# Patient Record
Sex: Male | Born: 1950 | Race: White | Hispanic: No | Marital: Married | Smoking: Never smoker
Health system: Southern US, Community
[De-identification: ages and names within clinical notes are randomized; demographics above are authoritative.]

## PROBLEM LIST (undated history)

## (undated) DIAGNOSIS — K4090 Unilateral inguinal hernia, without obstruction or gangrene, not specified as recurrent: Secondary | ICD-10-CM

## (undated) DIAGNOSIS — E785 Hyperlipidemia, unspecified: Secondary | ICD-10-CM

## (undated) DIAGNOSIS — G4730 Sleep apnea, unspecified: Secondary | ICD-10-CM

## (undated) DIAGNOSIS — I10 Essential (primary) hypertension: Secondary | ICD-10-CM

## (undated) DIAGNOSIS — M1990 Unspecified osteoarthritis, unspecified site: Secondary | ICD-10-CM

## (undated) DIAGNOSIS — Z87442 Personal history of urinary calculi: Secondary | ICD-10-CM

## (undated) HISTORY — PX: TONSILLECTOMY: SUR1361

## (undated) HISTORY — PX: KNEE ARTHROSCOPY: SUR90

## (undated) HISTORY — PX: SHOULDER ARTHROSCOPY: SHX128

## (undated) HISTORY — PX: COLONOSCOPY: SHX174

---

## 1998-02-27 ENCOUNTER — Ambulatory Visit (HOSPITAL_BASED_OUTPATIENT_CLINIC_OR_DEPARTMENT_OTHER): Admission: RE | Admit: 1998-02-27 | Discharge: 1998-02-27 | Payer: Self-pay | Admitting: Orthopedic Surgery

## 2000-01-30 ENCOUNTER — Encounter: Payer: Self-pay | Admitting: Urology

## 2000-01-30 ENCOUNTER — Encounter: Admission: RE | Admit: 2000-01-30 | Discharge: 2000-01-30 | Payer: Self-pay | Admitting: Urology

## 2000-02-26 ENCOUNTER — Encounter: Admission: RE | Admit: 2000-02-26 | Discharge: 2000-02-26 | Payer: Self-pay | Admitting: Urology

## 2000-02-26 ENCOUNTER — Encounter: Payer: Self-pay | Admitting: Urology

## 2000-02-28 ENCOUNTER — Ambulatory Visit (HOSPITAL_COMMUNITY): Admission: RE | Admit: 2000-02-28 | Discharge: 2000-02-28 | Payer: Self-pay | Admitting: Urology

## 2000-09-29 ENCOUNTER — Ambulatory Visit (HOSPITAL_BASED_OUTPATIENT_CLINIC_OR_DEPARTMENT_OTHER): Admission: RE | Admit: 2000-09-29 | Discharge: 2000-09-29 | Payer: Self-pay | Admitting: Orthopedic Surgery

## 2001-01-20 ENCOUNTER — Ambulatory Visit (HOSPITAL_BASED_OUTPATIENT_CLINIC_OR_DEPARTMENT_OTHER): Admission: RE | Admit: 2001-01-20 | Discharge: 2001-01-20 | Payer: Self-pay | Admitting: *Deleted

## 2001-05-05 ENCOUNTER — Encounter: Admission: RE | Admit: 2001-05-05 | Discharge: 2001-05-05 | Payer: Self-pay | Admitting: Family Medicine

## 2001-07-29 ENCOUNTER — Encounter: Payer: Self-pay | Admitting: Urology

## 2001-07-29 ENCOUNTER — Encounter: Admission: RE | Admit: 2001-07-29 | Discharge: 2001-07-29 | Payer: Self-pay | Admitting: Urology

## 2001-08-31 ENCOUNTER — Encounter: Payer: Self-pay | Admitting: Urology

## 2001-08-31 ENCOUNTER — Encounter: Admission: RE | Admit: 2001-08-31 | Discharge: 2001-08-31 | Payer: Self-pay | Admitting: Urology

## 2001-11-09 ENCOUNTER — Encounter: Admission: RE | Admit: 2001-11-09 | Discharge: 2001-11-09 | Payer: Self-pay | Admitting: Family Medicine

## 2003-10-04 ENCOUNTER — Encounter: Admission: RE | Admit: 2003-10-04 | Discharge: 2003-10-04 | Payer: Self-pay | Admitting: Sports Medicine

## 2003-11-23 ENCOUNTER — Encounter: Admission: RE | Admit: 2003-11-23 | Discharge: 2003-11-23 | Payer: Self-pay | Admitting: Family Medicine

## 2004-06-20 ENCOUNTER — Ambulatory Visit (HOSPITAL_COMMUNITY): Admission: RE | Admit: 2004-06-20 | Discharge: 2004-06-20 | Payer: Self-pay | Admitting: Family Medicine

## 2005-03-12 ENCOUNTER — Ambulatory Visit (HOSPITAL_COMMUNITY): Admission: RE | Admit: 2005-03-12 | Discharge: 2005-03-12 | Payer: Self-pay | Admitting: Gastroenterology

## 2006-11-28 ENCOUNTER — Encounter: Payer: Self-pay | Admitting: Family Medicine

## 2006-11-28 LAB — CONVERTED CEMR LAB
ALT: 21 units/L (ref 0–53)
AST: 23 units/L (ref 0–37)
Albumin: 4.4 g/dL (ref 3.5–5.2)
Alkaline Phosphatase: 73 units/L (ref 39–117)
BUN: 19 mg/dL (ref 6–23)
CO2: 27 meq/L (ref 19–32)
Calcium: 9.3 mg/dL (ref 8.4–10.5)
Chloride: 102 meq/L (ref 96–112)
Cholesterol: 146 mg/dL (ref 0–200)
Creatinine, Ser: 0.9 mg/dL (ref 0.40–1.50)
Glucose, Bld: 91 mg/dL (ref 70–99)
HDL: 47 mg/dL (ref >39)
LDL Cholesterol: 86 mg/dL (ref 0–99)
PSA: 0.44 ng/mL (ref 0.10–4.00)
Potassium: 4.1 meq/L (ref 3.5–5.3)
Sodium: 139 meq/L (ref 135–145)
TSH: 1.282 microintl units/mL (ref 0.350–5.50)
Total Bilirubin: 0.8 mg/dL (ref 0.3–1.2)
Total CHOL/HDL Ratio: 3.1
Total Protein: 7.3 g/dL (ref 6.0–8.3)
Triglycerides: 65 mg/dL (ref <150)
VLDL: 13 mg/dL (ref 0–40)

## 2007-04-06 ENCOUNTER — Encounter: Payer: Self-pay | Admitting: Family Medicine

## 2007-04-06 LAB — CONVERTED CEMR LAB
AST: 26 units/L (ref 0–37)
Albumin: 4.1 g/dL (ref 3.5–5.2)
Alkaline Phosphatase: 73 units/L (ref 39–117)
BUN: 14 mg/dL (ref 6–23)
Calcium: 9.3 mg/dL (ref 8.4–10.5)
Creatinine, Ser: 0.96 mg/dL (ref 0.40–1.50)
Glucose, Bld: 105 mg/dL — ABNORMAL HIGH (ref 70–99)
HDL: 49 mg/dL (ref >39)
LDL Cholesterol: 79 mg/dL (ref 0–99)
Potassium: 4.4 meq/L (ref 3.5–5.3)
Total CHOL/HDL Ratio: 3.1
Triglycerides: 109 mg/dL (ref <150)

## 2007-10-01 ENCOUNTER — Encounter: Payer: Self-pay | Admitting: Family Medicine

## 2007-10-01 LAB — CONVERTED CEMR LAB
ALT: 16 units/L (ref 0–53)
AST: 21 units/L (ref 0–37)
Alkaline Phosphatase: 70 units/L (ref 39–117)
Basophils Absolute: 0.1 10*3/uL (ref 0.0–0.1)
Basophils Relative: 1 % (ref 0–1)
Cholesterol: 137 mg/dL (ref 0–200)
Creatinine, Ser: 0.9 mg/dL (ref 0.40–1.50)
Eosinophils Relative: 2 % (ref 0–5)
HCT: 47.4 % (ref 39.0–52.0)
Hemoglobin: 15.3 g/dL (ref 13.0–17.0)
LDL Cholesterol: 76 mg/dL (ref 0–99)
MCHC: 32.3 g/dL (ref 30.0–36.0)
Monocytes Absolute: 0.8 10*3/uL (ref 0.1–1.0)
PSA: 0.52 ng/mL (ref 0.10–4.00)
Platelets: 223 10*3/uL (ref 150–400)
RDW: 13.2 % (ref 11.5–15.5)
Sodium: 143 meq/L (ref 135–145)
TSH: 1.351 microintl units/mL (ref 0.350–5.50)
Total Bilirubin: 0.8 mg/dL (ref 0.3–1.2)
Total CHOL/HDL Ratio: 3
VLDL: 16 mg/dL (ref 0–40)

## 2007-11-08 ENCOUNTER — Ambulatory Visit (HOSPITAL_BASED_OUTPATIENT_CLINIC_OR_DEPARTMENT_OTHER): Admission: RE | Admit: 2007-11-08 | Discharge: 2007-11-08 | Payer: Self-pay | Admitting: Internal Medicine

## 2007-11-15 ENCOUNTER — Ambulatory Visit: Payer: Self-pay | Admitting: Internal Medicine

## 2007-11-20 ENCOUNTER — Encounter: Payer: Self-pay | Admitting: Family Medicine

## 2007-11-20 LAB — CONVERTED CEMR LAB
Albumin: 4.5 g/dL (ref 3.5–5.2)
Alkaline Phosphatase: 79 units/L (ref 39–117)
Total Protein: 7.4 g/dL (ref 6.0–8.3)

## 2007-12-23 ENCOUNTER — Ambulatory Visit (HOSPITAL_BASED_OUTPATIENT_CLINIC_OR_DEPARTMENT_OTHER): Admission: RE | Admit: 2007-12-23 | Discharge: 2007-12-23 | Payer: Self-pay | Admitting: Internal Medicine

## 2007-12-26 ENCOUNTER — Ambulatory Visit: Payer: Self-pay | Admitting: Internal Medicine

## 2007-12-28 ENCOUNTER — Encounter: Payer: Self-pay | Admitting: Family Medicine

## 2009-02-23 ENCOUNTER — Ambulatory Visit (HOSPITAL_BASED_OUTPATIENT_CLINIC_OR_DEPARTMENT_OTHER): Admission: RE | Admit: 2009-02-23 | Discharge: 2009-02-23 | Payer: Self-pay | Admitting: Urology

## 2009-02-27 ENCOUNTER — Encounter: Payer: Self-pay | Admitting: Family Medicine

## 2009-02-27 LAB — CONVERTED CEMR LAB
Chloride: 106 meq/L (ref 96–112)
Creatinine, Ser: 0.94 mg/dL (ref 0.40–1.50)
GFR calc non Af Amer: >60 mL/min (ref >60)
Sodium: 140 meq/L (ref 135–145)

## 2010-06-26 ENCOUNTER — Ambulatory Visit: Payer: Self-pay | Admitting: Sports Medicine

## 2010-06-26 ENCOUNTER — Ambulatory Visit: Payer: Self-pay | Admitting: Family Medicine

## 2010-06-26 DIAGNOSIS — R5383 Other fatigue: Secondary | ICD-10-CM

## 2010-06-26 DIAGNOSIS — M5137 Other intervertebral disc degeneration, lumbosacral region: Secondary | ICD-10-CM

## 2010-06-26 DIAGNOSIS — R5381 Other malaise: Secondary | ICD-10-CM

## 2010-06-26 DIAGNOSIS — M79609 Pain in unspecified limb: Secondary | ICD-10-CM

## 2010-07-03 LAB — CONVERTED CEMR LAB
CO2: 26 meq/L (ref 19–32)
Calcium: 9.4 mg/dL (ref 8.4–10.5)
Sodium: 142 meq/L (ref 135–145)
Total CK: 162 units/L (ref 7–232)

## 2010-08-07 ENCOUNTER — Ambulatory Visit: Payer: Self-pay | Admitting: Sports Medicine

## 2010-08-07 DIAGNOSIS — Q667 Congenital pes cavus, unspecified foot: Secondary | ICD-10-CM | POA: Insufficient documentation

## 2010-12-18 NOTE — Assessment & Plan Note (Signed)
Summary: 9:30 APPT,B LEG WEAKNESS PER [facility]   Vital Signs:  Patient profile:   60 year old male Height:      70 inches Weight:      200 pounds BMI:     28.80 BP sitting:   146 / 84  CC:  Leg Weakness.Marland Kitchen  History of Present Illness: 60 yo M:  1. Leg Weakness: generalized, vague, bilateral upper leg weakness, worsening over past several months. + PMHx of lumbar DDD/pain. Reports doing abdominal crunches about 6-8 months ago, then had radicular pain down right leg - now resolved. Now feels bilateral leg weakness that is worse when he stands up from a seated position. No knee, low back, or hip pain now. States that he is on Zocor 40 mg by mouth daily x 15 years. Wonders if it is the etiology.  Current Medications (verified): 1)  Epipen 2-Pak 0.3 Mg/0.81ml (1:1000)  Devi (Epinephrine Hcl (Anaphylaxis)) .... Inject As Soon As Possible After Sting 2)  Simvastatin 40 Mg Tabs (Simvastatin) .... Take One Tablet At Bedtime 3)  Aspirin 81 Mg  Tbec (Aspirin) .... One By Mouth Every Day  Allergies (verified): No Known Drug Allergies  Review of Systems GU:  Denies incontinence. MS:  Complains of loss of strength, low back pain, and muscle weakness; denies joint pain, joint redness, and joint swelling. Neuro:  Denies falling down, numbness, and tingling.  Physical Exam  General:  Well-developed, well-nourished, in no acute distress; alert, appropriate and cooperative throughout examination. Vitals reviewed. Msk:  Back: No obvious abnormalities or asymmetry. No point tenderness to lumbar spine, paraspinal mm, hips, piriformis. Normal lumbar and hip ROM. Neg SLR. Neg FABER. WEAK hip flexion and abduction left >> right. Decreased patellar tendon reflex on left. Knees: No effusion. No point tenderness. Normal ROM. Neg McMurrays. Normal endpoints.  Neurologic:  Sensation intact to light touch and gait normal.     Impression & Recommendations:  Problem # 1:  DEGENERATIVE DISC DISEASE, LUMBAR  SPINE (ICD-722.52) Assessment New Will continue on easy LB stretches and exercises  if no response could consider gabapentin  Problem # 2:  WEAKNESS (ICD-780.79) Assessment: New Especially hip flexion and abduction - starting to interfere with lifestyle. Likely 2/2 to DDD flare with radiculopathy after crunches 6-8 months ago which led the patient to rest from core exercises. Patient weaker on the side of previous radicular pain. Will Rx PT for weakness. No RED FLAGs today indicating need for further imaging. Will check electrolytes and CK (unlikely that Zocor contributing to issue). Follow up in 6-8 weeks for re-eval.  Problem # 3:  LEG PAIN, BILATERAL (ICD-729.5)  Orders: Basic Met-[facility] ([address]) CK (Creatine Kinase)-[facility] [phone])  This does seem referred from back area but is much more significant left RT may be f compensation  Complete Medication List: 1)  Epipen 2-pak 0.3 Mg/0.75ml (1:1000) Devi (Epinephrine hcl (anaphylaxis)) .... Inject as soon as possible after sting 2)  Simvastatin 40 Mg Tabs (Simvastatin) .... Take one tablet at bedtime 3)  Aspirin 81 Mg Tbec (Aspirin) .... One by mouth every day Prescriptions: SIMVASTATIN 40 MG TABS (SIMVASTATIN) Take one tablet at bedtime  #90 x 3   Entered by:   Helane Rima DO   Authorized by:   Enid Baas MD   Signed by:   Helane Rima DO on 06/26/2010   Method used:   Historical   RxID:   [id number]

## 2010-12-18 NOTE — Assessment & Plan Note (Signed)
Summary: F/U,MC   Vital Signs:  Patient profile:   60 year old male Pulse rate:   71 / minute BP sitting:   150 / 70  (left arm)  Vitals Entered By: Lillia Pauls CMA (August 07, 2010 8:41 AM) CC: f/u L knee weakness   CC:  f/u L knee weakness.  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  note he had RT arch pain p lot of step up exercises  Allergies: No Known Drug Allergies  Physical Exam  General:  Well-developed,well-nourished,in no acute distress; alert,appropriate and cooperative throughout examination Msk:  Cavus foot wi lots of callus bilat  neg SLR and Low back exam  left hip abduction, flexion and rotation strength is now 90 to 100% of RT side  this is big improvement   Impression & Recommendations:  Problem # 1:  DEGENERATIVE DISC DISEASE, LUMBAR SPINE (ICD-722.52) keep up back stretches and particularly 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  Problem # 2:  WEAKNESS (ICD-780.79) much improved  would keep up exercises at least 3x week should see steady drop in sxs next 2 mos if not we will reck  Problem # 3:  LEG PAIN, BILATERAL (ICD-729.5)  this is less and really has resolved on RT  Orders: Sports Insoles (L3510)  Problem # 4:  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 Inject as soon as possible after sting 2)  Simvastatin 40 Mg Tabs (Simvastatin) .... Take one tablet at  bedtime 3)  Aspirin 81 Mg Tbec (Aspirin) .... One by mouth every day  Patient Instructions: 1)  Try to keep up some hip and step exercises at least 3 x per week 2)  add some knee to chest and knee to opposite shoulder stretches - try 3 and hold for 15 to 30 secs 3)  this is triggered by low back degenerative change but you don't have any severe compression as strength is now back to normal 4)  avoid knee bend greater than 90 deg while working as this irritates cartilage 5)  recheck with me as needed but expect change over the next 2 mosnths

## 2010-12-18 NOTE — Letter (Signed)
Summary: Generic Letter  [facility]  [address]   [place], [place] [address]   Phone: [phone]  Fax: [phone]    08/07/2010 Johnella Moloney, MD Fax [phone]  Dear Duane Greene:  I recently saw your patient:  Duane Greene [address] [place], [place]  [address]  I have seen [name] a number of times over past 20 years for musculoskeletal injuries.  He came to me with some back and left leg pain.  Attached are his office visits.         Sincerely,  Vincent Gros MD

## 2011-04-02 NOTE — Procedures (Signed)
NAME:  Duane Greene, Duane Greene                ACCOUNT NO.:  [id number]   MEDICAL RECORD NO.:  [id number]          PATIENT TYPE:  OUT   LOCATION:  SLEEP CENTER                 FACILITY:  [facility]   PHYSICIAN:  Clinton D. Maple Hudson, MD, FCCP, FACPDATE OF BIRTH:  March 06[birth year] 1950   DATE OF STUDY:  12/23/2007                            NOCTURNAL POLYSOMNOGRAM   REFERRING PHYSICIAN:  Candyce Churn, M.D.   INDICATION FOR STUDY:  Hypersomnia with sleep apnea.   Epworth sleepiness score 13/24.  BMI 30.6.  Weight 207 pounds.  Height  69 inches.  Neck 15.5 inches.   HOME MEDICATIONS:  Charted and reviewed.   The baseline diagnostic NPSG was done November 08, 2007, recording an  AHI of 23.6 per hour.  CPAP titration is requested.   SLEEP ARCHITECTURE:  Total sleep time 312 minutes with sleep efficiency  77%.  Stage 1 was 10.6%, stage 2 was 70.2%, stage 3 absent, REM 19.2% of  total sleep time.  Sleep latency 7 minutes, REM latency 47.5 minutes.  Awake after sleep onset 77 minutes.  Arousal index 9.2.  No bedtime  medication was taken.   RESPIRATORY DATA:  CPAP titration protocol.  CPAP was titrated to 7 CWP,  AHI 0 per hour.  He chose a small Respironics ComfortGel nasal mask with  heated humidifier.   OXYGEN DATA:  Snoring was prevented by CPAP and oxygen saturation held  at a mean of 94.9% on room air.   CARDIAC DATA:  Sinus rhythm with occasional PVC.   MOVEMENT/PARASOMNIA:  No significant movement disturbance.  Bathroom x1.   IMPRESSION/RECOMMENDATION:  1. Successful CPAP titration to 7 CWP, AHI 0 per hour.  He chose a      small Respironics ComfortGel nasal mask with heated humidifier.  2. Baseline diagnostic nocturnal polysomnogram on November 08, 2007      recorded an AHI of 23.6 per hour.     Clinton D. Maple Hudson, MD, [facility], FACP  Diplomate, Biomedical engineer of Sleep Medicine  Electronically Signed    CDY/MEDQ  D:  12/26/2007 17:32:09  T:  12/28/2007 11:01:29  Job:  [id number]

## 2011-04-02 NOTE — Procedures (Signed)
NAME:  Duane Greene, Duane Greene                ACCOUNT NO.:  [id number]   MEDICAL RECORD NO.:  [id number]          PATIENT TYPE:  OUT   LOCATION:  SLEEP CENTER                 FACILITY:  [facility]   PHYSICIAN:  Clinton D. Maple Hudson, MD, FCCP, FACPDATE OF BIRTH:  [birth year] 1950   DATE OF STUDY:  11/08/2007                            NOCTURNAL POLYSOMNOGRAM   REFERRING PHYSICIAN:  Candyce Churn, M.D.   INDICATION FOR STUDY:  Hypersomnia with sleep apnea.  BMI 30.6, weight  207 pounds, height 69 inches.   EPWORTH SLEEPINESS SCORE:  10/24   MEDICATIONS:  Home medication charted and reviewed.   SLEEP ARCHITECTURE:  Total sleep time 325 minutes with sleep efficiency  74%.  Stage I was 12%, stage II 78%, and stage III absent, REM 8% of  total sleep time.  Sleep latency 9 minutes, REM latency 105 minutes.  Awake after sleep onset 104 minutes.  Arousal index 9.6.  No bedtime  medication was reported.   RESPIRATORY DATA:  Diagnostic NPSG protocol.  Apnea/hypopnea index (AHI)  23.6 per hour, RDI 25.1 per hour.  There were a total of 128 obstructive  events including one obstructive apnea and 127 hypopneas.  Most events  were recorded while sleeping on sides.   OXYGEN DATA:  Loud snoring with oxygen desaturation to a nadir of 73%.  Mean oxygen saturation through the study was 92.8% on room air.  Oxygen  saturation was less than 88% for a total of 36 minutes.   CARDIAC DATA:  Normal sinus rhythm with occasional dropped beat.   MOVEMENT-PARASOMNIA:  Minimal periodic limb movement with a total of 8  events, index 1.5 arousals per hour.  One bathroom trip.   IMPRESSIONS-RECOMMENDATIONS:  1. Moderate obstructive sleep apnea/hypopnea syndrome, apnea/hypopnea      index 23.6 per hour (normal range 0-5 per hour).  Events were      mostly recorded while sleeping on sides.  Loud snoring with oxygen      desaturation to a nadir of 73%.  2. Minimal periodic limb movement with arousal, 1.5 per hour.  3. Consider  return for CPAP titration or evaluate for alternative      therapies for sleep apnea as appropriate.      Clinton D. Maple Hudson, MD, [facility], FACP  Diplomate, Biomedical engineer of Sleep Medicine  Electronically Signed     CDY/MEDQ  D:  11/15/2007 11:16:02  T:  11/15/2007 22:43:41  Job:  [id number]

## 2011-04-02 NOTE — Op Note (Signed)
NAME:  Duane Greene, Duane Greene                ACCOUNT NO.:  [id number]   MEDICAL RECORD NO.:  [id number]          PATIENT TYPE:  AMB   LOCATION:  [facility]                         FACILITY:  [facility]   PHYSICIAN:  Sigmund I. Patsi Sears, M.D.DATE OF BIRTH:  [birth year]   DATE OF PROCEDURE:  DATE OF DISCHARGE:                               OPERATIVE REPORT   PREOPERATIVE DIAGNOSIS:  Bladder stone.   POSTOPERATIVE DIAGNOSES:  1. Urethral stricture.  2. Bladder stone.   PROCEDURE PERFORMED:  1. Cystourethroscopy.  2. Urethral dilatation.  3. Cystolitholapaxy using holmium laser.   SURGEON:  Sigmund I. Patsi Sears, M.D.   RESIDENTWynonia Lawman   ANESTHESIA:  General.   DRAINS:  None.   COMPLICATIONS:  None.   INDICATIONS FOR PROCEDURE:  The patient , a 60 year old gentleman with a  history of nephrolithiasis, was recently evaluated with a CT stone study  for abdominal pain and was found to have a 12 x 8 mm bladder stone.  Definite treatment options were discussed.  The patient today was  scheduled for cystolitholapaxy.  The risks and benefits of procedure  were explained.  Informed consent obtained.   DESCRIPTION OF PROCEDURE IN DETAIL:  The patient was brought to the  operating room, placed in the supine position.  Administered general  anesthesia by the anesthesia team.  Proper time out performed __________  patient, procedure, and the site.  The patient was given appropriate  preoperative antibiotics.  The patient was subsequently placed in the  dorsal lithotomy position.  Pressure points were well padded.  The  patient was prepped and draped in the usual sterile manner.  Using the  21-French ACMI cystoscopic sheath and a 12-degree lens, we then  performed cystourethroscopy.  The patient's anterior urethra was normal  up until the junction of bulbar urethra with the membranous urethra.  Just distal to the membranous urethra, there was a slight narrowing seen  in the urethra.  We were  able to negotiate through this narrowing with  the help of cystoscopic sheath.  We then entered the prostatic urethra  which revealed moderate bilobar prostatic enlargement.  Upon entering  the bladder, we saw the stone at the base of the bladder.  It was  mulberry in appearance.  The left ureteric orifice was identified and in  its normal anatomic position.  In the place of the right ureteric  orifice, there was edematous heaped-up  mucosa.  At that point, we gave  the patient indigo carmine.  We also introduced 500 micron laser fiber  and performed laser lithotripsy of the stone.  The stone was broken down  to tiny particles.  These were evacuated using cystoscopic sheath and  Toomey syringe.  We reexamined the bladder with the help of 30-degree  lens.  We saw blue urine seen  effluxing the left and the right ureteric orifice.  We then made the  patient's bladder to empty.  There were no more stone particles.  We  removed the cystoscope.  This marked the end of the procedure.  The  patient was subsequently extubated and transferred in  stable condition  to the recovery room.     ______________________________  Dr. Georgeanna Lea      Sigmund I. Patsi Sears, M.D.  Electronically Signed    JJ/MEDQ  D:  02/23/2009  T:  02/23/2009  Job:  [id number]

## 2011-04-05 NOTE — Op Note (Signed)
[place]  Patient:    Duane Greene, Duane Greene                       MRN: [id number] Proc. Date: 01/20/01 Adm. 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  PROCEDURE:  Left shoulder EUA followed by manipulation and cortisone injection.  SURGEON:  Elana Alm. Thurston Hole, M.D.  ANESTHESIA:  General.  OPERATIVE TIME:  10 minutes.  COMPLICATIONS:  None.  INDICATIONS FOR PROCEDURE:  Mr. Duane Greene is a 60 year old gentleman who is three and a half months status post left shoulder arthroscopy with decompression, who has developed significant adhesive capsulitis despite aggressive physical therapy. Because of this, he is now to undergo examination under anesthesia, as well as manipulation and injection.  DESCRIPTION OF PROCEDURE:  Mr. Duane Greene was brought to the operating room on January 20, 2001 after a block had been placed in the holding room. After being placed under general anesthesia his shoulder was examined. He had forward flexion of 170, abduction of 110, internal rotation of 50, external rotation of 50. A gentle manipulation was carried out breaking up adhesions, especially in external rotation and abduction, such that he regained full abduction to 175, full external rotation to 90, internal rotation to 80 and forward flexion to 180. His shoulder remained stable ligamentous exam. After this was done intraoperative fluoroscopy confirmed that there was no bony injury and his subacromial space appeared satisfactory. Also, noting the [facility] joint area, this appeared to have had a satisfactory distal clavicle excision, which had performed at the time of his surgery three months ago. At this point, then, through using an  alcohol prep the subacromial space was injected with 1 cc of Depo-Medrol and 3 cc of Marcaine. Then the intra-articular portion of the joint was also injected with 1 cc of Depo-Medrol and 3 cc of Marcaine. After this was done, the patient was wakened and taken to the recovery room in stable condition.  FOLLOWUP CARE:  Mr. Duane Greene will be followed as an outpatient on Vicodin for pain. He will start immediate physical therapy tomorrow, intensive therapy. See him back in my office in a week for repeat evaluation. DD:  01/20/01 TD:  01/20/01 Job: [id number]

## 2011-04-05 NOTE — H&P (Signed)
[place]  Patient:    Duane Greene, Duane Greene                       MRN: [id number] Adm. Date:  [id number] Attending:  Twana First                         History and Physical  PREOPERATIVE DIAGNOSIS:  Left shoulder partial rotator cuff tear with tendonitis and impingement.  Left shoulder AC joint arthropathy.  POSTOPERATIVE DIAGNOSIS:  Left shoulder rotator cuff tendonitis and impingment.  Left shoulder partial labrum tear.  Left shoulder AC joint arthropathy.  PROCEDURE:  Left shoulder EUA followed by arthroscopic partial labrum tear debridement.  Left shoulder subacromial decompression.  Left shoulder distal clavicle excision.  SURGEON:  Elana Alm. Thurston Hole, M.D.  ASSISTANT:  Kirstin Adelberger, P.A.  ANESTHESIA:  General anesthesia.  OPERATIVE TIME:  45 minutes.  COMPLICATIONS:  None.  INDICATIONS:  Duane Greene is a 60 year old gentleman who has had significant left shoulder pain for the past six months increasing in nature with signs and symptoms consistent with rotator cuff tendonitis and impingement who has failed conservative care and is now to undergo arthroscopy.  DESCRIPTION OF PROCEDURE:  Duane Greene was brought to the operating room on September 29, 2000, and placed on the operating table in the supine position. After an adequate level of general anesthesia was obtained, his left shoulder was examined under anesthesia.  He had full range of motion.  His shoulder was stable ligamentous examination.  Subacromial space was sterilely injected with 0.25% Marcaine with epinephrine.  He was then placed in a beachchair position and his shoulder and arm was prepped using sterile Betadine and draped using sterile technique.  Originally through a posterior arthroscopic portal, the arthroscope with the pump attached was placed and through an anterior portal an arthroscopic probe was placed.  On initial inspection the articular cartilage and  the glenohumeral joint showed mild grade I and II changes. Anterior labrum superiorly in the midportion showed significant degenerative type tearing of which 30 to 40% was resected back to a stable rim.  The biceps tendon anchor was found to be intact, biceps tendon intact, inferior labrum, and anterior and inferior glenohumeral ligaments were intact.  Posterior labrum showed moderate fraying which was debrided and otherwise it was intact. The rotator cuff was thoroughly inspected, but there was no evidence of a tear. Inferior capsular recess was free of pathology.  Subacromial space was entered and a lateral arthroscopic portal was made.  Moderately thickened bursitis was resected.  After this was done, the rotator cuff was found to be frayed, but no evidence of a tear.  Subacromial decompression was carried out removing 6 to 8 mm of the undersurface of the anterior, anterolateral, and anterior medial acromion and the CA ligament release carried out.  AC joint was exposed.  Significant spurring and degenerative changes noted in the distal 5 mm of the distal clavicle was resected with a 6 mm bur under arthroscopic control and the inferior spur was totally removed.  After this was done, the shoulder could be brought through a full range of motion with no impingement on the rotator cuff.  At this point, it was felt that all pathology had been satisfactorily addressed.  The instruments were removed. Portals were closed with 3-0 nylon suture and injected with 0.25% Marcaine with epinephrine.  Sterile dressings and a sling applied and the  patient awakened and taken to the recovery room in stable condition.  FOLLOW-UP:  Duane Greene will be followed as an outpatient on Vicodin and Naprosyn.  Begin early physical therapy.  See him back in the office in a week for sutures out and follow-up. DD:  09/29/00 TD:  09/29/00 Job: [id number]

## 2011-04-22 ENCOUNTER — Other Ambulatory Visit: Payer: Self-pay | Admitting: Family Medicine

## 2011-04-22 DIAGNOSIS — Z91030 Bee allergy status: Secondary | ICD-10-CM

## 2011-04-22 MED ORDER — EPINEPHRINE 0.3 MG/0.3ML IJ DEVI: 0.3000 mg | Freq: Once | INTRAMUSCULAR | Status: DC

## 2012-05-07 ENCOUNTER — Other Ambulatory Visit: Payer: Self-pay | Admitting: Family Medicine

## 2012-05-07 DIAGNOSIS — Z91030 Bee allergy status: Secondary | ICD-10-CM

## 2012-05-07 MED ORDER — EPINEPHRINE 0.3 MG/0.3ML IJ DEVI: 0.3000 mg | Freq: Once | INTRAMUSCULAR | Status: DC

## 2012-08-21 ENCOUNTER — Ambulatory Visit (INDEPENDENT_AMBULATORY_CARE_PROVIDER_SITE_OTHER): Payer: 59 | Admitting: Family Medicine

## 2012-08-21 VITALS — BP 152/78 | HR 72

## 2012-08-21 DIAGNOSIS — H8110 Benign paroxysmal vertigo, unspecified ear: Secondary | ICD-10-CM | POA: Insufficient documentation

## 2012-08-21 MED ORDER — MECLIZINE HCL 25 MG PO CHEW: 25.0000 mg | CHEWABLE_TABLET | Freq: Four times a day (QID) | ORAL | Status: DC | PRN

## 2012-08-21 MED ORDER — MECLIZINE HCL 25 MG PO TABS: 25.0000 mg | ORAL_TABLET | Freq: Three times a day (TID) | ORAL | Status: DC | PRN

## 2012-08-21 NOTE — Progress Notes (Signed)
  Subjective:    Patient ID: Duane Greene, male    DOB: 1951-09-30, 61 y.o.   MRN: [id number]  HPI 15 minutes of dizziness yesterday AM without nausea. Fine the rest of the day. This AM recurred with diaphoresis worse with head movements. No nausea or vomiting. EMS came and did an EKG that they said was normal. After sitting for 15 min has been able to get up and ambulate. Now feels tired and sleepy.   Review of Systems  Respiratory: Negative for cough, chest tightness and shortness of breath.   Cardiovascular: Negative for chest pain and leg swelling.  Gastrointestinal: Negative for nausea.       Objective:   Physical Exam  Cardiovascular: Normal rate and regular rhythm.   No murmur heard. Pulmonary/Chest: Effort normal and breath sounds normal. No respiratory distress.  Neurological: He is alert. No cranial nerve deficit. Coordination normal.       Rotary nystagmus when he lay back with the head turned to the left but not to the right. No improvement after the first Epley manuever  Psychiatric: He has a normal mood and affect. His behavior is normal.          Assessment & Plan:

## 2012-08-21 NOTE — Patient Instructions (Addendum)
Avoid any activity requiring good balance for a few days. Take the Meclizine for a couple days to minimize the symptoms. Do the exercises if the vertigo persists. Return if you develop headache or weakness or other new symptoms.    Benign Positional Vertigo Vertigo means you feel like you or your surroundings are moving when they are not. Benign positional vertigo is the most common form of vertigo. Benign means that the cause of your condition is not serious. Benign positional vertigo is more common in older adults. CAUSES  Benign positional vertigo is the result of an upset in the labyrinth system. This is an area in the middle ear that helps control your balance. This may be caused by a viral infection, head injury, or repetitive motion. However, often no specific cause is found. SYMPTOMS  Symptoms of benign positional vertigo occur when you move your head or eyes in different directions. Some of the symptoms may include:  Loss of balance and falls.  Vomiting.  Blurred vision.  Dizziness.  Nausea.  Involuntary eye movements (nystagmus). DIAGNOSIS  Benign positional vertigo is usually diagnosed by physical exam. If the specific cause of your benign positional vertigo is unknown, your caregiver may perform imaging tests, such as magnetic resonance imaging (MRI) or computed tomography (CT). TREATMENT  Your caregiver may recommend movements or procedures to correct the benign positional vertigo. Medicines such as meclizine, benzodiazepines, and medicines for nausea may be used to treat your symptoms. In rare cases, if your symptoms are caused by certain conditions that affect the inner ear, you may need surgery. HOME CARE INSTRUCTIONS   Follow your caregiver's instructions.  Move slowly. Do not make sudden body or head movements.  Avoid driving.  Avoid operating heavy machinery.  Avoid performing any tasks that would be dangerous to you or others during a vertigo episode.  Drink  enough fluids to keep your urine clear or pale yellow. SEEK IMMEDIATE MEDICAL CARE IF:   You develop problems with walking, weakness, numbness, or using your arms, hands, or legs.  You have difficulty speaking.  You develop severe headaches.  Your nausea or vomiting continues or gets worse.  You develop visual changes.  Your family or friends notice any behavioral changes.  Your condition gets worse.  You have a fever.  You develop a stiff neck or sensitivity to light. MAKE SURE YOU:   Understand these instructions.  Will watch your condition.  Will get help right away if you are not doing well or get worse. Document Released: 08/12/2006 Document Revised: 01/27/2012 Document Reviewed: 07/25/2011 [facility] Patient Information 2013 [place], [place].

## 2012-08-21 NOTE — Assessment & Plan Note (Signed)
Not improved after the Epley maneuver x 1 so it was repeated.

## 2015-11-28 MED FILL — TERBINAFINE HCL 250 MG TAB: 250 | 84 days supply | Qty: 12 | Fill #1

## 2015-11-29 MED FILL — SIMVASTATIN 80 MG TABLET: 80 | 90 days supply | Qty: 90 | Fill #1

## 2015-12-07 DIAGNOSIS — L0390 Cellulitis, unspecified: Secondary | ICD-10-CM | POA: Diagnosis not present

## 2015-12-07 MED FILL — rifAMPin 300 MG CAPS: 300 | 14 days supply | Qty: 28 | Fill #0

## 2015-12-07 MED FILL — SULFAMETHOXAZOLE/TMP DS TAB: 800-160 | 14 days supply | Qty: 28 | Fill #0

## 2016-02-15 MED FILL — TERBINAFINE HCL 250 MG TAB: 250 | 84 days supply | Qty: 12 | Fill #0

## 2016-03-12 DIAGNOSIS — H52203 Unspecified astigmatism, bilateral: Secondary | ICD-10-CM | POA: Diagnosis not present

## 2016-03-12 DIAGNOSIS — H4720 Unspecified optic atrophy: Secondary | ICD-10-CM | POA: Diagnosis not present

## 2016-03-12 DIAGNOSIS — H2513 Age-related nuclear cataract, bilateral: Secondary | ICD-10-CM | POA: Diagnosis not present

## 2016-03-26 DIAGNOSIS — L409 Psoriasis, unspecified: Secondary | ICD-10-CM | POA: Diagnosis not present

## 2016-03-26 DIAGNOSIS — I10 Essential (primary) hypertension: Secondary | ICD-10-CM | POA: Diagnosis not present

## 2016-03-26 DIAGNOSIS — N200 Calculus of kidney: Secondary | ICD-10-CM | POA: Diagnosis not present

## 2016-03-26 DIAGNOSIS — M545 Low back pain: Secondary | ICD-10-CM | POA: Diagnosis not present

## 2016-03-26 DIAGNOSIS — E559 Vitamin D deficiency, unspecified: Secondary | ICD-10-CM | POA: Diagnosis not present

## 2016-03-26 DIAGNOSIS — M179 Osteoarthritis of knee, unspecified: Secondary | ICD-10-CM | POA: Diagnosis not present

## 2016-03-26 DIAGNOSIS — Z13220 Encounter for screening for lipoid disorders: Secondary | ICD-10-CM | POA: Diagnosis not present

## 2016-03-26 DIAGNOSIS — Z23 Encounter for immunization: Secondary | ICD-10-CM | POA: Diagnosis not present

## 2016-03-26 DIAGNOSIS — E782 Mixed hyperlipidemia: Secondary | ICD-10-CM | POA: Diagnosis not present

## 2016-03-26 DIAGNOSIS — Z6829 Body mass index (BMI) 29.0-29.9, adult: Secondary | ICD-10-CM | POA: Diagnosis not present

## 2016-03-26 DIAGNOSIS — Z125 Encounter for screening for malignant neoplasm of prostate: Secondary | ICD-10-CM | POA: Diagnosis not present

## 2016-03-26 DIAGNOSIS — Z0001 Encounter for general adult medical examination with abnormal findings: Secondary | ICD-10-CM | POA: Diagnosis not present

## 2016-03-26 DIAGNOSIS — G4733 Obstructive sleep apnea (adult) (pediatric): Secondary | ICD-10-CM | POA: Diagnosis not present

## 2016-03-26 DIAGNOSIS — Z79899 Other long term (current) drug therapy: Secondary | ICD-10-CM | POA: Diagnosis not present

## 2016-03-26 DIAGNOSIS — G4701 Insomnia due to medical condition: Secondary | ICD-10-CM | POA: Diagnosis not present

## 2016-05-27 MED FILL — TERBINAFINE HCL 250 MG TAB: 250 | 84 days supply | Qty: 12 | Fill #1

## 2016-08-07 MED FILL — TERBINAFINE HCL 250 MG TAB: 250 | 84 days supply | Qty: 12 | Fill #0

## 2016-08-07 MED FILL — SIMVASTATIN 80 MG TABLET: 80 | 90 days supply | Qty: 90 | Fill #0

## 2016-10-29 ENCOUNTER — Encounter: Payer: Self-pay | Admitting: Sports Medicine

## 2016-10-29 ENCOUNTER — Ambulatory Visit (INDEPENDENT_AMBULATORY_CARE_PROVIDER_SITE_OTHER): Payer: PPO | Admitting: Sports Medicine

## 2016-10-29 DIAGNOSIS — R531 Weakness: Secondary | ICD-10-CM | POA: Diagnosis not present

## 2016-10-29 NOTE — Assessment & Plan Note (Signed)
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 OA also possibly contributing, especially given crepitus on exam, however does not explain absent reflex.  - Referred to physical therapy - F/u in 6 weeks to evaluate progress

## 2016-10-29 NOTE — Progress Notes (Signed)
   Subjective:    Patient ID: Duane Greene, male    DOB: 1951/05/12, 65 y.o.   MRN: [id number]  HPI  Patient presents with L knee weakness.   Was initially seen for L knee pain and weakness six years ago. Was given strengthening exercises to do at that time, with subsequent improvement. Returns today because weakness has progressively worsened in past 6 months. Says he has no strength in his L knee when trying to stand up after kneeling down on the ground. Also when bending his knee to put pants on, when getting into a car, and when walking after sitting for a while. Only minimal pain intermittently. Denies numbness, tingling. Denies locking, catching, or giving out. No issues on the R.    Review of Systems Endorses weakness. Denies numbness, tingling.     Objective:   Physical Exam  Constitutional: He is oriented to person, place, and time. He appears well-developed and well-nourished. No distress.  HENT:  Head: Normocephalic and atraumatic.  Pulmonary/Chest: Effort normal. No respiratory distress.  Musculoskeletal:  Lower extremities: No obvious malformations or asymmetry Bony crepitus felt under L patella; no tenderness to palpation Decreased L knee flexion compared to RLE 4/5 quad strength on L; 5/5 on R L - 0 patellar tendon reflex; R - 3+ patellar tendon reflex Trace Achilles reflex bilaterally   Neurological: He is alert and oriented to person, place, and time.  Psychiatric: He has a normal mood and affect. His behavior is normal.      Assessment & Plan:  L knee weakness Most likely caused by sciatica (L4) 2/2 known degenerative disc disease given absent patellar tendon reflex and quad weakness. OA also possibly contributing, especially given crepitus on exam, however does not explain absent reflex.  - Referred to physical therapy - F/u in 6 weeks to evaluate progress   Tarri AbernethyAbigail J Micajah Dennin, MD, MPH PGY-2 [facility] Pager [phone]  Patient seen and  evaluated with the resident. I agree with the above plan of care. Patient's left leg weakness appears to be originating from his lumbar spine. He has very little in the way of knee pain and I do not think that his knee osteoarthritis is responsible for his current symptoms. His physical exam shows definite weakness with resisted hip flexion and an absent patellar reflex on the left. This is a chronic condition. I would like to start with physical therapy to see if we can regain any strengthen this leg. Follow-up with me in 6-8 weeks for reevaluation. If he continues to have weakness despite physical therapy, we may need to get imaging of his lumbar spine. Patient is encouraged to call me with questions or concerns prior to his follow-up visit.

## 2016-11-13 MED FILL — TERBINAFINE HCL 250 MG TAB: 250 | 84 days supply | Qty: 12 | Fill #1

## 2016-11-14 DIAGNOSIS — M25562 Pain in left knee: Secondary | ICD-10-CM | POA: Diagnosis not present

## 2016-11-20 DIAGNOSIS — M5416 Radiculopathy, lumbar region: Secondary | ICD-10-CM | POA: Diagnosis not present

## 2016-11-20 DIAGNOSIS — M25562 Pain in left knee: Secondary | ICD-10-CM | POA: Diagnosis not present

## 2016-11-20 DIAGNOSIS — M545 Low back pain: Secondary | ICD-10-CM | POA: Diagnosis not present

## 2016-11-20 DIAGNOSIS — R531 Weakness: Secondary | ICD-10-CM | POA: Diagnosis not present

## 2016-11-25 DIAGNOSIS — M5416 Radiculopathy, lumbar region: Secondary | ICD-10-CM | POA: Diagnosis not present

## 2016-11-25 DIAGNOSIS — M25562 Pain in left knee: Secondary | ICD-10-CM | POA: Diagnosis not present

## 2016-11-25 DIAGNOSIS — M545 Low back pain: Secondary | ICD-10-CM | POA: Diagnosis not present

## 2016-11-25 DIAGNOSIS — R531 Weakness: Secondary | ICD-10-CM | POA: Diagnosis not present

## 2016-11-29 DIAGNOSIS — M545 Low back pain: Secondary | ICD-10-CM | POA: Diagnosis not present

## 2016-11-29 DIAGNOSIS — M25562 Pain in left knee: Secondary | ICD-10-CM | POA: Diagnosis not present

## 2016-11-29 DIAGNOSIS — R531 Weakness: Secondary | ICD-10-CM | POA: Diagnosis not present

## 2016-11-29 DIAGNOSIS — M5416 Radiculopathy, lumbar region: Secondary | ICD-10-CM | POA: Diagnosis not present

## 2016-12-02 DIAGNOSIS — M5416 Radiculopathy, lumbar region: Secondary | ICD-10-CM | POA: Diagnosis not present

## 2016-12-02 DIAGNOSIS — M25562 Pain in left knee: Secondary | ICD-10-CM | POA: Diagnosis not present

## 2016-12-02 DIAGNOSIS — M545 Low back pain: Secondary | ICD-10-CM | POA: Diagnosis not present

## 2016-12-02 DIAGNOSIS — R531 Weakness: Secondary | ICD-10-CM | POA: Diagnosis not present

## 2016-12-05 DIAGNOSIS — M25562 Pain in left knee: Secondary | ICD-10-CM | POA: Diagnosis not present

## 2016-12-05 DIAGNOSIS — M5416 Radiculopathy, lumbar region: Secondary | ICD-10-CM | POA: Diagnosis not present

## 2016-12-05 DIAGNOSIS — R531 Weakness: Secondary | ICD-10-CM | POA: Diagnosis not present

## 2016-12-05 DIAGNOSIS — M545 Low back pain: Secondary | ICD-10-CM | POA: Diagnosis not present

## 2016-12-09 DIAGNOSIS — M25562 Pain in left knee: Secondary | ICD-10-CM | POA: Diagnosis not present

## 2016-12-09 DIAGNOSIS — M545 Low back pain: Secondary | ICD-10-CM | POA: Diagnosis not present

## 2016-12-09 DIAGNOSIS — R531 Weakness: Secondary | ICD-10-CM | POA: Diagnosis not present

## 2016-12-09 DIAGNOSIS — M5416 Radiculopathy, lumbar region: Secondary | ICD-10-CM | POA: Diagnosis not present

## 2016-12-31 ENCOUNTER — Ambulatory Visit: Payer: PPO | Admitting: Sports Medicine

## 2016-12-31 DIAGNOSIS — M25562 Pain in left knee: Secondary | ICD-10-CM | POA: Diagnosis not present

## 2016-12-31 DIAGNOSIS — M5416 Radiculopathy, lumbar region: Secondary | ICD-10-CM | POA: Diagnosis not present

## 2016-12-31 DIAGNOSIS — R531 Weakness: Secondary | ICD-10-CM | POA: Diagnosis not present

## 2016-12-31 DIAGNOSIS — M545 Low back pain: Secondary | ICD-10-CM | POA: Diagnosis not present

## 2017-01-07 ENCOUNTER — Ambulatory Visit (INDEPENDENT_AMBULATORY_CARE_PROVIDER_SITE_OTHER): Payer: PPO | Admitting: Sports Medicine

## 2017-01-07 ENCOUNTER — Encounter: Payer: Self-pay | Admitting: Sports Medicine

## 2017-01-07 VITALS — BP 133/72 | HR 72 | Ht 70.0 in | Wt 200.0 lb

## 2017-01-07 DIAGNOSIS — R531 Weakness: Secondary | ICD-10-CM

## 2017-01-07 NOTE — Progress Notes (Signed)
   Subjective:    Patient ID: Duane Greene, male    DOB: 1951/06/20, 66 y.o.   MRN: [id number]  HPI [name] is a 66 yo M w/ PMH of DDD in for F/U on L. Leg weakness. Seen 1/18 and diagnosed w/ likely sciatica and recommended PT. After 6 wks of PT reports leg strength has gone from 1/10 to 6/10. He especially notes an improvement in getting up from a kneeling position. He was discharged from PT last week but continues to do exercises. Denies pain in knee or back today. Denies loss of sensation or tingling in leg.   Review of Systems ROS- as per HPI    Objective:   Physical Exam  General: well-appearing M in NAD, A&Ox3  MSK: - full/non-painful ROM to hip flexion/extension, knee flexion/extension - 4/5 strength to hip flexion on L. - 5/5 strength to hip extension, leg flexion/extension, plantarflexion/dorsiflexion  - 2+ patellar reflex R., absent on L. - nl anterior drawer/posterior drawer/McMurray, no laxity w/ varus/valgus stress of L. 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 Stressed the importance of continuing exercise routine to maintain strength. This is a chronic problem for this patient. As long as he continues to improve his strength and I thank we can avoid aggressive treatment. Follow-up as needed.

## 2017-01-13 MED FILL — SIMVASTATIN 80 MG TABLET: 80 | 90 days supply | Qty: 90 | Fill #1

## 2017-02-07 MED FILL — TERBINAFINE HCL 250 MG TAB: 250 | 84 days supply | Qty: 12 | Fill #0

## 2017-03-11 DIAGNOSIS — H2513 Age-related nuclear cataract, bilateral: Secondary | ICD-10-CM | POA: Diagnosis not present

## 2017-03-11 DIAGNOSIS — H5340 Unspecified visual field defects: Secondary | ICD-10-CM | POA: Diagnosis not present

## 2017-03-11 DIAGNOSIS — H5203 Hypermetropia, bilateral: Secondary | ICD-10-CM | POA: Diagnosis not present

## 2017-03-11 DIAGNOSIS — H4720 Unspecified optic atrophy: Secondary | ICD-10-CM | POA: Diagnosis not present

## 2017-03-25 ENCOUNTER — Other Ambulatory Visit: Payer: Self-pay | Admitting: Ophthalmology

## 2017-03-25 DIAGNOSIS — H4720 Unspecified optic atrophy: Secondary | ICD-10-CM

## 2017-04-28 ENCOUNTER — Other Ambulatory Visit: Payer: PPO

## 2017-04-30 MED FILL — TERBINAFINE HCL 250 MG TAB: 250 | 84 days supply | Qty: 12 | Fill #1

## 2017-05-05 ENCOUNTER — Ambulatory Visit
Admission: RE | Admit: 2017-05-05 | Discharge: 2017-05-05 | Disposition: A | Discharge status: Home / Self Care | Payer: PPO | Source: Ambulatory Visit | Attending: Ophthalmology | Admitting: Ophthalmology

## 2017-05-05 DIAGNOSIS — I10 Essential (primary) hypertension: Secondary | ICD-10-CM | POA: Diagnosis not present

## 2017-05-05 DIAGNOSIS — E782 Mixed hyperlipidemia: Secondary | ICD-10-CM | POA: Diagnosis not present

## 2017-05-05 DIAGNOSIS — H5340 Unspecified visual field defects: Secondary | ICD-10-CM | POA: Diagnosis not present

## 2017-05-05 DIAGNOSIS — H4720 Unspecified optic atrophy: Secondary | ICD-10-CM

## 2017-05-05 DIAGNOSIS — L409 Psoriasis, unspecified: Secondary | ICD-10-CM | POA: Diagnosis not present

## 2017-05-05 DIAGNOSIS — G4733 Obstructive sleep apnea (adult) (pediatric): Secondary | ICD-10-CM | POA: Diagnosis not present

## 2017-05-05 DIAGNOSIS — Z125 Encounter for screening for malignant neoplasm of prostate: Secondary | ICD-10-CM | POA: Diagnosis not present

## 2017-05-05 DIAGNOSIS — E559 Vitamin D deficiency, unspecified: Secondary | ICD-10-CM | POA: Diagnosis not present

## 2017-05-05 DIAGNOSIS — Z6829 Body mass index (BMI) 29.0-29.9, adult: Secondary | ICD-10-CM | POA: Diagnosis not present

## 2017-05-05 DIAGNOSIS — Z79899 Other long term (current) drug therapy: Secondary | ICD-10-CM | POA: Diagnosis not present

## 2017-05-05 DIAGNOSIS — Z0001 Encounter for general adult medical examination with abnormal findings: Secondary | ICD-10-CM | POA: Diagnosis not present

## 2017-05-05 DIAGNOSIS — E669 Obesity, unspecified: Secondary | ICD-10-CM | POA: Diagnosis not present

## 2017-05-05 MED ORDER — GADOBENATE DIMEGLUMINE 529 MG/ML IV SOLN
10.0000 mL | Freq: Once | INTRAVENOUS | Status: AC | PRN
  Administered 2017-05-05: 10 mL via INTRAVENOUS

## 2017-07-07 MED FILL — SIMVASTATIN 80 MG TABLET: 80 | 90 days supply | Qty: 90 | Fill #2

## 2017-07-15 MED FILL — TERBINAFINE HCL 250 MG TAB: 250 | 84 days supply | Qty: 12 | Fill #0

## 2017-10-23 MED FILL — TERBINAFINE HCL 250 MG TABS: 250 | 84 days supply | Qty: 12 | Fill #1

## 2017-11-27 DIAGNOSIS — G4733 Obstructive sleep apnea (adult) (pediatric): Secondary | ICD-10-CM | POA: Diagnosis not present

## 2018-01-06 MED FILL — TERBINAFINE HCL 250 MG TABS: 250 | 84 days supply | Qty: 12 | Fill #2

## 2018-01-06 MED FILL — SIMVASTATIN 80 MG TABLET: 80 | 90 days supply | Qty: 90 | Fill #0

## 2018-04-06 MED FILL — TERBINAFINE HCL 250 MG TABS: 250 | 84 days supply | Qty: 12 | Fill #3

## 2018-04-07 DIAGNOSIS — H52203 Unspecified astigmatism, bilateral: Secondary | ICD-10-CM | POA: Diagnosis not present

## 2018-04-07 DIAGNOSIS — H5340 Unspecified visual field defects: Secondary | ICD-10-CM | POA: Diagnosis not present

## 2018-04-07 DIAGNOSIS — H2513 Age-related nuclear cataract, bilateral: Secondary | ICD-10-CM | POA: Diagnosis not present

## 2018-08-01 DIAGNOSIS — M25561 Pain in right knee: Secondary | ICD-10-CM | POA: Diagnosis not present

## 2018-08-11 DIAGNOSIS — Z23 Encounter for immunization: Secondary | ICD-10-CM | POA: Diagnosis not present

## 2018-08-11 DIAGNOSIS — Z79899 Other long term (current) drug therapy: Secondary | ICD-10-CM | POA: Diagnosis not present

## 2018-08-11 DIAGNOSIS — Z125 Encounter for screening for malignant neoplasm of prostate: Secondary | ICD-10-CM | POA: Diagnosis not present

## 2018-08-11 DIAGNOSIS — E669 Obesity, unspecified: Secondary | ICD-10-CM | POA: Diagnosis not present

## 2018-08-11 DIAGNOSIS — I10 Essential (primary) hypertension: Secondary | ICD-10-CM | POA: Diagnosis not present

## 2018-08-11 DIAGNOSIS — G4733 Obstructive sleep apnea (adult) (pediatric): Secondary | ICD-10-CM | POA: Diagnosis not present

## 2018-08-11 DIAGNOSIS — E559 Vitamin D deficiency, unspecified: Secondary | ICD-10-CM | POA: Diagnosis not present

## 2018-08-11 DIAGNOSIS — L409 Psoriasis, unspecified: Secondary | ICD-10-CM | POA: Diagnosis not present

## 2018-08-11 DIAGNOSIS — E782 Mixed hyperlipidemia: Secondary | ICD-10-CM | POA: Diagnosis not present

## 2018-08-11 DIAGNOSIS — Z0000 Encounter for general adult medical examination without abnormal findings: Secondary | ICD-10-CM | POA: Diagnosis not present

## 2018-08-11 DIAGNOSIS — Z1389 Encounter for screening for other disorder: Secondary | ICD-10-CM | POA: Diagnosis not present

## 2018-08-11 MED FILL — SIMVASTATIN 80 MG TABLET: 80 | 90 days supply | Qty: 90 | Fill #1

## 2018-08-12 MED FILL — TERBINAFINE HCL 250 MG TAB: 250 | 84 days supply | Qty: 12 | Fill #0

## 2018-09-03 DIAGNOSIS — M25562 Pain in left knee: Secondary | ICD-10-CM | POA: Diagnosis not present

## 2018-09-14 DIAGNOSIS — M25562 Pain in left knee: Secondary | ICD-10-CM | POA: Diagnosis not present

## 2018-09-22 DIAGNOSIS — M25562 Pain in left knee: Secondary | ICD-10-CM | POA: Diagnosis not present

## 2018-09-29 DIAGNOSIS — M25562 Pain in left knee: Secondary | ICD-10-CM | POA: Diagnosis not present

## 2018-10-20 DIAGNOSIS — M25562 Pain in left knee: Secondary | ICD-10-CM | POA: Diagnosis not present

## 2018-11-05 MED FILL — TERBINAFINE HCL 250 MG TAB: 250 | 84 days supply | Qty: 12 | Fill #1

## 2018-11-16 MED FILL — SIMVASTATIN 80 MG TABLET: 80 | 90 days supply | Qty: 90 | Fill #2

## 2019-01-20 MED FILL — TERBINAFINE HCL 250 MG TAB: 250 | 84 days supply | Qty: 12 | Fill #0

## 2019-03-30 DIAGNOSIS — R1031 Right lower quadrant pain: Secondary | ICD-10-CM | POA: Diagnosis not present

## 2019-04-14 DIAGNOSIS — H4720 Unspecified optic atrophy: Secondary | ICD-10-CM | POA: Diagnosis not present

## 2019-04-14 DIAGNOSIS — H5340 Unspecified visual field defects: Secondary | ICD-10-CM | POA: Diagnosis not present

## 2019-04-14 DIAGNOSIS — H2513 Age-related nuclear cataract, bilateral: Secondary | ICD-10-CM | POA: Diagnosis not present

## 2019-04-14 DIAGNOSIS — H52203 Unspecified astigmatism, bilateral: Secondary | ICD-10-CM | POA: Diagnosis not present

## 2019-04-14 MED FILL — TERBINAFINE HCL 250 MG TAB: 250 | 84 days supply | Qty: 12 | Fill #1

## 2019-05-13 DIAGNOSIS — K4090 Unilateral inguinal hernia, without obstruction or gangrene, not specified as recurrent: Secondary | ICD-10-CM | POA: Diagnosis not present

## 2019-05-28 ENCOUNTER — Ambulatory Visit: Payer: Self-pay | Admitting: Surgery

## 2019-05-28 DIAGNOSIS — K4090 Unilateral inguinal hernia, without obstruction or gangrene, not specified as recurrent: Secondary | ICD-10-CM | POA: Diagnosis not present

## 2019-05-28 NOTE — H&P (Signed)
History of Present Illness Duane Greene. Greer Wainright MD; 05/28/2019 2:22 PM) The patient is a 68 year old male who presents with an inguinal hernia. Referred by Dr. Inda Merlin for evaluation of right inguinal hernia  This is a 68 year old male in good health who presents with a three-month history of some discomfort in his right groin with any activity. Over the last 2 weeks the patient has developed a palpable bulge in this area. There remains reducible. He denies any changes in his appetite or bowel movements. He has had no imaging of this area. He has had no previous surgery in his abdomen. He was evaluated at the [facility] walk-in clinic and was noted to have a right inguinal hernia. He is now referred to [place] to discuss surgical management.   Past Surgical History Mammie Lorenzo, LPN; 7/82/9562 13:08 AM) Knee Surgery Left. Oral Surgery Shoulder Surgery Right.  Diagnostic Studies History Mammie Lorenzo, LPN; 6/57/8469 62:95 AM) Colonoscopy 1-5 years ago  Allergies Mammie Lorenzo, LPN; 2/84/1324 40:10 AM) No Known Drug Allergies [05/28/2019]:  Medication History Mammie Lorenzo, LPN; 2/72/5366 44:03 AM) Simvastatin (40MG  Tablet, Oral) Active. Multivitamin (Oral) Active. Aspirin (81MG  Tablet, Oral) Active. Lamisil (1% Cream, External) Active. Medications Reconciled  Social History Mammie Lorenzo, LPN; 4/74/2595 63:87 AM) Caffeine use Carbonated beverages, Coffee. No alcohol use No drug use Tobacco use Never smoker.  Family History Mammie Lorenzo, LPN; 5/64/3329 51:88 AM) Alcohol Abuse Father. Cerebrovascular Accident Father. Heart Disease Father.  Other Problems Mammie Lorenzo, LPN; 03/03/6062 01:60 AM) Kidney Stone Sleep Apnea     Review of Systems Claiborne Billings Dockery LPN; 11/26/3233 57:32 AM) General Not Present- Appetite Loss, Chills, Fatigue, Fever, Night Sweats, Weight Gain and Weight Loss. Skin Not Present- Change in Wart/Mole, Dryness, Hives, Jaundice, New  Lesions, Non-Healing Wounds, Rash and Ulcer. HEENT Present- Wears glasses/contact lenses. Not Present- Earache, Hearing Loss, Hoarseness, Nose Bleed, Oral Ulcers, Ringing in the Ears, Seasonal Allergies, Sinus Pain, Sore Throat, Visual Disturbances and Yellow Eyes. Respiratory Not Present- Bloody sputum, Chronic Cough, Difficulty Breathing, Snoring and Wheezing. Breast Not Present- Breast Mass, Breast Pain, Nipple Discharge and Skin Changes. Cardiovascular Not Present- Chest Pain, Difficulty Breathing Lying Down, Leg Cramps, Palpitations, Rapid Heart Rate, Shortness of Breath and Swelling of Extremities. Gastrointestinal Not Present- Abdominal Pain, Bloating, Bloody Stool, Change in Bowel Habits, Chronic diarrhea, Constipation, Difficulty Swallowing, Excessive gas, Gets full quickly at meals, Hemorrhoids, Indigestion, Nausea, Rectal Pain and Vomiting. Male Genitourinary Not Present- Blood in Urine, Change in Urinary Stream, Frequency, Impotence, Nocturia, Painful Urination, Urgency and Urine Leakage. Musculoskeletal Not Present- Back Pain, Joint Pain, Joint Stiffness, Muscle Pain, Muscle Weakness and Swelling of Extremities. Neurological Not Present- Decreased Memory, Fainting, Headaches, Numbness, Seizures, Tingling, Tremor, Trouble walking and Weakness. Psychiatric Not Present- Anxiety, Bipolar, Change in Sleep Pattern, Depression, Fearful and Frequent crying. Endocrine Not Present- Cold Intolerance, Excessive Hunger, Hair Changes, Heat Intolerance, Hot flashes and New Diabetes. Hematology Not Present- Blood Thinners, Easy Bruising, Excessive bleeding, Gland problems, HIV and Persistent Infections.  Vitals Claiborne Billings Dockery LPN; 12/20/5425 06:23 AM) 05/28/2019 10:13 AM Height: 70in Weight Measurement Declined Pulse: 68 (Regular)  BP: 136/74 (Sitting, Left Arm, Standard)        Physical Exam [name] K. Jaquasia Doscher MD; 05/28/2019 2:23 PM)  The physical exam findings are as follows: Note:WDWN  in NAD Eyes: Pupils equal, round; sclera anicteric HENT: Oral mucosa moist; good dentition Neck: No masses palpated, no thyromegaly Lungs: CTA bilaterally; normal respiratory effort CV: Regular rate and rhythm; no murmurs; extremities well-perfused with no  edema Abd: +bowel sounds, soft, non-tender, no palpable organomegaly; no palpable hernias GU: Bilateral descended testes, no testicular masses, visible reducible right inguinal hernia, no sign of left inguinal hernia Skin: Warm, dry; no sign of jaundice Psychiatric - alert and oriented x 4; calm mood and affect    Assessment & Plan Molli Hazard(Padraig Nhan K. Katja Blue MD; 05/28/2019 10:40 AM)  INGUINAL HERNIA OF RIGHT SIDE WITHOUT OBSTRUCTION OR GANGRENE (K40.90)  Current Plans Schedule for Surgery - Right inguinal hernia repair with mesh. The surgical procedure has been discussed with the patient. Potential risks, benefits, alternative treatments, and expected outcomes have been explained. All of the patient's questions at this time have been answered. The likelihood of reaching the patient's treatment goal is good. The patient understand the proposed surgical procedure and wishes to proceed.  Wilmon ArmsMatthew K. Corliss Skainssuei, MD, [facility]  General/ Trauma Surgery Beeper [phone]  05/28/2019 2:23 PM

## 2019-06-17 NOTE — Progress Notes (Signed)
Pt called the office inquiring about PAT appt. I briefly reviewed his medical hx and current meds in Epic and with him and according to that information I informed him that the only thing he should have to do before surgery is has his COVID swab---no other labs or an EKG should be needed. I also informed him that someone from our office will call him around 06/23/19 to complete his phone PAT.

## 2019-06-22 ENCOUNTER — Encounter (HOSPITAL_BASED_OUTPATIENT_CLINIC_OR_DEPARTMENT_OTHER): Payer: Self-pay | Admitting: *Deleted

## 2019-06-22 ENCOUNTER — Other Ambulatory Visit: Payer: Self-pay

## 2019-06-26 ENCOUNTER — Other Ambulatory Visit (HOSPITAL_COMMUNITY)
Admission: RE | Admit: 2019-06-26 | Discharge: 2019-06-26 | Disposition: A | Discharge status: Home / Self Care | Payer: PPO | Source: Ambulatory Visit | Attending: Surgery | Admitting: Surgery

## 2019-06-26 DIAGNOSIS — K4090 Unilateral inguinal hernia, without obstruction or gangrene, not specified as recurrent: Secondary | ICD-10-CM | POA: Diagnosis not present

## 2019-06-26 DIAGNOSIS — Z20828 Contact with and (suspected) exposure to other viral communicable diseases: Secondary | ICD-10-CM | POA: Diagnosis not present

## 2019-06-26 DIAGNOSIS — Z01812 Encounter for preprocedural laboratory examination: Secondary | ICD-10-CM | POA: Insufficient documentation

## 2019-06-27 LAB — SARS CORONAVIRUS 2 (TAT 6-24 HRS): SARS Coronavirus 2: NEGATIVE

## 2019-06-30 ENCOUNTER — Ambulatory Visit (HOSPITAL_BASED_OUTPATIENT_CLINIC_OR_DEPARTMENT_OTHER): Payer: PPO | Admitting: Certified Registered"

## 2019-06-30 ENCOUNTER — Ambulatory Visit (HOSPITAL_BASED_OUTPATIENT_CLINIC_OR_DEPARTMENT_OTHER)
Admission: RE | Admit: 2019-06-30 | Discharge: 2019-06-30 | Disposition: A | Discharge status: Home / Self Care | Payer: PPO | Attending: Surgery | Admitting: Surgery

## 2019-06-30 ENCOUNTER — Other Ambulatory Visit: Payer: Self-pay

## 2019-06-30 ENCOUNTER — Encounter (HOSPITAL_BASED_OUTPATIENT_CLINIC_OR_DEPARTMENT_OTHER): Payer: Self-pay | Admitting: *Deleted

## 2019-06-30 ENCOUNTER — Encounter (HOSPITAL_BASED_OUTPATIENT_CLINIC_OR_DEPARTMENT_OTHER)
Admission: RE | Disposition: A | Discharge status: Home / Self Care | Payer: Self-pay | Source: Home / Self Care | Attending: Surgery

## 2019-06-30 DIAGNOSIS — K4090 Unilateral inguinal hernia, without obstruction or gangrene, not specified as recurrent: Secondary | ICD-10-CM | POA: Insufficient documentation

## 2019-06-30 DIAGNOSIS — E785 Hyperlipidemia, unspecified: Secondary | ICD-10-CM | POA: Insufficient documentation

## 2019-06-30 DIAGNOSIS — Z7982 Long term (current) use of aspirin: Secondary | ICD-10-CM | POA: Diagnosis not present

## 2019-06-30 DIAGNOSIS — G4730 Sleep apnea, unspecified: Secondary | ICD-10-CM | POA: Diagnosis not present

## 2019-06-30 DIAGNOSIS — G8918 Other acute postprocedural pain: Secondary | ICD-10-CM | POA: Diagnosis not present

## 2019-06-30 HISTORY — PX: INGUINAL HERNIA REPAIR: SHX194

## 2019-06-30 HISTORY — DX: Sleep apnea, unspecified: G47.30

## 2019-06-30 HISTORY — DX: Unilateral inguinal hernia, without obstruction or gangrene, not specified as recurrent: K40.90

## 2019-06-30 HISTORY — DX: Hyperlipidemia, unspecified: E78.5

## 2019-06-30 SURGERY — REPAIR, HERNIA, INGUINAL, ADULT
Anesthesia: General | Site: Groin | Laterality: Right

## 2019-06-30 MED ORDER — GABAPENTIN 300 MG PO CAPS
ORAL_CAPSULE | ORAL | Status: AC
  Filled 2019-06-30: qty 1

## 2019-06-30 MED ORDER — MIDAZOLAM HCL 2 MG/2ML IJ SOLN
INTRAMUSCULAR | Status: AC
  Filled 2019-06-30: qty 2

## 2019-06-30 MED ORDER — FENTANYL CITRATE (PF) 100 MCG/2ML IJ SOLN
25.0000 ug | INTRAMUSCULAR | Status: DC | PRN
  Administered 2019-06-30: 25 ug via INTRAVENOUS

## 2019-06-30 MED ORDER — ACETAMINOPHEN 500 MG PO TABS
1000.0000 mg | ORAL_TABLET | ORAL | Status: AC
  Administered 2019-06-30: 1000 mg via ORAL

## 2019-06-30 MED ORDER — PROPOFOL 10 MG/ML IV BOLUS
INTRAVENOUS | Status: DC | PRN
  Administered 2019-06-30: 150 mg via INTRAVENOUS

## 2019-06-30 MED ORDER — FENTANYL CITRATE (PF) 100 MCG/2ML IJ SOLN
INTRAMUSCULAR | Status: DC | PRN
  Administered 2019-06-30 (×2): 25 ug via INTRAVENOUS

## 2019-06-30 MED ORDER — LACTATED RINGERS IV SOLN
INTRAVENOUS | Status: DC
  Administered 2019-06-30 (×2): via INTRAVENOUS

## 2019-06-30 MED ORDER — FENTANYL CITRATE (PF) 100 MCG/2ML IJ SOLN
50.0000 ug | INTRAMUSCULAR | Status: DC | PRN
  Administered 2019-06-30: 50 ug via INTRAVENOUS

## 2019-06-30 MED ORDER — BUPIVACAINE-EPINEPHRINE 0.25% -1:200000 IJ SOLN
INTRAMUSCULAR | Status: DC | PRN
  Administered 2019-06-30: 10 mL

## 2019-06-30 MED ORDER — FENTANYL CITRATE (PF) 100 MCG/2ML IJ SOLN
INTRAMUSCULAR | Status: AC
  Filled 2019-06-30: qty 2

## 2019-06-30 MED ORDER — ROPIVACAINE HCL 5 MG/ML IJ SOLN
INTRAMUSCULAR | Status: DC | PRN
  Administered 2019-06-30: 30 mL via PERINEURAL

## 2019-06-30 MED ORDER — DEXAMETHASONE SODIUM PHOSPHATE 10 MG/ML IJ SOLN
INTRAMUSCULAR | Status: AC
  Filled 2019-06-30: qty 1

## 2019-06-30 MED ORDER — SCOPOLAMINE 1 MG/3DAYS TD PT72: 1.0000 | MEDICATED_PATCH | Freq: Once | TRANSDERMAL | Status: DC

## 2019-06-30 MED ORDER — GABAPENTIN 300 MG PO CAPS
300.0000 mg | ORAL_CAPSULE | ORAL | Status: AC
  Administered 2019-06-30: 300 mg via ORAL

## 2019-06-30 MED ORDER — CHLORHEXIDINE GLUCONATE CLOTH 2 % EX PADS: 6.0000 | MEDICATED_PAD | Freq: Once | CUTANEOUS | Status: DC

## 2019-06-30 MED ORDER — CEFAZOLIN SODIUM-DEXTROSE 2-4 GM/100ML-% IV SOLN
INTRAVENOUS | Status: AC
  Filled 2019-06-30: qty 100

## 2019-06-30 MED ORDER — DEXAMETHASONE SODIUM PHOSPHATE 4 MG/ML IJ SOLN
INTRAMUSCULAR | Status: DC | PRN
  Administered 2019-06-30: 10 mg via INTRAVENOUS

## 2019-06-30 MED ORDER — ONDANSETRON HCL 4 MG/2ML IJ SOLN
INTRAMUSCULAR | Status: DC | PRN
  Administered 2019-06-30: 4 mg via INTRAVENOUS

## 2019-06-30 MED ORDER — PROPOFOL 10 MG/ML IV BOLUS
INTRAVENOUS | Status: AC
  Filled 2019-06-30: qty 20

## 2019-06-30 MED ORDER — EPHEDRINE SULFATE 50 MG/ML IJ SOLN
INTRAMUSCULAR | Status: DC | PRN
  Administered 2019-06-30 (×2): 5 mg via INTRAVENOUS

## 2019-06-30 MED ORDER — ACETAMINOPHEN 500 MG PO TABS
ORAL_TABLET | ORAL | Status: AC
  Filled 2019-06-30: qty 2

## 2019-06-30 MED ORDER — OXYCODONE HCL 5 MG PO TABS: 5.0000 mg | ORAL_TABLET | Freq: Four times a day (QID) | ORAL | 0 refills | Status: DC | PRN

## 2019-06-30 MED ORDER — ONDANSETRON HCL 4 MG/2ML IJ SOLN: 4.0000 mg | Freq: Once | INTRAMUSCULAR | Status: DC | PRN

## 2019-06-30 MED ORDER — MIDAZOLAM HCL 2 MG/2ML IJ SOLN
1.0000 mg | INTRAMUSCULAR | Status: DC | PRN
  Administered 2019-06-30: 1 mg via INTRAVENOUS

## 2019-06-30 MED ORDER — OXYCODONE HCL 5 MG PO TABS: 5.0000 mg | ORAL_TABLET | Freq: Once | ORAL | Status: DC | PRN

## 2019-06-30 MED ORDER — OXYCODONE HCL 5 MG/5ML PO SOLN: 5.0000 mg | Freq: Once | ORAL | Status: DC | PRN

## 2019-06-30 MED ORDER — ONDANSETRON HCL 4 MG/2ML IJ SOLN
INTRAMUSCULAR | Status: AC
  Filled 2019-06-30: qty 8

## 2019-06-30 MED ORDER — CEFAZOLIN SODIUM-DEXTROSE 2-4 GM/100ML-% IV SOLN
2.0000 g | INTRAVENOUS | Status: AC
  Administered 2019-06-30: 2 g via INTRAVENOUS

## 2019-06-30 MED FILL — oxyCODONE HCL 5 MG TABS: 5 | 4 days supply | Qty: 15 | Fill #0

## 2019-06-30 SURGICAL SUPPLY — 54 items
APL PRP STRL LF DISP 70% ISPRP (MISCELLANEOUS) ×1
APL SKNCLS STERI-STRIP NONHPOA (GAUZE/BANDAGES/DRESSINGS) ×1
BENZOIN TINCTURE PRP APPL 2/3 (GAUZE/BANDAGES/DRESSINGS) ×3 IMPLANT
BLADE CLIPPER SURG (BLADE) ×2 IMPLANT
BLADE HEX COATED 2.75 (ELECTRODE) ×3 IMPLANT
BLADE SURG 15 STRL LF DISP TIS (BLADE) ×2 IMPLANT
BLADE SURG 15 STRL SS (BLADE) ×3
CANISTER SUCT 1200ML W/VALVE (MISCELLANEOUS) IMPLANT
CHLORAPREP W/TINT 26 (MISCELLANEOUS) ×3 IMPLANT
CLOSURE WOUND 1/2 X4 (GAUZE/BANDAGES/DRESSINGS) ×1
COVER BACK TABLE REUSABLE LG (DRAPES) ×3 IMPLANT
COVER MAYO STAND REUSABLE (DRAPES) ×3 IMPLANT
COVER WAND RF STERILE (DRAPES) IMPLANT
DECANTER SPIKE VIAL GLASS SM (MISCELLANEOUS) ×1 IMPLANT
DRAIN PENROSE 1/2X12 LTX STRL (WOUND CARE) ×3 IMPLANT
DRAPE LAPAROTOMY TRNSV 102X78 (DRAPES) ×3 IMPLANT
DRAPE UTILITY XL STRL (DRAPES) ×3 IMPLANT
DRSG TEGADERM 4X4.75 (GAUZE/BANDAGES/DRESSINGS) ×3 IMPLANT
ELECT REM PT RETURN 9FT ADLT (ELECTROSURGICAL) ×3
ELECTRODE REM PT RTRN 9FT ADLT (ELECTROSURGICAL) ×1 IMPLANT
GAUZE SPONGE 4X4 12PLY STRL LF (GAUZE/BANDAGES/DRESSINGS) ×3 IMPLANT
GLOVE BIO SURGEON STRL SZ7 (GLOVE) ×3 IMPLANT
GLOVE BIOGEL PI IND STRL 7.5 (GLOVE) ×1 IMPLANT
GLOVE BIOGEL PI INDICATOR 7.5 (GLOVE) ×2
GOWN STRL REUS W/ TWL LRG LVL3 (GOWN DISPOSABLE) ×2 IMPLANT
GOWN STRL REUS W/TWL LRG LVL3 (GOWN DISPOSABLE) ×6
MESH PARIETEX PROGRIP RIGHT (Mesh General) ×2 IMPLANT
NDL HYPO 25X1 1.5 SAFETY (NEEDLE) ×1 IMPLANT
NEEDLE HYPO 25X1 1.5 SAFETY (NEEDLE) ×3 IMPLANT
NS IRRIG 1000ML POUR BTL (IV SOLUTION) ×2 IMPLANT
PACK BASIN DAY SURGERY FS (CUSTOM PROCEDURE TRAY) ×3 IMPLANT
PENCIL BUTTON HOLSTER BLD 10FT (ELECTRODE) ×3 IMPLANT
SLEEVE SCD COMPRESS KNEE MED (MISCELLANEOUS) ×3 IMPLANT
SPONGE INTESTINAL PEANUT (DISPOSABLE) ×3 IMPLANT
SPONGE LAP 18X18 RF (DISPOSABLE) IMPLANT
STRIP CLOSURE SKIN 1/2X4 (GAUZE/BANDAGES/DRESSINGS) ×2 IMPLANT
SUT MON AB 4-0 PC3 18 (SUTURE) ×3 IMPLANT
SUT PDS AB 0 CT 36 (SUTURE) IMPLANT
SUT SILK 2 0 SH (SUTURE) IMPLANT
SUT SILK 3 0 SH 30 (SUTURE) IMPLANT
SUT SILK 3 0 TIES 17X18 (SUTURE)
SUT SILK 3-0 18XBRD TIE BLK (SUTURE) IMPLANT
SUT VIC AB 0 CT1 27 (SUTURE) ×6
SUT VIC AB 0 CT1 27XBRD ANBCTR (SUTURE) ×1 IMPLANT
SUT VIC AB 0 SH 27 (SUTURE) IMPLANT
SUT VIC AB 2-0 SH 27 (SUTURE) ×3
SUT VIC AB 2-0 SH 27XBRD (SUTURE) ×1 IMPLANT
SUT VIC AB 3-0 SH 27 (SUTURE) ×3
SUT VIC AB 3-0 SH 27X BRD (SUTURE) ×1 IMPLANT
SYR CONTROL 10ML LL (SYRINGE) ×3 IMPLANT
TOWEL GREEN STERILE FF (TOWEL DISPOSABLE) ×3 IMPLANT
TUBE CONNECTING 20'X1/4 (TUBING)
TUBE CONNECTING 20X1/4 (TUBING) IMPLANT
YANKAUER SUCT BULB TIP NO VENT (SUCTIONS) IMPLANT

## 2019-06-30 NOTE — H&P (Signed)
History of Present Illness  The patient is a 68 year old male who presents with an inguinal hernia. Referred by Dr. Inda Merlin for evaluation of right inguinal hernia  This is a 68 year old male in good health who presents with a three-month history of some discomfort in his right groin with any activity. Over the last 2 weeks the patient has developed a palpable bulge in this area. There remains reducible. He denies any changes in his appetite or bowel movements. He has had no imaging of this area. He has had no previous surgery in his abdomen. He was evaluated at the [facility] walk-in clinic and was noted to have a right inguinal hernia. He is now referred to [place] to discuss surgical management.   Past Surgical History  Knee Surgery Left. Oral Surgery Shoulder Surgery Right.  Diagnostic Studies History Colonoscopy 1-5 years ago  Allergies Mammie Lorenzo, LPN;  No Known Drug Allergies   Medication History  Simvastatin (40MG  Tablet, Oral) Active. Multivitamin (Oral) Active. Aspirin (81MG  Tablet, Oral) Active. Lamisil (1% Cream, External) Active. Medications Reconciled  Social History  Caffeine use Carbonated beverages, Coffee. No alcohol use No drug use Tobacco use Never smoker.  Family History  Alcohol Abuse Father. Cerebrovascular Accident Father. Heart Disease Father.  Other Problems  Kidney Stone Sleep Apnea     Review of Systems  General Not Present- Appetite Loss, Chills, Fatigue, Fever, Night Sweats, Weight Gain and Weight Loss. Skin Not Present- Change in Wart/Mole, Dryness, Hives, Jaundice, New Lesions, Non-Healing Wounds, Rash and Ulcer. HEENT Present- Wears glasses/contact lenses. Not Present- Earache, Hearing Loss, Hoarseness, Nose Bleed, Oral Ulcers, Ringing in the Ears, Seasonal Allergies, Sinus Pain, Sore Throat, Visual Disturbances and Yellow Eyes. Respiratory Not Present- Bloody sputum, Chronic Cough, Difficulty Breathing,  Snoring and Wheezing. Breast Not Present- Breast Mass, Breast Pain, Nipple Discharge and Skin Changes. Cardiovascular Not Present- Chest Pain, Difficulty Breathing Lying Down, Leg Cramps, Palpitations, Rapid Heart Rate, Shortness of Breath and Swelling of Extremities. Gastrointestinal Not Present- Abdominal Pain, Bloating, Bloody Stool, Change in Bowel Habits, Chronic diarrhea, Constipation, Difficulty Swallowing, Excessive gas, Gets full quickly at meals, Hemorrhoids, Indigestion, Nausea, Rectal Pain and Vomiting. Male Genitourinary Not Present- Blood in Urine, Change in Urinary Stream, Frequency, Impotence, Nocturia, Painful Urination, Urgency and Urine Leakage. Musculoskeletal Not Present- Back Pain, Joint Pain, Joint Stiffness, Muscle Pain, Muscle Weakness and Swelling of Extremities. Neurological Not Present- Decreased Memory, Fainting, Headaches, Numbness, Seizures, Tingling, Tremor, Trouble walking and Weakness. Psychiatric Not Present- Anxiety, Bipolar, Change in Sleep Pattern, Depression, Fearful and Frequent crying. Endocrine Not Present- Cold Intolerance, Excessive Hunger, Hair Changes, Heat Intolerance, Hot flashes and New Diabetes. Hematology Not Present- Blood Thinners, Easy Bruising, Excessive bleeding, Gland problems, HIV and Persistent Infections.  Vitals  Height: 70in Weight Measurement Declined Pulse: 68 (Regular)  BP: 136/74 (Sitting, Left Arm, Standard)        Physical Exam   The physical exam findings are as follows: Note:WDWN in NAD Eyes: Pupils equal, round; sclera anicteric HENT: Oral mucosa moist; good dentition Neck: No masses palpated, no thyromegaly Lungs: CTA bilaterally; normal respiratory effort CV: Regular rate and rhythm; no murmurs; extremities well-perfused with no edema Abd: +bowel sounds, soft, non-tender, no palpable organomegaly; no palpable hernias GU: Bilateral descended testes, no testicular masses, visible reducible right  inguinal hernia, no sign of left inguinal hernia Skin: Warm, dry; no sign of jaundice Psychiatric - alert and oriented x 4; calm mood and affect    Assessment & Plan   INGUINAL HERNIA  OF RIGHT SIDE WITHOUT OBSTRUCTION OR GANGRENE (K40.90)  Current Plans Schedule for Surgery - Right inguinal hernia repair with mesh. The surgical procedure has been discussed with the patient. Potential risks, benefits, alternative treatments, and expected outcomes have been explained. All of the patient's questions at this time have been answered. The likelihood of reaching the patient's treatment goal is good. The patient understand the proposed surgical procedure and wishes to proceed.   Duane ArmsMatthew K. Corliss Skainssuei, MD, [facility] [place]  General/ Trauma Surgery Beeper [phone]  06/30/2019 1:40 PM

## 2019-06-30 NOTE — Anesthesia Postprocedure Evaluation (Signed)
Anesthesia Post Note  Patient: Duane Greene  Procedure(s) Performed: RIGHT INGUINAL HERNIA REPAIR WITH MESH (Right Groin)     Patient location during evaluation: PACU Anesthesia Type: General Level of consciousness: awake and alert Pain management: pain level controlled Vital Signs Assessment: post-procedure vital signs reviewed and stable Respiratory status: spontaneous breathing, nonlabored ventilation and respiratory function stable Cardiovascular status: blood pressure returned to baseline and stable Postop Assessment: no apparent nausea or vomiting Anesthetic complications: no    Last Vitals:  Vitals:   06/30/19 1545 06/30/19 1615  BP: 128/74 132/69  Pulse: 63 61  Resp: (!) 21 18  Temp:  36.7 C  SpO2: 99% 100%    Last Pain:  Vitals:   06/30/19 1615  TempSrc:   PainSc: 3                  Lidia Collum

## 2019-06-30 NOTE — Anesthesia Procedure Notes (Signed)
Procedure Name: LMA Insertion Date/Time: 06/30/2019 2:17 PM Performed by: Lavonia Dana, CRNA Pre-anesthesia Checklist: Patient identified, Emergency Drugs available, Suction available and Patient being monitored Patient Re-evaluated:Patient Re-evaluated prior to induction Oxygen Delivery Method: Circle system utilized Preoxygenation: Pre-oxygenation with 100% oxygen Induction Type: IV induction Ventilation: Mask ventilation without difficulty LMA: LMA inserted LMA Size: 4.0 Number of attempts: 1 Airway Equipment and Method: Bite block Placement Confirmation: positive ETCO2 Tube secured with: Tape Dental Injury: Teeth and Oropharynx as per pre-operative assessment

## 2019-06-30 NOTE — Anesthesia Preprocedure Evaluation (Addendum)
Anesthesia Evaluation  Patient identified by MRN, date of birth, ID band Patient awake    Reviewed: Allergy & Precautions, NPO status , Patient's Chart, lab work & pertinent test results  History of Anesthesia Complications Negative for: history of anesthetic complications  Airway Mallampati: II  TM Distance: >3 FB Neck ROM: Full    Dental  (+) Teeth Intact   Pulmonary sleep apnea and Continuous Positive Airway Pressure Ventilation ,    Pulmonary exam normal        Cardiovascular negative cardio ROS Normal cardiovascular exam     Neuro/Psych negative neurological ROS  negative psych ROS   GI/Hepatic negative GI ROS, Neg liver ROS,   Endo/Other  negative endocrine ROS  Renal/GU negative Renal ROS  negative genitourinary   Musculoskeletal negative musculoskeletal ROS (+)   Abdominal   Peds  Hematology negative hematology ROS (+)   Anesthesia Other Findings HLD  Reproductive/Obstetrics                           Anesthesia Physical Anesthesia Plan  ASA: II  Anesthesia Plan: General   Post-op Pain Management: GA combined w/ Regional for post-op pain   Induction: Intravenous  PONV Risk Score and Plan: 2 and Dexamethasone, Ondansetron, Midazolam and Treatment may vary due to age or medical condition  Airway Management Planned: LMA  Additional Equipment: None  Intra-op Plan:   Post-operative Plan: Extubation in OR  Informed Consent: I have reviewed the patients History and Physical, chart, labs and discussed the procedure including the risks, benefits and alternatives for the proposed anesthesia with the patient or authorized representative who has indicated his/her understanding and acceptance.     Dental advisory given  Plan Discussed with:   Anesthesia Plan Comments:        Anesthesia Quick Evaluation

## 2019-06-30 NOTE — Progress Notes (Signed)
Assisted Dr. Witman with right, ultrasound guided, transabdominal plane block. Side rails up, monitors on throughout procedure. See vital signs in flow sheet. Tolerated Procedure well. 

## 2019-06-30 NOTE — Anesthesia Procedure Notes (Signed)
Anesthesia Regional Block: TAP block   Pre-Anesthetic Checklist: ,, timeout performed, Correct Patient, Correct Site, Correct Laterality, Correct Procedure, Correct Position, site marked, Risks and benefits discussed,  Surgical consent,  Pre-op evaluation,  At surgeon's request and post-op pain management  Laterality: Right  Prep: chloraprep       Needles:  Injection technique: Single-shot  Needle Type: Echogenic Stimulator Needle     Needle Length: 10cm  Needle Gauge: 21     Additional Needles:   Procedures:,,,, ultrasound used (permanent image in chart),,,,  Narrative:  Start time: 06/30/2019 1:55 PM End time: 06/30/2019 1:59 PM Injection made incrementally with aspirations every 5 mL.  Performed by: Personally  Anesthesiologist: Lidia Collum, MD  Additional Notes: Monitors applied. Injection made in 5cc increments. No resistance to injection. Good needle visualization. Patient tolerated procedure well.

## 2019-06-30 NOTE — Op Note (Signed)
Right inguinal hernia repair with mesh  Indications: This is a 68 year old male in good health who presents with a three-month history of some discomfort in his right groin with any activity. Over the last 2 weeks the patient has developed a palpable bulge in this area. There remains reducible. He denies any changes in his appetite or bowel movements. He has had no imaging of this area. He has had no previous surgery in his abdomen. He was evaluated at the [facility] walk-in clinic and was noted to have a right inguinal hernia.   Pre-operative Diagnosis: right reducible inguinal hernia Post-operative Diagnosis: same  Surgeon: Wynona LunaMatthew K Sirr Kabel   Assistants: Hedda SladePuja Gosai, PA-C  Anesthesia: General LMA anesthesia and TAP block  ASA Class: 2  Procedure Details  The patient was seen again in the Holding Room. The risks, benefits, complications, treatment options, and expected outcomes were discussed with the patient. The possibilities of reaction to medication, pulmonary aspiration, perforation of viscus, bleeding, recurrent infection, the need for additional procedures, and development of a complication requiring transfusion or further operation were discussed with the patient and/or family. The likelihood of success in repairing the hernia and returning the patient to their previous functional status is good.  There was concurrence with the proposed plan, and informed consent was obtained. The site of surgery was properly noted/marked. The patient was taken to the Operating Room, identified as Duane Greene, and the procedure verified as right inguinal hernia repair. A Time Out was held and the above information confirmed.  The patient was placed in the supine position and underwent induction of anesthesia. The lower abdomen and groin was prepped with Chloraprep and draped in the standard fashion, and 0.25% Marcaine with epinephrine was used to anesthetize the skin over the mid-portion of the inguinal  canal. An oblique incision was made. Dissection was carried down through the subcutaneous tissue with cautery to the external oblique fascia.  We opened the external oblique fascia along the direction of its fibers to the external ring.  The spermatic cord was circumferentially dissected bluntly and retracted with a Penrose drain.  The ilioinguinal nerve was identified and preserved.  The floor of the inguinal canal was inspected and was very lax, but no obvious direct defect.  We skeletonized the spermatic cord and reduced a moderate-sized indirect hernia sac.  I imbricated the floor of the canal with 0 Vicryl.  The internal ring was tightened with 0 Vicryl.  We used a right Progrip mesh which was inserted and deployed across the floor of the inguinal canal. The mesh was tucked underneath the external oblique fascia laterally.  The flap of the mesh was closed around the spermatic cord to recreate the internal inguinal ring. The mesh was secured to the pubic tubercle with 0 Vicryl.  An additional stay suture of 0 Vicryl was used to attach the mesh to the shelving edge inferiorly. The external oblique fascia was reapproximated with 2-0 Vicryl.  3-0 Vicryl was used to close the subcutaneous tissues and 4-0 Monocryl was used to close the skin in subcuticular fashion.  Benzoin and steri-strips were used to seal the incision.  A clean dressing was applied.  The patient was then extubated and brought to the recovery room in stable condition.  All sponge, instrument, and needle counts were correct prior to closure and at the conclusion of the case.   Estimated Blood Loss: Minimal  Complications: None; patient tolerated the procedure well.         Disposition: PACU - hemodynamically stable.         Condition: stable  Imogene Burn. Georgette Dover, MD, [facility]  General/ Trauma Surgery Beeper [phone]  06/30/2019 3:11 PM

## 2019-06-30 NOTE — Transfer of Care (Signed)
Immediate Anesthesia Transfer of Care Note  Patient: Duane Greene  Procedure(s) Performed: RIGHT INGUINAL HERNIA REPAIR WITH MESH (Right Groin)  Patient Location: PACU  Anesthesia Type:GA combined with regional for post-op pain  Level of Consciousness: awake, alert , oriented and drowsy  Airway & Oxygen Therapy: Patient Spontanous Breathing and Patient connected to face mask oxygen  Post-op Assessment: Report given to RN and Post -op Vital signs reviewed and stable  Post vital signs: Reviewed and stable  Last Vitals:  Vitals Value Taken Time  BP    Temp    Pulse 72 06/30/19 1515  Resp 7 06/30/19 1515  SpO2 97 % 06/30/19 1515  Vitals shown include unvalidated device data.  Last Pain:  Vitals:   06/30/19 1325  TempSrc: Oral  PainSc: 0-No pain         Complications: No apparent anesthesia complications

## 2019-06-30 NOTE — Discharge Instructions (Signed)
**You had 1000 mg of Tylenol at 1:45 pm  [facility] _______[facility]  UMBILICAL OR INGUINAL HERNIA REPAIR: POST OP INSTRUCTIONS  Always review your discharge instruction sheet given to you by the facility where your surgery was performed. IF YOU HAVE DISABILITY OR FAMILY LEAVE FORMS, YOU MUST BRING THEM TO THE OFFICE FOR PROCESSING.   DO NOT GIVE THEM TO YOUR DOCTOR.  1. A  prescription for pain medication may be given to you upon discharge.  Take your pain medication as prescribed, if needed.  If narcotic pain medicine is not needed, then you may take acetaminophen (Tylenol) or ibuprofen (Advil) as needed. 2. Take your usually prescribed medications unless otherwise directed. If you need a refill on your pain medication, please contact your pharmacy.  They will contact our office to request authorization. Prescriptions will not be filled after 5 pm or on week-ends. 3. You should follow a light diet the first 24 hours after arrival home, such as soup and crackers, etc.  Be sure to include lots of fluids daily.  Resume your normal diet the day after surgery. 4.Most patients will experience some swelling and bruising around the umbilicus or in the groin and scrotum.  Ice packs and reclining will help.  Swelling and bruising can take several days to resolve.  6. It is common to experience some constipation if taking pain medication after surgery.  Increasing fluid intake and taking a stool softener (such as Colace) will usually help or prevent this problem from occurring.  A mild laxative (Milk of Magnesia or Miralax) should be taken according to package directions if there are no bowel movements after 48 hours. 7. Unless discharge instructions indicate otherwise, you may remove your bandages 24-48 hours after surgery, and you may shower at that time.  You may have steri-strips (small skin tapes) in place directly over the incision.  These strips should be left on the skin for 7-10 days.  If  your surgeon used skin glue on the incision, you may shower in 24 hours.  The glue will flake off over the next 2-3 weeks.  Any sutures or staples will be removed at the office during your follow-up visit. 8. ACTIVITIES:  You may resume regular (light) daily activities beginning the next day--such as daily self-care, walking, climbing stairs--gradually increasing activities as tolerated.  You may have sexual intercourse when it is comfortable.  Refrain from any heavy lifting or straining until approved by your doctor.  a.You may drive when you are no longer taking prescription pain medication, you can comfortably wear a seatbelt, and you can safely maneuver your car and apply brakes. b.RETURN TO WORK:   _____________________________________________  9.You should see your doctor in the office for a follow-up appointment approximately 2-3 weeks after your surgery.  Make sure that you call for this appointment within a day or two after you arrive home to insure a convenient appointment time. 10.OTHER INSTRUCTIONS: _________________________    _____________________________________  WHEN TO CALL YOUR DOCTOR: 1. Fever over 101.0 2. Inability to urinate 3. Nausea and/or vomiting 4. Extreme swelling or bruising 5. Continued bleeding from incision. 6. Increased pain, redness, or drainage from the incision  The clinic staff is available to answer your questions during regular business hours.  Please dont hesitate to call and ask to speak to one of the nurses for clinical concerns.  If you have a medical emergency, go to the nearest emergency room or call 911.  A Psychologist, sport and exercise from [facility]  Surgery is always on call at the hospital   [address], [address], [place], [place]  [id number] ?  [address] [address], [place], [place]   [id number] [phone] ? [phone] ? FAX [phone] Web site: [email or web address]    Post Anesthesia Home Care Instructions  Activity: Get plenty of  rest for the remainder of the day. A responsible individual must stay with you for 24 hours following the procedure.  For the next 24 hours, DO NOT: -Drive a car -Advertising copywriterperate machinery -Drink alcoholic beverages -Take any medication unless instructed by your physician -Make any legal decisions or sign important papers.  Meals: Start with liquid foods such as gelatin or soup. Progress to regular foods as tolerated. Avoid greasy, spicy, heavy foods. If nausea and/or vomiting occur, drink only clear liquids until the nausea and/or vomiting subsides. Call your physician if vomiting continues.  Special Instructions/Symptoms: Your throat may feel dry or sore from the anesthesia or the breathing tube placed in your throat during surgery. If this causes discomfort, gargle with warm salt water. The discomfort should disappear within 24 hours.  If you had a scopolamine patch placed behind your ear for the management of post- operative nausea and/or vomiting:  1. The medication in the patch is effective for 72 hours, after which it should be removed.  Wrap patch in a tissue and discard in the trash. Wash hands thoroughly with soap and water. 2. You may remove the patch earlier than 72 hours if you experience unpleasant side effects which may include dry mouth, dizziness or visual disturbances. 3. Avoid touching the patch. Wash your hands with soap and water after contact with the patch.

## 2019-07-01 NOTE — Addendum Note (Signed)
Addendum  created 07/01/19 0732 by Tamanika Heiney, Ernesta Amble, CRNA   Charge Capture section accepted

## 2019-07-02 ENCOUNTER — Encounter (HOSPITAL_BASED_OUTPATIENT_CLINIC_OR_DEPARTMENT_OTHER): Payer: Self-pay | Admitting: Surgery

## 2019-07-28 MED FILL — TERBINAFINE HCL 250 MG TAB: 250 | 84 days supply | Qty: 12 | Fill #2

## 2019-08-17 MED FILL — SIMVASTATIN 80 MG TABLET: 80 | 90 days supply | Qty: 90 | Fill #0

## 2019-08-26 DIAGNOSIS — I10 Essential (primary) hypertension: Secondary | ICD-10-CM | POA: Diagnosis not present

## 2019-08-26 DIAGNOSIS — E782 Mixed hyperlipidemia: Secondary | ICD-10-CM | POA: Diagnosis not present

## 2019-08-26 DIAGNOSIS — Z23 Encounter for immunization: Secondary | ICD-10-CM | POA: Diagnosis not present

## 2019-08-26 DIAGNOSIS — Z1389 Encounter for screening for other disorder: Secondary | ICD-10-CM | POA: Diagnosis not present

## 2019-08-26 DIAGNOSIS — E559 Vitamin D deficiency, unspecified: Secondary | ICD-10-CM | POA: Diagnosis not present

## 2019-08-26 DIAGNOSIS — E669 Obesity, unspecified: Secondary | ICD-10-CM | POA: Diagnosis not present

## 2019-08-26 DIAGNOSIS — G4733 Obstructive sleep apnea (adult) (pediatric): Secondary | ICD-10-CM | POA: Diagnosis not present

## 2019-08-26 DIAGNOSIS — L409 Psoriasis, unspecified: Secondary | ICD-10-CM | POA: Diagnosis not present

## 2019-08-26 DIAGNOSIS — Z125 Encounter for screening for malignant neoplasm of prostate: Secondary | ICD-10-CM | POA: Diagnosis not present

## 2019-08-26 DIAGNOSIS — Z79899 Other long term (current) drug therapy: Secondary | ICD-10-CM | POA: Diagnosis not present

## 2019-08-26 DIAGNOSIS — K4090 Unilateral inguinal hernia, without obstruction or gangrene, not specified as recurrent: Secondary | ICD-10-CM | POA: Diagnosis not present

## 2019-08-26 DIAGNOSIS — Z0001 Encounter for general adult medical examination with abnormal findings: Secondary | ICD-10-CM | POA: Diagnosis not present

## 2019-09-21 DIAGNOSIS — R3121 Asymptomatic microscopic hematuria: Secondary | ICD-10-CM | POA: Diagnosis not present

## 2019-10-01 DIAGNOSIS — R3121 Asymptomatic microscopic hematuria: Secondary | ICD-10-CM | POA: Diagnosis not present

## 2019-10-01 DIAGNOSIS — N132 Hydronephrosis with renal and ureteral calculous obstruction: Secondary | ICD-10-CM | POA: Diagnosis not present

## 2019-10-05 MED FILL — TERBINAFINE HCL 250 MG TAB: 250 | 84 days supply | Qty: 12 | Fill #0

## 2019-10-11 ENCOUNTER — Other Ambulatory Visit: Payer: Self-pay | Admitting: Urology

## 2019-10-11 DIAGNOSIS — R3121 Asymptomatic microscopic hematuria: Secondary | ICD-10-CM | POA: Diagnosis not present

## 2019-10-11 DIAGNOSIS — N201 Calculus of ureter: Secondary | ICD-10-CM | POA: Diagnosis not present

## 2019-10-13 NOTE — H&P (Signed)
Office Visit Report     10/11/2019   --------------------------------------------------------------------------------   Duane Greene  MRN: [id number]  DOB: Apr 24, 1951, 68 year old Male  SSN: [id number]   PRIMARY CARE:  R Marcellus Scott, MD  REFERRING:  R Marcellus Scott, MD  PROVIDER:  Louis Meckel, M.D.  LOCATION:  [facility] [phone]     --------------------------------------------------------------------------------   CC: Microscopic hematuria evaluation  HPI: Duane Greene is a 68 year-old male patient who was referred by Dr. Ria Bush, MD who is here for further evaluation of microscopic hematuria.  The patient was last seen 2 weeks prior.   The patient had cystoscopy at their last visit. The cystoscopic evaluation revealed NED.   He has had a CT scan within the last 12 months.   The patient denies any progression of his voiding symptoms. The patient complains of recent onset back pain.   The patient does not have a history of recurrent UTIs. They have a history of kidney stones. He has not been exposed to occupational hazards that may increase their risks for developing cancer. The patient has no family history of GU malignancy.   The patient has not seen noted blood in his urine since the last visit.   Patient with the ports 1 small episode of left-sided lateral abdominal pain. The significant lower urinary tract symptoms. No hematuria or dysuria. No fevers or chills.   The patient's CT scan demonstrated a 7.5 mm left proximal/mid ureteral stone. There are no additional stones or other findings to explain is hematuria.     ALLERGIES: No Allergies    MEDICATIONS: Simvastatin 40 mg tablet  Adult Low Dose Aspirin Ec 81 mg tablet, delayed release  Aspirin 81 MG TABS Oral  Diphenhydramine Hcl 25 mg capsule  Multiple Vitamins tablet Oral  Terbinafine Hcl 250 mg tablet     GU PSH: Cysto Bladder Stone <2.5cm - 2010 Cystoscopy -  09/21/2019 Cystoscopy And Treatment - 2010 Locm 300-399Mg /Ml Iodine,1Ml - 10/01/2019       PSH Notes: Cystoscopy With Fragmentation Of Bladder Calculus, Knee Arthroscopy, Shoulder Surgery, Cystoscopy With Manipulation Of Ureteral Calculus   NON-GU PSH: Hernia Repair, 2020 Shoulder Surgery (Unspecified), Bilateral, Bone spur     GU PMH: Microscopic hematuria - 09/21/2019 Renal calculus, Nephrolithiasis - 2014 Bladder Stone, Bladder calculus - 2014 History of urolithiasis, Nephrolithiasis - 2014 Other microscopic hematuria, Microscopic hematuria - 2014    NON-GU PMH: Encounter for general adult medical examination without abnormal findings, Encounter for preventive health examination - 2014 Personal history of other diseases of the nervous system and sense organs, History of sleep apnea - 2014 Personal history of other endocrine, nutritional and metabolic disease, History of hypercholesterolemia - 2014 Hypercholesterolemia Sleep Apnea    FAMILY HISTORY: 1 Daughter - Daughter Diabetes - Brother Family Health Status Number - Runs In Family Father Deceased At Age86 ___ - Runs In Family Heart Disease - Father, Brother Mother Deceased At Age 53 from diabetic complicati - Runs In Family    Notes: Mother and father deceased   SOCIAL HISTORY: Marital Status: Married Preferred Language: English; Ethnicity: Not Hispanic Or Latino; Race: White Current Smoking Status: Patient has never smoked.   Tobacco Use Assessment Completed: Used Tobacco in last 30 days? Has never drank.  Drinks 2 caffeinated drinks per day.     Notes: Never a smoker, Marital History - Currently Married, Tobacco Use, Alcohol Use, Occupation:, Caffeine Use   REVIEW OF SYSTEMS:  GU Review Male:   Patient denies frequent urination, hard to postpone urination, burning/ pain with urination, get up at night to urinate, leakage of urine, stream starts and stops, trouble starting your stream, have to strain to urinate ,  erection problems, and penile pain.  Gastrointestinal (Upper):   Patient denies nausea, vomiting, and indigestion/ heartburn.  Gastrointestinal (Lower):   Patient denies diarrhea and constipation.  Constitutional:   Patient denies fever, night sweats, weight loss, and fatigue.  Skin:   Patient denies skin rash/ lesion and itching.  Eyes:   Patient denies blurred vision and double vision.  Ears/ Nose/ Throat:   Patient denies sore throat and sinus problems.  Hematologic/Lymphatic:   Patient denies swollen glands and easy bruising.  Cardiovascular:   Patient denies leg swelling and chest pains.  Respiratory:   Patient denies cough and shortness of breath.  Endocrine:   Patient denies excessive thirst.  Musculoskeletal:   Patient denies back pain and joint pain.  Neurological:   Patient denies headaches and dizziness.  Psychologic:   Patient denies depression and anxiety.   VITAL SIGNS:      10/11/2019 12:16 PM  Weight 205 lb / 92.99 kg  Height 70 in / 177.8 cm  BP 169/82 mmHg  Heart Rate 80 /min  Temperature 98.4 F / 36.8 C  BMI 29.4 kg/m   GU PHYSICAL EXAMINATION:      Notes: Mild left lateral/anterior axillary line tenderness to palpation  No CVA tenderness on the left   MULTI-SYSTEM PHYSICAL EXAMINATION:    Constitutional: Well-nourished. No physical deformities. Normally developed. Good grooming.  Neck: Neck symmetrical, not swollen. Normal tracheal position.  Respiratory: Normal breath sounds. No labored breathing, no use of accessory muscles.   Cardiovascular: Regular rate and rhythm. No murmur, no gallop. Normal temperature, normal extremity pulses, no swelling, no varicosities.   Lymphatic: No enlargement of neck, axillae, groin.  Skin: No paleness, no jaundice, no cyanosis. No lesion, no ulcer, no rash.  Neurologic / Psychiatric: Oriented to time, oriented to place, oriented to person. No depression, no anxiety, no agitation.  Gastrointestinal: No mass, no tenderness, no  rigidity, non obese abdomen.  Eyes: Normal conjunctivae. Normal eyelids.  Ears, Nose, Mouth, and Throat: Left ear no scars, no lesions, no masses. Right ear no scars, no lesions, no masses. Nose no scars, no lesions, no masses. Normal hearing. Normal lips.  Musculoskeletal: Normal gait and station of head and neck.     PAST DATA REVIEWED:  Source Of History:  Patient  Records Review:   Previous Doctor Records, Previous Patient Records, POC Tool  X-Ray Review: C.T. Abdomen/Pelvis: Reviewed Films. Discussed With Patient.     PROCEDURES:         KUB - [id number]  A single view of the abdomen is obtained. Renal shadows are easily visualized bilaterally. There are no stones appreciated within the expected location in either renal pelvis. The patient's known stone is easily visualized in the left proximal/mid ureter. There are no additional calcifications along the expected location of either ureter bilaterally.  Gas pattern is grossly normal. No significant bony abnormalities.      Impression: The patient's stone has not moved from his initial CT scan.  Patient confirmed No Neulasta OnPro Device.            Urinalysis w/Scope Dipstick Dipstick Cont'd Micro  Color: Yellow Bilirubin: Neg mg/dL WBC/hpf: 0 - 5/hpf  Appearance: Clear Ketones: Neg mg/dL RBC/hpf: 10 - 41/YSA  Specific Gravity: 1.020  Blood: 1+ ery/uL Bacteria: Rare (0-9/hpf)  pH: 6.5 Protein: Neg mg/dL Cystals: NS (Not Seen)  Glucose: Neg mg/dL Urobilinogen: 0.2 mg/dL Casts: NS (Not Seen)    Nitrites: Neg Trichomonas: Not Present    Leukocyte Esterase: Trace leu/uL Mucous: Not Present      Epithelial Cells: 0 - 5/hpf      Yeast: NS (Not Seen)      Sperm: Not Present    ASSESSMENT:      ICD-10 Details  1 GU:   Microscopic hematuria - R31.21    PLAN:           Orders X-Rays: KUB          Schedule         Document Letter(s):  Created for Patient: Clinical Summary         Notes:   The patient has a stone that  measures 7.5 x 5 mm and is in the left proximal ureter. It is easily visualized with plain x-ray. Has Hounsfield units of between 6 and 800 and the skin this stone distance is approximately 13 cm. We discussed treatment options including medical expulsion therapy, ureteroscopy, and shock wave lithotripsy. We discussed management options including medical expulsion therapy, shockwave lithotripsy, and ureteroscopy. Ultimately, the patient has opted for shock wave lithotripsy. I discussed with the patient the procedure in detail as well as the risk and benefits. The patient is aware that she may need additional procedures. She also is aware of the risks of hematoma and pain. We will try to get this patient's scheduled as soon as possible.   cc: Johnella MoloneyNeville Gates, MD        Next Appointment:      Next Appointment: 10/18/2019 10:00 AM    Appointment Type: Surgery     Location: [facility] [phone]    Provider: Heloise PurpuraLester Shamieka Gullo, M.D.    Reason for Visit: OP WL LT ESWL      * Signed by Berniece SalinesBenjamin Herrick, M.D. on 10/11/19 at 4:49 PM (EST)*

## 2019-10-18 ENCOUNTER — Encounter (HOSPITAL_COMMUNITY)
Admission: RE | Disposition: A | Discharge status: Home / Self Care | Payer: Self-pay | Source: Other Acute Inpatient Hospital | Attending: Urology

## 2019-10-18 ENCOUNTER — Ambulatory Visit (HOSPITAL_COMMUNITY)
Admission: RE | Admit: 2019-10-18 | Discharge: 2019-10-18 | Disposition: A | Discharge status: Home / Self Care | Payer: PPO | Source: Other Acute Inpatient Hospital | Attending: Urology | Admitting: Urology

## 2019-10-18 ENCOUNTER — Ambulatory Visit (HOSPITAL_COMMUNITY): Payer: PPO

## 2019-10-18 ENCOUNTER — Encounter (HOSPITAL_COMMUNITY): Payer: Self-pay | Admitting: General Practice

## 2019-10-18 DIAGNOSIS — Z8249 Family history of ischemic heart disease and other diseases of the circulatory system: Secondary | ICD-10-CM | POA: Insufficient documentation

## 2019-10-18 DIAGNOSIS — E7800 Pure hypercholesterolemia, unspecified: Secondary | ICD-10-CM | POA: Insufficient documentation

## 2019-10-18 DIAGNOSIS — Z7982 Long term (current) use of aspirin: Secondary | ICD-10-CM | POA: Insufficient documentation

## 2019-10-18 DIAGNOSIS — G4730 Sleep apnea, unspecified: Secondary | ICD-10-CM | POA: Insufficient documentation

## 2019-10-18 DIAGNOSIS — N201 Calculus of ureter: Secondary | ICD-10-CM | POA: Insufficient documentation

## 2019-10-18 DIAGNOSIS — Z79899 Other long term (current) drug therapy: Secondary | ICD-10-CM | POA: Insufficient documentation

## 2019-10-18 DIAGNOSIS — R3129 Other microscopic hematuria: Secondary | ICD-10-CM | POA: Diagnosis not present

## 2019-10-18 DIAGNOSIS — N200 Calculus of kidney: Secondary | ICD-10-CM | POA: Diagnosis not present

## 2019-10-18 DIAGNOSIS — Z87442 Personal history of urinary calculi: Secondary | ICD-10-CM | POA: Diagnosis not present

## 2019-10-18 HISTORY — PX: EXTRACORPOREAL SHOCK WAVE LITHOTRIPSY: SHX1557

## 2019-10-18 SURGERY — LITHOTRIPSY, ESWL
Anesthesia: LOCAL | Laterality: Left

## 2019-10-18 MED ORDER — DIPHENHYDRAMINE HCL 25 MG PO CAPS
25.0000 mg | ORAL_CAPSULE | ORAL | Status: AC
  Administered 2019-10-18: 09:00:00 25 mg via ORAL
  Filled 2019-10-18: qty 1

## 2019-10-18 MED ORDER — SODIUM CHLORIDE 0.9 % IV SOLN
INTRAVENOUS | Status: DC
  Administered 2019-10-18: 09:00:00 via INTRAVENOUS

## 2019-10-18 MED ORDER — TRAMADOL HCL 50 MG PO TABS: 50.0000 mg | ORAL_TABLET | Freq: Four times a day (QID) | ORAL | 0 refills | Status: DC | PRN

## 2019-10-18 MED ORDER — ONDANSETRON HCL 4 MG PO TABS: 4.0000 mg | ORAL_TABLET | Freq: Three times a day (TID) | ORAL | 0 refills | Status: DC | PRN

## 2019-10-18 MED ORDER — LEVOFLOXACIN 500 MG PO TABS
500.0000 mg | ORAL_TABLET | ORAL | Status: AC
  Administered 2019-10-18: 500 mg via ORAL
  Filled 2019-10-18: qty 1

## 2019-10-18 MED ORDER — DIAZEPAM 5 MG PO TABS
10.0000 mg | ORAL_TABLET | ORAL | Status: AC
  Administered 2019-10-18: 09:00:00 10 mg via ORAL
  Filled 2019-10-18: qty 2

## 2019-10-18 MED ORDER — TAMSULOSIN HCL 0.4 MG PO CAPS: 0.4000 mg | ORAL_CAPSULE | Freq: Every day | ORAL | 0 refills | Status: DC

## 2019-10-18 MED FILL — ONDANSETRON HCL 4 MG TABLET: 4 | 3 days supply | Qty: 8 | Fill #0

## 2019-10-18 MED FILL — TAMSULOSIN HCL 0.4 MG CAP: 0.4 | 14 days supply | Qty: 14 | Fill #0

## 2019-10-18 MED FILL — traMADol HCL 50 MG TABS: 50 | 3 days supply | Qty: 20 | Fill #0

## 2019-10-18 NOTE — Interval H&P Note (Signed)
History and Physical Interval Note:  10/18/2019 8:46 AM  Duane Greene  has presented today for surgery, with the diagnosis of LEFT MID URETERAL STONE.  The various methods of treatment have been discussed with the patient and family. After consideration of risks, benefits and other options for treatment, the patient has consented to  Procedure(s): EXTRACORPOREAL SHOCK WAVE LITHOTRIPSY (ESWL) (Left) as a surgical intervention.  The patient's history has been reviewed, patient examined, no change in status, stable for surgery.  I have reviewed the patient's chart and labs.  Questions were answered to the patient's satisfaction.     [employer]

## 2019-10-18 NOTE — Discharge Instructions (Signed)
1. You should strain your urine and collect all fragments and bring them to your follow up appointment.  °2. You should take your pain medication as needed.  Please call if your pain is severe to the point that it is not controlled with your pain medication. °3. You should call if you develop fever > 101 or persistent nausea or vomiting. °4. Your doctor may prescribe tamsulosin to take to help facilitate stone passage. °

## 2019-10-18 NOTE — Interval H&P Note (Signed)
History and Physical Interval Note:  10/18/2019 9:37 AM  Duane Greene  has presented today for surgery, with the diagnosis of LEFT MID URETERAL STONE.  The various methods of treatment have been discussed with the patient and family. After consideration of risks, benefits and other options for treatment, the patient has consented to  Procedure(s): EXTRACORPOREAL SHOCK WAVE LITHOTRIPSY (ESWL) (Left) as a surgical intervention.  The patient's history has been reviewed, patient examined, no change in status, stable for surgery.  I have reviewed the patient's chart and labs.  Questions were answered to the patient's satisfaction.     [employer]

## 2019-10-18 NOTE — Op Note (Signed)
See [facility] operative note scanned into chart. Also because of the size, density, location and other factors that cannot be anticipated I feel this will likely be a staged procedure. This fact supersedes any indication in the scanned [facility] operative note to the contrary.  

## 2019-10-19 ENCOUNTER — Encounter (HOSPITAL_COMMUNITY): Payer: Self-pay | Admitting: Urology

## 2019-10-22 DIAGNOSIS — G4733 Obstructive sleep apnea (adult) (pediatric): Secondary | ICD-10-CM | POA: Diagnosis not present

## 2019-11-02 DIAGNOSIS — R3121 Asymptomatic microscopic hematuria: Secondary | ICD-10-CM | POA: Diagnosis not present

## 2019-11-02 DIAGNOSIS — N200 Calculus of kidney: Secondary | ICD-10-CM | POA: Diagnosis not present

## 2019-12-24 DIAGNOSIS — R21 Rash and other nonspecific skin eruption: Secondary | ICD-10-CM | POA: Diagnosis not present

## 2019-12-24 MED FILL — SIMVASTATIN 80 MG TABLET: 80 | 90 days supply | Qty: 90 | Fill #1

## 2019-12-24 MED FILL — TRIAMCINOLONE 0.1% CREAM: 0.1 | 10 days supply | Qty: 30 | Fill #0

## 2020-01-03 MED FILL — TERBINAFINE HCL 250 MG TAB: 250 | 84 days supply | Qty: 12 | Fill #1

## 2020-02-04 DIAGNOSIS — G4733 Obstructive sleep apnea (adult) (pediatric): Secondary | ICD-10-CM | POA: Diagnosis not present

## 2020-02-29 DIAGNOSIS — R1011 Right upper quadrant pain: Secondary | ICD-10-CM | POA: Diagnosis not present

## 2020-02-29 MED FILL — oxyCODONE HCL 5 MG TABS: 5 | 3 days supply | Qty: 10 | Fill #0

## 2020-03-02 DIAGNOSIS — G4733 Obstructive sleep apnea (adult) (pediatric): Secondary | ICD-10-CM | POA: Diagnosis not present

## 2020-03-17 DIAGNOSIS — G4733 Obstructive sleep apnea (adult) (pediatric): Secondary | ICD-10-CM | POA: Diagnosis not present

## 2020-03-21 MED FILL — TERBINAFINE HCL 250 MG TAB: 250 | 84 days supply | Qty: 12 | Fill #2

## 2020-04-21 ENCOUNTER — Other Ambulatory Visit: Payer: Self-pay

## 2020-04-21 ENCOUNTER — Emergency Department (HOSPITAL_COMMUNITY): Payer: PPO

## 2020-04-21 ENCOUNTER — Encounter (HOSPITAL_COMMUNITY): Payer: Self-pay | Admitting: Emergency Medicine

## 2020-04-21 ENCOUNTER — Emergency Department (HOSPITAL_COMMUNITY)
Admission: EM | Admit: 2020-04-21 | Discharge: 2020-04-21 | Disposition: A | Discharge status: Home / Self Care | Payer: PPO | Attending: Emergency Medicine | Admitting: Emergency Medicine

## 2020-04-21 DIAGNOSIS — R1013 Epigastric pain: Secondary | ICD-10-CM

## 2020-04-21 DIAGNOSIS — Z79899 Other long term (current) drug therapy: Secondary | ICD-10-CM | POA: Insufficient documentation

## 2020-04-21 DIAGNOSIS — K8080 Other cholelithiasis without obstruction: Secondary | ICD-10-CM | POA: Diagnosis not present

## 2020-04-21 DIAGNOSIS — K8020 Calculus of gallbladder without cholecystitis without obstruction: Secondary | ICD-10-CM | POA: Diagnosis not present

## 2020-04-21 LAB — COMPREHENSIVE METABOLIC PANEL
ALT: 21 U/L (ref 0–44)
AST: 25 U/L (ref 15–41)
Albumin: 4.2 g/dL (ref 3.5–5.0)
Alkaline Phosphatase: 81 U/L (ref 38–126)
Anion gap: 12 (ref 5–15)
BUN: 14 mg/dL (ref 8–23)
CO2: 23 mmol/L (ref 22–32)
Calcium: 9.4 mg/dL (ref 8.9–10.3)
Chloride: 106 mmol/L (ref 98–111)
Creatinine, Ser: 0.89 mg/dL (ref 0.61–1.24)
GFR calc Af Amer: >60 mL/min (ref >60)
GFR calc non Af Amer: >60 mL/min (ref >60)
Glucose, Bld: 117 mg/dL — ABNORMAL HIGH (ref 70–99)
Potassium: 3.7 mmol/L (ref 3.5–5.1)
Sodium: 141 mmol/L (ref 135–145)
Total Bilirubin: 0.7 mg/dL (ref 0.3–1.2)
Total Protein: 7.3 g/dL (ref 6.5–8.1)

## 2020-04-21 LAB — CBC
HCT: 47.7 % (ref 39.0–52.0)
Hemoglobin: 16.2 g/dL (ref 13.0–17.0)
MCH: 32.4 pg (ref 26.0–34.0)
MCHC: 34 g/dL (ref 30.0–36.0)
MCV: 95.4 fL (ref 80.0–100.0)
Platelets: 214 10*3/uL (ref 150–400)
RBC: 5 MIL/uL (ref 4.22–5.81)
RDW: 12.6 % (ref 11.5–15.5)
WBC: 6.4 10*3/uL (ref 4.0–10.5)
nRBC: 0 % (ref 0.0–0.2)

## 2020-04-21 LAB — URINALYSIS, ROUTINE W REFLEX MICROSCOPIC
Bilirubin Urine: NEGATIVE
Glucose, UA: NEGATIVE mg/dL
Hgb urine dipstick: NEGATIVE
Ketones, ur: NEGATIVE mg/dL
Leukocytes,Ua: NEGATIVE
Nitrite: NEGATIVE
Protein, ur: NEGATIVE mg/dL
Specific Gravity, Urine: 1.019 (ref 1.005–1.030)
pH: 5 (ref 5.0–8.0)

## 2020-04-21 LAB — LIPASE, BLOOD: Lipase: 34 U/L (ref 11–51)

## 2020-04-21 MED ORDER — ONDANSETRON HCL 4 MG PO TABS
4.0000 mg | ORAL_TABLET | Freq: Three times a day (TID) | ORAL | 0 refills | Status: DC | PRN
Start: 2020-04-21 — End: 2020-06-05

## 2020-04-21 MED ORDER — SODIUM CHLORIDE 0.9% FLUSH: 3.0000 mL | Freq: Once | INTRAVENOUS | Status: DC

## 2020-04-21 NOTE — ED Notes (Signed)
Pt ambulatory to and from restroom with steady gait 

## 2020-04-21 NOTE — ED Triage Notes (Signed)
Patient reports mid/upper abdominal pain radiating to mid back onset 12 midnight with persistent belching , no emesis or diarrhea , denies fever or chills .

## 2020-04-21 NOTE — ED Provider Notes (Signed)
[facility] EMERGENCY DEPARTMENT Provider Note   CSN: [id number] Arrival date & time: 04/21/20  3235     History Chief Complaint  Patient presents with  . Abdominal Pain    Duane Greene is a 69 y.o. male.  The history is provided by the patient and medical records. No language interpreter was used.  Abdominal Pain Pain location:  Epigastric and RUQ Pain quality: aching and pressure   Pain radiates to:  Does not radiate Pain severity:  Severe Onset quality:  Gradual Duration:  6 hours Timing:  Rare Progression:  Resolved Chronicity:  Recurrent Context: eating   Context: not previous surgeries, not recent illness and not suspicious food intake   Relieved by:  Nothing Worsened by:  Nothing Ineffective treatments:  None tried Associated symptoms: anorexia and nausea (resolved)   Associated symptoms: no chest pain, no chills, no constipation, no cough, no diarrhea, no dysuria, no fatigue, no fever, no flatus, no shortness of breath and no vomiting   Risk factors: has not had multiple surgeries        Past Medical History:  Diagnosis Date  . Hyperlipidemia   . Right inguinal hernia   . Sleep apnea    uses CPAP nightly    Patient Active Problem List   Diagnosis Date Noted  . L knee weakness 10/29/2016  . Benign positional vertigo 08/21/2012  . 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 TALIPES CAVUS 08/07/2010  . 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 LEG PAIN, BILATERAL 06/26/2010    Past Surgical History:  Procedure Laterality Date  . COLONOSCOPY    . EXTRACORPOREAL SHOCK WAVE LITHOTRIPSY Left 10/18/2019   Procedure: EXTRACORPOREAL SHOCK WAVE LITHOTRIPSY (ESWL);  Surgeon: Heloise Purpura, MD;  Location: [facility];  Service: Urology;  Laterality: Left;  . INGUINAL HERNIA REPAIR Right 06/30/2019   Procedure: RIGHT INGUINAL HERNIA REPAIR WITH MESH;  Surgeon: Manus Rudd, MD;  Location: [facility];  Service: General;   Laterality: Right;  . KNEE ARTHROSCOPY Left   . SHOULDER ARTHROSCOPY Bilateral        No family history on file.  Social History   Tobacco Use  . Smoking status: Never Smoker  . Smokeless tobacco: Never Used  Substance Use Topics  . Alcohol use: Never  . Drug use: Never    Home Medications Prior to Admission medications   Medication Sig Start Date End Date Taking? Authorizing Provider  ondansetron (ZOFRAN) 4 MG tablet Take 1 tablet (4 mg total) by mouth every 8 (eight) hours as needed for nausea or vomiting. 10/18/19   Heloise Purpura, MD  simvastatin (ZOCOR) 40 MG tablet Take 40 mg by mouth daily.    [provider]  tamsulosin (FLOMAX) 0.4 MG CAPS capsule Take 1 capsule (0.4 mg total) by mouth at bedtime. 10/18/19   Heloise Purpura, MD  terbinafine (LAMISIL) 250 MG tablet once a week.  08/07/16   [provider]  traMADol (ULTRAM) 50 MG tablet Take 1-2 tablets (50-100 mg total) by mouth every 6 (six) hours as needed (pain). 10/18/19   Heloise Purpura, MD    Allergies    Patient has no known allergies.  Review of Systems   Review of Systems  Constitutional: Negative for chills, diaphoresis, fatigue and fever.  HENT: Negative for congestion.   Eyes: Negative for visual disturbance.  Respiratory: Negative for cough, chest tightness, shortness of breath, wheezing and stridor.   Cardiovascular: Negative for chest pain, palpitations and leg swelling.  Gastrointestinal: Positive for abdominal pain, anorexia and nausea (resolved). Negative for abdominal distention, constipation, diarrhea, flatus and vomiting.  Genitourinary: Negative for dysuria, flank pain and frequency.  Musculoskeletal: Negative for back pain, neck pain and neck stiffness.  Skin: Negative for rash and wound.  Neurological: Negative for light-headedness and headaches.  Psychiatric/Behavioral: Negative for agitation and confusion.  All other systems reviewed and are negative.   Physical  Exam Updated Vital Signs BP (!) 150/96 (BP Location: Left Arm)   Pulse 63   Temp (!) 97.5 F (36.4 C) (Oral)   Resp 16   Ht 5\' 10"  (1.778 m)   Wt 102 kg   SpO2 100%   BMI 32.27 kg/m   Physical Exam Vitals and nursing note reviewed.  Constitutional:      General: He is not in acute distress.    Appearance: He is well-developed. He is not ill-appearing, toxic-appearing or diaphoretic.  HENT:     Head: Normocephalic and atraumatic.  Eyes:     Extraocular Movements: Extraocular movements intact.     Conjunctiva/sclera: Conjunctivae normal.  Cardiovascular:     Rate and Rhythm: Normal rate and regular rhythm.     Heart sounds: Normal heart sounds. No murmur.  Pulmonary:     Effort: Pulmonary effort is normal. No respiratory distress.     Breath sounds: Normal breath sounds. No wheezing, rhonchi or rales.  Chest:     Chest wall: No tenderness.  Abdominal:     General: Bowel sounds are normal.     Palpations: Abdomen is soft.     Tenderness: There is no abdominal tenderness. There is no right CVA tenderness, left CVA tenderness, guarding or rebound.  Musculoskeletal:     Cervical back: Neck supple.  Skin:    General: Skin is warm and dry.  Neurological:     General: No focal deficit present.     Mental Status: He is alert.  Psychiatric:        Mood and Affect: Mood normal.     ED Results / Procedures / Treatments   Labs (all labs ordered are listed, but only abnormal results are displayed) Labs Reviewed  COMPREHENSIVE METABOLIC PANEL - Abnormal; Notable for the following components:      Result Value   Glucose, Bld 117 (*)    All other components within normal limits  LIPASE, BLOOD  CBC  URINALYSIS, ROUTINE W REFLEX MICROSCOPIC    EKG None  Radiology Abdomen Limited RUQ  Result Date: 04/21/2020 CLINICAL DATA:  Epigastric abdominal pain since midnight EXAM: ULTRASOUND ABDOMEN LIMITED RIGHT UPPER QUADRANT COMPARISON:  CT of the abdomen and pelvis from  10/01/2019 FINDINGS: Gallbladder: 1.2 cm gallbladder calculus in the neck of the gallbladder. Mild gallbladder wall thickening at 4 mm without pericholecystic fluid or reported tenderness over the gallbladder. Common bile duct: Diameter: 2.7 mm Liver: LEFT lobe of the liver with limited visualization due to overlying bowel gas. No focal lesion on limited assessment with mildly increased echogenicity in hepatic parenchyma. Portal vein is patent on color Doppler imaging with normal direction of blood flow towards the liver. Other: None. IMPRESSION: Mild gallbladder wall thickening with moderately large gallstone in the neck of the gallbladder but without reported tenderness over the gallbladder. Findings are equivocal. If there is concern for cholecystitis HIDA scan may be helpful for further evaluation. Electronically Signed   By: 10/03/2019 M.D.   On: 04/21/2020 10:05    Procedures Procedures (including critical care time)  Medications Ordered  in ED Medications  sodium chloride flush (NS) 0.9 % injection 3 mL (has no administration in time range)    ED Course  I have reviewed the triage vital signs and the nursing notes.  Pertinent labs & imaging results that were available during my care of the patient were reviewed by me and considered in my medical decision making (see chart for details).    MDM Rules/Calculators/A&P                      Duane Greene is a 69 y.o. male with a past medical history significant for hyperlipidemia, prior kidney stones status post lithotripsy, and known gallstones who presents with epigastric and right upper quadrant abdominal discomfort overnight.  Patient reports that he had a hamburger feel good loss last night before going to bed went to bed about midnight.  He reports around 3:30 AM, he started having pain in the epigastrium and right upper quadrant.  He describes the pain as severe and not respond to his home oxycodone which he is taking the past.  He  says that around 6 months ago he had a similar discomfort and had a work-up at his PCP including lab work that was reassuring.  He was given some pain medicine and never returned.  He says that the last time he had eaten greasy food before that episode.  He has never had his gallbladder assessed officially but he does say that he was told he had gallstones when he was worked up for kidney stones in the past.  He reports no significant nausea and vomiting does report increased belching overnight.  He denies any recent constipation, diarrhea, or urinary symptoms.  No recent fevers, chills, chest pain, shortness of breath, or cough.  No recent Covid symptoms otherwise.  He reports the pain is an aching pain that would wax and wane and is now resolved.  On exam, patient did not have any tenderness on exam and had normal bowel sounds.  No CVA tenderness or back tenderness.  No chest tenderness.  No murmur.  Lungs clear.  Good pulses in lower extremities.  Normal sensation and strength in extremities.  Patient otherwise well-appearing and resting.  Had a shared decision made conversation with patient.  He had some blood work in triage that was reassuring in regards to urine, CBC, CMP, and lipase.  Given the location of discomfort and his known gallstones with this pain, I am somewhat concerned either symptomatic cholelithiasis or acute cholecystitis that is not giving lab abnormalities.  We also discussed the possibility of an aortic dissection as pain can be in the front and then go to the back however he is having ongoing chest symptoms, has good pulses in lower extremities, and does not have high blood pressure.  Patient agrees with foregoing any CTA at this time and instead doing the ultrasound.  As he is having no symptoms, we will hold on medications.  Anticipate reassessment after ultrasound.  10:12 AM Ultrasound does show a large gallstone in the neck of the gallbladder with some gallbladder wall  thickening.  There was no tenderness so they feel the findings are equivocal for cholecystitis.  If there was concern, they recommended HIDA scan may be more helpful.  At minimum I suspect he is having symptomatic cholelithiasis versus early cholecystitis.  Will call general surgery for consultation of further management.  General surgery assessed the ultrasound and they feel he is very appropriate for outpatient follow-up  as he is currently in no distress and had reassuring blood work.  Patient agrees with plan of care and he will be given contact information for general surgery who is going to help schedule an appointment.  He understands return precautions for any new or worsened symptoms.  He had no questions or concerns and was discharged in good condition.   Final Clinical Impression(s) / ED Diagnoses Final diagnoses:  Epigastric abdominal pain  Symptomatic cholelithiasis  Calculus of gallbladder without cholecystitis without obstruction    Rx / DC Orders ED Discharge Orders         Ordered    ondansetron (ZOFRAN) 4 MG tablet  Every 8 hours PRN     04/21/20 1128          Clinical Impression: 1. Symptomatic cholelithiasis   2. Epigastric abdominal pain   3. Calculus of gallbladder without cholecystitis without obstruction     Disposition: Discharge  Condition: Good  I have discussed the results, Dx and Tx plan with the pt(& family if present). He/she/they expressed understanding and agree(s) with the plan. Discharge instructions discussed at great length. Strict return precautions discussed and pt &/or family have verbalized understanding of the instructions. No further questions at time of discharge.    Discharge Medication List as of 04/21/2020 11:29 AM    START taking these medications   Details  !! ondansetron (ZOFRAN) 4 MG tablet Take 1 tablet (4 mg total) by mouth every 8 (eight) hours as needed for nausea or vomiting., Starting Fri 04/21/2020, Print     !! -  Potential duplicate medications found. Please discuss with provider.      Follow Up: Berna Bue, MD [address] [place] [address] [phone]  Go on 05/03/2020 Your appointment is 6/16 at 9:30am to discuss gallbladder surgery. Please arrive 15 minutes early to check in.     Pamella Samons, Canary Brim, MD 04/21/20 [id number]

## 2020-04-21 NOTE — Discharge Instructions (Addendum)
Gallbladder Eating Plan If you have a gallbladder condition, you may have trouble digesting fats. Eating a low-fat diet can help reduce your symptoms, and may be helpful before and after having surgery to remove your gallbladder (cholecystectomy). Your health care provider may recommend that you work with a diet and nutrition specialist (dietitian) to help you reduce the amount of fat in your diet. What are tips for following this plan? General guidelines Limit your fat intake to less than 30% of your total daily calories. If you eat around 1,800 calories each day, this is less than 60 grams (g) of fat per day. Fat is an important part of a healthy diet. Eating a low-fat diet can make it hard to maintain a healthy body weight. Ask your dietitian how much fat, calories, and other nutrients you need each day. Eat small, frequent meals throughout the day instead of three large meals. Drink at least 8-10 cups of fluid a day. Drink enough fluid to keep your urine clear or pale yellow. Limit alcohol intake to no more than 1 drink a day for nonpregnant women and 2 drinks a day for men. One drink equals 12 oz of beer, 5 oz of wine, or 1 oz of hard liquor. Reading food labels Check Nutrition Facts on food labels for the amount of fat per serving. Choose foods with less than 3 grams of fat per serving. Shopping Choose nonfat and low-fat healthy foods. Look for the words nonfat, low fat, or fat free. Avoid buying processed or prepackaged foods. Cooking Cook using low-fat methods, such as baking, broiling, grilling, or boiling. Cook with small amounts of healthy fats, such as olive oil, grapeseed oil, canola oil, or sunflower oil. What foods are recommended?  All fresh, frozen, or canned fruits and vegetables. Whole grains. Low-fat or non-fat (skim) milk and yogurt. Lean meat, skinless poultry, fish, eggs, and beans. Low-fat protein supplement powders or drinks. Spices and herbs. What foods are  not recommended? High-fat foods. These include baked goods, fast food, fatty cuts of meat, ice cream, french toast, sweet rolls, pizza, cheese bread, foods covered with butter, creamy sauces, or cheese. Fried foods. These include french fries, tempura, battered fish, breaded chicken, fried breads, and sweets. Foods with strong odors. Foods that cause bloating and gas. Summary A low-fat diet can be helpful if you have a gallbladder condition, or before and after gallbladder surgery. Limit your fat intake to less than 30% of your total daily calories. This is about 60 g of fat if you eat 1,800 calories each day. Eat small, frequent meals throughout the day instead of three large meals. This information is not intended to replace advice given to you by your health care provider. Make sure you discuss any questions you have with your health care provider. Document Revised: 02/25/2019 Document Reviewed: 12/12/2016 Elsevier Patient Education  2020 [employer].   Your work-up today was suggestive of gallstones causing your abdominal discomfort.  Your laboratory testing was very reassuring and the ultrasound not show convincing evidence of acute cholecystitis today.  We spoke with general surgery who wants to follow-up with you in clinic and discuss further management.  If any symptoms develop or change or worsen, please return to the nearest emergency department immediately.  Please rest and stay hydrated.

## 2020-04-21 NOTE — ED Notes (Signed)
Patient verbalizes understanding of discharge instructions. Opportunity for questioning and answers were provided. Pt discharged from ED. 

## 2020-04-26 DIAGNOSIS — H2513 Age-related nuclear cataract, bilateral: Secondary | ICD-10-CM | POA: Diagnosis not present

## 2020-04-26 DIAGNOSIS — H5340 Unspecified visual field defects: Secondary | ICD-10-CM | POA: Diagnosis not present

## 2020-04-26 DIAGNOSIS — H5203 Hypermetropia, bilateral: Secondary | ICD-10-CM | POA: Diagnosis not present

## 2020-04-26 DIAGNOSIS — H4720 Unspecified optic atrophy: Secondary | ICD-10-CM | POA: Diagnosis not present

## 2020-05-05 ENCOUNTER — Ambulatory Visit: Payer: Self-pay | Admitting: Surgery

## 2020-05-05 DIAGNOSIS — K8010 Calculus of gallbladder with chronic cholecystitis without obstruction: Secondary | ICD-10-CM | POA: Diagnosis not present

## 2020-05-06 ENCOUNTER — Ambulatory Visit: Payer: Self-pay | Admitting: Surgery

## 2020-05-06 NOTE — H&P (Signed)
History of Present Illness Duane Greene. Duane Whiters MD; 05/06/2020 1:00 PM) The patient is a 69 year old male who presents for evaluation of gall stones. PCP - Duane Greene  This is a 69 year old male s/p RIH repair with mesh 8/20 who presents after recent ED visit. The patient had documented gallstones seen on a CT scan for kidney stones, but previously had no symptoms. He presented to the ED last week with acute onset of epigastric/ RUQ pain radiating into his back. He did have some nausea and bloating. He underwent work-up in the ED that showed normal LFT's and a 1.2 cm gallstone in the neck of the gallbladder. He has been asymptomatic since that time. He presents now for discussion of cholecystectomy.  CLINICAL DATA: Epigastric abdominal pain since midnight  EXAM: ULTRASOUND ABDOMEN LIMITED RIGHT UPPER QUADRANT  COMPARISON: CT of the abdomen and pelvis from 10/01/2019  FINDINGS: Gallbladder:  1.2 cm gallbladder calculus in the neck of the gallbladder. Mild gallbladder wall thickening at 4 mm without pericholecystic fluid or reported tenderness over the gallbladder.  Common bile duct:  Diameter: 2.7 mm  Liver:  LEFT lobe of the liver with limited visualization due to overlying bowel gas. No focal lesion on limited assessment with mildly increased echogenicity in hepatic parenchyma. Portal vein is patent on color Doppler imaging with normal direction of blood flow towards the liver.  Other: None.  IMPRESSION: Mild gallbladder wall thickening with moderately large gallstone in the neck of the gallbladder but without reported tenderness over the gallbladder. Findings are equivocal. If there is concern for cholecystitis HIDA scan may be helpful for further evaluation.   Electronically Signed By: Zetta Bills M.D. On: 04/21/2020 10:05    Problem List/Past Medical Duane Key K. Kylle Lall, MD; 05/06/2020 1:00 PM) INGUINAL HERNIA OF RIGHT SIDE WITHOUT OBSTRUCTION OR GANGRENE  (K40.90) CHRONIC CHOLECYSTITIS WITH CALCULUS (K80.10)  Past Surgical History (Duane Railsback K. Neithan Day, MD; 05/06/2020 1:00 PM) Knee Surgery Left. Oral Surgery Shoulder Surgery Right.  Diagnostic Studies History Duane Greene. Duane Rhyne, MD; 05/06/2020 1:00 PM) Colonoscopy 1-5 years ago  Allergies Duane Greene, RMA; 05/05/2020 11:26 AM) No Known Drug Allergies [05/28/2019]: Allergies Reconciled  Medication History Duane Greene. [name], MD; 05/06/2020 1:00 PM) Ondansetron HCl (4MG  Tablet, Oral) Active. Simvastatin (80MG  Tablet, Oral) Active. Triamcinolone Acetonide (0.1% Cream, External) Active. Tamsulosin HCl (0.4MG  Capsule, Oral) Active. traMADol HCl (50MG  Tablet, Oral) Active. Terbinafine HCl (250MG  Tablet, Oral) Active. Simvastatin (40MG  Tablet, Oral) Active. Multivitamin (Oral) Active. Aspirin (81MG  Tablet, Oral) Active. Lamisil (1% Cream, External) Active.  Social History Duane Greene. Duane Hepp, MD; 05/06/2020 1:00 PM) Caffeine use Carbonated beverages, Coffee. No alcohol use No drug use Tobacco use Never smoker.  Family History Duane Greene. Duane Talerico, MD; 05/06/2020 1:00 PM) Alcohol Abuse Father. Cerebrovascular Accident Father. Heart Disease Father.  Other Problems Duane Greene. [name], MD; 05/06/2020 1:00 PM) Kidney Duane Greene    Vitals Duane Greene RMA; 05/05/2020 11:30 AM) 05/05/2020 11:29 AM Weight: 205.38 lb Height: 70in Body Surface Area: 2.11 m Body Mass Index: 29.47 kg/m  Temp.: 97.75F  Pulse: 74 (Regular)  BP: 122/80(Sitting, Left Arm, Standard)        Physical Exam Duane Key K. Duane Fryberger MD; 05/06/2020 1:00 PM)  The physical exam findings are as follows: Note:Constitutional: WDWN in NAD, conversant, no obvious deformities; resting comfortably Eyes: Pupils equal, round; sclera anicteric; moist conjunctiva; no lid lag HENT: Oral mucosa moist; good dentition Neck: No masses palpated, trachea midline; no thyromegaly Lungs: CTA  bilaterally; normal respiratory effort CV: Regular rate  and rhythm; no murmurs; extremities well-perfused with no edema Abd: +bowel sounds, soft, non-tender, no palpable organomegaly; no palpable hernias Musc: Normal gait; no apparent clubbing or cyanosis in extremities Lymphatic: No palpable cervical or axillary lymphadenopathy Skin: Warm, dry; no sign of jaundice Psychiatric - alert and oriented x 4; calm mood and affect    Assessment & Plan Duane Hazard K. Rogelio Winbush MD; 05/05/2020 11:57 AM)  CHRONIC CHOLECYSTITIS WITH CALCULUS (K80.10)  Current Plans Schedule for Surgery - Laparoscopic cholecystectomy with intraoperative cholangiogram. The surgical procedure has been discussed with the patient. Potential risks, benefits, alternative treatments, and expected outcomes have been explained. All of the patient's questions at this time have been answered. The likelihood of reaching the patient's treatment goal is good. The patient understand the proposed surgical procedure and wishes to proceed.  Duane Arms. Corliss Skains, MD, [facility] Surgery  General/ Trauma Surgery   05/06/2020 1:01 PM

## 2020-06-08 NOTE — Progress Notes (Signed)
[facility] - [place], [place] - [address] [facility]. [address] [place] [address] Phone: [phone] Fax: [phone]      Your procedure is scheduled on Tuesday, July 27th.  Report to [facility] Main Entrance "A" at 11:30 A.M., and check in at the Admitting office.  Call this number if you have problems the morning of surgery:  [phone]  Call [phone] if you have any questions prior to your surgery date Monday-Friday 8am-4pm    Remember:  Do not eat after midnight the night before your surgery  You may drink clear liquids until 10:30 AM the morning of your surgery.   Clear liquids allowed are: Water, Non-Citrus Juices (without pulp), Carbonated Beverages, Clear Tea, Black Coffee Only, and Gatorade    Take these medicines the morning of surgery with A SIP OF WATER   NONE  Follow your surgeon's instructions on when to stop Aspirin.  If no instructions were given by your surgeon then you will need to call the office to get those instructions.    As of today, STOP taking Aleve, Naproxen, Ibuprofen, Motrin, Advil, Goody's, BC's, all herbal medications, fish oil, and all vitamins.                      Do not wear jewelry            Do not wear lotions, powders, colognes, or deodorant.            Men may shave face and neck.            Do not bring valuables to the hospital.            [facility] is not responsible for any belongings or valuables.  Do NOT Smoke (Tobacco/Vaping) or drink Alcohol 24 hours prior to your procedure If you use a CPAP at night, you may bring all equipment for your overnight stay.   Contacts, glasses, dentures or bridgework may not be worn into surgery.      For patients admitted to the hospital, discharge time will be determined by your treatment team.   Patients discharged the day of surgery will not be allowed to drive home, and someone needs to stay with them for 24 hours.    Special instructions:    [place]- Preparing For Surgery  Before surgery, you can play an important role. Because skin is not sterile, your skin needs to be as free of germs as possible. You can reduce the number of germs on your skin by washing with CHG (chlorahexidine gluconate) Soap before surgery.  CHG is an antiseptic cleaner which kills germs and bonds with the skin to continue killing germs even after washing.    Oral Hygiene is also important to reduce your risk of infection.  Remember - BRUSH YOUR TEETH THE MORNING OF SURGERY WITH YOUR REGULAR TOOTHPASTE  Please do not use if you have an allergy to CHG or antibacterial soaps. If your skin becomes reddened/irritated stop using the CHG.  Do not shave (including legs and underarms) for at least 48 hours prior to first CHG shower. It is OK to shave your face.  Please follow these instructions carefully.   1. Shower the NIGHT BEFORE SURGERY and the MORNING OF SURGERY with CHG Soap.   2. If you chose to wash your hair, wash your hair first as usual with your normal shampoo.  3. After you shampoo, rinse your hair and body thoroughly  to remove the shampoo.  4. Use CHG as you would any other liquid soap. You can apply CHG directly to the skin and wash gently with a scrungie or a clean washcloth.   5. Apply the CHG Soap to your body ONLY FROM THE NECK DOWN.  Do not use on open wounds or open sores. Avoid contact with your eyes, ears, mouth and genitals (private parts). Wash Face and genitals (private parts)  with your normal soap.   6. Wash thoroughly, paying special attention to the area where your surgery will be performed.  7. Thoroughly rinse your body with warm water from the neck down.  8. DO NOT shower/wash with your normal soap after using and rinsing off the CHG Soap.  9. Pat yourself dry with a CLEAN TOWEL.  10. Wear CLEAN PAJAMAS to bed the night before surgery  11. Place CLEAN SHEETS on your bed the night of your first shower and DO NOT SLEEP  WITH PETS.   Day of Surgery: Wear Clean/Comfortable clothing the morning of surgery Do not apply any deodorants/lotions.   Remember to brush your teeth WITH YOUR REGULAR TOOTHPASTE.   Please read over the following fact sheets that you were given.

## 2020-06-09 ENCOUNTER — Other Ambulatory Visit (HOSPITAL_COMMUNITY)
Admission: RE | Admit: 2020-06-09 | Discharge: 2020-06-09 | Disposition: A | Discharge status: Home / Self Care | Payer: PPO | Source: Ambulatory Visit | Attending: Surgery | Admitting: Surgery

## 2020-06-09 ENCOUNTER — Encounter (HOSPITAL_COMMUNITY): Payer: Self-pay

## 2020-06-09 ENCOUNTER — Encounter (HOSPITAL_COMMUNITY)
Admission: RE | Admit: 2020-06-09 | Discharge: 2020-06-09 | Disposition: A | Discharge status: Home / Self Care | Payer: PPO | Source: Ambulatory Visit | Attending: Surgery | Admitting: Surgery

## 2020-06-09 ENCOUNTER — Other Ambulatory Visit: Payer: Self-pay

## 2020-06-09 DIAGNOSIS — Z20822 Contact with and (suspected) exposure to covid-19: Secondary | ICD-10-CM | POA: Diagnosis not present

## 2020-06-09 DIAGNOSIS — Z01812 Encounter for preprocedural laboratory examination: Secondary | ICD-10-CM | POA: Diagnosis not present

## 2020-06-09 HISTORY — DX: Personal history of urinary calculi: Z87.442

## 2020-06-09 LAB — CBC
HCT: 47.1 % (ref 39.0–52.0)
Hemoglobin: 15.8 g/dL (ref 13.0–17.0)
MCH: 32.2 pg (ref 26.0–34.0)
MCHC: 33.5 g/dL (ref 30.0–36.0)
MCV: 95.9 fL (ref 80.0–100.0)
Platelets: 200 10*3/uL (ref 150–400)
RBC: 4.91 MIL/uL (ref 4.22–5.81)
RDW: 12.7 % (ref 11.5–15.5)
WBC: 5.6 10*3/uL (ref 4.0–10.5)
nRBC: 0 % (ref 0.0–0.2)

## 2020-06-09 LAB — SARS CORONAVIRUS 2 (TAT 6-24 HRS): SARS Coronavirus 2: NEGATIVE

## 2020-06-09 NOTE — Progress Notes (Signed)
PCP - Dr. Marden Noble Cardiologist - denies  PPM/ICD - denies  Chest x-ray - N/A EKG - 04/24/2020 Stress Test - denies ECHO - denies Cardiac Cath - denies   Sleep Study - Yes, per patient, wears CPAP every night.  DM: denies  Blood Thinner Instructions: N/A Aspirin Instructions: N/A  ERAS Protcol - Yes PRE-SURGERY Ensure or G2- Ensure given  COVID TEST- 06/09/2020, results pending  Anesthesia review: No  Patient denies shortness of breath, fever, cough and chest pain at PAT appointment  All instructions explained to the patient, with a verbal understanding of the material. Patient agrees to go over the instructions while at home for a better understanding. Patient also instructed to self quarantine after being tested for COVID-19. The opportunity to ask questions was provided.

## 2020-06-12 MED FILL — SIMVASTATIN 80 MG TABLET: 80 | 90 days supply | Qty: 90 | Fill #2

## 2020-06-12 MED FILL — TERBINAFINE HCL 250 MG TAB: 250 | 84 days supply | Qty: 12 | Fill #3

## 2020-06-13 ENCOUNTER — Encounter (HOSPITAL_COMMUNITY)
Admission: RE | Disposition: A | Discharge status: Home / Self Care | Payer: Self-pay | Source: Ambulatory Visit | Attending: Surgery

## 2020-06-13 ENCOUNTER — Other Ambulatory Visit: Payer: Self-pay

## 2020-06-13 ENCOUNTER — Ambulatory Visit (HOSPITAL_COMMUNITY)
Admission: RE | Admit: 2020-06-13 | Discharge: 2020-06-13 | Disposition: A | Discharge status: Home / Self Care | Payer: PPO | Source: Ambulatory Visit | Attending: Surgery | Admitting: Surgery

## 2020-06-13 ENCOUNTER — Ambulatory Visit (HOSPITAL_COMMUNITY): Payer: PPO | Admitting: Anesthesiology

## 2020-06-13 ENCOUNTER — Encounter (HOSPITAL_COMMUNITY): Payer: Self-pay | Admitting: Surgery

## 2020-06-13 ENCOUNTER — Ambulatory Visit (HOSPITAL_COMMUNITY): Payer: PPO

## 2020-06-13 DIAGNOSIS — K8010 Calculus of gallbladder with chronic cholecystitis without obstruction: Secondary | ICD-10-CM | POA: Insufficient documentation

## 2020-06-13 DIAGNOSIS — K8063 Calculus of gallbladder and bile duct with acute cholecystitis with obstruction: Secondary | ICD-10-CM

## 2020-06-13 DIAGNOSIS — E785 Hyperlipidemia, unspecified: Secondary | ICD-10-CM | POA: Diagnosis not present

## 2020-06-13 DIAGNOSIS — K915 Postcholecystectomy syndrome: Secondary | ICD-10-CM | POA: Diagnosis not present

## 2020-06-13 DIAGNOSIS — G4730 Sleep apnea, unspecified: Secondary | ICD-10-CM | POA: Insufficient documentation

## 2020-06-13 DIAGNOSIS — Z9989 Dependence on other enabling machines and devices: Secondary | ICD-10-CM | POA: Diagnosis not present

## 2020-06-13 DIAGNOSIS — K8018 Calculus of gallbladder with other cholecystitis without obstruction: Secondary | ICD-10-CM | POA: Diagnosis not present

## 2020-06-13 HISTORY — PX: INTRAOPERATIVE CHOLANGIOGRAM: SHX5230

## 2020-06-13 HISTORY — PX: CHOLECYSTECTOMY: SHX55

## 2020-06-13 SURGERY — LAPAROSCOPIC CHOLECYSTECTOMY WITH INTRAOPERATIVE CHOLANGIOGRAM
Anesthesia: General | Site: Abdomen

## 2020-06-13 MED ORDER — KETOROLAC TROMETHAMINE 30 MG/ML IJ SOLN: 30.0000 mg | Freq: Once | INTRAMUSCULAR | Status: AC | PRN

## 2020-06-13 MED ORDER — SODIUM CHLORIDE 0.9 % IR SOLN
Status: DC | PRN
  Administered 2020-06-13: 1000 mL

## 2020-06-13 MED ORDER — ACETAMINOPHEN 500 MG PO TABS: 1000.0000 mg | ORAL_TABLET | ORAL | Status: AC

## 2020-06-13 MED ORDER — LACTATED RINGERS IV SOLN: INTRAVENOUS | Status: DC

## 2020-06-13 MED ORDER — DEXAMETHASONE SODIUM PHOSPHATE 10 MG/ML IJ SOLN
INTRAMUSCULAR | Status: DC | PRN
  Administered 2020-06-13: 10 mg via INTRAVENOUS

## 2020-06-13 MED ORDER — GABAPENTIN 300 MG PO CAPS
ORAL_CAPSULE | ORAL | Status: AC
  Administered 2020-06-13: 300 mg via ORAL
  Filled 2020-06-13: qty 1

## 2020-06-13 MED ORDER — CHLORHEXIDINE GLUCONATE 0.12 % MT SOLN
OROMUCOSAL | Status: AC
  Administered 2020-06-13: 15 mL via OROMUCOSAL
  Filled 2020-06-13: qty 15

## 2020-06-13 MED ORDER — ACETAMINOPHEN 500 MG PO TABS
ORAL_TABLET | ORAL | Status: AC
  Administered 2020-06-13: 1000 mg via ORAL
  Filled 2020-06-13: qty 2

## 2020-06-13 MED ORDER — MIDAZOLAM HCL 2 MG/2ML IJ SOLN
INTRAMUSCULAR | Status: AC
  Filled 2020-06-13: qty 2

## 2020-06-13 MED ORDER — HEMOSTATIC AGENTS (NO CHARGE) OPTIME
TOPICAL | Status: DC | PRN
  Administered 2020-06-13: 1 via TOPICAL

## 2020-06-13 MED ORDER — FENTANYL CITRATE (PF) 250 MCG/5ML IJ SOLN
INTRAMUSCULAR | Status: AC
  Filled 2020-06-13: qty 5

## 2020-06-13 MED ORDER — CHLORHEXIDINE GLUCONATE CLOTH 2 % EX PADS: 6.0000 | MEDICATED_PAD | Freq: Once | CUTANEOUS | Status: DC

## 2020-06-13 MED ORDER — FENTANYL CITRATE (PF) 100 MCG/2ML IJ SOLN
INTRAMUSCULAR | Status: DC | PRN
  Administered 2020-06-13 (×4): 50 ug via INTRAVENOUS

## 2020-06-13 MED ORDER — BUPIVACAINE HCL (PF) 0.25 % IJ SOLN
INTRAMUSCULAR | Status: DC | PRN
  Administered 2020-06-13: 10 mL

## 2020-06-13 MED ORDER — EPHEDRINE SULFATE 50 MG/ML IJ SOLN
INTRAMUSCULAR | Status: DC | PRN
Start: 2020-06-13 — End: 2020-06-13
  Administered 2020-06-13: 10 mg via INTRAVENOUS

## 2020-06-13 MED ORDER — HYDROCODONE-ACETAMINOPHEN 5-325 MG PO TABS
1.0000 | ORAL_TABLET | Freq: Four times a day (QID) | ORAL | 0 refills | Status: DC | PRN
Start: 2020-06-13 — End: 2020-08-29

## 2020-06-13 MED ORDER — OXYCODONE HCL 5 MG PO TABS
ORAL_TABLET | ORAL | Status: AC
  Filled 2020-06-13: qty 1

## 2020-06-13 MED ORDER — GABAPENTIN 300 MG PO CAPS: 300.0000 mg | ORAL_CAPSULE | ORAL | Status: AC

## 2020-06-13 MED ORDER — KETOROLAC TROMETHAMINE 30 MG/ML IJ SOLN
INTRAMUSCULAR | Status: DC | PRN
  Administered 2020-06-13: 30 mg via INTRAVENOUS

## 2020-06-13 MED ORDER — LACTATED RINGERS IV SOLN: INTRAVENOUS | Status: DC | PRN

## 2020-06-13 MED ORDER — BUPIVACAINE HCL (PF) 0.25 % IJ SOLN
INTRAMUSCULAR | Status: AC
  Filled 2020-06-13: qty 30

## 2020-06-13 MED ORDER — LIDOCAINE 2% (20 MG/ML) 5 ML SYRINGE
INTRAMUSCULAR | Status: DC | PRN
  Administered 2020-06-13: 100 mg via INTRAVENOUS

## 2020-06-13 MED ORDER — SODIUM CHLORIDE 0.9 % IV SOLN
INTRAVENOUS | Status: DC | PRN
  Administered 2020-06-13: 10 mL

## 2020-06-13 MED ORDER — PROPOFOL 10 MG/ML IV BOLUS
INTRAVENOUS | Status: AC
  Filled 2020-06-13: qty 20

## 2020-06-13 MED ORDER — MIDAZOLAM HCL 5 MG/5ML IJ SOLN
INTRAMUSCULAR | Status: DC | PRN
  Administered 2020-06-13: 1 mg via INTRAVENOUS

## 2020-06-13 MED ORDER — ORAL CARE MOUTH RINSE: 15.0000 mL | Freq: Once | OROMUCOSAL | Status: AC

## 2020-06-13 MED ORDER — CEFAZOLIN SODIUM-DEXTROSE 2-4 GM/100ML-% IV SOLN
INTRAVENOUS | Status: AC
  Filled 2020-06-13: qty 100

## 2020-06-13 MED ORDER — FENTANYL CITRATE (PF) 100 MCG/2ML IJ SOLN: 25.0000 ug | INTRAMUSCULAR | Status: DC | PRN

## 2020-06-13 MED ORDER — CHLORHEXIDINE GLUCONATE 0.12 % MT SOLN: 15.0000 mL | Freq: Once | OROMUCOSAL | Status: AC

## 2020-06-13 MED ORDER — 0.9 % SODIUM CHLORIDE (POUR BTL) OPTIME
TOPICAL | Status: DC | PRN
  Administered 2020-06-13: 1000 mL

## 2020-06-13 MED ORDER — OXYCODONE HCL 5 MG PO TABS
5.0000 mg | ORAL_TABLET | Freq: Once | ORAL | Status: AC | PRN
  Administered 2020-06-13: 5 mg via ORAL

## 2020-06-13 MED ORDER — PROPOFOL 10 MG/ML IV BOLUS
INTRAVENOUS | Status: DC | PRN
  Administered 2020-06-13: 50 mg via INTRAVENOUS
  Administered 2020-06-13: 150 mg via INTRAVENOUS

## 2020-06-13 MED ORDER — SUGAMMADEX SODIUM 200 MG/2ML IV SOLN
INTRAVENOUS | Status: DC | PRN
Start: 2020-06-13 — End: 2020-06-13
  Administered 2020-06-13: 187 mg via INTRAVENOUS

## 2020-06-13 MED ORDER — PHENYLEPHRINE HCL (PRESSORS) 10 MG/ML IV SOLN
INTRAVENOUS | Status: DC | PRN
Start: 2020-06-13 — End: 2020-06-13
  Administered 2020-06-13: 80 ug via INTRAVENOUS

## 2020-06-13 MED ORDER — ROCURONIUM BROMIDE 10 MG/ML (PF) SYRINGE
PREFILLED_SYRINGE | INTRAVENOUS | Status: DC | PRN
  Administered 2020-06-13: 60 mg via INTRAVENOUS

## 2020-06-13 MED ORDER — OXYCODONE HCL 5 MG/5ML PO SOLN: 5.0000 mg | Freq: Once | ORAL | Status: AC | PRN

## 2020-06-13 MED ORDER — CEFAZOLIN SODIUM-DEXTROSE 2-4 GM/100ML-% IV SOLN
2.0000 g | INTRAVENOUS | Status: AC
  Administered 2020-06-13: 2 g via INTRAVENOUS

## 2020-06-13 MED FILL — HYDROCODON-APAP 5-325: 5-325 | 5 days supply | Qty: 20 | Fill #0

## 2020-06-13 SURGICAL SUPPLY — 51 items
APL PRP STRL LF DISP 70% ISPRP (MISCELLANEOUS) ×1
APL SKNCLS STERI-STRIP NONHPOA (GAUZE/BANDAGES/DRESSINGS) ×1
APPLIER CLIP ROT 10 11.4 M/L (STAPLE) ×2
APR CLP MED LRG 11.4X10 (STAPLE) ×1
BAG SPEC RTRVL 10 TROC 200 (ENDOMECHANICALS)
BAG SPEC RTRVL LRG 6X4 10 (ENDOMECHANICALS) ×1
BENZOIN TINCTURE PRP APPL 2/3 (GAUZE/BANDAGES/DRESSINGS) ×2 IMPLANT
BLADE CLIPPER SURG (BLADE) ×1 IMPLANT
CANISTER SUCT 3000ML PPV (MISCELLANEOUS) ×2 IMPLANT
CHLORAPREP W/TINT 26 (MISCELLANEOUS) ×2 IMPLANT
CLIP APPLIE ROT 10 11.4 M/L (STAPLE) ×1 IMPLANT
COVER MAYO STAND STRL (DRAPES) ×2 IMPLANT
COVER SURGICAL LIGHT HANDLE (MISCELLANEOUS) ×2 IMPLANT
COVER WAND RF STERILE (DRAPES) ×2 IMPLANT
DRAPE C-ARM 42X120 X-RAY (DRAPES) ×2 IMPLANT
DRSG TEGADERM 2-3/8X2-3/4 SM (GAUZE/BANDAGES/DRESSINGS) ×6 IMPLANT
DRSG TEGADERM 4X4.75 (GAUZE/BANDAGES/DRESSINGS) ×2 IMPLANT
ELECT REM PT RETURN 9FT ADLT (ELECTROSURGICAL) ×2
ELECTRODE REM PT RTRN 9FT ADLT (ELECTROSURGICAL) ×1 IMPLANT
GAUZE SPONGE 2X2 8PLY STRL LF (GAUZE/BANDAGES/DRESSINGS) ×1 IMPLANT
GLOVE BIO SURGEON STRL SZ7 (GLOVE) ×2 IMPLANT
GLOVE BIOGEL PI IND STRL 7.5 (GLOVE) ×1 IMPLANT
GLOVE BIOGEL PI INDICATOR 7.5 (GLOVE) ×1
GOWN STRL REUS W/ TWL LRG LVL3 (GOWN DISPOSABLE) ×3 IMPLANT
GOWN STRL REUS W/TWL LRG LVL3 (GOWN DISPOSABLE) ×6
KIT BASIN OR (CUSTOM PROCEDURE TRAY) ×2 IMPLANT
KIT TURNOVER KIT B (KITS) ×2 IMPLANT
L-HOOK LAP DISP 36CM (ELECTROSURGICAL) ×2
LHOOK LAP DISP 36CM (ELECTROSURGICAL) IMPLANT
NS IRRIG 1000ML POUR BTL (IV SOLUTION) ×2 IMPLANT
PAD ARMBOARD 7.5X6 YLW CONV (MISCELLANEOUS) ×2 IMPLANT
PENCIL BUTTON HOLSTER BLD 10FT (ELECTRODE) ×1 IMPLANT
POUCH RETRIEVAL ECOSAC 10 (ENDOMECHANICALS) IMPLANT
POUCH RETRIEVAL ECOSAC 10MM (ENDOMECHANICALS)
POUCH SPECIMEN RETRIEVAL 10MM (ENDOMECHANICALS) ×1 IMPLANT
SCISSORS LAP 5X35 DISP (ENDOMECHANICALS) ×2 IMPLANT
SET CHOLANGIOGRAPH 5 50 .035 (SET/KITS/TRAYS/PACK) ×2 IMPLANT
SET IRRIG TUBING LAPAROSCOPIC (IRRIGATION / IRRIGATOR) ×2 IMPLANT
SET TUBE SMOKE EVAC HIGH FLOW (TUBING) ×2 IMPLANT
SLEEVE ENDOPATH XCEL 5M (ENDOMECHANICALS) ×2 IMPLANT
SPECIMEN JAR SMALL (MISCELLANEOUS) ×2 IMPLANT
SPONGE GAUZE 2X2 STER 10/PKG (GAUZE/BANDAGES/DRESSINGS) ×1
STRIP CLOSURE SKIN 1/2X4 (GAUZE/BANDAGES/DRESSINGS) ×2 IMPLANT
SUT MNCRL AB 4-0 PS2 18 (SUTURE) ×2 IMPLANT
TOWEL GREEN STERILE (TOWEL DISPOSABLE) ×2 IMPLANT
TOWEL GREEN STERILE FF (TOWEL DISPOSABLE) ×2 IMPLANT
TRAY LAPAROSCOPIC MC (CUSTOM PROCEDURE TRAY) ×2 IMPLANT
TROCAR XCEL BLUNT TIP 100MML (ENDOMECHANICALS) ×2 IMPLANT
TROCAR XCEL NON-BLD 11X100MML (ENDOMECHANICALS) ×2 IMPLANT
TROCAR XCEL NON-BLD 5MMX100MML (ENDOMECHANICALS) ×2 IMPLANT
WATER STERILE IRR 1000ML POUR (IV SOLUTION) ×2 IMPLANT

## 2020-06-13 NOTE — Transfer of Care (Signed)
Immediate Anesthesia Transfer of Care Note  Patient: Duane Greene  Procedure(s) Performed: LAPAROSCOPIC CHOLECYSTECTOMY (N/A Abdomen) INTRAOPERATIVE CHOLANGIOGRAM (N/A Abdomen)  Patient Location: PACU  Anesthesia Type:General  Level of Consciousness: sedated  Airway & Oxygen Therapy: Patient Spontanous Breathing and Patient connected to nasal cannula oxygen  Post-op Assessment: Report given to RN, Post -op Vital signs reviewed and stable and Patient moving all extremities X 4  Post vital signs: Reviewed and stable  Last Vitals:  Vitals Value Taken Time  BP 157/78 06/13/20 0909  Temp    Pulse 58 06/13/20 0909  Resp 19 06/13/20 0909  SpO2 96 % 06/13/20 0909  Vitals shown include unvalidated device data.  Last Pain:  Vitals:   06/13/20 0628  TempSrc: Oral  PainSc:          Complications: No complications documented.

## 2020-06-13 NOTE — Anesthesia Procedure Notes (Signed)
Procedure Name: Intubation Date/Time: 06/13/2020 7:35 AM Performed by: Neldon Newport, CRNA Pre-anesthesia Checklist: Timeout performed, Patient being monitored, Suction available, Emergency Drugs available and Patient identified Patient Re-evaluated:Patient Re-evaluated prior to induction Oxygen Delivery Method: Circle system utilized Preoxygenation: Pre-oxygenation with 100% oxygen Induction Type: IV induction Ventilation: Mask ventilation without difficulty Laryngoscope Size: Mac, 4, Glidescope and 3 Grade View: Grade III Tube type: Oral Tube size: 7.5 mm Number of attempts: 1 Placement Confirmation: ETT inserted through vocal cords under direct vision,  positive ETCO2 and breath sounds checked- equal and bilateral Secured at: 24 cm Tube secured with: Tape Dental Injury: Teeth and Oropharynx as per pre-operative assessment

## 2020-06-13 NOTE — Anesthesia Postprocedure Evaluation (Signed)
Anesthesia Post Note  Patient: Duane Greene  Procedure(s) Performed: LAPAROSCOPIC CHOLECYSTECTOMY (N/A Abdomen) INTRAOPERATIVE CHOLANGIOGRAM (N/A Abdomen)     Patient location during evaluation: PACU Anesthesia Type: General Level of consciousness: awake and alert Pain management: pain level controlled Vital Signs Assessment: post-procedure vital signs reviewed and stable Respiratory status: spontaneous breathing, nonlabored ventilation, respiratory function stable and patient connected to nasal cannula oxygen Cardiovascular status: blood pressure returned to baseline and stable Postop Assessment: no apparent nausea or vomiting Anesthetic complications: no   No complications documented.  Last Vitals:  Vitals:   06/13/20 0924 06/13/20 0939  BP: (!) 151/66 (!) 132/72  Pulse: 57 58  Resp: 16 16  Temp:    SpO2: 97% 96%    Last Pain:  Vitals:   06/13/20 0939  TempSrc:   PainSc: 0-No pain                 Vonzell Lindblad S

## 2020-06-13 NOTE — Op Note (Signed)
Laparoscopic Cholecystectomy with IOC Procedure Note  Indications: This is a 69 year old male s/p RIH repair with mesh 8/20 who presents after recent ED visit. The patient had documented gallstones seen on a CT scan for kidney stones, but previously had no symptoms. He presented to the ED last week with acute onset of epigastric/ RUQ pain radiating into his back. He did have some nausea and bloating. He underwent work-up in the ED that showed normal LFT's and a 1.2 cm gallstone in the neck of the gallbladder. He has been asymptomatic since that time. He presents now for cholecystectomy.  Pre-operative Diagnosis: Calculus of gallbladder with other cholecystitis, without mention of obstruction  Post-operative Diagnosis: Same  Surgeon: Wynona Luna   Assistants: Patrici Ranks, RNFA  Anesthesia: General endotracheal anesthesia  ASA Class: 2  Procedure Details  The patient was seen again in the Holding Room. The risks, benefits, complications, treatment options, and expected outcomes were discussed with the patient. The possibilities of reaction to medication, pulmonary aspiration, perforation of viscus, bleeding, recurrent infection, finding a normal gallbladder, the need for additional procedures, failure to diagnose a condition, the possible need to convert to an open procedure, and creating a complication requiring transfusion or operation were discussed with the patient. The likelihood of improving the patient's symptoms with return to their baseline status is good.  The patient and/or family concurred with the proposed plan, giving informed consent. The site of surgery properly noted. The patient was taken to Operating Room, identified as Duane Greene and the procedure verified as Laparoscopic Cholecystectomy with Intraoperative Cholangiogram. A Time Out was held and the above information confirmed.  Prior to the induction of general anesthesia, antibiotic prophylaxis was  administered. General endotracheal anesthesia was then administered and tolerated well. After the induction, the abdomen was prepped with Chloraprep and draped in the sterile fashion. The patient was positioned in the supine position.  Local anesthetic agent was injected into the skin above the umbilicus and an incision made. We dissected down to the abdominal fascia with blunt dissection.  The fascia was incised vertically and we entered the peritoneal cavity bluntly.  A pursestring suture of 0-Vicryl was placed around the fascial opening.  The Hasson cannula was inserted and secured with the stay suture.  Pneumoperitoneum was then created with CO2 and tolerated well without any adverse changes in the patient's vital signs. An 11-mm port was placed in the subxiphoid position.  Two 5-mm ports were placed in the right upper quadrant. All skin incisions were infiltrated with a local anesthetic agent before making the incision and placing the trocars.   We positioned the patient in reverse Trendelenburg, tilted slightly to the patient's left.  The gallbladder was identified, the fundus grasped and retracted cephalad. There are significant omental adhesions to the gallbladder.  Adhesions were lysed bluntly and with the electrocautery where indicated, taking care not to injure any adjacent organs or viscus. The infundibulum was grasped and retracted laterally, exposing the peritoneum overlying the triangle of Calot. This was then divided and exposed in a blunt fashion. A critical view of the cystic duct and cystic artery was obtained.  The cystic duct was clearly identified and bluntly dissected circumferentially. The cystic duct was ligated with a clip distally.   An incision was made in the cystic duct and the [facility] cholangiogram catheter introduced. The catheter was secured using a clip. A cholangiogram was then obtained which showed good visualization of the distal and proximal biliary tree with  no sign of filling  defects or obstruction.  Contrast flowed easily into the duodenum. The catheter was then removed.   The cystic duct was then ligated with clips and divided. The cystic artery was identified, dissected free, ligated with clips and divided as well.   The gallbladder was dissected from the liver bed in retrograde fashion with the electrocautery. The gallbladder was removed and placed in an Endocatch sac. The liver bed was irrigated and inspected. Hemostasis was achieved with the electrocautery and SNOW. Copious irrigation was utilized and was repeatedly aspirated until clear.  The gallbladder and Endocatch sac were then removed through the umbilical port site.  The pursestring suture was used to close the umbilical fascia.    We again inspected the right upper quadrant for hemostasis.  Pneumoperitoneum was released as we removed the trocars.  4-0 Monocryl was used to close the skin.   Benzoin, steri-strips, and clean dressings were applied. The patient was then extubated and brought to the recovery room in stable condition. Instrument, sponge, and needle counts were correct at closure and at the conclusion of the case.   Findings: Cholecystitis with Cholelithiasis  Estimated Blood Loss: less than 50 mL         Drains: none         Specimens: Gallbladder           Complications: None; patient tolerated the procedure well.         Disposition: PACU - hemodynamically stable.         Condition: stable  Duane Greene. Corliss Skains, MD, [facility]  General/ Trauma Surgery   06/13/2020 8:50 AM

## 2020-06-13 NOTE — Discharge Instructions (Signed)
[facility] ______[facility] °LAPAROSCOPIC SURGERY: POST OP INSTRUCTIONS °Always review your discharge instruction sheet given to you by the facility where your surgery was performed. °IF YOU HAVE DISABILITY OR FAMILY LEAVE FORMS, YOU MUST BRING THEM TO THE OFFICE FOR PROCESSING.   °DO NOT GIVE THEM TO YOUR DOCTOR. ° °1. A prescription for pain medication may be given to you upon discharge.  Take your pain medication as prescribed, if needed.  If narcotic pain medicine is not needed, then you may take acetaminophen (Tylenol) or ibuprofen (Advil) as needed. °2. Take your usually prescribed medications unless otherwise directed. °3. If you need a refill on your pain medication, please contact your pharmacy.  They will contact our office to request authorization. Prescriptions will not be filled after 5pm or on week-ends. °4. You should follow a light diet the first few days after arrival home, such as soup and crackers, etc.  Be sure to include lots of fluids daily. °5. Most patients will experience some swelling and bruising in the area of the incisions.  Ice packs will help.  Swelling and bruising can take several days to resolve.  °6. It is common to experience some constipation if taking pain medication after surgery.  Increasing fluid intake and taking a stool softener (such as Colace) will usually help or prevent this problem from occurring.  A mild laxative (Milk of Magnesia or Miralax) should be taken according to package instructions if there are no bowel movements after 48 hours. °7. Unless discharge instructions indicate otherwise, you may remove your bandages 24-48 hours after surgery, and you may shower at that time.  You may have steri-strips (small skin tapes) in place directly over the incision.  These strips should be left on the skin for 7-10 days.  If your surgeon used skin glue on the incision, you may shower in 24 hours.  The glue will flake off over the next 2-3 weeks.  Any sutures or  staples will be removed at the office during your follow-up visit. °8. ACTIVITIES:  You may resume regular (light) daily activities beginning the next day--such as daily self-care, walking, climbing stairs--gradually increasing activities as tolerated.  You may have sexual intercourse when it is comfortable.  Refrain from any heavy lifting or straining until approved by your doctor. °a. You may drive when you are no longer taking prescription pain medication, you can comfortably wear a seatbelt, and you can safely maneuver your car and apply brakes. °b. RETURN TO WORK:  __________________________________________________________ °9. You should see your doctor in the office for a follow-up appointment approximately 2-3 weeks after your surgery.  Make sure that you call for this appointment within a day or two after you arrive home to insure a convenient appointment time. °10. OTHER INSTRUCTIONS: __________________________________________________________________________________________________________________________ __________________________________________________________________________________________________________________________ °WHEN TO CALL YOUR DOCTOR: °1. Fever over 101.0 °2. Inability to urinate °3. Continued bleeding from incision. °4. Increased pain, redness, or drainage from the incision. °5. Increasing abdominal pain ° °The clinic staff is available to answer your questions during regular business hours.  Please don’t hesitate to call and ask to speak to one of the nurses for clinical concerns.  If you have a medical emergency, go to the nearest emergency room or call 911.  A surgeon from [facility] is always on call at the hospital. °[address], [address], [place], [place]  [address] ? [address] [address], [place],    [address] °[phone] ? [phone] ? FAX [phone] °Web site:   [email or web address] °

## 2020-06-13 NOTE — Anesthesia Preprocedure Evaluation (Signed)
Anesthesia Evaluation  Patient identified by MRN, date of birth, ID band Patient awake    Reviewed: Allergy & Precautions, NPO status , Patient's Chart, lab work & pertinent test results  Airway Mallampati: II  TM Distance: <3 FB Neck ROM: Full    Dental no notable dental hx.    Pulmonary sleep apnea and Continuous Positive Airway Pressure Ventilation ,    Pulmonary exam normal breath sounds clear to auscultation       Cardiovascular negative cardio ROS Normal cardiovascular exam Rhythm:Regular Rate:Normal     Neuro/Psych negative neurological ROS  negative psych ROS   GI/Hepatic negative GI ROS, Neg liver ROS,   Endo/Other  negative endocrine ROS  Renal/GU negative Renal ROS  negative genitourinary   Musculoskeletal negative musculoskeletal ROS (+)   Abdominal   Peds negative pediatric ROS (+)  Hematology negative hematology ROS (+)   Anesthesia Other Findings   Reproductive/Obstetrics negative OB ROS                             Anesthesia Physical Anesthesia Plan  ASA: II  Anesthesia Plan: General   Post-op Pain Management:    Induction: Intravenous  PONV Risk Score and Plan: 2 and Ondansetron, Dexamethasone and Treatment may vary due to age or medical condition  Airway Management Planned: Oral ETT  Additional Equipment:   Intra-op Plan:   Post-operative Plan: Extubation in OR  Informed Consent: I have reviewed the patients History and Physical, chart, labs and discussed the procedure including the risks, benefits and alternatives for the proposed anesthesia with the patient or authorized representative who has indicated his/her understanding and acceptance.     Dental advisory given  Plan Discussed with: CRNA and Surgeon  Anesthesia Plan Comments:         Anesthesia Quick Evaluation  

## 2020-06-13 NOTE — H&P (Signed)
History of Present Illness The patient is a 69 year old male who presents for evaluation of gall stones. PCP - Kevan Ny  This is a 69 year old male s/p RIH repair with mesh 8/20 who presents after recent ED visit. The patient had documented gallstones seen on a CT scan for kidney stones, but previously had no symptoms. He presented to the ED last week with acute onset of epigastric/ RUQ pain radiating into his back. He did have some nausea and bloating. He underwent work-up in the ED that showed normal LFT's and a 1.2 cm gallstone in the neck of the gallbladder. He has been asymptomatic since that time. He presents now for discussion of cholecystectomy.  CLINICAL DATA: Epigastric abdominal pain since midnight  EXAM: ULTRASOUND ABDOMEN LIMITED RIGHT UPPER QUADRANT  COMPARISON: CT of the abdomen and pelvis from 10/01/2019  FINDINGS: Gallbladder:  1.2 cm gallbladder calculus in the neck of the gallbladder. Mild gallbladder wall thickening at 4 mm without pericholecystic fluid or reported tenderness over the gallbladder.  Common bile duct:  Diameter: 2.7 mm  Liver:  LEFT lobe of the liver with limited visualization due to overlying bowel gas. No focal lesion on limited assessment with mildly increased echogenicity in hepatic parenchyma. Portal vein is patent on color Doppler imaging with normal direction of blood flow towards the liver.  Other: None.  IMPRESSION: Mild gallbladder wall thickening with moderately large gallstone in the neck of the gallbladder but without reported tenderness over the gallbladder. Findings are equivocal. If there is concern for cholecystitis HIDA scan may be helpful for further evaluation.   Electronically Signed By: Donzetta Kohut M.D. 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 Oral Surgery Shoulder Surgery Right.  Diagnostic Studies History Colonoscopy 1-5 years ago  Allergies  No Known Drug Allergies Allergies Reconciled  Medication History Ondansetron HCl (4MG  Tablet, Oral) Active. Simvastatin (80MG  Tablet, Oral) Active. Triamcinolone Acetonide (0.1% Cream, External) Active. Tamsulosin HCl (0.4MG  Capsule, Oral) Active. traMADol HCl (50MG  Tablet, Oral) Active. Terbinafine HCl (250MG  Tablet, Oral) Active. Simvastatin (40MG  Tablet, Oral) Active. Multivitamin (Oral) Active. Aspirin (81MG  Tablet, Oral) Active. Lamisil (1% Cream, External) Active.  Social History  Caffeine use Carbonated beverages, Coffee. No alcohol use No drug use Tobacco use Never smoker.  Family History  Alcohol Abuse Father. Cerebrovascular Accident Father. Heart Disease Father.  Other Problems Kidney Stone Sleep Apnea    Vitals  Weight: 205.38 lb Height: 70in Body Surface Area: 2.11 m Body Mass Index: 29.47 kg/m  Temp.: 97.23F  Pulse: 74 (Regular)  BP: 122/80(Sitting, Left Arm, Standard)        Physical Exam   The physical exam findings are as follows: Note:Constitutional: WDWN in NAD, conversant, no obvious deformities; resting comfortably Eyes: Pupils equal, round; sclera anicteric; moist conjunctiva; no lid lag HENT: Oral mucosa moist; good dentition Neck: No masses palpated, trachea midline; no thyromegaly Lungs: CTA bilaterally; normal respiratory effort CV: Regular rate and rhythm; no murmurs; extremities well-perfused with no edema Abd: +bowel sounds, soft, non-tender, no palpable organomegaly; no palpable hernias Musc: Normal gait; no apparent clubbing or cyanosis in extremities Lymphatic: No palpable cervical or axillary lymphadenopathy Skin: Warm, dry; no sign of jaundice Psychiatric - alert and oriented x 4; calm mood and affect    Assessment & Plan    CHRONIC CHOLECYSTITIS WITH CALCULUS (K80.10)  Current Plans Schedule for  Surgery - Laparoscopic cholecystectomy with intraoperative cholangiogram. The surgical procedure has been discussed with the patient. Potential risks, benefits, alternative treatments, and expected outcomes have been explained. All of the patient's questions at this time have been answered. The likelihood of reaching the patient's treatment goal is good. The patient understand the proposed surgical procedure and wishes to proceed.  Wilmon Arms. Corliss Skains, MD, [facility]  General/ Trauma Surgery   06/13/2020 7:18 AM

## 2020-06-14 ENCOUNTER — Encounter (HOSPITAL_COMMUNITY): Payer: Self-pay | Admitting: Surgery

## 2020-06-14 LAB — SURGICAL PATHOLOGY

## 2020-06-15 DIAGNOSIS — M5416 Radiculopathy, lumbar region: Secondary | ICD-10-CM | POA: Diagnosis not present

## 2020-06-15 DIAGNOSIS — M6281 Muscle weakness (generalized): Secondary | ICD-10-CM | POA: Diagnosis not present

## 2020-06-20 ENCOUNTER — Other Ambulatory Visit: Payer: Self-pay | Admitting: Internal Medicine

## 2020-06-20 DIAGNOSIS — M5416 Radiculopathy, lumbar region: Secondary | ICD-10-CM

## 2020-06-30 MED FILL — ALPRAZolam 0.25 MG TABS: 0.25 | 1 days supply | Qty: 2 | Fill #0

## 2020-07-15 ENCOUNTER — Other Ambulatory Visit: Payer: Self-pay

## 2020-07-15 ENCOUNTER — Ambulatory Visit
Admission: RE | Admit: 2020-07-15 | Discharge: 2020-07-15 | Disposition: A | Discharge status: Home / Self Care | Payer: PPO | Source: Ambulatory Visit | Attending: Internal Medicine | Admitting: Internal Medicine

## 2020-07-15 DIAGNOSIS — M5416 Radiculopathy, lumbar region: Secondary | ICD-10-CM

## 2020-07-15 DIAGNOSIS — M48061 Spinal stenosis, lumbar region without neurogenic claudication: Secondary | ICD-10-CM | POA: Diagnosis not present

## 2020-08-08 DIAGNOSIS — M48061 Spinal stenosis, lumbar region without neurogenic claudication: Secondary | ICD-10-CM | POA: Diagnosis not present

## 2020-08-09 ENCOUNTER — Other Ambulatory Visit: Payer: Self-pay | Admitting: Neurological Surgery

## 2020-08-23 NOTE — Progress Notes (Signed)
[facility] - [place], [place] - [address] [facility]. [address] [place] [address] Phone: [phone] Fax: [phone]      Your procedure is scheduled on 08/28/20.  Report to [facility] Main Entrance "A" at 10:15 A.M., and check in at the Admitting office.  Call this number if you have problems the morning of surgery:  [phone]  Call [phone] if you have any questions prior to your surgery date Monday-Friday 8am-4pm    Remember:  Do not eat or drink after midnight the night before your surgery     Take these medicines the morning of surgery with A SIP OF WATER:  NONE  As of today, STOP taking any Aspirin (unless otherwise instructed by your surgeon) Aleve, Naproxen, Ibuprofen, Motrin, Advil, Goody's, BC's, all herbal medications, fish oil, and all vitamins.                      Do not wear jewelry            Do not wear lotions, powders, colognes, or deodorant.            Do not shave 48 hours prior to surgery.  Men may shave face and neck.            Do not bring valuables to the hospital.            [facility] is not responsible for any belongings or valuables.  Do NOT Smoke (Tobacco/Vaping) or drink Alcohol 24 hours prior to your procedure If you use a CPAP at night, you may bring all equipment for your overnight stay.   Contacts, glasses, dentures or bridgework may not be worn into surgery.      For patients admitted to the hospital, discharge time will be determined by your treatment team.   Patients discharged the day of surgery will not be allowed to drive home, and someone needs to stay with them for 24 hours.    Special instructions:   [facility]- Preparing For Surgery  Before surgery, you can play an important role. Because skin is not sterile, your skin needs to be as free of germs as possible. You can reduce the number of germs on your skin by washing with CHG (chlorahexidine gluconate) Soap before surgery.  CHG  is an antiseptic cleaner which kills germs and bonds with the skin to continue killing germs even after washing.    Oral Hygiene is also important to reduce your risk of infection.  Remember - BRUSH YOUR TEETH THE MORNING OF SURGERY WITH YOUR REGULAR TOOTHPASTE  Please do not use if you have an allergy to CHG or antibacterial soaps. If your skin becomes reddened/irritated stop using the CHG.  Do not shave (including legs and underarms) for at least 48 hours prior to first CHG shower. It is OK to shave your face.  Please follow these instructions carefully.   1. Shower the NIGHT BEFORE SURGERY and the MORNING OF SURGERY with CHG Soap.   2. If you chose to wash your hair, wash your hair first as usual with your normal shampoo.  3. After you shampoo, rinse your hair and body thoroughly to remove the shampoo.  4. Use CHG as you would any other liquid soap. You can apply CHG directly to the skin and wash gently with a scrungie or a clean washcloth.   5. Apply the CHG Soap to your body ONLY FROM THE NECK DOWN.  Do  not use on open wounds or open sores. Avoid contact with your eyes, ears, mouth and genitals (private parts). Wash Face and genitals (private parts)  with your normal soap.   6. Wash thoroughly, paying special attention to the area where your surgery will be performed.  7. Thoroughly rinse your body with warm water from the neck down.  8. DO NOT shower/wash with your normal soap after using and rinsing off the CHG Soap.  9. Pat yourself dry with a CLEAN TOWEL.  10. Wear CLEAN PAJAMAS to bed the night before surgery  11. Place CLEAN SHEETS on your bed the night of your first shower and DO NOT SLEEP WITH PETS.   Day of Surgery: Wear Clean/Comfortable clothing the morning of surgery Do not apply any deodorants/lotions.   Remember to brush your teeth WITH YOUR REGULAR TOOTHPASTE.   Please read over the following fact sheets that you were given.

## 2020-08-24 ENCOUNTER — Other Ambulatory Visit: Payer: Self-pay

## 2020-08-24 ENCOUNTER — Encounter (HOSPITAL_COMMUNITY)
Admission: RE | Admit: 2020-08-24 | Discharge: 2020-08-24 | Disposition: A | Discharge status: Home / Self Care | Payer: PPO | Source: Ambulatory Visit | Attending: Neurological Surgery | Admitting: Neurological Surgery

## 2020-08-24 ENCOUNTER — Encounter (HOSPITAL_COMMUNITY): Payer: Self-pay

## 2020-08-24 ENCOUNTER — Ambulatory Visit (HOSPITAL_COMMUNITY)
Admission: RE | Admit: 2020-08-24 | Discharge: 2020-08-24 | Disposition: A | Discharge status: Home / Self Care | Payer: PPO | Source: Ambulatory Visit | Attending: Neurological Surgery | Admitting: Neurological Surgery

## 2020-08-24 ENCOUNTER — Other Ambulatory Visit (HOSPITAL_COMMUNITY)
Admission: RE | Admit: 2020-08-24 | Discharge: 2020-08-24 | Disposition: A | Discharge status: Home / Self Care | Payer: PPO | Source: Ambulatory Visit | Attending: Neurological Surgery | Admitting: Neurological Surgery

## 2020-08-24 DIAGNOSIS — M48061 Spinal stenosis, lumbar region without neurogenic claudication: Secondary | ICD-10-CM

## 2020-08-24 DIAGNOSIS — Z01818 Encounter for other preprocedural examination: Secondary | ICD-10-CM | POA: Insufficient documentation

## 2020-08-24 DIAGNOSIS — Z20822 Contact with and (suspected) exposure to covid-19: Secondary | ICD-10-CM | POA: Diagnosis not present

## 2020-08-24 LAB — CBC WITH DIFFERENTIAL/PLATELET
Abs Immature Granulocytes: 0.01 10*3/uL (ref 0.00–0.07)
Basophils Absolute: 0.1 10*3/uL (ref 0.0–0.1)
Basophils Relative: 1 %
Eosinophils Absolute: 0.2 10*3/uL (ref 0.0–0.5)
Eosinophils Relative: 3 %
HCT: 46 % (ref 39.0–52.0)
Hemoglobin: 15.5 g/dL (ref 13.0–17.0)
Immature Granulocytes: 0 %
Lymphocytes Relative: 35 %
Lymphs Abs: 2.1 10*3/uL (ref 0.7–4.0)
MCH: 32.2 pg (ref 26.0–34.0)
MCHC: 33.7 g/dL (ref 30.0–36.0)
MCV: 95.6 fL (ref 80.0–100.0)
Monocytes Absolute: 0.8 10*3/uL (ref 0.1–1.0)
Monocytes Relative: 13 %
Neutro Abs: 2.9 10*3/uL (ref 1.7–7.7)
Neutrophils Relative %: 48 %
Platelets: 199 10*3/uL (ref 150–400)
RBC: 4.81 MIL/uL (ref 4.22–5.81)
RDW: 12.9 % (ref 11.5–15.5)
WBC: 6.1 10*3/uL (ref 4.0–10.5)
nRBC: 0 % (ref 0.0–0.2)

## 2020-08-24 LAB — SURGICAL PCR SCREEN
MRSA, PCR: NEGATIVE
Staphylococcus aureus: NEGATIVE

## 2020-08-24 LAB — PROTIME-INR
INR: 1.1 (ref 0.8–1.2)
Prothrombin Time: 14 seconds (ref 11.4–15.2)

## 2020-08-24 LAB — BASIC METABOLIC PANEL
Anion gap: 8 (ref 5–15)
BUN: 18 mg/dL (ref 8–23)
CO2: 25 mmol/L (ref 22–32)
Calcium: 9 mg/dL (ref 8.9–10.3)
Chloride: 106 mmol/L (ref 98–111)
Creatinine, Ser: 0.83 mg/dL (ref 0.61–1.24)
GFR calc non Af Amer: >60 mL/min (ref >60)
Glucose, Bld: 96 mg/dL (ref 70–99)
Potassium: 3.9 mmol/L (ref 3.5–5.1)
Sodium: 139 mmol/L (ref 135–145)

## 2020-08-24 LAB — SARS CORONAVIRUS 2 (TAT 6-24 HRS): SARS Coronavirus 2: NEGATIVE

## 2020-08-24 MED ORDER — CHLORHEXIDINE GLUCONATE CLOTH 2 % EX PADS: 6.0000 | MEDICATED_PAD | Freq: Once | CUTANEOUS | Status: DC

## 2020-08-24 NOTE — Progress Notes (Signed)
PCP - Marden Noble, MD Cardiologist - pt denies  Chest x-ray - 08/24/20 EKG - 08/24/20 Stress Test - pt denies ECHO - pt denies Cardiac Cath - pt denies  Sleep Study - yes CPAP - CPAP - does not know settings on CPAP    Blood Thinner Instructions: n/a Aspirin Instructions: Follow your surgeon's instructions on when to stop Aspirin.  If no instructions were given by your surgeon then you will need to call the office to get those instructions.    Per pt last dose ASA 08/21/20  ERAS Protcol - n/a  COVID TEST- 08/24/20  Coronavirus Screening  Have you experienced the following symptoms:  Cough yes/no: No Fever (>100.48F)  yes/no: No Runny nose yes/no: No Sore throat yes/no: No Difficulty breathing/shortness of breath  yes/no: No  Have you or a family member traveled in the last 14 days and where? yes/no: No   If the patient indicates "YES" to the above questions, their PAT will be rescheduled to limit the exposure to others and, the surgeon will be notified. THE PATIENT WILL NEED TO BE ASYMPTOMATIC FOR 14 DAYS.   If the patient is not experiencing any of these symptoms, the PAT nurse will instruct them to NOT bring anyone with them to their appointment since they may have these symptoms or traveled as well.   Please remind your patients and families that hospital visitation restrictions are in effect and the importance of the restrictions.     Anesthesia review: n/a  Patient denies shortness of breath, fever, cough and chest pain at PAT appointment   All instructions explained to the patient, with a verbal understanding of the material. Patient agrees to go over the instructions while at home for a better understanding. Patient also instructed to self quarantine after being tested for COVID-19. The opportunity to ask questions was provided.

## 2020-08-28 ENCOUNTER — Inpatient Hospital Stay (HOSPITAL_COMMUNITY)
Admission: RE | Admit: 2020-08-28 | Discharge: 2020-08-29 | DRG: 460 | Disposition: A | Discharge status: Home / Self Care | Payer: PPO | Source: Ambulatory Visit | Attending: Neurological Surgery | Admitting: Neurological Surgery

## 2020-08-28 ENCOUNTER — Encounter (HOSPITAL_COMMUNITY): Payer: Self-pay | Admitting: Neurological Surgery

## 2020-08-28 ENCOUNTER — Inpatient Hospital Stay (HOSPITAL_COMMUNITY): Payer: PPO | Admitting: Anesthesiology

## 2020-08-28 ENCOUNTER — Other Ambulatory Visit: Payer: Self-pay

## 2020-08-28 ENCOUNTER — Encounter (HOSPITAL_COMMUNITY)
Admission: RE | Disposition: A | Discharge status: Home / Self Care | Payer: Self-pay | Source: Ambulatory Visit | Attending: Neurological Surgery

## 2020-08-28 ENCOUNTER — Inpatient Hospital Stay (HOSPITAL_COMMUNITY): Payer: PPO

## 2020-08-28 ENCOUNTER — Inpatient Hospital Stay (HOSPITAL_COMMUNITY): Payer: PPO | Admitting: Vascular Surgery

## 2020-08-28 DIAGNOSIS — R531 Weakness: Secondary | ICD-10-CM | POA: Diagnosis not present

## 2020-08-28 DIAGNOSIS — Z419 Encounter for procedure for purposes other than remedying health state, unspecified: Secondary | ICD-10-CM

## 2020-08-28 DIAGNOSIS — Z87442 Personal history of urinary calculi: Secondary | ICD-10-CM

## 2020-08-28 DIAGNOSIS — Z7982 Long term (current) use of aspirin: Secondary | ICD-10-CM

## 2020-08-28 DIAGNOSIS — E785 Hyperlipidemia, unspecified: Secondary | ICD-10-CM | POA: Diagnosis present

## 2020-08-28 DIAGNOSIS — Z9049 Acquired absence of other specified parts of digestive tract: Secondary | ICD-10-CM

## 2020-08-28 DIAGNOSIS — G4730 Sleep apnea, unspecified: Secondary | ICD-10-CM | POA: Diagnosis present

## 2020-08-28 DIAGNOSIS — M48061 Spinal stenosis, lumbar region without neurogenic claudication: Secondary | ICD-10-CM | POA: Diagnosis not present

## 2020-08-28 DIAGNOSIS — M47816 Spondylosis without myelopathy or radiculopathy, lumbar region: Secondary | ICD-10-CM | POA: Diagnosis not present

## 2020-08-28 DIAGNOSIS — Z9889 Other specified postprocedural states: Secondary | ICD-10-CM

## 2020-08-28 DIAGNOSIS — Z79899 Other long term (current) drug therapy: Secondary | ICD-10-CM | POA: Diagnosis not present

## 2020-08-28 DIAGNOSIS — M48062 Spinal stenosis, lumbar region with neurogenic claudication: Principal | ICD-10-CM | POA: Diagnosis present

## 2020-08-28 DIAGNOSIS — M532X6 Spinal instabilities, lumbar region: Secondary | ICD-10-CM | POA: Diagnosis not present

## 2020-08-28 DIAGNOSIS — I700 Atherosclerosis of aorta: Secondary | ICD-10-CM | POA: Diagnosis not present

## 2020-08-28 DIAGNOSIS — Z981 Arthrodesis status: Secondary | ICD-10-CM | POA: Diagnosis not present

## 2020-08-28 HISTORY — PX: LUMBAR LAMINECTOMY/DECOMPRESSION MICRODISCECTOMY: SHX5026

## 2020-08-28 LAB — ABO/RH: ABO/RH(D): O POS

## 2020-08-28 LAB — TYPE AND SCREEN
ABO/RH(D): O POS
Antibody Screen: NEGATIVE

## 2020-08-28 SURGERY — LUMBAR LAMINECTOMY/DECOMPRESSION MICRODISCECTOMY 4 LEVEL
Anesthesia: General | Site: Spine Lumbar

## 2020-08-28 MED ORDER — METHOCARBAMOL 500 MG PO TABS
500.0000 mg | ORAL_TABLET | Freq: Four times a day (QID) | ORAL | Status: DC | PRN
  Administered 2020-08-28: 500 mg via ORAL
  Filled 2020-08-28: qty 1

## 2020-08-28 MED ORDER — ONDANSETRON HCL 4 MG/2ML IJ SOLN: 4.0000 mg | Freq: Four times a day (QID) | INTRAMUSCULAR | Status: DC | PRN

## 2020-08-28 MED ORDER — MENTHOL 3 MG MT LOZG: 1.0000 | LOZENGE | OROMUCOSAL | Status: DC | PRN

## 2020-08-28 MED ORDER — ONDANSETRON HCL 4 MG PO TABS: 4.0000 mg | ORAL_TABLET | Freq: Four times a day (QID) | ORAL | Status: DC | PRN

## 2020-08-28 MED ORDER — MORPHINE SULFATE (PF) 2 MG/ML IV SOLN: 2.0000 mg | INTRAVENOUS | Status: DC | PRN

## 2020-08-28 MED ORDER — ACETAMINOPHEN 325 MG PO TABS
650.0000 mg | ORAL_TABLET | ORAL | Status: DC | PRN
  Administered 2020-08-29: 650 mg via ORAL
  Filled 2020-08-28: qty 2

## 2020-08-28 MED ORDER — PHENYLEPHRINE HCL-NACL 10-0.9 MG/250ML-% IV SOLN
INTRAVENOUS | Status: DC | PRN
  Administered 2020-08-28: 50 ug/min via INTRAVENOUS

## 2020-08-28 MED ORDER — SUGAMMADEX SODIUM 200 MG/2ML IV SOLN
INTRAVENOUS | Status: DC | PRN
  Administered 2020-08-28: 200 mg via INTRAVENOUS

## 2020-08-28 MED ORDER — SODIUM CHLORIDE 0.9 % IV SOLN: 250.0000 mL | INTRAVENOUS | Status: DC

## 2020-08-28 MED ORDER — ORAL CARE MOUTH RINSE: 15.0000 mL | Freq: Once | OROMUCOSAL | Status: AC

## 2020-08-28 MED ORDER — PHENOL 1.4 % MT LIQD: 1.0000 | OROMUCOSAL | Status: DC | PRN

## 2020-08-28 MED ORDER — LIDOCAINE 2% (20 MG/ML) 5 ML SYRINGE
INTRAMUSCULAR | Status: DC | PRN
  Administered 2020-08-28: 80 mg via INTRAVENOUS

## 2020-08-28 MED ORDER — SODIUM CHLORIDE 0.9% FLUSH: 3.0000 mL | Freq: Two times a day (BID) | INTRAVENOUS | Status: DC

## 2020-08-28 MED ORDER — ACETAMINOPHEN 500 MG PO TABS
1000.0000 mg | ORAL_TABLET | Freq: Once | ORAL | Status: AC
  Administered 2020-08-28: 1000 mg via ORAL
  Filled 2020-08-28: qty 2

## 2020-08-28 MED ORDER — ROCURONIUM BROMIDE 10 MG/ML (PF) SYRINGE
PREFILLED_SYRINGE | INTRAVENOUS | Status: DC | PRN
  Administered 2020-08-28: 40 mg via INTRAVENOUS
  Administered 2020-08-28: 60 mg via INTRAVENOUS

## 2020-08-28 MED ORDER — CHLORHEXIDINE GLUCONATE 0.12 % MT SOLN
15.0000 mL | Freq: Once | OROMUCOSAL | Status: AC
  Administered 2020-08-28: 15 mL via OROMUCOSAL
  Filled 2020-08-28: qty 15

## 2020-08-28 MED ORDER — HEMOSTATIC AGENTS (NO CHARGE) OPTIME
TOPICAL | Status: DC | PRN
  Administered 2020-08-28 (×2): 1 via TOPICAL

## 2020-08-28 MED ORDER — FENTANYL CITRATE (PF) 250 MCG/5ML IJ SOLN
INTRAMUSCULAR | Status: DC | PRN
Start: 2020-08-28 — End: 2020-08-28
  Administered 2020-08-28: 50 ug via INTRAVENOUS
  Administered 2020-08-28: 100 ug via INTRAVENOUS
  Administered 2020-08-28 (×5): 50 ug via INTRAVENOUS

## 2020-08-28 MED ORDER — ROCURONIUM BROMIDE 10 MG/ML (PF) SYRINGE
PREFILLED_SYRINGE | INTRAVENOUS | Status: AC
  Filled 2020-08-28: qty 10

## 2020-08-28 MED ORDER — ONDANSETRON HCL 4 MG/2ML IJ SOLN
INTRAMUSCULAR | Status: DC | PRN
  Administered 2020-08-28: 4 mg via INTRAVENOUS

## 2020-08-28 MED ORDER — PROPOFOL 10 MG/ML IV BOLUS
INTRAVENOUS | Status: AC
  Filled 2020-08-28: qty 20

## 2020-08-28 MED ORDER — FENTANYL CITRATE (PF) 100 MCG/2ML IJ SOLN: 25.0000 ug | INTRAMUSCULAR | Status: DC | PRN

## 2020-08-28 MED ORDER — METHOCARBAMOL 1000 MG/10ML IJ SOLN
500.0000 mg | Freq: Four times a day (QID) | INTRAVENOUS | Status: DC | PRN
  Filled 2020-08-28: qty 5

## 2020-08-28 MED ORDER — PROPOFOL 10 MG/ML IV BOLUS
INTRAVENOUS | Status: DC | PRN
  Administered 2020-08-28: 170 mg via INTRAVENOUS

## 2020-08-28 MED ORDER — THROMBIN 5000 UNITS EX SOLR
OROMUCOSAL | Status: DC | PRN
  Administered 2020-08-28: 5 mL via TOPICAL

## 2020-08-28 MED ORDER — OXYCODONE HCL 5 MG PO TABS
10.0000 mg | ORAL_TABLET | ORAL | Status: DC | PRN
  Administered 2020-08-28 – 2020-08-29 (×4): 10 mg via ORAL
  Filled 2020-08-28 (×4): qty 2

## 2020-08-28 MED ORDER — SENNA 8.6 MG PO TABS
1.0000 | ORAL_TABLET | Freq: Two times a day (BID) | ORAL | Status: DC
  Administered 2020-08-28 – 2020-08-29 (×2): 8.6 mg via ORAL
  Filled 2020-08-28 (×2): qty 1

## 2020-08-28 MED ORDER — POTASSIUM CHLORIDE IN NACL 20-0.9 MEQ/L-% IV SOLN: INTRAVENOUS | Status: DC

## 2020-08-28 MED ORDER — THROMBIN 20000 UNITS EX SOLR
CUTANEOUS | Status: AC
  Filled 2020-08-28: qty 20000

## 2020-08-28 MED ORDER — FENTANYL CITRATE (PF) 250 MCG/5ML IJ SOLN
INTRAMUSCULAR | Status: AC
  Filled 2020-08-28: qty 5

## 2020-08-28 MED ORDER — LACTATED RINGERS IV SOLN: INTRAVENOUS | Status: DC

## 2020-08-28 MED ORDER — PROMETHAZINE HCL 25 MG/ML IJ SOLN: 6.2500 mg | INTRAMUSCULAR | Status: DC | PRN

## 2020-08-28 MED ORDER — ACETAMINOPHEN 650 MG RE SUPP: 650.0000 mg | RECTAL | Status: DC | PRN

## 2020-08-28 MED ORDER — DEXAMETHASONE SODIUM PHOSPHATE 10 MG/ML IJ SOLN
10.0000 mg | Freq: Once | INTRAMUSCULAR | Status: AC
  Administered 2020-08-28: 10 mg via INTRAVENOUS
  Filled 2020-08-28: qty 1

## 2020-08-28 MED ORDER — ADULT MULTIVITAMIN W/MINERALS CH
1.0000 | ORAL_TABLET | Freq: Every day | ORAL | Status: DC
  Administered 2020-08-28: 1 via ORAL
  Filled 2020-08-28: qty 1

## 2020-08-28 MED ORDER — LIDOCAINE 2% (20 MG/ML) 5 ML SYRINGE
INTRAMUSCULAR | Status: AC
  Filled 2020-08-28: qty 5

## 2020-08-28 MED ORDER — CEFAZOLIN SODIUM-DEXTROSE 2-4 GM/100ML-% IV SOLN
2.0000 g | INTRAVENOUS | Status: AC
  Administered 2020-08-28: 2 g via INTRAVENOUS
  Filled 2020-08-28: qty 100

## 2020-08-28 MED ORDER — SCOPOLAMINE 1 MG/3DAYS TD PT72
1.0000 | MEDICATED_PATCH | TRANSDERMAL | Status: DC
  Administered 2020-08-28: 1.5 mg via TRANSDERMAL
  Filled 2020-08-28: qty 1

## 2020-08-28 MED ORDER — ONDANSETRON HCL 4 MG/2ML IJ SOLN
INTRAMUSCULAR | Status: AC
  Filled 2020-08-28: qty 2

## 2020-08-28 MED ORDER — CELECOXIB 200 MG PO CAPS
200.0000 mg | ORAL_CAPSULE | Freq: Two times a day (BID) | ORAL | Status: DC
  Administered 2020-08-28 – 2020-08-29 (×2): 200 mg via ORAL
  Filled 2020-08-28 (×2): qty 1

## 2020-08-28 MED ORDER — ASPIRIN EC 81 MG PO TBEC
81.0000 mg | DELAYED_RELEASE_TABLET | Freq: Every day | ORAL | Status: DC
  Administered 2020-08-28: 81 mg via ORAL
  Filled 2020-08-28: qty 1

## 2020-08-28 MED ORDER — BUPIVACAINE HCL (PF) 0.25 % IJ SOLN
INTRAMUSCULAR | Status: AC
  Filled 2020-08-28: qty 30

## 2020-08-28 MED ORDER — 0.9 % SODIUM CHLORIDE (POUR BTL) OPTIME
TOPICAL | Status: DC | PRN
  Administered 2020-08-28: 1000 mL

## 2020-08-28 MED ORDER — CEFAZOLIN SODIUM-DEXTROSE 2-4 GM/100ML-% IV SOLN
2.0000 g | Freq: Three times a day (TID) | INTRAVENOUS | Status: AC
  Administered 2020-08-28 – 2020-08-29 (×2): 2 g via INTRAVENOUS
  Filled 2020-08-28 (×2): qty 100

## 2020-08-28 MED ORDER — THROMBIN 20000 UNITS EX SOLR
CUTANEOUS | Status: DC | PRN
  Administered 2020-08-28: 20000 [IU] via TOPICAL

## 2020-08-28 MED ORDER — BUPIVACAINE HCL (PF) 0.25 % IJ SOLN
INTRAMUSCULAR | Status: DC | PRN
  Administered 2020-08-28: 10 mL

## 2020-08-28 MED ORDER — THROMBIN 5000 UNITS EX SOLR
CUTANEOUS | Status: AC
  Filled 2020-08-28: qty 5000

## 2020-08-28 MED ORDER — CELECOXIB 200 MG PO CAPS
200.0000 mg | ORAL_CAPSULE | Freq: Once | ORAL | Status: AC
  Administered 2020-08-28: 200 mg via ORAL
  Filled 2020-08-28: qty 1

## 2020-08-28 MED ORDER — EPHEDRINE SULFATE-NACL 50-0.9 MG/10ML-% IV SOSY
PREFILLED_SYRINGE | INTRAVENOUS | Status: DC | PRN
  Administered 2020-08-28 (×2): 10 mg via INTRAVENOUS

## 2020-08-28 MED ORDER — SODIUM CHLORIDE 0.9% FLUSH: 3.0000 mL | INTRAVENOUS | Status: DC | PRN

## 2020-08-28 SURGICAL SUPPLY — 59 items
ADH SKN CLS APL DERMABOND .7 (GAUZE/BANDAGES/DRESSINGS) ×1
APL SKNCLS STERI-STRIP NONHPOA (GAUZE/BANDAGES/DRESSINGS) ×1
BAG DECANTER FOR FLEXI CONT (MISCELLANEOUS) ×3 IMPLANT
BAND INSRT 18 STRL LF DISP RB (MISCELLANEOUS) ×2
BAND RUBBER #18 3X1/16 STRL (MISCELLANEOUS) ×6 IMPLANT
BENZOIN TINCTURE PRP APPL 2/3 (GAUZE/BANDAGES/DRESSINGS) ×3 IMPLANT
BUR CARBIDE MATCH 3.0 (BURR) ×3 IMPLANT
CANISTER SUCT 3000ML PPV (MISCELLANEOUS) ×3 IMPLANT
CLOSURE WOUND 1/2 X4 (GAUZE/BANDAGES/DRESSINGS) ×2
COVER WAND RF STERILE (DRAPES) ×1 IMPLANT
DERMABOND ADVANCED (GAUZE/BANDAGES/DRESSINGS) ×2
DERMABOND ADVANCED .7 DNX12 (GAUZE/BANDAGES/DRESSINGS) IMPLANT
DIFFUSER DRILL AIR PNEUMATIC (MISCELLANEOUS) ×3 IMPLANT
DRAPE LAPAROTOMY 100X72X124 (DRAPES) ×3 IMPLANT
DRAPE MICROSCOPE LEICA (MISCELLANEOUS) ×3 IMPLANT
DRAPE SURG 17X23 STRL (DRAPES) ×3 IMPLANT
DRSG OPSITE POSTOP 4X8 (GAUZE/BANDAGES/DRESSINGS) ×2 IMPLANT
DURAPREP 26ML APPLICATOR (WOUND CARE) ×3 IMPLANT
ELECT REM PT RETURN 9FT ADLT (ELECTROSURGICAL) ×3
ELECTRODE REM PT RTRN 9FT ADLT (ELECTROSURGICAL) ×1 IMPLANT
EVACUATOR 1/8 PVC DRAIN (DRAIN) ×2 IMPLANT
GAUZE 4X4 16PLY RFD (DISPOSABLE) IMPLANT
GLOVE BIO SURGEON STRL SZ 6.5 (GLOVE) ×4 IMPLANT
GLOVE BIO SURGEON STRL SZ7 (GLOVE) ×2 IMPLANT
GLOVE BIO SURGEON STRL SZ7.5 (GLOVE) ×2 IMPLANT
GLOVE BIO SURGEON STRL SZ8 (GLOVE) ×3 IMPLANT
GLOVE BIO SURGEONS STRL SZ 6.5 (GLOVE) ×4
GLOVE BIOGEL PI IND STRL 6.5 (GLOVE) IMPLANT
GLOVE BIOGEL PI IND STRL 7.0 (GLOVE) IMPLANT
GLOVE BIOGEL PI IND STRL 7.5 (GLOVE) IMPLANT
GLOVE BIOGEL PI INDICATOR 6.5 (GLOVE) ×4
GLOVE BIOGEL PI INDICATOR 7.0 (GLOVE) ×2
GLOVE BIOGEL PI INDICATOR 7.5 (GLOVE) ×2
GLOVE SURG SS PI 6.0 STRL IVOR (GLOVE) ×2 IMPLANT
GLOVE SURG SS PI 7.0 STRL IVOR (GLOVE) ×2 IMPLANT
GOWN STRL REUS W/ TWL LRG LVL3 (GOWN DISPOSABLE) IMPLANT
GOWN STRL REUS W/ TWL XL LVL3 (GOWN DISPOSABLE) ×1 IMPLANT
GOWN STRL REUS W/TWL 2XL LVL3 (GOWN DISPOSABLE) IMPLANT
GOWN STRL REUS W/TWL LRG LVL3 (GOWN DISPOSABLE) ×9
GOWN STRL REUS W/TWL XL LVL3 (GOWN DISPOSABLE) ×6
HEMOSTAT POWDER KIT SURGIFOAM (HEMOSTASIS) ×2 IMPLANT
KIT BASIN OR (CUSTOM PROCEDURE TRAY) ×3 IMPLANT
KIT TURNOVER KIT B (KITS) ×3 IMPLANT
NDL HYPO 25X1 1.5 SAFETY (NEEDLE) ×1 IMPLANT
NDL SPNL 20GX3.5 QUINCKE YW (NEEDLE) IMPLANT
NEEDLE HYPO 25X1 1.5 SAFETY (NEEDLE) ×3 IMPLANT
NEEDLE SPNL 20GX3.5 QUINCKE YW (NEEDLE) IMPLANT
NS IRRIG 1000ML POUR BTL (IV SOLUTION) ×3 IMPLANT
PACK LAMINECTOMY NEURO (CUSTOM PROCEDURE TRAY) ×3 IMPLANT
PAD ARMBOARD 7.5X6 YLW CONV (MISCELLANEOUS) ×13 IMPLANT
SPONGE SURGIFOAM ABS GEL SZ50 (HEMOSTASIS) ×4 IMPLANT
STRIP CLOSURE SKIN 1/2X4 (GAUZE/BANDAGES/DRESSINGS) ×3 IMPLANT
SUT VIC AB 0 CT1 18XCR BRD8 (SUTURE) ×1 IMPLANT
SUT VIC AB 0 CT1 8-18 (SUTURE) ×6
SUT VIC AB 2-0 CP2 18 (SUTURE) ×5 IMPLANT
SUT VIC AB 3-0 SH 8-18 (SUTURE) ×7 IMPLANT
TOWEL GREEN STERILE (TOWEL DISPOSABLE) ×3 IMPLANT
TOWEL GREEN STERILE FF (TOWEL DISPOSABLE) ×3 IMPLANT
WATER STERILE IRR 1000ML POUR (IV SOLUTION) ×3 IMPLANT

## 2020-08-28 NOTE — Anesthesia Preprocedure Evaluation (Addendum)
Anesthesia Evaluation  Patient identified by MRN, date of birth, ID band Patient awake    Reviewed: Allergy & Precautions, NPO status , Patient's Chart, lab work & pertinent test results  History of Anesthesia Complications Negative for: history of anesthetic complications  Airway Mallampati: II  TM Distance: >3 FB Neck ROM: Full    Dental no notable dental hx. (+) Dental Advisory Given   Pulmonary sleep apnea and Continuous Positive Airway Pressure Ventilation ,    Pulmonary exam normal        Cardiovascular negative cardio ROS Normal cardiovascular exam     Neuro/Psych negative neurological ROS  negative psych ROS   GI/Hepatic negative GI ROS, Neg liver ROS,   Endo/Other  negative endocrine ROS  Renal/GU negative Renal ROS  negative genitourinary   Musculoskeletal negative musculoskeletal ROS (+)   Abdominal (+)  Abdomen: soft. Bowel sounds: normal.  Peds negative pediatric ROS (+)  Hematology negative hematology ROS (+)   Anesthesia Other Findings   Reproductive/Obstetrics negative OB ROS                           Anesthesia Physical  Anesthesia Plan  ASA: II  Anesthesia Plan: General   Post-op Pain Management:    Induction: Intravenous  PONV Risk Score and Plan: 3 and Ondansetron, Dexamethasone, Treatment may vary due to age or medical condition and Scopolamine patch - Pre-op  Airway Management Planned: Oral ETT  Additional Equipment:   Intra-op Plan:   Post-operative Plan: Extubation in OR  Informed Consent: I have reviewed the patients History and Physical, chart, labs and discussed the procedure including the risks, benefits and alternatives for the proposed anesthesia with the patient or authorized representative who has indicated his/her understanding and acceptance.     Dental advisory given  Plan Discussed with: Anesthesiologist and CRNA  Anesthesia Plan  Comments:        Anesthesia Quick Evaluation

## 2020-08-28 NOTE — Op Note (Signed)
08/28/2020  3:39 PM  PATIENT:  Duane Greene  69 y.o. male  PRE-OPERATIVE DIAGNOSIS: Lumbar spinal stenosis L1-2, L2-3, L3-4, L4-5, L5-S1, with neurogenic claudication and leg weakness  POST-OPERATIVE DIAGNOSIS:  Same, with suspected instability L4-5  PROCEDURE:  1.  Intertransverse arthrodesis L4-5 utilizing locally harvested morselized autologous bone graft, 2.  Decompressive lumbar laminectomy, medial facetectomy foraminotomy L1-2, L2-3, L3-4, L4-5, L5-S1  SURGEON:  Marikay Alar, MD  ASSISTANTS: Verlin Dike, FNP  ANESTHESIA:   General  EBL: 300 ml  Total I/O In: 1000 [I.V.:1000] Out: 300 [Blood:300]  BLOOD ADMINISTERED: none  DRAINS: Dual medium Hemovac  SPECIMEN:  none  INDICATION FOR PROCEDURE: This patient presented with back and hip pain with neurogenic claudication and the feeling of weakness in his legs with ambulation. Imaging showed severe spinal stenosis L1-2 L2 3 L3-4 L4-5 L5-S1 with minimal anterior listhesis L4-5. The patient tried conservative measures without relief. Pain was debilitating. Recommended decompressive laminectomy L1-S1. Patient understood the risks, benefits, and alternatives and potential outcomes and wished to proceed.  PROCEDURE DETAILS: The patient was taken to the operating room and after induction of adequate generalized endotracheal anesthesia, the patient was rolled into the prone position on the Wilson frame and all pressure points were padded. The lumbar region was cleaned and then prepped with DuraPrep and draped in the usual sterile fashion. 5 cc of local anesthesia was injected and then a dorsal midline incision was made and carried down to the lumbo sacral fascia. The fascia was opened and the paraspinous musculature was taken down in a subperiosteal fashion to expose L1-S1 bilaterally. Intraoperative x-ray confirmed my level, and then I used a combination of the high-speed drill and the Kerrison punches to perform a hemilaminectomy,  medial facetectomy, and foraminotomy at L5-S1 bilaterally.  I removed the spinous process of L1, L2, L3 and L4 and perform complete laminectomies, medial facetectomies and foraminotomies at L1-2, L2-3, L3-4, and L4-5.  The underlying yellow ligament was opened and removed in a piecemeal fashion to expose the underlying dura and exiting nerve root. I undercut the lateral recess and dissected down until I was medial to and distal to the pedicle at each level. The nerve root was well decompressed at each level.  I then palpated with a coronary dilator along the nerve root and into the foramen to assure adequate decompression. I felt no more compression of the nerve root at any level.  The radiologist did mention some anterior listhesis at L4-5 and he had significant facet arthropathy and we were concerned with a full laminectomy that he would develop iatrogenic instability.  We decided to expose the transverse processes of L4 and L5 bilaterally.  We decorticated these with the high-speed drill and then placed locally harvested morselized autologous bone graft bilaterally to perform intertransverse arthrodesis at L4-5.  We did not feel the remainder of the levels needed to be fused in this way.  I irrigated with saline solution containing bacitracin. Achieved hemostasis with bipolar cautery, lined the dura with Gelfoam, placed 2 medium Hemovac drains through separate stab incisions, and then closed the fascia with 0 Vicryl. I closed the subcutaneous tissues with 2-0 Vicryl and the subcuticular tissues with 3-0 Vicryl. The skin was then closed with benzoin and Steri-Strips. The drapes were removed, a sterile dressing was applied.  My nurse practitioner was involved in the exposure, safe retraction of the neural elements, decompression of the neural elements, the fusion, and the closure. the patient was awakened from general anesthesia  and transferred to the recovery room in stable condition. At the end of the procedure  all sponge, needle and instrument counts were correct.    PLAN OF CARE: Admit for overnight observation  PATIENT DISPOSITION:  PACU - hemodynamically stable.   Delay start of Pharmacological VTE agent (>24hrs) due to surgical blood loss or risk of bleeding:  yes

## 2020-08-28 NOTE — Anesthesia Procedure Notes (Signed)
Procedure Name: Intubation Date/Time: 08/28/2020 12:30 PM Performed by: Rosiland Oz, CRNA Pre-anesthesia Checklist: Patient identified, Emergency Drugs available, Suction available, Patient being monitored and Timeout performed Patient Re-evaluated:Patient Re-evaluated prior to induction Oxygen Delivery Method: Circle system utilized Preoxygenation: Pre-oxygenation with 100% oxygen Induction Type: IV induction Ventilation: Mask ventilation without difficulty Laryngoscope Size: Miller and 3 Grade View: Grade II Tube type: Oral Tube size: 7.5 mm Number of attempts: 1 Airway Equipment and Method: Stylet Placement Confirmation: ETT inserted through vocal cords under direct vision,  positive ETCO2 and breath sounds checked- equal and bilateral Secured at: 22 cm Tube secured with: Tape Dental Injury: Teeth and Oropharynx as per pre-operative assessment

## 2020-08-28 NOTE — Anesthesia Postprocedure Evaluation (Signed)
Anesthesia Post Note  Patient: Duane Greene  Procedure(s) Performed: Laminectomy - Lumbar One-Two, Lumbar Two-Three, Lumbar Three-Four, Lumbar Four-Five, Lumbar Five-Sacral One (N/A Spine Lumbar)     Patient location during evaluation: PACU Anesthesia Type: General Level of consciousness: sedated Pain management: pain level controlled Vital Signs Assessment: post-procedure vital signs reviewed and stable Respiratory status: spontaneous breathing and respiratory function stable Cardiovascular status: stable Postop Assessment: no apparent nausea or vomiting Anesthetic complications: no   No complications documented.  Last Vitals:  Vitals:   08/28/20 1555 08/28/20 1610  BP: 126/71 127/67  Pulse: 80 81  Resp: 19 20  Temp:    SpO2: 92% 93%    Last Pain:  Vitals:   08/28/20 1610  TempSrc:   PainSc: 0-No pain                 Alby Schwabe DANIEL

## 2020-08-28 NOTE — Transfer of Care (Signed)
Immediate Anesthesia Transfer of Care Note  Patient: Duane Greene  Procedure(s) Performed: Laminectomy - Lumbar One-Two, Lumbar Two-Three, Lumbar Three-Four, Lumbar Four-Five, Lumbar Five-Sacral One (N/A Spine Lumbar)  Patient Location: PACU  Anesthesia Type:General  Level of Consciousness: drowsy and patient cooperative  Airway & Oxygen Therapy: Patient Spontanous Breathing  Post-op Assessment: Report given to RN and Post -op Vital signs reviewed and stable  Post vital signs: Reviewed and stable  Last Vitals:  Vitals Value Taken Time  BP 141/71 08/28/20 1539  Temp    Pulse 91 08/28/20 1540  Resp 12 08/28/20 1540  SpO2 95 % 08/28/20 1540  Vitals shown include unvalidated device data.  Last Pain:  Vitals:   08/28/20 1052  TempSrc:   PainSc: 2       Patients Stated Pain Goal: 3 (08/28/20 1052)  Complications: No complications documented.

## 2020-08-28 NOTE — H&P (Signed)
Subjective: Patient is a 69 y.o. male admitted for leg weakness. Onset of symptoms was several months ago, gradually worsening since that time.  The pain is rated moderate, and is located at the across the lower back and radiates to hips. The pain is described as aching and occurs intermittently. The symptoms have been progressive. Symptoms are exacerbated by exercise. MRI or CT showed severe stenosis   Past Medical History:  Diagnosis Date  . History of kidney stones   . Hyperlipidemia   . Right inguinal hernia   . Sleep apnea    uses CPAP nightly    Past Surgical History:  Procedure Laterality Date  . CHOLECYSTECTOMY N/A 06/13/2020   Procedure: LAPAROSCOPIC CHOLECYSTECTOMY;  Surgeon: Manus Rudd, MD;  Location: [facility] OR;  Service: General;  Laterality: N/A;  . COLONOSCOPY    . EXTRACORPOREAL SHOCK WAVE LITHOTRIPSY Left 10/18/2019   Procedure: EXTRACORPOREAL SHOCK WAVE LITHOTRIPSY (ESWL);  Surgeon: Heloise Purpura, MD;  Location: [facility] ORS;  Service: Urology;  Laterality: Left;  . INGUINAL HERNIA REPAIR Right 06/30/2019   Procedure: RIGHT INGUINAL HERNIA REPAIR WITH MESH;  Surgeon: Manus Rudd, MD;  Location: [facility];  Service: General;  Laterality: Right;  . INTRAOPERATIVE CHOLANGIOGRAM N/A 06/13/2020   Procedure: INTRAOPERATIVE CHOLANGIOGRAM;  Surgeon: Manus Rudd, MD;  Location: [facility] OR;  Service: General;  Laterality: N/A;  . KNEE ARTHROSCOPY Left   . SHOULDER ARTHROSCOPY Bilateral   . TONSILLECTOMY      Prior to Admission medications   Medication Sig Start Date End Date Taking? Authorizing Provider  aspirin 81 MG EC tablet Take 81 mg by mouth at bedtime.    Yes [provider]  Multiple Vitamins-Minerals (MULTIVITAMIN WITH MINERALS) tablet Take 1 tablet by mouth at bedtime.   Yes [provider]  simvastatin (ZOCOR) 80 MG tablet Take 40 mg by mouth at bedtime.    Yes [provider]  terbinafine (LAMISIL) 250 MG tablet Take 250 mg by  mouth every Sunday.  08/07/16  Yes [provider]  HYDROcodone-acetaminophen (NORCO/VICODIN) 5-325 MG tablet Take 1 tablet by mouth every 6 (six) hours as needed for moderate pain. Patient not taking: Reported on 08/15/2020 06/13/20   Manus Rudd, MD   No Known Allergies  Social History   Tobacco Use  . Smoking status: Never Smoker  . Smokeless tobacco: Never Used  Substance Use Topics  . Alcohol use: Never    History reviewed. No pertinent family history.   Review of Systems  Positive ROS: neg  All other systems have been reviewed and were otherwise negative with the exception of those mentioned in the HPI and as above.  Objective: Vital signs in last 24 hours: Temp:  [97.7 F (36.5 C)] 97.7 F (36.5 C) (10/11 1038) Pulse Rate:  [69] 69 (10/11 1038) Resp:  [18] 18 (10/11 1038) BP: (144)/(69) 144/69 (10/11 1038) SpO2:  [99 %] 99 % (10/11 1038) Weight:  [95.8 kg] 95.8 kg (10/11 1038)  General Appearance: Alert, cooperative, no distress, appears stated age Head: Normocephalic, without obvious abnormality, atraumatic Eyes: PERRL, conjunctiva/corneas clear, EOM's intact    Neck: Supple, symmetrical, trachea midline Back: Symmetric, no curvature, ROM normal, no CVA tenderness Lungs:  respirations unlabored Heart: Regular rate and rhythm Abdomen: Soft, non-tender Extremities: Extremities normal, atraumatic, no cyanosis or edema Pulses: 2+ and symmetric all extremities Skin: Skin color, texture, turgor normal, no rashes or lesions  NEUROLOGIC:   Mental status: Alert and oriented x4,  no aphasia, good attention span, fund  of knowledge, and memory Motor Exam - grossly normal Sensory Exam - grossly normal Reflexes: trace Coordination - grossly normal Gait - grossly normal Balance - grossly normal Cranial Nerves: I: smell Not tested  II: visual acuity  OS: nl    OD: nl  II: visual fields Full to confrontation  II: pupils Equal, round, reactive to light   III,VII: ptosis None  III,IV,VI: extraocular muscles  Full ROM  V: mastication Normal  V: facial light touch sensation  Normal  V,VII: corneal reflex  Present  VII: facial muscle function - upper  Normal  VII: facial muscle function - lower Normal  VIII: hearing Not tested  IX: soft palate elevation  Normal  IX,X: gag reflex Present  XI: trapezius strength  5/5  XI: sternocleidomastoid strength 5/5  XI: neck flexion strength  5/5  XII: tongue strength  Normal    Data Review Lab Results  Component Value Date   WBC 6.1 08/24/2020   HGB 15.5 08/24/2020   HCT 46.0 08/24/2020   MCV 95.6 08/24/2020   PLT 199 08/24/2020   Lab Results  Component Value Date   NA 139 08/24/2020   K 3.9 08/24/2020   CL 106 08/24/2020   CO2 25 08/24/2020   BUN 18 08/24/2020   CREATININE 0.83 08/24/2020   GLUCOSE 96 08/24/2020   Lab Results  Component Value Date   INR 1.1 08/24/2020    Assessment/Plan:  Estimated body mass index is 30.29 kg/m as calculated from the following:   Height as of this encounter: 5\' 10"  (1.778 m).   Weight as of this encounter: 95.8 kg. Patient admitted for [facility] for stenosis L1-S1. Patient has failed a reasonable attempt at conservative therapy.  I explained the condition and procedure to the patient and answered any questions.  Patient wishes to proceed with procedure as planned. Understands risks/ benefits and typical outcomes of procedure.   [facility] 08/28/2020 11:50 AM

## 2020-08-29 ENCOUNTER — Other Ambulatory Visit (HOSPITAL_COMMUNITY): Payer: Self-pay | Admitting: Neurological Surgery

## 2020-08-29 ENCOUNTER — Encounter (HOSPITAL_COMMUNITY): Payer: Self-pay | Admitting: Neurological Surgery

## 2020-08-29 DIAGNOSIS — E785 Hyperlipidemia, unspecified: Secondary | ICD-10-CM | POA: Diagnosis present

## 2020-08-29 DIAGNOSIS — Z7982 Long term (current) use of aspirin: Secondary | ICD-10-CM | POA: Diagnosis not present

## 2020-08-29 DIAGNOSIS — M48062 Spinal stenosis, lumbar region with neurogenic claudication: Secondary | ICD-10-CM | POA: Diagnosis present

## 2020-08-29 DIAGNOSIS — Z79899 Other long term (current) drug therapy: Secondary | ICD-10-CM | POA: Diagnosis not present

## 2020-08-29 DIAGNOSIS — Z87442 Personal history of urinary calculi: Secondary | ICD-10-CM | POA: Diagnosis not present

## 2020-08-29 DIAGNOSIS — G4730 Sleep apnea, unspecified: Secondary | ICD-10-CM | POA: Diagnosis present

## 2020-08-29 DIAGNOSIS — Z9049 Acquired absence of other specified parts of digestive tract: Secondary | ICD-10-CM | POA: Diagnosis not present

## 2020-08-29 MED ORDER — CELECOXIB 200 MG PO CAPS
200.0000 mg | ORAL_CAPSULE | Freq: Two times a day (BID) | ORAL | 0 refills | Status: DC
Start: 2020-08-29 — End: 2020-08-29

## 2020-08-29 MED ORDER — METHOCARBAMOL 500 MG PO TABS: 500.0000 mg | ORAL_TABLET | Freq: Four times a day (QID) | ORAL | 1 refills | Status: DC | PRN

## 2020-08-29 MED ORDER — OXYCODONE HCL 5 MG PO TABS
5.0000 mg | ORAL_TABLET | ORAL | 0 refills | Status: DC | PRN
Start: 2020-08-29 — End: 2020-08-29

## 2020-08-29 MED FILL — METHOCARBAMOL 500 MG TABS: 500 | 7 days supply | Qty: 30 | Fill #0

## 2020-08-29 MED FILL — oxyCODONE HCL 5 MG TABS: 5 | 5 days supply | Qty: 30 | Fill #0

## 2020-08-29 MED FILL — CELECOXIB 200 MG CAP: 200 | 4 days supply | Qty: 8 | Fill #0

## 2020-08-29 NOTE — Evaluation (Signed)
Occupational Therapy Evaluation and Discharge Patient Details Name: Duane Greene MRN: [id number] DOB: November 26, 1950 Today's Date: 08/29/2020    History of Present Illness Pt is a 69 y/o male now s/p lumbar laminectomy L1-S1 and non-instrumented fusion L4-5. PMHx includes R inguinal hernia, sleep apnea, previous cholecystectomy.    Clinical Impression   This 69 y/o male presents with the above. PTA pt performing functional mobility, ADL and iADL independently. Pt currently completing functional mobility without AD, UB and LB ADL tasks at supervision level. Educated pt re: back precautions, safety and compensatory techniques for completing ADL and mobility tasks while maintaining precautions with pt verbalizing and demonstrating good understanding throughout. Pt reports plans to return home with spouse assist for ADL/iADL PRN after discharge. All questions answered with no further acute OT needs identified. Pt anticipating d/c home today. Acute OT to sign off at this time, thank you for this referral.     Follow Up Recommendations  No OT follow up;Supervision - Intermittent    Equipment Recommendations  None recommended by OT           Precautions / Restrictions Precautions Precautions: Fall;Back Precaution Booklet Issued: Yes (comment) Precaution Comments: PT providing handout, reviewed precautions during session Required Braces or Orthoses:  ("no brace needed") Restrictions Weight Bearing Restrictions: No      Mobility Bed Mobility               General bed mobility comments: OOB upon arrival, verbally reviewed log roll technique   Transfers Overall transfer level: Needs assistance Equipment used: None Transfers: Sit to/from Stand Sit to Stand: Supervision         General transfer comment: for balance and safety    Balance Overall balance assessment: Mild deficits observed, not formally tested                                         ADL  either performed or assessed with clinical judgement   ADL Overall ADL's : Needs assistance/impaired Eating/Feeding: Modified independent;Sitting   Grooming: Supervision/safety;Standing   Upper Body Bathing: Supervision/ safety;Sitting;Standing   Lower Body Bathing: Supervison/ safety;Sit to/from stand   Upper Body Dressing : Modified independent;Sitting;Standing Upper Body Dressing Details (indicate cue type and reason): donning overhead shirt  Lower Body Dressing: Supervision/safety;Sit to/from stand Lower Body Dressing Details (indicate cue type and reason): pt utilizing figure 4 to don shorts Toilet Transfer: Supervision/safety;Ambulation Toilet Transfer Details (indicate cue type and reason): simulated via transfer to recliner        Tub/Shower Transfer Details (indicate cue type and reason): verbally reviewed safe transfer techniques. educated in benefits of using built-in seat for added safety and ease of reaching LEs during bathing task Functional mobility during ADLs: Supervision/safety       Vision         Perception     Praxis      Pertinent Vitals/Pain Pain Assessment: Faces Faces Pain Scale: Hurts little more Pain Location: generalized discomfort at incision site  Pain Descriptors / Indicators: Operative site guarding;Sore Pain Intervention(s): Limited activity within patient's tolerance;Monitored during session;Repositioned     Hand Dominance     Extremity/Trunk Assessment Upper Extremity Assessment Upper Extremity Assessment: Overall WFL for tasks assessed   Lower Extremity Assessment Lower Extremity Assessment: Defer to PT evaluation   Cervical / Trunk Assessment Cervical / Trunk Assessment: Other exceptions Cervical / Trunk Exceptions: s/p spinal  sx   Communication Communication Communication: No difficulties   Cognition Arousal/Alertness: Awake/alert Behavior During Therapy: WFL for tasks assessed/performed Overall Cognitive Status: Within  Functional Limits for tasks assessed                                     General Comments  incisional bandage clean and intact     Exercises     Shoulder Instructions      Home Living Family/patient expects to be discharged to:: Private residence Living Arrangements: Spouse/significant other Available Help at Discharge: Family;Available 24 hours/day Type of Home: House Home Access: Stairs to enter [employer] of Steps: 2 Entrance Stairs-Rails: None Home Layout: One level     Bathroom Shower/Tub: Producer, television/film/video:  (comfort height)     Home Equipment: Shower seat - built in          Prior Functioning/Environment Level of Independence: Independent                 OT Problem List: Decreased strength;Decreased range of motion;Impaired balance (sitting and/or standing);Decreased knowledge of precautions;Pain;Decreased knowledge of use of DME or AE      OT Treatment/Interventions:      OT Goals(Current goals can be found in the care plan section) Acute Rehab OT Goals Patient Stated Goal: "home" OT Goal Formulation: With patient Time For Goal Achievement: 09/12/20 Potential to Achieve Goals: Good  OT Frequency:     Barriers to D/C:            Co-evaluation              AM-PAC OT "6 Clicks" Daily Activity     Outcome Measure Help from another person eating meals?: None Help from another person taking care of personal grooming?: A Little Help from another person toileting, which includes using toliet, bedpan, or urinal?: A Little Help from another person bathing (including washing, rinsing, drying)?: A Little Help from another person to put on and taking off regular upper body clothing?: A Little Help from another person to put on and taking off regular lower body clothing?: A Little 6 Click Score: 19   End of Session Nurse Communication: Mobility status  Activity Tolerance: Patient tolerated treatment  well Patient left: in chair;with call bell/phone within reach;Other (comment) (hand off to PT)  OT Visit Diagnosis: Other abnormalities of gait and mobility (R26.89);Pain Pain - part of body:  (back)                Time: 5465-6812 OT Time Calculation (min): 17 min Charges:  OT General Charges $OT Visit: 1 Visit OT Evaluation $OT Eval Low Complexity: 1 Low  Marcy Siren, OT Acute Rehabilitation Services Pager [phone] Office [phone]   Orlando Penner 08/29/2020, 9:58 AM

## 2020-08-29 NOTE — Evaluation (Signed)
Physical Therapy Evaluation & Discharge Patient Details Name: Duane Greene MRN: [id number] DOB: [birth year] Today's Date: 08/29/2020   History of Present Illness  Pt is a 69 y/o male now s/p lumbar laminectomy L1-S1 and non-instrumented fusion L4-5. PMHx includes R inguinal hernia, sleep apnea, previous cholecystectomy.   Clinical Impression  Patient seen for gait, mobility and education on progression of activity as well as mobility with back precautions.  He was able to perform transfers, ambulation, stair negotiation, and bed mobility with precautions.  Feel he will be able to follow while at home and verbalized understanding of ambulation for exercise.  Feel after healing from surgery may benefit from outpatient PT for further LE strength/flexibilit, core strength and gait.  PT to sign off.     Follow Up Recommendations No PT follow up    Equipment Recommendations  None recommended by PT    Recommendations for Other Services       Precautions / Restrictions Precautions Precautions: Fall;Back Precaution Booklet Issued: Yes (comment) Precaution Comments: PT providing handout, reviewed precautions during session Required Braces or Orthoses:  ("no brace needed") Restrictions Weight Bearing Restrictions: No      Mobility  Bed Mobility Overal bed mobility: Modified Independent             General bed mobility comments: reviewed technique for precautions and pt performed without assist  Transfers Overall transfer level: Modified independent Equipment used: None Transfers: Sit to/from Stand Sit to Stand: Modified independent (Device/Increase time)         General transfer comment: for balance and safety  Ambulation/Gait Ambulation/Gait assistance: Min guard;Supervision Gait Distance (Feet): 220 Feet Assistive device: None Gait Pattern/deviations: Step-to pattern;Step-through pattern;Decreased stride length;Trunk flexed;Decreased weight shift to left     General  Gait Details: reports favoring L due to pain after distant ambulation, minguard at times and pt reaching for wall rail due to fatigue  Stairs Stairs: Yes Stairs assistance: Min guard Stair Management: One rail Right;Step to pattern;Forwards Number of Stairs: 3 General stair comments: assist for balance and controlled descent  Wheelchair Mobility    Modified Rankin (Stroke Patients Only)       Balance Overall balance assessment: Needs assistance   Sitting balance-Leahy Scale: Good     Standing balance support: No upper extremity supported Standing balance-Leahy Scale: Good Standing balance comment: able to walk without UE support, but needing wall rail at times                             Pertinent Vitals/Pain Pain Assessment: Faces Faces Pain Scale: Hurts little more Pain Location: generalized discomfort at incision site  Pain Descriptors / Indicators: Operative site guarding;Sore Pain Intervention(s): Monitored during session    Home Living Family/patient expects to be discharged to:: Private residence Living Arrangements: Spouse/significant other Available Help at Discharge: Family;Available 24 hours/day Type of Home: House Home Access: Stairs to enter Entrance Stairs-Rails: None Entrance Stairs-Number of Steps: 2 Home Layout: One level Home Equipment: Shower seat - built in      Prior Function Level of Independence: Independent               Hand Dominance        Extremity/Trunk Assessment   Upper Extremity Assessment Upper Extremity Assessment: Overall WFL for tasks assessed    Lower Extremity Assessment Lower Extremity Assessment: Generalized weakness    Cervical / Trunk Assessment Cervical / Trunk Assessment: Other exceptions Cervical / Trunk  Exceptions: s/p spinal sx  Communication   Communication: No difficulties  Cognition Arousal/Alertness: Awake/alert Behavior During Therapy: WFL for tasks assessed/performed Overall  Cognitive Status: Within Functional Limits for tasks assessed                                        General Comments General comments (skin integrity, edema, etc.): Educated on precautions with handout, discussed progression of activities and scooting back in recliner as well as working on bed mobility    Exercises     Assessment/Plan    PT Assessment Patent does not need any further PT services  PT Problem List         PT Treatment Interventions      PT Goals (Current goals can be found in the Care Plan section)  Acute Rehab PT Goals Patient Stated Goal: "home" PT Goal Formulation: All assessment and education complete, DC therapy    Frequency     Barriers to discharge        Co-evaluation               AM-PAC PT "6 Clicks" Mobility  Outcome Measure Help needed turning from your back to your side while in a flat bed without using bedrails?: A Little Help needed moving from lying on your back to sitting on the side of a flat bed without using bedrails?: None Help needed moving to and from a bed to a chair (including a wheelchair)?: None Help needed standing up from a chair using your arms (e.g., wheelchair or bedside chair)?: None Help needed to walk in hospital room?: None Help needed climbing 3-5 steps with a railing? : A Little 6 Click Score: 22    End of Session Equipment Utilized During Treatment: Back brace Activity Tolerance: Patient tolerated treatment well Patient left: in chair;with call bell/phone within reach Nurse Communication: Mobility status PT Visit Diagnosis: Other abnormalities of gait and mobility (R26.89)    Time: 2725-3664 PT Time Calculation (min) (ACUTE ONLY): 32 min   Charges:   PT Evaluation $PT Eval Low Complexity: 1 Low PT Treatments $Self Care/Home Management: 8-22        Sheran Lawless, PT Acute Rehabilitation Services [phone] Office:[phone] 08/29/2020   Duane Greene 08/29/2020,  11:51 AM

## 2020-08-29 NOTE — Progress Notes (Signed)
Patient alert and oriented, mae's well, voiding adequate amount of urine, swallowing without difficulty, no c/o pain at time of discharge. Patient discharged home with family. Script and discharged instructions given to patient. RN educated patient and spouse on how to empty the Hemovac drain and document. Patient to go to MD office tomorrow for drainage removal. Patient and family stated understanding of instructions given. Patient has an appointment with Dr. Yetta Barre

## 2020-08-29 NOTE — Discharge Summary (Signed)
Physician Discharge Summary  Patient ID: Duane Greene MRN: [id number] DOB/AGE: [birth year] 69 y.o.  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 The patient tolerated the procedure well and was taken to the recovery room and then to the floor in stable condition. The hospital course was routine. There were no complications. The wound remained clean dry and intact. Pt had appropriate back soreness. No complaints of leg pain or new N/T/W. The patient remained afebrile with stable vital signs, and tolerated a regular diet. The patient continued to increase activities, and pain was well controlled with oral pain medications.   Consults: None  Significant Diagnostic Studies:  Results for orders placed or performed during the hospital encounter of 08/28/20  Type and screen [facility]  Result Value Ref Range   ABO/RH(D) O POS    Antibody Screen NEG    Sample Expiration      08/31/2020,2359 Performed at [facility], [address], [place], [place] [address]   ABO/Rh  Result Value Ref Range   ABO/RH(D)      O POS Performed at [facility], [address] [place], [place] [address]     Chest 2 View  Result Date: 08/24/2020 CLINICAL DATA:  Preop evaluation for upcoming lumbar surgery EXAM: CHEST - 2 VIEW COMPARISON:  02/21/2007 FINDINGS: Cardiac shadow is within normal limits. Lungs are well aerated bilaterally. No focal infiltrate or effusion is seen. Degenerative changes of the thoracic spine are noted. IMPRESSION: No active cardiopulmonary disease. Electronically Signed   By: Alcide Clever M.D.   On: 08/24/2020 22:57   DG Lumbar Spine 1  View  Result Date: 08/28/2020 CLINICAL DATA:  Localization film EXAM: LUMBAR SPINE - 1 VIEW COMPARISON:  MR 07/15/2020 FINDINGS: Pointed surgical implement projects over the posterior elements of S1. Additional surgical implements project over the posterior elements posterior to the L3-4 and L4-5 levels. Radiodense sponge marker noted in the posterior soft tissues as well. Multilevel degenerative changes in the spine, similar to comparison. Atherosclerotic calcification of the aorta. Soft tissues are unremarkable. IMPRESSION: 1. Localization implement projects over the posterior elements of S1. 2. Additional surgical implements project over the posterior elements posterior to the L3-4 and L4-5 levels. 3. Sponge marker in the posterior soft tissues. Electronically Signed   By: Kreg Shropshire M.D.   On: 08/28/2020 19:32    Antibiotics:  Anti-infectives (From admission, onward)   Start     Dose/Rate Route Frequency Ordered Stop   08/28/20 2030  ceFAZolin (ANCEF) IVPB 2g/100 mL premix        2 g 200 mL/hr over 30 Minutes Intravenous Every 8 hours 08/28/20 1823 08/29/20 0404   08/28/20 1045  ceFAZolin (ANCEF) IVPB 2g/100 mL premix        2 g 200 mL/hr over 30 Minutes Intravenous On call to O.R. 08/28/20 1030 08/28/20 1235      Discharge Exam: Blood pressure (!) 129/55, pulse 64, temperature 98.6 F (37 C), temperature source Oral, resp. rate 17, height 5\' 10"  (1.778 m), weight 95.8 kg, SpO2 100 %. Neurologic: Grossly normal drsssing dry  Discharge Medications:   Allergies as of 08/29/2020   No Known Allergies     Medication List  STOP taking these medications   HYDROcodone-acetaminophen 5-325 MG tablet Commonly known as: NORCO/VICODIN     TAKE these medications   aspirin 81 MG EC tablet Take 81 mg by mouth at bedtime.   celecoxib 200 MG capsule Commonly known as: CELEBREX Take 1 capsule (200 mg total) by mouth every 12 (twelve) hours.   methocarbamol 500 MG tablet Commonly  known as: ROBAXIN Take 1 tablet (500 mg total) by mouth every 6 (six) hours as needed for muscle spasms.   multivitamin with minerals tablet Take 1 tablet by mouth at bedtime.   oxyCODONE 5 MG immediate release tablet Commonly known as: Oxy IR/ROXICODONE Take 1 tablet (5 mg total) by mouth every 4 (four) hours as needed for severe pain ((score 7 to 10)).   simvastatin 80 MG tablet Commonly known as: ZOCOR Take 40 mg by mouth at bedtime.   terbinafine 250 MG tablet Commonly known as: LAMISIL Take 250 mg by mouth every Sunday.       Disposition: home   Final Dx: DLL L1-S1 fusion L4-5  Discharge Instructions     Remove dressing in 72 hours   Complete by: As directed    Call MD for:  persistant nausea and vomiting   Complete by: As directed    Call MD for:  redness, tenderness, or signs of infection (pain, swelling, redness, odor or green/yellow discharge around incision site)   Complete by: As directed    Call MD for:  severe uncontrolled pain   Complete by: As directed    Call MD for:  temperature >100.4   Complete by: As directed    Diet - low sodium heart healthy   Complete by: As directed    Increase activity slowly   Complete by: As directed          Signed: Tia Alert 08/29/2020, 8:32 AM

## 2020-09-12 ENCOUNTER — Other Ambulatory Visit (HOSPITAL_COMMUNITY): Payer: Self-pay | Admitting: Internal Medicine

## 2020-09-12 MED FILL — TERBINAFINE HCL 250 MG TAB: 250 | 84 days supply | Qty: 12 | Fill #0

## 2020-09-18 DIAGNOSIS — E559 Vitamin D deficiency, unspecified: Secondary | ICD-10-CM | POA: Diagnosis not present

## 2020-09-18 DIAGNOSIS — M5416 Radiculopathy, lumbar region: Secondary | ICD-10-CM | POA: Diagnosis not present

## 2020-09-18 DIAGNOSIS — E782 Mixed hyperlipidemia: Secondary | ICD-10-CM | POA: Diagnosis not present

## 2020-09-18 DIAGNOSIS — Z23 Encounter for immunization: Secondary | ICD-10-CM | POA: Diagnosis not present

## 2020-09-18 DIAGNOSIS — Z1211 Encounter for screening for malignant neoplasm of colon: Secondary | ICD-10-CM | POA: Diagnosis not present

## 2020-09-18 DIAGNOSIS — E669 Obesity, unspecified: Secondary | ICD-10-CM | POA: Diagnosis not present

## 2020-09-18 DIAGNOSIS — Z125 Encounter for screening for malignant neoplasm of prostate: Secondary | ICD-10-CM | POA: Diagnosis not present

## 2020-09-18 DIAGNOSIS — Z79899 Other long term (current) drug therapy: Secondary | ICD-10-CM | POA: Diagnosis not present

## 2020-09-18 DIAGNOSIS — I10 Essential (primary) hypertension: Secondary | ICD-10-CM | POA: Diagnosis not present

## 2020-09-18 DIAGNOSIS — G4733 Obstructive sleep apnea (adult) (pediatric): Secondary | ICD-10-CM | POA: Diagnosis not present

## 2020-09-18 DIAGNOSIS — Z0001 Encounter for general adult medical examination with abnormal findings: Secondary | ICD-10-CM | POA: Diagnosis not present

## 2020-10-24 DIAGNOSIS — M48061 Spinal stenosis, lumbar region without neurogenic claudication: Secondary | ICD-10-CM | POA: Diagnosis not present

## 2020-10-26 ENCOUNTER — Other Ambulatory Visit (HOSPITAL_COMMUNITY): Payer: Self-pay | Admitting: Internal Medicine

## 2020-10-26 MED FILL — SIMVASTATIN 80 MG TABLET: 80 | 90 days supply | Qty: 90 | Fill #0

## 2020-12-06 ENCOUNTER — Other Ambulatory Visit (HOSPITAL_COMMUNITY): Payer: Self-pay | Admitting: Internal Medicine

## 2020-12-07 MED FILL — TERBINAFINE HCL 250 MG TAB: 250 | 84 days supply | Qty: 12 | Fill #0

## 2020-12-17 DIAGNOSIS — G4733 Obstructive sleep apnea (adult) (pediatric): Secondary | ICD-10-CM | POA: Diagnosis not present

## 2021-01-15 DIAGNOSIS — G4733 Obstructive sleep apnea (adult) (pediatric): Secondary | ICD-10-CM | POA: Diagnosis not present

## 2021-02-14 DIAGNOSIS — G4733 Obstructive sleep apnea (adult) (pediatric): Secondary | ICD-10-CM | POA: Diagnosis not present

## 2021-03-01 ENCOUNTER — Other Ambulatory Visit (HOSPITAL_COMMUNITY): Payer: Self-pay

## 2021-03-02 ENCOUNTER — Other Ambulatory Visit (HOSPITAL_COMMUNITY): Payer: Self-pay

## 2021-03-02 MED FILL — Simvastatin Tab 80 MG: ORAL | 90 days supply | Qty: 90 | Fill #0 | Status: AC

## 2021-03-09 ENCOUNTER — Other Ambulatory Visit (HOSPITAL_COMMUNITY): Payer: Self-pay

## 2021-03-09 MED ORDER — TERBINAFINE HCL 250 MG PO TABS
250.0000 mg | ORAL_TABLET | ORAL | 2 refills | Status: DC
  Filled 2021-03-09 – 2021-05-28 (×2): qty 12, 84d supply, fill #0
  Filled 2021-08-15: qty 12, 84d supply, fill #1

## 2021-03-12 ENCOUNTER — Other Ambulatory Visit (HOSPITAL_COMMUNITY): Payer: Self-pay

## 2021-03-17 DIAGNOSIS — G4733 Obstructive sleep apnea (adult) (pediatric): Secondary | ICD-10-CM | POA: Diagnosis not present

## 2021-04-16 DIAGNOSIS — G4733 Obstructive sleep apnea (adult) (pediatric): Secondary | ICD-10-CM | POA: Diagnosis not present

## 2021-04-30 ENCOUNTER — Other Ambulatory Visit (HOSPITAL_BASED_OUTPATIENT_CLINIC_OR_DEPARTMENT_OTHER): Payer: Self-pay

## 2021-05-08 DIAGNOSIS — H5340 Unspecified visual field defects: Secondary | ICD-10-CM | POA: Diagnosis not present

## 2021-05-08 DIAGNOSIS — H524 Presbyopia: Secondary | ICD-10-CM | POA: Diagnosis not present

## 2021-05-08 DIAGNOSIS — H2513 Age-related nuclear cataract, bilateral: Secondary | ICD-10-CM | POA: Diagnosis not present

## 2021-05-08 DIAGNOSIS — H4720 Unspecified optic atrophy: Secondary | ICD-10-CM | POA: Diagnosis not present

## 2021-05-17 DIAGNOSIS — G4733 Obstructive sleep apnea (adult) (pediatric): Secondary | ICD-10-CM | POA: Diagnosis not present

## 2021-05-28 ENCOUNTER — Other Ambulatory Visit (HOSPITAL_BASED_OUTPATIENT_CLINIC_OR_DEPARTMENT_OTHER): Payer: Self-pay

## 2021-07-17 DIAGNOSIS — G4733 Obstructive sleep apnea (adult) (pediatric): Secondary | ICD-10-CM | POA: Diagnosis not present

## 2021-07-24 IMAGING — MR MR LUMBAR SPINE W/O CM
4 of 5 series · 24 of 48 positions shown · non-contrast
Comparison: None.

CLINICAL DATA: Bilateral leg weakness.  Lumbar radiculopathy.

EXAM:
MRI LUMBAR SPINE WITHOUT CONTRAST
TECHNIQUE: Multiplanar, multisequence MR imaging of the lumbar spine was
performed. No intravenous contrast was administered.

[Series 3: T2 post-contrast · sagittal · 4.0mm · 0.53mm/px · 6 of 18 slices shown]
[im 1/18]
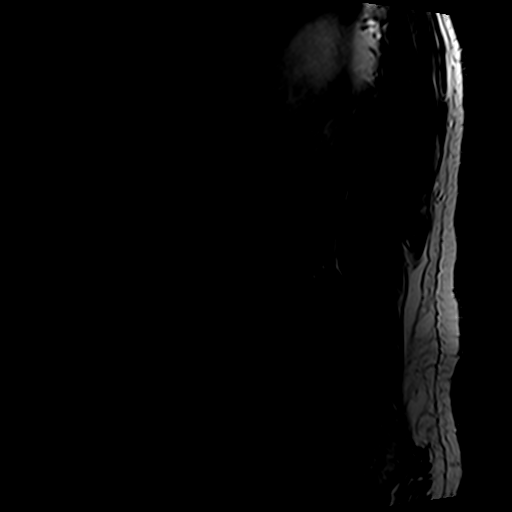
[im 4/18]
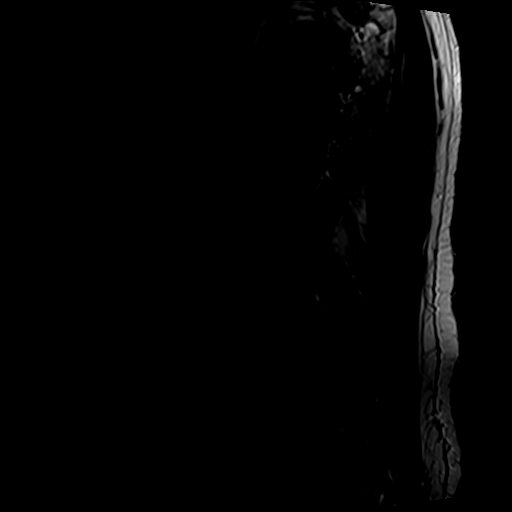
[im 7/18]
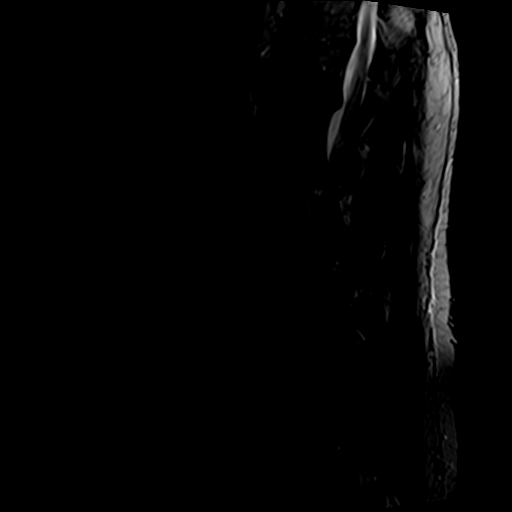
[im 11/18]
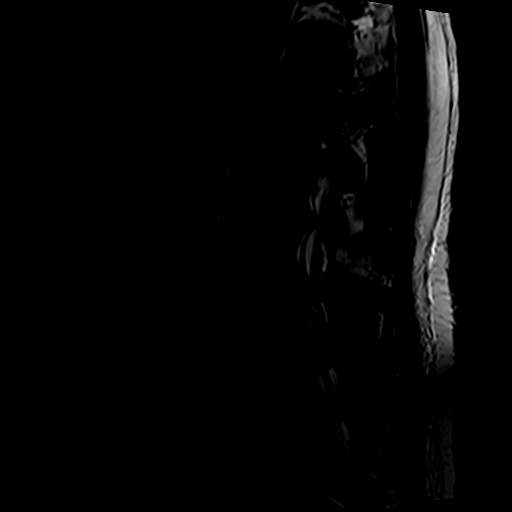
[im 14/18]
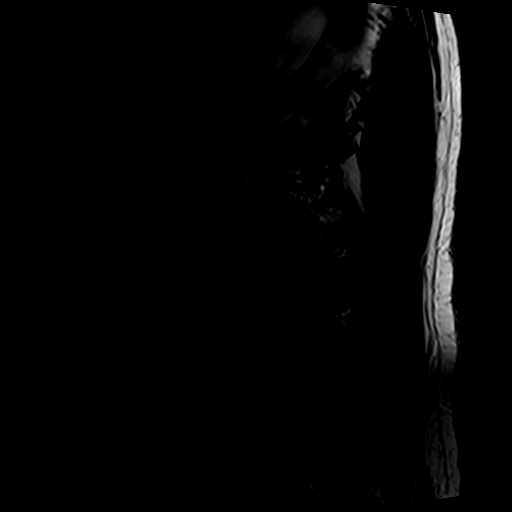
[im 18/18]
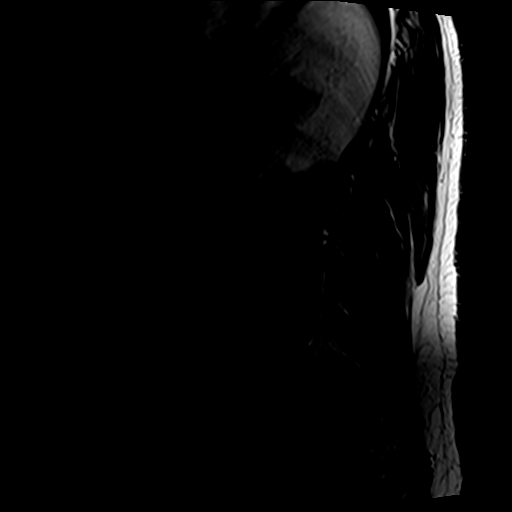

[Series 5: T1 · sagittal · 4.0mm · 0.53mm/px · 7 of 18 slices shown (1 of 2)]
[im 1/18]
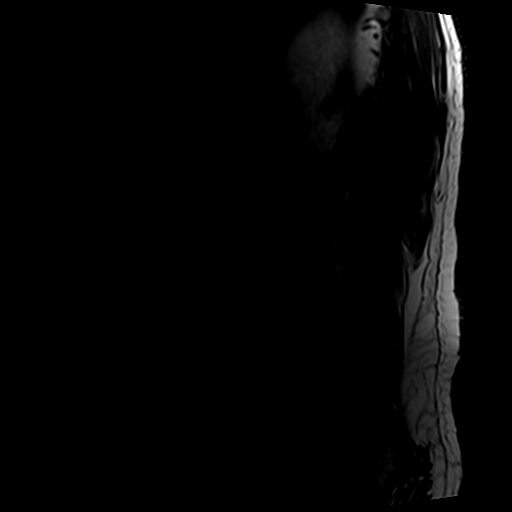
[im 3/18]
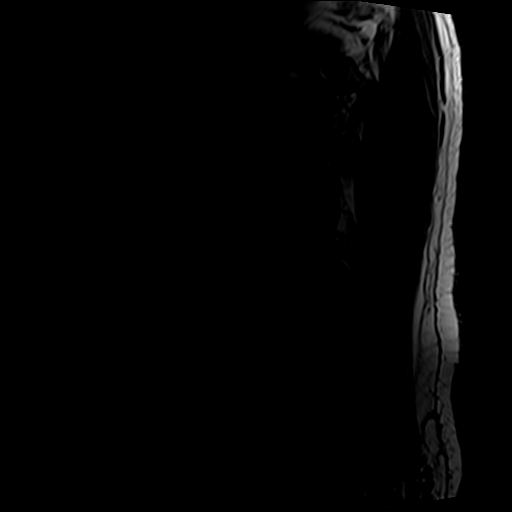
[im 6/18]
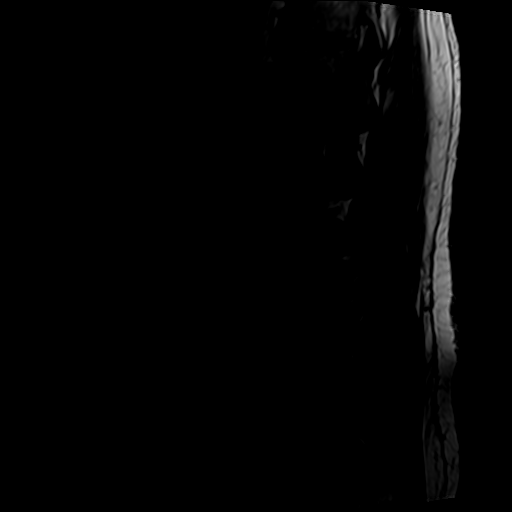
[im 9/18]
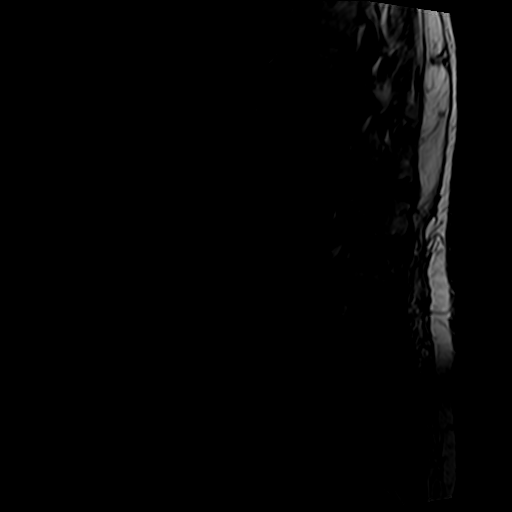
[im 12/18]
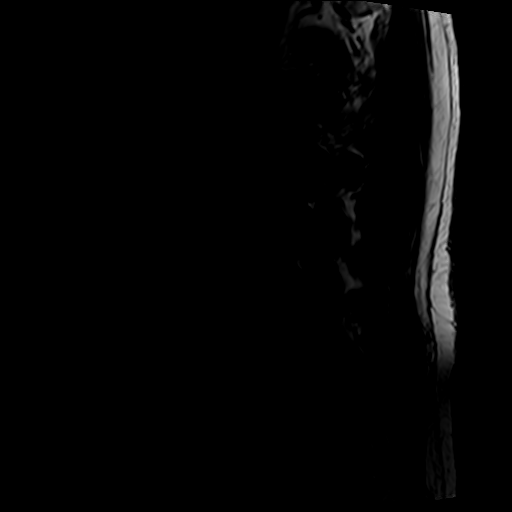
[im 15/18]
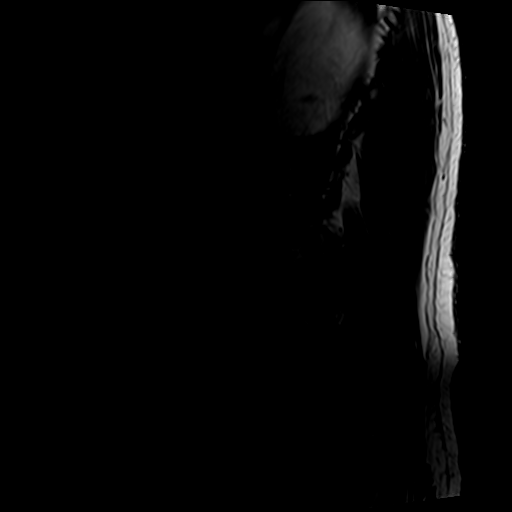
[im 18/18]
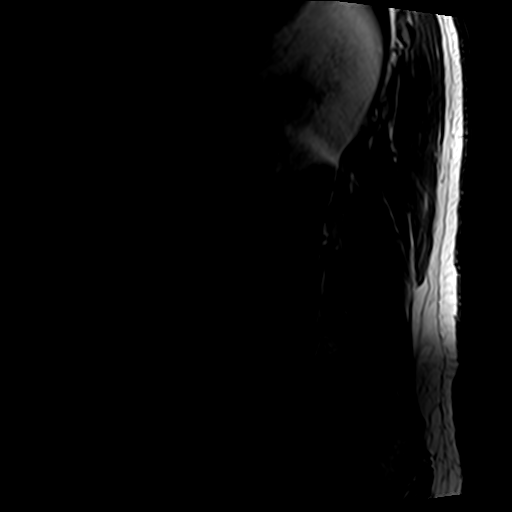

[Series 6: T2 · axial · 4.0mm · 0.70mm/px · z∈[-40,+174]mm · 8 of 39 slices shown]
[im 1/39]
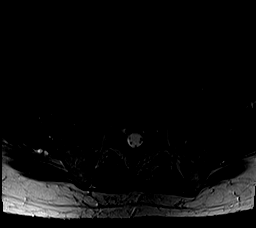
[im 6/39]
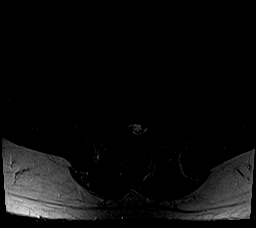
[im 12/39]
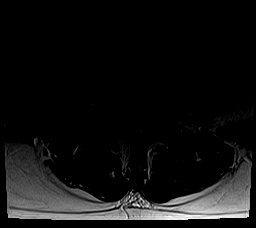
[im 18/39]
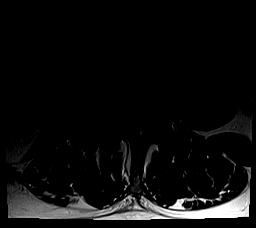
[im 21/39]
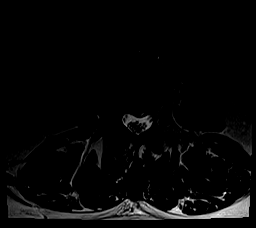
[im 27/39]
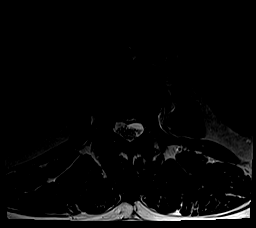
[im 33/39]
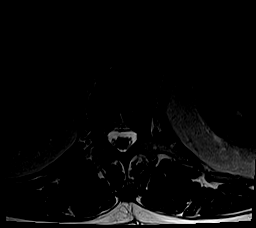
[im 39/39]
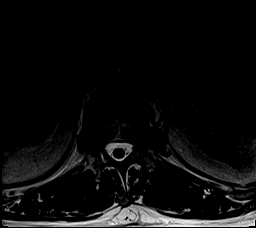

[Series 7: T1 · axial · 4.0mm · 0.35mm/px · z∈[-15,+143]mm · 3 of 39 slices shown (2 of 2)]
[im 6/39]
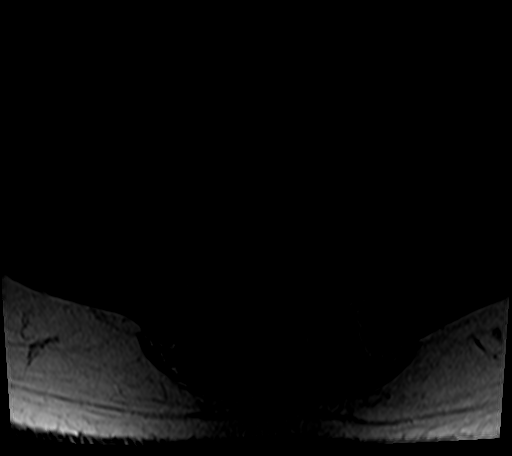
[im 21/39]
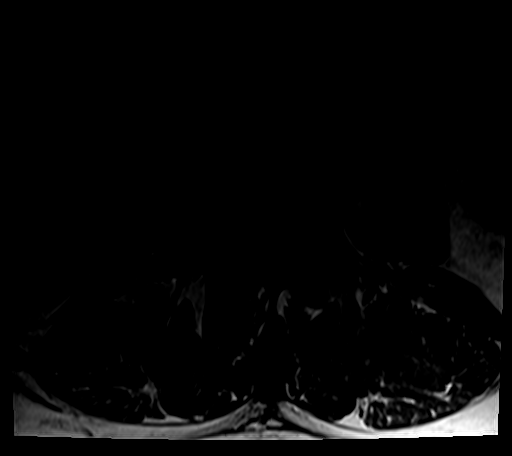
[im 33/39]
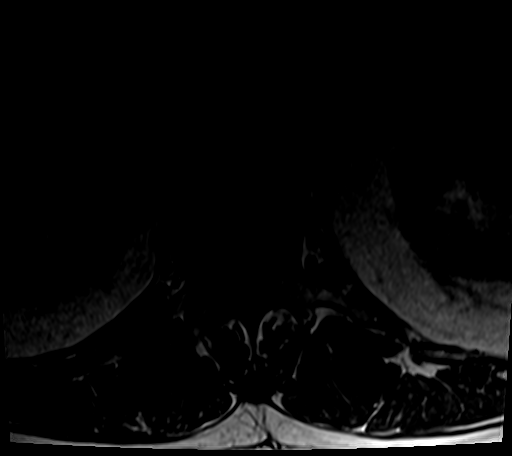

[24 of 48 positions shown; findings below may reference images not displayed]

FINDINGS: Segmentation:

Alignment: Mild retrolisthesis L1-2, L2-3. Mild anterolisthesis L4-5
and mild retrolisthesis L5-S1. Mild scoliosis.

Vertebrae:  Normal bone marrow.  Negative for fracture or mass.

Conus medullaris and cauda equina: Conus extends to the L2 level.
Conus and cauda equina appear normal.

Paraspinal and other soft tissues: Negative for paraspinous mass or
adenopathy. No fluid collection identified

Disc levels:

T12-L1: Diffuse bulging of the disc with broad-based central disc
protrusion. Small left foraminal disc protrusion with left foraminal
stenosis. Mild narrowing of the spinal canal.

L1-2: Disc degeneration with diffuse endplate spurring. Bilateral
facet and ligamentum flavum hypertrophy. Moderate spinal stenosis
and moderate subarticular and foraminal stenosis bilaterally.

L2-3: Disc degeneration with left paracentral disc protrusion and
diffuse disc bulging. Bilateral facet hypertrophy. Moderate spinal
stenosis. Moderate subarticular stenosis bilaterally left greater
than right.

L3-4: Diffuse disc bulging with endplate spurring. Severe facet and
ligamentum flavum hypertrophy. Moderate to severe spinal stenosis.
Small extraforaminal disc protrusion on the right. Severe
subarticular and foraminal stenosis on the right. Moderate
subarticular and foraminal stenosis on the left.

L4-5: Central disc protrusion. Diffuse disc bulging with endplate
spurring and moderate facet hypertrophy. Severe spinal stenosis.
Severe subarticular and foraminal stenosis bilaterally

L5-S1: Disc degeneration with diffuse endplate spurring. Left
foraminal disc protrusion. Severe subarticular and foraminal
stenosis bilaterally, left greater than right. Mild spinal stenosis.
IMPRESSION: Severe degenerative change throughout the lumbar spine as above.
Multilevel spinal and foraminal stenosis

Moderate spinal stenosis L1-2, L2-3, L3-4.

Severe spinal stenosis L4-5 with severe subarticular and foraminal
stenosis bilaterally. Severe spinal stenosis L5-S1 with severe
subarticular and foraminal stenosis bilaterally.

## 2021-08-15 ENCOUNTER — Other Ambulatory Visit (HOSPITAL_BASED_OUTPATIENT_CLINIC_OR_DEPARTMENT_OTHER): Payer: Self-pay

## 2021-08-17 DIAGNOSIS — G4733 Obstructive sleep apnea (adult) (pediatric): Secondary | ICD-10-CM | POA: Diagnosis not present

## 2021-09-06 DIAGNOSIS — M25511 Pain in right shoulder: Secondary | ICD-10-CM | POA: Diagnosis not present

## 2021-09-20 ENCOUNTER — Other Ambulatory Visit (HOSPITAL_BASED_OUTPATIENT_CLINIC_OR_DEPARTMENT_OTHER): Payer: Self-pay

## 2021-09-20 DIAGNOSIS — Z1389 Encounter for screening for other disorder: Secondary | ICD-10-CM | POA: Diagnosis not present

## 2021-09-20 DIAGNOSIS — M5416 Radiculopathy, lumbar region: Secondary | ICD-10-CM | POA: Diagnosis not present

## 2021-09-20 DIAGNOSIS — G4733 Obstructive sleep apnea (adult) (pediatric): Secondary | ICD-10-CM | POA: Diagnosis not present

## 2021-09-20 DIAGNOSIS — E669 Obesity, unspecified: Secondary | ICD-10-CM | POA: Diagnosis not present

## 2021-09-20 DIAGNOSIS — Z7189 Other specified counseling: Secondary | ICD-10-CM | POA: Diagnosis not present

## 2021-09-20 DIAGNOSIS — Z23 Encounter for immunization: Secondary | ICD-10-CM | POA: Diagnosis not present

## 2021-09-20 DIAGNOSIS — Z1211 Encounter for screening for malignant neoplasm of colon: Secondary | ICD-10-CM | POA: Diagnosis not present

## 2021-09-20 DIAGNOSIS — I10 Essential (primary) hypertension: Secondary | ICD-10-CM | POA: Diagnosis not present

## 2021-09-20 DIAGNOSIS — Z79899 Other long term (current) drug therapy: Secondary | ICD-10-CM | POA: Diagnosis not present

## 2021-09-20 DIAGNOSIS — Z0000 Encounter for general adult medical examination without abnormal findings: Secondary | ICD-10-CM | POA: Diagnosis not present

## 2021-09-20 DIAGNOSIS — E559 Vitamin D deficiency, unspecified: Secondary | ICD-10-CM | POA: Diagnosis not present

## 2021-09-20 DIAGNOSIS — Z0001 Encounter for general adult medical examination with abnormal findings: Secondary | ICD-10-CM | POA: Diagnosis not present

## 2021-09-20 DIAGNOSIS — Z125 Encounter for screening for malignant neoplasm of prostate: Secondary | ICD-10-CM | POA: Diagnosis not present

## 2021-09-20 DIAGNOSIS — E782 Mixed hyperlipidemia: Secondary | ICD-10-CM | POA: Diagnosis not present

## 2021-09-20 DIAGNOSIS — H4720 Unspecified optic atrophy: Secondary | ICD-10-CM | POA: Diagnosis not present

## 2021-09-20 MED ORDER — LOSARTAN POTASSIUM-HCTZ 50-12.5 MG PO TABS
ORAL_TABLET | ORAL | 3 refills | Status: DC
  Filled 2021-09-20: qty 90, 90d supply, fill #0
  Filled 2021-12-26: qty 90, 90d supply, fill #1
  Filled 2022-04-08: qty 90, 90d supply, fill #2
  Filled 2022-07-08: qty 90, 90d supply, fill #3

## 2021-09-28 ENCOUNTER — Other Ambulatory Visit (HOSPITAL_BASED_OUTPATIENT_CLINIC_OR_DEPARTMENT_OTHER): Payer: Self-pay

## 2021-09-28 ENCOUNTER — Other Ambulatory Visit: Payer: Self-pay

## 2021-09-28 ENCOUNTER — Ambulatory Visit: Payer: PPO | Attending: Internal Medicine

## 2021-09-28 DIAGNOSIS — Z23 Encounter for immunization: Secondary | ICD-10-CM

## 2021-09-28 MED ORDER — PFIZER COVID-19 VAC BIVALENT 30 MCG/0.3ML IM SUSP
INTRAMUSCULAR | 0 refills | Status: DC
Start: 2021-09-28 — End: 2023-02-03
  Filled 2021-09-28: qty 0.3, 1d supply, fill #0

## 2021-09-28 MED ORDER — TETANUS-DIPHTH-ACELL PERTUSSIS 5-2.5-18.5 LF-MCG/0.5 IM SUSY
PREFILLED_SYRINGE | INTRAMUSCULAR | 0 refills | Status: DC
  Filled 2021-09-28: qty 0.5, 1d supply, fill #0

## 2021-09-28 NOTE — Progress Notes (Signed)
   Covid-19 Vaccination Clinic  Name:  Duane Greene    MRN: [id number] DOB: 1951-05-05  09/28/2021  Duane Greene was observed post Covid-19 immunization for 15 minutes without incident. He was provided with Vaccine Information Sheet and instruction to access the V-Safe system.   Duane Greene was instructed to call 911 with any severe reactions post vaccine: Difficulty breathing  Swelling of face and throat  A fast heartbeat  A bad rash all over body  Dizziness and weakness   Immunizations Administered     Name Date Dose VIS Date Route   Pfizer Covid-19 Vaccine Bivalent Booster 09/28/2021  2:14 PM 0.3 mL 07/18/2021 Intramuscular   Manufacturer: [employer], [employer]   Lot: [id number]   NDC: [phone]

## 2021-10-02 ENCOUNTER — Other Ambulatory Visit (HOSPITAL_COMMUNITY): Payer: Self-pay

## 2021-10-02 MED ORDER — DOXYCYCLINE HYCLATE 100 MG PO CAPS
100.0000 mg | ORAL_CAPSULE | Freq: Every day | ORAL | 0 refills | Status: DC
  Filled 2021-10-02 – 2021-10-03 (×2): qty 10, 10d supply, fill #0

## 2021-10-03 ENCOUNTER — Other Ambulatory Visit (HOSPITAL_COMMUNITY): Payer: Self-pay

## 2021-10-03 ENCOUNTER — Other Ambulatory Visit (HOSPITAL_BASED_OUTPATIENT_CLINIC_OR_DEPARTMENT_OTHER): Payer: Self-pay

## 2021-10-05 DIAGNOSIS — I10 Essential (primary) hypertension: Secondary | ICD-10-CM | POA: Diagnosis not present

## 2021-10-05 DIAGNOSIS — R972 Elevated prostate specific antigen [PSA]: Secondary | ICD-10-CM | POA: Diagnosis not present

## 2021-10-17 DIAGNOSIS — G4733 Obstructive sleep apnea (adult) (pediatric): Secondary | ICD-10-CM | POA: Diagnosis not present

## 2021-10-23 DIAGNOSIS — R972 Elevated prostate specific antigen [PSA]: Secondary | ICD-10-CM | POA: Diagnosis not present

## 2021-11-16 DIAGNOSIS — G4733 Obstructive sleep apnea (adult) (pediatric): Secondary | ICD-10-CM | POA: Diagnosis not present

## 2021-11-22 ENCOUNTER — Other Ambulatory Visit (HOSPITAL_BASED_OUTPATIENT_CLINIC_OR_DEPARTMENT_OTHER): Payer: Self-pay

## 2021-11-22 DIAGNOSIS — M19011 Primary osteoarthritis, right shoulder: Secondary | ICD-10-CM | POA: Diagnosis not present

## 2021-11-22 DIAGNOSIS — M25511 Pain in right shoulder: Secondary | ICD-10-CM | POA: Diagnosis not present

## 2021-11-22 MED ORDER — PREDNISONE 10 MG PO TABS
ORAL_TABLET | ORAL | 0 refills | Status: DC
  Filled 2021-11-22: qty 20, 8d supply, fill #0

## 2021-11-28 ENCOUNTER — Other Ambulatory Visit (HOSPITAL_BASED_OUTPATIENT_CLINIC_OR_DEPARTMENT_OTHER): Payer: Self-pay

## 2021-11-28 MED ORDER — SIMVASTATIN 80 MG PO TABS
ORAL_TABLET | ORAL | 3 refills | Status: DC
  Filled 2021-11-28: qty 90, 90d supply, fill #0
  Filled 2022-07-08: qty 90, 90d supply, fill #1

## 2021-11-28 MED ORDER — TERBINAFINE HCL 250 MG PO TABS
ORAL_TABLET | ORAL | 2 refills | Status: DC
  Filled 2021-11-28: qty 12, 84d supply, fill #0
  Filled 2022-02-11: qty 12, 84d supply, fill #1
  Filled 2022-04-29: qty 12, 84d supply, fill #2

## 2021-11-29 ENCOUNTER — Other Ambulatory Visit (HOSPITAL_BASED_OUTPATIENT_CLINIC_OR_DEPARTMENT_OTHER): Payer: Self-pay

## 2021-12-03 DIAGNOSIS — M19011 Primary osteoarthritis, right shoulder: Secondary | ICD-10-CM | POA: Diagnosis not present

## 2021-12-03 DIAGNOSIS — M25511 Pain in right shoulder: Secondary | ICD-10-CM | POA: Diagnosis not present

## 2021-12-27 ENCOUNTER — Other Ambulatory Visit (HOSPITAL_BASED_OUTPATIENT_CLINIC_OR_DEPARTMENT_OTHER): Payer: Self-pay

## 2022-02-04 DIAGNOSIS — M1712 Unilateral primary osteoarthritis, left knee: Secondary | ICD-10-CM | POA: Diagnosis not present

## 2022-02-04 DIAGNOSIS — M25562 Pain in left knee: Secondary | ICD-10-CM | POA: Diagnosis not present

## 2022-02-04 DIAGNOSIS — M1711 Unilateral primary osteoarthritis, right knee: Secondary | ICD-10-CM | POA: Diagnosis not present

## 2022-02-04 DIAGNOSIS — M170 Bilateral primary osteoarthritis of knee: Secondary | ICD-10-CM | POA: Diagnosis not present

## 2022-02-11 ENCOUNTER — Other Ambulatory Visit (HOSPITAL_BASED_OUTPATIENT_CLINIC_OR_DEPARTMENT_OTHER): Payer: Self-pay

## 2022-02-14 DIAGNOSIS — M25562 Pain in left knee: Secondary | ICD-10-CM | POA: Diagnosis not present

## 2022-02-20 DIAGNOSIS — M25562 Pain in left knee: Secondary | ICD-10-CM | POA: Diagnosis not present

## 2022-02-27 DIAGNOSIS — M25562 Pain in left knee: Secondary | ICD-10-CM | POA: Diagnosis not present

## 2022-02-27 DIAGNOSIS — M1712 Unilateral primary osteoarthritis, left knee: Secondary | ICD-10-CM | POA: Diagnosis not present

## 2022-02-28 DIAGNOSIS — M25562 Pain in left knee: Secondary | ICD-10-CM | POA: Diagnosis not present

## 2022-03-06 DIAGNOSIS — M25562 Pain in left knee: Secondary | ICD-10-CM | POA: Diagnosis not present

## 2022-03-06 DIAGNOSIS — M1712 Unilateral primary osteoarthritis, left knee: Secondary | ICD-10-CM | POA: Diagnosis not present

## 2022-03-08 DIAGNOSIS — M25562 Pain in left knee: Secondary | ICD-10-CM | POA: Diagnosis not present

## 2022-03-13 DIAGNOSIS — M1712 Unilateral primary osteoarthritis, left knee: Secondary | ICD-10-CM | POA: Diagnosis not present

## 2022-03-13 DIAGNOSIS — M25562 Pain in left knee: Secondary | ICD-10-CM | POA: Diagnosis not present

## 2022-03-15 DIAGNOSIS — M25562 Pain in left knee: Secondary | ICD-10-CM | POA: Diagnosis not present

## 2022-04-08 ENCOUNTER — Other Ambulatory Visit (HOSPITAL_BASED_OUTPATIENT_CLINIC_OR_DEPARTMENT_OTHER): Payer: Self-pay

## 2022-04-29 ENCOUNTER — Other Ambulatory Visit (HOSPITAL_BASED_OUTPATIENT_CLINIC_OR_DEPARTMENT_OTHER): Payer: Self-pay

## 2022-04-30 DIAGNOSIS — M19011 Primary osteoarthritis, right shoulder: Secondary | ICD-10-CM | POA: Diagnosis not present

## 2022-05-17 DIAGNOSIS — H4720 Unspecified optic atrophy: Secondary | ICD-10-CM | POA: Diagnosis not present

## 2022-05-17 DIAGNOSIS — H5203 Hypermetropia, bilateral: Secondary | ICD-10-CM | POA: Diagnosis not present

## 2022-05-17 DIAGNOSIS — H2513 Age-related nuclear cataract, bilateral: Secondary | ICD-10-CM | POA: Diagnosis not present

## 2022-06-12 ENCOUNTER — Other Ambulatory Visit (HOSPITAL_BASED_OUTPATIENT_CLINIC_OR_DEPARTMENT_OTHER): Payer: Self-pay

## 2022-07-08 ENCOUNTER — Other Ambulatory Visit (HOSPITAL_BASED_OUTPATIENT_CLINIC_OR_DEPARTMENT_OTHER): Payer: Self-pay

## 2022-07-11 DIAGNOSIS — M25511 Pain in right shoulder: Secondary | ICD-10-CM | POA: Diagnosis not present

## 2022-07-14 DIAGNOSIS — M25511 Pain in right shoulder: Secondary | ICD-10-CM | POA: Diagnosis not present

## 2022-08-06 DIAGNOSIS — G629 Polyneuropathy, unspecified: Secondary | ICD-10-CM | POA: Diagnosis not present

## 2022-08-06 DIAGNOSIS — I10 Essential (primary) hypertension: Secondary | ICD-10-CM | POA: Diagnosis not present

## 2022-08-06 DIAGNOSIS — R739 Hyperglycemia, unspecified: Secondary | ICD-10-CM | POA: Diagnosis not present

## 2022-08-07 ENCOUNTER — Other Ambulatory Visit (HOSPITAL_BASED_OUTPATIENT_CLINIC_OR_DEPARTMENT_OTHER): Payer: Self-pay

## 2022-08-07 MED ORDER — TERBINAFINE HCL 250 MG PO TABS
ORAL_TABLET | ORAL | 2 refills | Status: DC
  Filled 2022-08-07: qty 12, 84d supply, fill #0
  Filled 2022-11-19: qty 12, 84d supply, fill #1
  Filled 2023-02-05: qty 12, 84d supply, fill #2

## 2022-08-26 NOTE — Progress Notes (Signed)
Surgery orders requested via Epic inbox. °

## 2022-08-27 NOTE — H&P (Signed)
Patient's anticipated LOS is less than 2 midnights, meeting these requirements: - Younger than 37 - Lives within 1 hour of care - Has a competent adult at home to recover with post-op recover - NO history of  - Chronic pain requiring opiods  - Diabetes  - Coronary Artery Disease  - Heart failure  - Heart attack  - Stroke  - DVT/VTE  - Cardiac arrhythmia  - Respiratory Failure/COPD  - Renal failure  - Anemia  - Advanced Liver disease     Duane Greene is an 71 y.o. male.    Chief Complaint: right shoulder pain  HPI: Pt is a 71 y.o. male complaining of right shoulder pain for multiple years. Pain had continually increased since the beginning. X-rays in the clinic show end-stage arthritic changes of the right shoulder. Pt has tried various conservative treatments which have failed to alleviate their symptoms, including injections and therapy. Various options are discussed with the patient. Risks, benefits and expectations were discussed with the patient. Patient understand the risks, benefits and expectations and wishes to proceed with surgery.   PCP:  Josetta Huddle, MD  D/C Plans: Home  PMH: Past Medical History:  Diagnosis Date   History of kidney stones    Hyperlipidemia    Right inguinal hernia    Sleep apnea    uses CPAP nightly    PSH: Past Surgical History:  Procedure Laterality Date   CHOLECYSTECTOMY N/A 06/13/2020   Procedure: LAPAROSCOPIC CHOLECYSTECTOMY;  Surgeon: Donnie Mesa, MD;  Location: [facility];  Service: General;  Laterality: N/A;   COLONOSCOPY     EXTRACORPOREAL SHOCK WAVE LITHOTRIPSY Left 10/18/2019   Procedure: EXTRACORPOREAL SHOCK WAVE LITHOTRIPSY (ESWL);  Surgeon: Raynelle Bring, MD;  Location: [facility];  Service: Urology;  Laterality: Left;   INGUINAL HERNIA REPAIR Right 06/30/2019   Procedure: RIGHT INGUINAL HERNIA REPAIR WITH MESH;  Surgeon: Donnie Mesa, MD;  Location: [facility];  Service: General;  Laterality: Right;    INTRAOPERATIVE CHOLANGIOGRAM N/A 06/13/2020   Procedure: INTRAOPERATIVE CHOLANGIOGRAM;  Surgeon: Donnie Mesa, MD;  Location: [facility];  Service: General;  Laterality: N/A;   KNEE ARTHROSCOPY Left    LUMBAR LAMINECTOMY/DECOMPRESSION MICRODISCECTOMY N/A 08/28/2020   Procedure: Laminectomy - Lumbar One-Two, Lumbar Two-Three, Lumbar Three-Four, Lumbar Four-Five, Lumbar Five-Sacral One;  Surgeon: Eustace Moore, MD;  Location: [facility];  Service: Neurosurgery;  Laterality: N/A;  posterior   SHOULDER ARTHROSCOPY Bilateral    TONSILLECTOMY      Social History:  reports that he has never smoked. He has never used smokeless tobacco. He reports that he does not drink alcohol and does not use drugs. BMI: Estimated body mass index is 30.29 kg/m as calculated from the following:   Height as of 08/28/20: 5\' 10"  (1.778 m).   Weight as of 08/28/20: 95.8 kg.  Lab Results  Component Value Date   ALBUMIN 4.2 04/21/2020   Diabetes: Patient does not have a diagnosis of diabetes.     Smoking Status:   reports that he has never smoked. He has never used smokeless tobacco.    Allergies:  No Known Allergies  Medications: No current facility-administered medications for this encounter.   Current Outpatient Medications  Medication Sig Dispense Refill   aspirin 81 MG EC tablet Take 81 mg by mouth at bedtime.      COVID-19 mRNA bivalent vaccine, Pfizer, (PFIZER COVID-19 VAC BIVALENT) injection Inject into the muscle. 0.3 mL 0   doxycycline (VIBRAMYCIN) 100 MG capsule Take 1  capsule (100 mg total) by mouth daily. 10 capsule 0   losartan-hydrochlorothiazide (HYZAAR) 50-12.5 MG tablet 1 tablet Orally Once a day 90 days 90 tablet 3   Multiple Vitamins-Minerals (MULTIVITAMIN WITH MINERALS) tablet Take 1 tablet by mouth at bedtime.     predniSONE (DELTASONE) 10 MG tablet Take 4 tablets by mouth once daily for 2 days then 3 tablets daily for 2 days then 2 tabs daily for 2 days then 1 daily for 2 days 20 tablet 0    simvastatin (ZOCOR) 80 MG tablet Take 40 mg by mouth at bedtime.      simvastatin (ZOCOR) 80 MG tablet TAKE 1 TABLET BY MOUTH EVERY EVENING 90 tablet 3   simvastatin (ZOCOR) 80 MG tablet Take 1 tablet by mouth daily in the evening 90 tablet 3   Tdap (BOOSTRIX) 5-2.5-18.5 LF-MCG/0.5 injection Inject into the muscle. 0.5 mL 0   terbinafine (LAMISIL) 250 MG tablet Take 250 mg by mouth every Sunday.      terbinafine (LAMISIL) 250 MG tablet Take 1 tablet (250 mg total) by mouth once a week to prevent onychomycosis  (nail fungus). 12 tablet 2    No results found for this or any previous visit (from the past 48 hour(s)). No results found.  ROS: Pain with rom of the right upper extremity  Physical Exam: Alert and oriented 71 y.o. male in no acute distress Cranial nerves 2-12 intact Cervical spine: full rom with no tenderness, nv intact distally Chest: active breath sounds bilaterally, no wheeze rhonchi or rales Heart: regular rate and rhythm, no murmur Abd: non tender non distended with active bowel sounds Hip is stable with rom  Right shoulder painful and weak rom Nv intact distally No rashes or edema distally  Assessment/Plan Assessment: right shoulder cuff arthropathy  Plan:  Patient will undergo a right reverse total shoulder by Dr. Veverly Fells at [place] Risks benefits and expectations were discussed with the patient. Patient understand risks, benefits and expectations and wishes to proceed. Preoperative templating of the joint replacement has been completed, documented, and submitted to the Operating Room personnel in order to optimize intra-operative equipment management.   Merla Riches PA-C, MPAS [facility] is now [employer] [facility] [address], [place], [place], [place] [address] Phone: [phone] [email or web address] Facebook  [employer]

## 2022-08-31 NOTE — Progress Notes (Signed)
COVID Vaccine received:  []  No [x]  Yes Date of any COVID positive Test in last 90 days:  PCP - Josetta Huddle, MD Cardiologist - none  Chest x-ray - n/a EKG -  08-24-2020  will repeat at PST Stress Test n/a  ECHO - n/a Cardiac Cath - n/a  Pacemaker/ICD device     [x]  N/A Spinal Cord Stimulator:[x]  No []  Yes      (Remind patient to bring remote DOS) Other Implants:   History of Sleep Apnea? []  No [x]  Yes   Sleep Study Date:   CPAP used?- []  No [x]  Yes  (Instruct to bring their mask & Tubing)  Does the patient monitor blood sugar? []  No []  Yes  []  N/A Does patient have a [employer] or Dexacom? []  No []  Yes   Fasting Blood Sugar Ranges-  Checks Blood Sugar _____ times a day  Blood Thinner Instructions: Aspirin Instructions: Last Dose:  ERAS Protocol Ordered: []  No  [x]  Yes PRE-SURGERY [x]  ENSURE  []  G2   Comments:   Activity level: Patient can / can not climb a flight of stairs without difficulty;  []  No CP  []  No SOB,  but would have ______   Anesthesia review: HTN  Patient denies shortness of breath, fever, cough and chest pain at PAT appointment.  Patient verbalized understanding and agreement to the Pre-Surgical Instructions that were given to them at this PAT appointment. Patient was also educated of the need to review these PAT instructions again prior to his/her surgery.I reviewed the appropriate phone numbers to call if they have any and questions or concerns.

## 2022-08-31 NOTE — Patient Instructions (Addendum)
SURGICAL WAITING ROOM VISITATION Patients having surgery or a procedure may have no more than 2 support people in the waiting area - these visitors may rotate in the visitor waiting room.   Children under the age of 60 must have an adult with them who is not the patient. If the patient needs to stay at the hospital during part of their recovery, the visitor guidelines for inpatient rooms apply.  PRE-OP VISITATION  Pre-op nurse will coordinate an appropriate time for 1 support person to accompany the patient in pre-op.  This support person may not rotate.  This visitor will be contacted when the time is appropriate for the visitor to come back in the pre-op area.  Please refer to the [facility] website for the visitor guidelines for Inpatients (after your surgery is over and you are in a regular room).  You are not required to quarantine at this time prior to your surgery. However, you must do this: Hand Hygiene often Do NOT share personal items Notify your provider if you are in close contact with someone who has COVID or you develop fever 100.4 or greater, new onset of sneezing, cough, sore throat, shortness of breath or body aches.   If you received a COVID test during your pre-op visit  it is requested that you wear a mask when out in public, stay away from anyone that may not be feeling well and notify your surgeon if you develop symptoms. If you test positive for Covid or have been in contact with anyone that has tested positive in the last 10 days please notify you surgeon.       Your procedure is scheduled on:  Friday  September 13, 2022  Report to [facility] Main Entrance.  Report to admitting at:   07:30  AM  +++++Call this number if you have any questions or problems the morning of surgery [phone]  Do not eat food :After Midnight the night prior to your surgery/procedure.  After Midnight you may have the following liquids until   07:00 AM DAY OF SURGERY  Clear  Liquid Diet Water Black Coffee (sugar ok, NO MILK/CREAM OR CREAMERS)  Tea (sugar ok, NO MILK/CREAM OR CREAMERS) regular and decaf                             Plain Jell-O  with no fruit (NO RED)                                           Fruit ices (not with fruit pulp, NO RED)                                     Popsicles (NO RED)                                                                  Juice: apple, WHITE grape, WHITE cranberry Sports drinks like Gatorade or Powerade (NO RED)  The day of surgery:  Drink ONE (1) Pre-Surgery Clear Ensure at   7:00   AM the morning of surgery. Drink in one sitting. Do not sip.  This drink was given to you during your hospital pre-op appointment visit. Nothing else to drink after completing the Pre-Surgery Clear Ensure : No candy, chewing gum or throat lozenges.    FOLLOW ANY ADDITIONAL PRE OP INSTRUCTIONS YOU RECEIVED FROM YOUR SURGEON'S OFFICE!!!   Oral Hygiene is also important to reduce your risk of infection.        Remember - BRUSH YOUR TEETH THE MORNING OF SURGERY WITH YOUR REGULAR TOOTHPASTE  Do NOT smoke after Midnight the night before surgery.  Take ONLY these medicines the morning of surgery with A SIP OF WATER: NONE  If You have been diagnosed with Sleep Apnea - Bring CPAP mask and tubing day of surgery. We will provide you with a CPAP machine on the day of your surgery.                   You may not have any metal on your body including jewelry, and body piercing  Do not wear  lotions, powders, cologne, or deodorant  Men may shave face and neck.  Contacts, Hearing Aids, dentures or bridgework may not be worn into surgery.    DO NOT BRING YOUR HOME MEDICATIONS TO THE HOSPITAL. PHARMACY WILL DISPENSE MEDICATIONS LISTED ON YOUR MEDICATION LIST TO YOU DURING YOUR ADMISSION IN THE HOSPITAL!   Patients discharged on the day of surgery will not be allowed to drive home.  Someone NEEDS to stay with you for the  first 24 hours after anesthesia.  Special Instructions: Bring a copy of your healthcare power of attorney and living will documents the day of surgery, if you wish to have them scanned into your [facility] Records- EPIC  Please read over the following fact sheets you were given: IF YOU HAVE QUESTIONS ABOUT YOUR PRE-OP INSTRUCTIONS, PLEASE CALL [phone]  (KAY)   [facility] - Preparing for Surgery Before surgery, you can play an important role.  Because skin is not sterile, your skin needs to be as free of germs as possible.  You can reduce the number of germs on your skin by washing with CHG (chlorahexidine gluconate) soap before surgery.  CHG is an antiseptic cleaner which kills germs and bonds with the skin to continue killing germs even after washing. Please DO NOT use if you have an allergy to CHG or antibacterial soaps.  If your skin becomes reddened/irritated stop using the CHG and inform your nurse when you arrive at Short Stay. Do not shave (including legs and underarms) for at least 48 hours prior to the first CHG shower.  You may shave your face/neck.  Please follow these instructions carefully:  1.  Shower with CHG Soap the night before surgery and the  morning of surgery.  2.  If you choose to wash your hair, wash your hair first as usual with your normal  shampoo.  3.  After you shampoo, rinse your hair and body thoroughly to remove the shampoo.                             4.  Use CHG as you would any other liquid soap.  You can apply chg directly to the skin and wash.  Gently with a scrungie or clean washcloth.  5.  Apply the CHG  Soap to your body ONLY FROM THE NECK DOWN.   Do not use on face/ open                           Wound or open sores. Avoid contact with eyes, ears mouth and genitals (private parts).                       Wash face,  Genitals (private parts) with your normal soap.             6.  Wash thoroughly, paying special attention to the area where your   surgery  will be performed.  7.  Thoroughly rinse your body with warm water from the neck down.  8.  DO NOT shower/wash with your normal soap after using and rinsing off the CHG Soap.            9.  Pat yourself dry with a clean towel.            10.  Wear clean pajamas.            11.  Place clean sheets on your bed the night of your first shower and do not  sleep with pets.  ON THE DAY OF SURGERY : Do not apply any lotions/deodorants the morning of surgery.  Please wear clean clothes to the hospital/surgery center.     Preparing for Total Shoulder Arthroplasty ================================================================= Please follow these instructions carefully, in addition to any other special Bathing information that was explained to you at the Presurgical Appointment:  BENZOYL PEROXIDE 5% GEL: Used to kill bacteria on the skin which could cause an infection at the surgery site.   Please do not use if you have an allergy to benzoyl peroxide. If your skin becomes reddened/irritated stop using the benzoyl peroxide and inform your Doctor.   Starting two days before surgery, apply as follows:  1. Apply benzoyl peroxide gel in the morning and at night. Apply after taking a shower. If you are not taking a shower, clean entire shoulder front, back, and side, along with the armpit with a clean wet washcloth.  2. Place a quarter-sized dollop of the gel on your SHOULDER and rub in thoroughly, making sure to cover the front, back, and side of your shoulder, along with the armpit.   2 Days prior to Surgery  (Wednesday  09-11-2022) First Dose  _______ Morning Second Dose  _______ Night  Day Before Surgery      (Thursday   09-12-2022) First Dose  ______ Morning  On the night before surgery, wash your entire body (except hair, face and private areas) with CHG Soap. THEN, rub in the LAST application of the Benzoyl Peroxide Gel on your shoulder.   3. On the Morning of Surgery wash your  BODY AGAIN with CHG Soap (except hair, face and private areas)  4. DO NOT USE THE BENZOYL PEROXIDE GEL ON THE DAY OF YOUR SURGERY         FAILURE TO FOLLOW THESE INSTRUCTIONS MAY RESULT IN THE CANCELLATION OF YOUR SURGERY  PATIENT SIGNATURE_________________________________  NURSE SIGNATURE__________________________________  ________________________________________________________________________       Duane Greene    An incentive spirometer is a tool that can help keep your lungs clear and active. This tool measures how well you are filling your lungs with each breath. Taking long deep breaths may help reverse or decrease the chance of developing breathing (pulmonary)  problems (especially infection) following: A long period of time when you are unable to move or be active. BEFORE THE PROCEDURE  If the spirometer includes an indicator to show your best effort, your nurse or respiratory therapist will set it to a desired goal. If possible, sit up straight or lean slightly forward. Try not to slouch. Hold the incentive spirometer in an upright position. INSTRUCTIONS FOR USE  Sit on the edge of your bed if possible, or sit up as far as you can in bed or on a chair. Hold the incentive spirometer in an upright position. Breathe out normally. Place the mouthpiece in your mouth and seal your lips tightly around it. Breathe in slowly and as deeply as possible, raising the piston or the ball toward the top of the column. Hold your breath for 3-5 seconds or for as long as possible. Allow the piston or ball to fall to the bottom of the column. Remove the mouthpiece from your mouth and breathe out normally. Rest for a few seconds and repeat Steps 1 through 7 at least 10 times every 1-2 hours when you are awake. Take your time and take a few normal breaths between deep breaths. The spirometer may include an indicator to show your best effort. Use the indicator as a goal to work  toward during each repetition. After each set of 10 deep breaths, practice coughing to be sure your lungs are clear. If you have an incision (the cut made at the time of surgery), support your incision when coughing by placing a pillow or rolled up towels firmly against it. Once you are able to get out of bed, walk around indoors and cough well. You may stop using the incentive spirometer when instructed by your caregiver.  RISKS AND COMPLICATIONS Take your time so you do not get dizzy or light-headed. If you are in pain, you may need to take or ask for pain medication before doing incentive spirometry. It is harder to take a deep breath if you are having pain. AFTER USE Rest and breathe slowly and easily. It can be helpful to keep track of a log of your progress. Your caregiver can provide you with a simple table to help with this. If you are using the spirometer at home, follow these instructions: SEEK MEDICAL CARE IF:  You are having difficultly using the spirometer. You have trouble using the spirometer as often as instructed. Your pain medication is not giving enough relief while using the spirometer. You develop fever of 100.5 F (38.1 C) or higher.                                                                                                    SEEK IMMEDIATE MEDICAL CARE IF:  You cough up bloody sputum that had not been present before. You develop fever of 102 F (38.9 C) or greater. You develop worsening pain at or near the incision site. MAKE SURE YOU:  Understand these instructions. Will watch your condition. Will get help right away if you are not doing well or get worse. Document  Released: 03/17/2007 Document Revised: 01/27/2012 Document Reviewed: 05/18/2007 [facility] Patient Information 2014 [place], [place].

## 2022-09-02 ENCOUNTER — Other Ambulatory Visit: Payer: Self-pay

## 2022-09-02 ENCOUNTER — Encounter (HOSPITAL_COMMUNITY): Payer: Self-pay

## 2022-09-02 ENCOUNTER — Encounter (HOSPITAL_COMMUNITY)
Admission: RE | Admit: 2022-09-02 | Discharge: 2022-09-02 | Disposition: A | Discharge status: Home / Self Care | Payer: PPO | Source: Ambulatory Visit | Attending: Orthopedic Surgery | Admitting: Orthopedic Surgery

## 2022-09-02 VITALS — BP 156/60 | HR 74 | Temp 98.1°F | Resp 16 | Ht 70.0 in | Wt 200.0 lb

## 2022-09-02 DIAGNOSIS — Z01818 Encounter for other preprocedural examination: Secondary | ICD-10-CM

## 2022-09-02 DIAGNOSIS — I10 Essential (primary) hypertension: Secondary | ICD-10-CM | POA: Diagnosis not present

## 2022-09-02 HISTORY — DX: Essential (primary) hypertension: I10

## 2022-09-02 HISTORY — DX: Unspecified osteoarthritis, unspecified site: M19.90

## 2022-09-02 LAB — CBC
HCT: 46.2 % (ref 39.0–52.0)
Hemoglobin: 15.8 g/dL (ref 13.0–17.0)
MCH: 32.5 pg (ref 26.0–34.0)
MCHC: 34.2 g/dL (ref 30.0–36.0)
MCV: 95.1 fL (ref 80.0–100.0)
Platelets: 212 10*3/uL (ref 150–400)
RBC: 4.86 MIL/uL (ref 4.22–5.81)
RDW: 12.5 % (ref 11.5–15.5)
WBC: 5.3 10*3/uL (ref 4.0–10.5)
nRBC: 0 % (ref 0.0–0.2)

## 2022-09-02 LAB — BASIC METABOLIC PANEL
Anion gap: 5 (ref 5–15)
BUN: 23 mg/dL (ref 8–23)
CO2: 27 mmol/L (ref 22–32)
Calcium: 9.2 mg/dL (ref 8.9–10.3)
Chloride: 107 mmol/L (ref 98–111)
Creatinine, Ser: 0.9 mg/dL (ref 0.61–1.24)
GFR, Estimated: >60 mL/min (ref >60)
Glucose, Bld: 110 mg/dL — ABNORMAL HIGH (ref 70–99)
Potassium: 3.9 mmol/L (ref 3.5–5.1)
Sodium: 139 mmol/L (ref 135–145)

## 2022-09-02 LAB — SURGICAL PCR SCREEN
MRSA, PCR: NEGATIVE
Staphylococcus aureus: POSITIVE — AB

## 2022-09-03 NOTE — Progress Notes (Signed)
Patient's PCR screen is positive for [facility]. Appropriate notes have been placed on the patient's chart. This note has been routed to Dr.  Veverly Fells  for review. The Patient's surgery is currently Scheduled for:  Friday September 13, 2022  Leota Jacobsen, BSN, CVRN-BC   Pre-Surgical Testing Nurse [place]  [phone]

## 2022-09-05 NOTE — Progress Notes (Signed)
Updated date of surgery: Friday, Oct. 20, 20023  Updated time of arrival: 07:45 AM  Patient will be discharged from hospital and monitored at home for 24 hours by: Bonnie Martinique [phone]  Patient denies any changes in allergies, medications, medical history since pre op appointment on:  Pre op instructions reviewed, follow up questions addressed and patient verbalized understanding at this time.

## 2022-09-06 ENCOUNTER — Other Ambulatory Visit (HOSPITAL_BASED_OUTPATIENT_CLINIC_OR_DEPARTMENT_OTHER): Payer: Self-pay

## 2022-09-06 ENCOUNTER — Ambulatory Visit (HOSPITAL_COMMUNITY): Payer: PPO

## 2022-09-06 ENCOUNTER — Other Ambulatory Visit: Payer: Self-pay

## 2022-09-06 ENCOUNTER — Encounter (HOSPITAL_COMMUNITY): Payer: Self-pay | Admitting: Orthopedic Surgery

## 2022-09-06 ENCOUNTER — Ambulatory Visit (HOSPITAL_BASED_OUTPATIENT_CLINIC_OR_DEPARTMENT_OTHER): Payer: PPO | Admitting: Certified Registered"

## 2022-09-06 ENCOUNTER — Encounter (HOSPITAL_COMMUNITY)
Admission: RE | Disposition: A | Discharge status: Home / Self Care | Payer: Self-pay | Source: Ambulatory Visit | Attending: Orthopedic Surgery

## 2022-09-06 ENCOUNTER — Ambulatory Visit (HOSPITAL_COMMUNITY): Payer: PPO | Admitting: Certified Registered"

## 2022-09-06 ENCOUNTER — Observation Stay (HOSPITAL_COMMUNITY)
Admission: RE | Admit: 2022-09-06 | Discharge: 2022-09-07 | Disposition: A | Discharge status: Home / Self Care | Payer: PPO | Source: Ambulatory Visit | Attending: Orthopedic Surgery | Admitting: Orthopedic Surgery

## 2022-09-06 DIAGNOSIS — Z23 Encounter for immunization: Secondary | ICD-10-CM | POA: Insufficient documentation

## 2022-09-06 DIAGNOSIS — Z96611 Presence of right artificial shoulder joint: Secondary | ICD-10-CM | POA: Diagnosis not present

## 2022-09-06 DIAGNOSIS — Z79899 Other long term (current) drug therapy: Secondary | ICD-10-CM | POA: Insufficient documentation

## 2022-09-06 DIAGNOSIS — Z471 Aftercare following joint replacement surgery: Secondary | ICD-10-CM | POA: Diagnosis not present

## 2022-09-06 DIAGNOSIS — M75121 Complete rotator cuff tear or rupture of right shoulder, not specified as traumatic: Secondary | ICD-10-CM | POA: Diagnosis not present

## 2022-09-06 DIAGNOSIS — I10 Essential (primary) hypertension: Secondary | ICD-10-CM | POA: Diagnosis not present

## 2022-09-06 DIAGNOSIS — M19011 Primary osteoarthritis, right shoulder: Secondary | ICD-10-CM | POA: Diagnosis not present

## 2022-09-06 DIAGNOSIS — G8918 Other acute postprocedural pain: Secondary | ICD-10-CM | POA: Diagnosis not present

## 2022-09-06 DIAGNOSIS — M75101 Unspecified rotator cuff tear or rupture of right shoulder, not specified as traumatic: Secondary | ICD-10-CM | POA: Diagnosis not present

## 2022-09-06 DIAGNOSIS — Z7982 Long term (current) use of aspirin: Secondary | ICD-10-CM | POA: Diagnosis not present

## 2022-09-06 HISTORY — PX: REVERSE SHOULDER ARTHROPLASTY: SHX5054

## 2022-09-06 SURGERY — ARTHROPLASTY, SHOULDER, TOTAL, REVERSE
Anesthesia: General | Site: Shoulder | Laterality: Right

## 2022-09-06 MED ORDER — CEFAZOLIN SODIUM-DEXTROSE 2-4 GM/100ML-% IV SOLN
2.0000 g | INTRAVENOUS | Status: AC
  Administered 2022-09-06: 2 g via INTRAVENOUS
  Filled 2022-09-06: qty 100

## 2022-09-06 MED ORDER — ONDANSETRON HCL 4 MG/2ML IJ SOLN
INTRAMUSCULAR | Status: DC | PRN
  Administered 2022-09-06: 4 mg via INTRAVENOUS

## 2022-09-06 MED ORDER — ONDANSETRON HCL 4 MG/2ML IJ SOLN
INTRAMUSCULAR | Status: AC
  Filled 2022-09-06: qty 2

## 2022-09-06 MED ORDER — MENTHOL 3 MG MT LOZG: 1.0000 | LOZENGE | OROMUCOSAL | Status: DC | PRN

## 2022-09-06 MED ORDER — ADULT MULTIVITAMIN W/MINERALS CH
1.0000 | ORAL_TABLET | Freq: Every day | ORAL | Status: DC
  Administered 2022-09-06: 1 via ORAL
  Filled 2022-09-06: qty 1

## 2022-09-06 MED ORDER — OXYCODONE HCL 5 MG/5ML PO SOLN: 5.0000 mg | Freq: Once | ORAL | Status: DC | PRN

## 2022-09-06 MED ORDER — ACETAMINOPHEN 325 MG PO TABS
325.0000 mg | ORAL_TABLET | Freq: Four times a day (QID) | ORAL | Status: DC | PRN
  Administered 2022-09-06: 650 mg via ORAL
  Filled 2022-09-06: qty 2

## 2022-09-06 MED ORDER — PHENYLEPHRINE HCL (PRESSORS) 10 MG/ML IV SOLN
INTRAVENOUS | Status: AC
  Filled 2022-09-06: qty 1

## 2022-09-06 MED ORDER — LOSARTAN POTASSIUM-HCTZ 50-12.5 MG PO TABS: 1.0000 | ORAL_TABLET | Freq: Every day | ORAL | Status: DC

## 2022-09-06 MED ORDER — OXYCODONE-ACETAMINOPHEN 5-325 MG PO TABS
1.0000 | ORAL_TABLET | ORAL | 0 refills | Status: AC | PRN
  Filled 2022-09-06: qty 20, 4d supply, fill #0

## 2022-09-06 MED ORDER — DOXYCYCLINE HYCLATE 100 MG PO CAPS: 100.0000 mg | ORAL_CAPSULE | Freq: Every day | ORAL | Status: DC

## 2022-09-06 MED ORDER — METOCLOPRAMIDE HCL 5 MG/ML IJ SOLN: 5.0000 mg | Freq: Three times a day (TID) | INTRAMUSCULAR | Status: DC | PRN

## 2022-09-06 MED ORDER — OXYCODONE HCL 5 MG PO TABS
5.0000 mg | ORAL_TABLET | ORAL | Status: DC | PRN
  Administered 2022-09-07 (×2): 5 mg via ORAL
  Filled 2022-09-06 (×2): qty 1

## 2022-09-06 MED ORDER — DEXAMETHASONE SODIUM PHOSPHATE 10 MG/ML IJ SOLN
INTRAMUSCULAR | Status: AC
  Filled 2022-09-06: qty 1

## 2022-09-06 MED ORDER — DEXAMETHASONE SODIUM PHOSPHATE 10 MG/ML IJ SOLN
INTRAMUSCULAR | Status: DC | PRN
  Administered 2022-09-06: 8 mg via INTRAVENOUS

## 2022-09-06 MED ORDER — BUPIVACAINE LIPOSOME 1.3 % IJ SUSP
INTRAMUSCULAR | Status: DC | PRN
  Administered 2022-09-06: 10 mL via PERINEURAL

## 2022-09-06 MED ORDER — METOCLOPRAMIDE HCL 5 MG PO TABS: 5.0000 mg | ORAL_TABLET | Freq: Three times a day (TID) | ORAL | Status: DC | PRN

## 2022-09-06 MED ORDER — PHENOL 1.4 % MT LIQD
1.0000 | OROMUCOSAL | Status: DC | PRN
  Administered 2022-09-06: 1 via OROMUCOSAL
  Filled 2022-09-06: qty 177

## 2022-09-06 MED ORDER — ROCURONIUM BROMIDE 10 MG/ML (PF) SYRINGE
PREFILLED_SYRINGE | INTRAVENOUS | Status: DC | PRN
  Administered 2022-09-06: 90 mg via INTRAVENOUS

## 2022-09-06 MED ORDER — ASPIRIN 81 MG PO TBEC
81.0000 mg | DELAYED_RELEASE_TABLET | Freq: Every day | ORAL | Status: DC
  Administered 2022-09-06: 81 mg via ORAL
  Filled 2022-09-06: qty 1

## 2022-09-06 MED ORDER — PROPOFOL 10 MG/ML IV BOLUS
INTRAVENOUS | Status: DC | PRN
  Administered 2022-09-06: 180 mg via INTRAVENOUS

## 2022-09-06 MED ORDER — POLYETHYLENE GLYCOL 3350 17 G PO PACK: 17.0000 g | PACK | Freq: Every day | ORAL | Status: DC | PRN

## 2022-09-06 MED ORDER — HYDROMORPHONE HCL 1 MG/ML IJ SOLN: 0.2500 mg | INTRAMUSCULAR | Status: DC | PRN

## 2022-09-06 MED ORDER — TERBINAFINE HCL 250 MG PO TABS: 250.0000 mg | ORAL_TABLET | ORAL | Status: DC

## 2022-09-06 MED ORDER — LOSARTAN POTASSIUM 50 MG PO TABS
50.0000 mg | ORAL_TABLET | Freq: Every day | ORAL | Status: DC
  Administered 2022-09-07: 50 mg via ORAL
  Filled 2022-09-06: qty 1

## 2022-09-06 MED ORDER — ONDANSETRON HCL 4 MG PO TABS: 4.0000 mg | ORAL_TABLET | Freq: Four times a day (QID) | ORAL | Status: DC | PRN

## 2022-09-06 MED ORDER — SIMVASTATIN 40 MG PO TABS
40.0000 mg | ORAL_TABLET | Freq: Every day | ORAL | Status: DC
  Administered 2022-09-06: 40 mg via ORAL
  Filled 2022-09-06: qty 1

## 2022-09-06 MED ORDER — ROCURONIUM BROMIDE 10 MG/ML (PF) SYRINGE
PREFILLED_SYRINGE | INTRAVENOUS | Status: AC
  Filled 2022-09-06: qty 10

## 2022-09-06 MED ORDER — PROMETHAZINE HCL 25 MG/ML IJ SOLN: 6.2500 mg | INTRAMUSCULAR | Status: DC | PRN

## 2022-09-06 MED ORDER — DOCUSATE SODIUM 100 MG PO CAPS
100.0000 mg | ORAL_CAPSULE | Freq: Two times a day (BID) | ORAL | Status: DC
  Administered 2022-09-06 – 2022-09-07 (×2): 100 mg via ORAL
  Filled 2022-09-06 (×2): qty 1

## 2022-09-06 MED ORDER — SODIUM CHLORIDE 0.9 % IR SOLN
Status: DC | PRN
  Administered 2022-09-06: 1000 mL

## 2022-09-06 MED ORDER — BUPIVACAINE-EPINEPHRINE (PF) 0.25% -1:200000 IJ SOLN
INTRAMUSCULAR | Status: AC
  Filled 2022-09-06: qty 30

## 2022-09-06 MED ORDER — FENTANYL CITRATE PF 50 MCG/ML IJ SOSY
50.0000 ug | PREFILLED_SYRINGE | INTRAMUSCULAR | Status: DC
  Administered 2022-09-06: 50 ug via INTRAVENOUS
  Filled 2022-09-06: qty 2

## 2022-09-06 MED ORDER — CEFAZOLIN SODIUM-DEXTROSE 2-4 GM/100ML-% IV SOLN
2.0000 g | Freq: Four times a day (QID) | INTRAVENOUS | Status: AC
  Administered 2022-09-06 – 2022-09-07 (×3): 2 g via INTRAVENOUS
  Filled 2022-09-06 (×3): qty 100

## 2022-09-06 MED ORDER — PHENYLEPHRINE HCL-NACL 20-0.9 MG/250ML-% IV SOLN
INTRAVENOUS | Status: DC | PRN
  Administered 2022-09-06: 40 ug/min via INTRAVENOUS

## 2022-09-06 MED ORDER — ONDANSETRON HCL 4 MG/2ML IJ SOLN: 4.0000 mg | Freq: Four times a day (QID) | INTRAMUSCULAR | Status: DC | PRN

## 2022-09-06 MED ORDER — MIDAZOLAM HCL 2 MG/2ML IJ SOLN
1.0000 mg | INTRAMUSCULAR | Status: DC
  Administered 2022-09-06: 2 mg via INTRAVENOUS
  Filled 2022-09-06: qty 2

## 2022-09-06 MED ORDER — BISACODYL 10 MG RE SUPP: 10.0000 mg | Freq: Every day | RECTAL | Status: DC | PRN

## 2022-09-06 MED ORDER — BUPIVACAINE-EPINEPHRINE (PF) 0.25% -1:200000 IJ SOLN
INTRAMUSCULAR | Status: DC | PRN
  Administered 2022-09-06: 30 mL

## 2022-09-06 MED ORDER — HYDROMORPHONE HCL 1 MG/ML IJ SOLN: 0.5000 mg | INTRAMUSCULAR | Status: DC | PRN

## 2022-09-06 MED ORDER — FENTANYL CITRATE (PF) 100 MCG/2ML IJ SOLN
INTRAMUSCULAR | Status: AC
  Filled 2022-09-06: qty 2

## 2022-09-06 MED ORDER — PHENYLEPHRINE 80 MCG/ML (10ML) SYRINGE FOR IV PUSH (FOR BLOOD PRESSURE SUPPORT)
PREFILLED_SYRINGE | INTRAVENOUS | Status: DC | PRN
  Administered 2022-09-06: 80 ug via INTRAVENOUS

## 2022-09-06 MED ORDER — VITAMIN D 25 MCG (1000 UNIT) PO TABS
1000.0000 [IU] | ORAL_TABLET | Freq: Every day | ORAL | Status: DC
  Administered 2022-09-07: 1000 [IU] via ORAL
  Filled 2022-09-06: qty 1

## 2022-09-06 MED ORDER — METHOCARBAMOL 500 MG PO TABS
500.0000 mg | ORAL_TABLET | Freq: Four times a day (QID) | ORAL | Status: DC | PRN
  Administered 2022-09-06 – 2022-09-07 (×2): 500 mg via ORAL
  Filled 2022-09-06 (×2): qty 1

## 2022-09-06 MED ORDER — SUGAMMADEX SODIUM 200 MG/2ML IV SOLN
INTRAVENOUS | Status: DC | PRN
  Administered 2022-09-06: 200 mg via INTRAVENOUS

## 2022-09-06 MED ORDER — LACTATED RINGERS IV SOLN: INTRAVENOUS | Status: DC

## 2022-09-06 MED ORDER — FENTANYL CITRATE (PF) 100 MCG/2ML IJ SOLN
INTRAMUSCULAR | Status: DC | PRN
  Administered 2022-09-06: 50 ug via INTRAVENOUS

## 2022-09-06 MED ORDER — LIDOCAINE HCL (PF) 2 % IJ SOLN
INTRAMUSCULAR | Status: AC
  Filled 2022-09-06: qty 5

## 2022-09-06 MED ORDER — BUPIVACAINE HCL (PF) 0.5 % IJ SOLN
INTRAMUSCULAR | Status: DC | PRN
  Administered 2022-09-06: 20 mL via PERINEURAL

## 2022-09-06 MED ORDER — AMISULPRIDE (ANTIEMETIC) 5 MG/2ML IV SOLN: 10.0000 mg | Freq: Once | INTRAVENOUS | Status: DC | PRN

## 2022-09-06 MED ORDER — ONDANSETRON HCL 4 MG PO TABS
4.0000 mg | ORAL_TABLET | Freq: Three times a day (TID) | ORAL | 1 refills | Status: DC | PRN
  Filled 2022-09-06: qty 30, 10d supply, fill #0

## 2022-09-06 MED ORDER — PHENYLEPHRINE 80 MCG/ML (10ML) SYRINGE FOR IV PUSH (FOR BLOOD PRESSURE SUPPORT)
PREFILLED_SYRINGE | INTRAVENOUS | Status: AC
  Filled 2022-09-06: qty 10

## 2022-09-06 MED ORDER — CHLORHEXIDINE GLUCONATE 0.12 % MT SOLN
15.0000 mL | Freq: Once | OROMUCOSAL | Status: AC
  Administered 2022-09-06: 15 mL via OROMUCOSAL

## 2022-09-06 MED ORDER — LIDOCAINE 2% (20 MG/ML) 5 ML SYRINGE
INTRAMUSCULAR | Status: DC | PRN
  Administered 2022-09-06: 40 mg via INTRAVENOUS

## 2022-09-06 MED ORDER — PROPOFOL 10 MG/ML IV BOLUS
INTRAVENOUS | Status: AC
  Filled 2022-09-06: qty 20

## 2022-09-06 MED ORDER — ORAL CARE MOUTH RINSE: 15.0000 mL | Freq: Once | OROMUCOSAL | Status: AC

## 2022-09-06 MED ORDER — OXYCODONE HCL 5 MG PO TABS: 5.0000 mg | ORAL_TABLET | Freq: Once | ORAL | Status: DC | PRN

## 2022-09-06 MED ORDER — METHOCARBAMOL 1000 MG/10ML IJ SOLN: 500.0000 mg | Freq: Four times a day (QID) | INTRAVENOUS | Status: DC | PRN

## 2022-09-06 MED ORDER — SODIUM CHLORIDE 0.9 % IV SOLN: INTRAVENOUS | Status: DC

## 2022-09-06 MED ORDER — METHOCARBAMOL 500 MG PO TABS
500.0000 mg | ORAL_TABLET | Freq: Four times a day (QID) | ORAL | 1 refills | Status: DC
  Filled 2022-09-06: qty 40, 10d supply, fill #0
  Filled 2023-01-23: qty 40, 10d supply, fill #1

## 2022-09-06 MED ORDER — HYDROCHLOROTHIAZIDE 12.5 MG PO TABS
12.5000 mg | ORAL_TABLET | Freq: Every day | ORAL | Status: DC
  Administered 2022-09-07: 12.5 mg via ORAL
  Filled 2022-09-06: qty 1

## 2022-09-06 SURGICAL SUPPLY — 74 items
AID PSTN UNV HD RSTRNT DISP (MISCELLANEOUS) ×1
BAG COUNTER SPONGE SURGICOUNT (BAG) IMPLANT
BAG SPEC THK2 15X12 ZIP CLS (MISCELLANEOUS)
BAG SPNG CNTER NS LX DISP (BAG)
BAG ZIPLOCK 12X15 (MISCELLANEOUS) IMPLANT
BIT DRILL 1.6MX128 (BIT) IMPLANT
BIT DRILL 170X2.5X (BIT) IMPLANT
BIT DRL 170X2.5X (BIT) ×1
BLADE SAG 18X100X1.27 (BLADE) ×2 IMPLANT
COVER BACK TABLE 60X90IN (DRAPES) ×2 IMPLANT
COVER SURGICAL LIGHT HANDLE (MISCELLANEOUS) ×2 IMPLANT
CUP HUMERAL 42 PLUS 3 (Orthopedic Implant) IMPLANT
DRAPE INCISE IOBAN 66X45 STRL (DRAPES) ×2 IMPLANT
DRAPE ORTHO SPLIT 77X108 STRL (DRAPES) ×2
DRAPE SHEET LG 3/4 BI-LAMINATE (DRAPES) ×2 IMPLANT
DRAPE SURG ORHT 6 SPLT 77X108 (DRAPES) ×4 IMPLANT
DRAPE TOP 10253 STERILE (DRAPES) ×2 IMPLANT
DRAPE U-SHAPE 47X51 STRL (DRAPES) ×2 IMPLANT
DRILL 2.5 (BIT) ×1
DRSG ADAPTIC 3X8 NADH LF (GAUZE/BANDAGES/DRESSINGS) ×2 IMPLANT
DURAPREP 26ML APPLICATOR (WOUND CARE) ×2 IMPLANT
ELECT BLADE TIP CTD 4 INCH (ELECTRODE) ×2 IMPLANT
ELECT NDL TIP 2.8 STRL (NEEDLE) ×2 IMPLANT
ELECT NEEDLE TIP 2.8 STRL (NEEDLE) ×1 IMPLANT
ELECT REM PT RETURN 15FT ADLT (MISCELLANEOUS) ×2 IMPLANT
EPIPHYSI RIGHT SZ 2 (Shoulder) ×1 IMPLANT
EPIPHYSIS RIGHT SZ 2 (Shoulder) IMPLANT
FACESHIELD WRAPAROUND (MASK) ×1 IMPLANT
FACESHIELD WRAPAROUND OR TEAM (MASK) ×2 IMPLANT
GAUZE PAD ABD 7.5X8 STRL (GAUZE/BANDAGES/DRESSINGS) IMPLANT
GAUZE PAD ABD 8X10 STRL (GAUZE/BANDAGES/DRESSINGS) ×2 IMPLANT
GAUZE SPONGE 4X4 12PLY STRL (GAUZE/BANDAGES/DRESSINGS) ×2 IMPLANT
GLENOSPHERE XTEND LAT 42+0 STD (Miscellaneous) IMPLANT
GLOVE BIOGEL PI IND STRL 7.5 (GLOVE) ×2 IMPLANT
GLOVE BIOGEL PI IND STRL 8.5 (GLOVE) ×2 IMPLANT
GLOVE ORTHO TXT STRL SZ7.5 (GLOVE) ×2 IMPLANT
GLOVE SURG ORTHO 8.5 STRL (GLOVE) ×2 IMPLANT
GOWN STRL REUS W/ TWL XL LVL3 (GOWN DISPOSABLE) ×4 IMPLANT
GOWN STRL REUS W/TWL XL LVL3 (GOWN DISPOSABLE) ×2
KIT BASIN OR (CUSTOM PROCEDURE TRAY) ×2 IMPLANT
KIT TURNOVER KIT A (KITS) IMPLANT
MANIFOLD NEPTUNE II (INSTRUMENTS) ×2 IMPLANT
METAGLENE DELTA EXTEND (Trauma) IMPLANT
METAGLENE DXTEND (Trauma) ×1 IMPLANT
NDL MAYO CATGUT SZ4 TPR NDL (NEEDLE) ×2 IMPLANT
NEEDLE MAYO CATGUT SZ4 (NEEDLE) ×1 IMPLANT
NS IRRIG 1000ML POUR BTL (IV SOLUTION) ×2 IMPLANT
PACK SHOULDER (CUSTOM PROCEDURE TRAY) ×2 IMPLANT
PIN GUIDE 1.2 (PIN) IMPLANT
PIN GUIDE GLENOPHERE 1.5MX300M (PIN) IMPLANT
PIN METAGLENE 2.5 (PIN) IMPLANT
PROTECTOR NERVE ULNAR (MISCELLANEOUS) ×2 IMPLANT
RESTRAINT HEAD UNIVERSAL NS (MISCELLANEOUS) ×2 IMPLANT
SCREW 4.5X18MM (Screw) ×1 IMPLANT
SCREW 4.5X36MM (Screw) IMPLANT
SCREW BN 18X4.5XSTRL SHLDR (Screw) IMPLANT
SCREW LOCK 42 (Screw) IMPLANT
SLING ARM FOAM STRAP LRG (SOFTGOODS) IMPLANT
SMARTMIX MINI TOWER (MISCELLANEOUS)
SPIKE FLUID TRANSFER (MISCELLANEOUS) ×2 IMPLANT
SPONGE T-LAP 4X18 ~~LOC~~+RFID (SPONGE) ×2 IMPLANT
STEM DELTA DIA 10 HA (Stem) IMPLANT
STRIP CLOSURE SKIN 1/2X4 (GAUZE/BANDAGES/DRESSINGS) ×2 IMPLANT
SUCTION FRAZIER HANDLE 10FR (MISCELLANEOUS) ×1
SUCTION TUBE FRAZIER 10FR DISP (MISCELLANEOUS) ×2 IMPLANT
SUT FIBERWIRE #2 38 T-5 BLUE (SUTURE) ×4
SUT MNCRL AB 4-0 PS2 18 (SUTURE) ×2 IMPLANT
SUT VIC AB 0 CT1 36 (SUTURE) ×4 IMPLANT
SUT VIC AB 0 CT2 27 (SUTURE) ×2 IMPLANT
SUT VIC AB 2-0 CT1 27 (SUTURE) ×1
SUT VIC AB 2-0 CT1 TAPERPNT 27 (SUTURE) ×2 IMPLANT
SUTURE FIBERWR #2 38 T-5 BLUE (SUTURE) ×4 IMPLANT
TOWEL OR 17X26 10 PK STRL BLUE (TOWEL DISPOSABLE) ×2 IMPLANT
TOWER SMARTMIX MINI (MISCELLANEOUS) IMPLANT

## 2022-09-06 NOTE — Plan of Care (Signed)
  Problem: Activity: Goal: Ability to tolerate increased activity will improve Outcome: Progressing   Problem: Pain Management: Goal: Pain level will decrease with appropriate interventions Outcome: Progressing   

## 2022-09-06 NOTE — Op Note (Signed)
NAME: Duane Greene, Duane L. MEDICAL RECORD NO: [id number] ACCOUNT NO: [id number] DATE OF BIRTH: [birth year] FACILITY: [facility] LOCATION: [facility] PHYSICIAN: Doran Heater. Veverly Fells, MD  Operative Report   DATE OF PROCEDURE: 09/06/2022  PREOPERATIVE DIAGNOSIS:  Right shoulder end-stage arthritis.  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  PROCEDURE PERFORMED:  Right shoulder reverse total shoulder arthroplasty using DePuy Delta Xtend prosthesis with subscapularis repair.  ATTENDING SURGEON:  Doran Heater. Veverly Fells, MD  ASSISTANT:  Charletta Cousin [place], [place], who was scrubbed during the entire procedure, and necessary for satisfactory completion of surgery.  ANESTHESIA:  General anesthesia was used plus interscalene block.  ESTIMATED BLOOD LOSS:  200 mL.  FLUID REPLACEMENT:  1500 mL crystalloid.  COUNTS:  Instrument counts correct.  COMPLICATIONS:  No complications.  ANTIBIOTICS:  Perioperative antibiotics were given.  INDICATIONS:  The patient is a 70 year old male with worsening right shoulder pain and dysfunction secondary to end-stage arthritis, bone-on-bone with rotator cuff dysfunction and some interstitial tearing.  We discussed options for treatment including  anatomic versus reverse total shoulder arthroplasty, due to the patient's declining function and concern of rotator cuff tendons and function, we elected to proceed with reverse shoulder replacement.  Informed consent obtained.  DESCRIPTION OF PROCEDURE:  After an adequate level of anesthesia was achieved, the patient was positioned in modified beach chair position.  Right shoulder correctly identified and sterilely prepped and draped in the usual manner.  Timeout called,  verifying correct patient, correct site.  We utilized a standard deltopectoral approach to the anterior shoulder using a 10 blade scalpel starting at the coracoid process extending down to the anterior humerus.  Dissection down through  subcutaneous  tissues using Bovie.  Cephalic vein was identified and taken laterally, the deltoid pectoralis taken medially.  Conjoined tendon identified and retracted medially.  Deep retractors were placed.  We tenodesed the biceps in situ with 0 Vicryl  figure-of-eight suture x2 incorporating part of the pectoralis tendon.  We then released the subscapularis subperiosteally off the lesser tuberosity and tagged for repair. At the end we used #2 FiberWire suture in a modified Mason-Allen suture technique.   Next, with the inferior capsule released, we extended the shoulder delivering the humeral head out of the wound.  The humeral head was devoid of all cartilage.  We entered the proximal humerus with a 6 mm reamer, reaming up to a size 10 and then using  the 10 mm T-handle guide and resected the head at 20 degrees of retroversion, with the oscillating saw at 155.  Once we had that cut complete, we removed excess osteophytes with a rongeur.  We then subluxed the humerus posteriorly, gaining good exposure  of the glenoid face.  We removed the capsule and labrum and placed our deep retractors.  We found our center point for a guide pin and placed our guide pin for the metaglene reamer.  We then reamed for the metaglene down to subchondral bone.  We then  drilled our central peg hole after doing peripheral hand reaming.  Once we had that central peg drilled out we impacted the real HA coated press-fit baseplate into position.  It was centered well and well supported with bone.  We placed a 42 screw  inferiorly, a 36 at the base of coracoid and 18 anteriorly.  We just used 3 screws.  We had great baseplate security.  We then selected a 42 standard glenosphere and attached that to the baseplate with a screwdriver.  I  did a finger sweep to make sure we  had no soft tissue caught up in that bearing.  We then went to the humeral side and reamed for the 2 right metaphysis.  We then trialled with a 10 stem, 2 right  metaphysis impacting that into 20 degrees of retroversion and then we went ahead and trialed  with a 42+3 poly.  We were very happy with our soft tissue balancing and stability.  Removed all trial components.  We irrigated thoroughly.  We then went ahead and used available bone graft from the humeral head and used the HA coated press-fit a size  10 stem with 2 right metaphysis set on the 0 setting and impacted in 20 degrees of retroversion with bone graft, we had great stem stability.  We went ahead and selected the real 42+3 poly impacted on the humeral tray, reduced the shoulder.  Nice little  pop as it reduced and excellent stability with appropriate conjoined tensioning.  We irrigated thoroughly and then we repaired the subscapularis anatomically back to bone with #2 FiberWire suture.  We had a good repair and did not restrict motion, good  stability.  We then went ahead and irrigated again and repaired the deltopectoral interval with 0 Vicryl suture followed by 2-0 Vicryl for subcutaneous closure and 4-0 Monocryl for skin.  Steri-Strips applied followed by sterile dressing.  The patient  tolerated surgery well.   PUS D: 09/06/2022 12:36:46 pm T: 09/06/2022 3:02:00 pm  JOB: [id number]

## 2022-09-06 NOTE — Interval H&P Note (Signed)
History and Physical Interval Note:  09/06/2022 10:03 AM  Duane Greene  has presented today for surgery, with the diagnosis of Right shoulder end stage osteoarthritis.  The various methods of treatment have been discussed with the patient and family. After consideration of risks, benefits and other options for treatment, the patient has consented to  Procedure(s) with comments: REVERSE Right SHOULDER ARTHROPLASTY (Right) - 120 hours choice and interscalene block as a surgical intervention.  The patient's history has been reviewed, patient examined, no change in status, stable for surgery.  I have reviewed the patient's chart and labs.  Questions were answered to the patient's satisfaction.     Augustin Schooling

## 2022-09-06 NOTE — Transfer of Care (Signed)
Immediate Anesthesia Transfer of Care Note  Patient: Duane Greene  Procedure(s) Performed: REVERSE Right SHOULDER ARTHROPLASTY (Right: Shoulder)  Patient Location: PACU  Anesthesia Type:General  Level of Consciousness: drowsy  Airway & Oxygen Therapy: Patient Spontanous Breathing and Patient connected to face mask oxygen  Post-op Assessment: Report given to RN and Post -op Vital signs reviewed and stable  Post vital signs: Reviewed and stable  Last Vitals:  Vitals Value Taken Time  BP 124/67   Temp    Pulse 63 09/06/22 1231  Resp 19 09/06/22 1231  SpO2 100 % 09/06/22 1231  Vitals shown include unvalidated device data.  Last Pain:  Vitals:   09/06/22 0905  TempSrc:   PainSc: 0-No pain         Complications: No notable events documented.

## 2022-09-06 NOTE — Brief Op Note (Signed)
09/06/2022  12:31 PM  PATIENT:  Duane Greene  71 y.o. male  PRE-OPERATIVE DIAGNOSIS:  Right shoulder end stage osteoarthritis  POST-OPERATIVE DIAGNOSIS:  Right shoulder end stage osteoarthritis, rotator cuff tears  PROCEDURE: Right Reverse Total Shoulder Arthroplasty, DePuy Delta Xtend with Subscap repair  SURGEON:  Surgeon(s) and Role:    Netta Cedars, MD - Primary  PHYSICIAN ASSISTANT:   ASSISTANTS: [place]ura Bruns, PA-C   ANESTHESIA:   regional and general  EBL:  200 mL   BLOOD ADMINISTERED:none  DRAINS: none   LOCAL MEDICATIONS USED:  MARCAINE     SPECIMEN:  No Specimen  DISPOSITION OF SPECIMEN:  N/A  COUNTS:  YES  TOURNIQUET:  * No tourniquets in log *  DICTATION: .Other Dictation: Dictation Number [id number]  PLAN OF CARE: Admit to Obs  PATIENT DISPOSITION:  PACU - hemodynamically stable.   Delay start of Pharmacological VTE agent (>24hrs) due to surgical blood loss or risk of bleeding: not applicable

## 2022-09-06 NOTE — Anesthesia Postprocedure Evaluation (Signed)
Anesthesia Post Note  Patient: Duane Greene  Procedure(s) Performed: REVERSE Right SHOULDER ARTHROPLASTY (Right: Shoulder)     Patient location during evaluation: PACU Anesthesia Type: General Level of consciousness: awake and alert Pain management: pain level controlled Vital Signs Assessment: post-procedure vital signs reviewed and stable Respiratory status: spontaneous breathing, nonlabored ventilation and respiratory function stable Cardiovascular status: blood pressure returned to baseline and stable Postop Assessment: no apparent nausea or vomiting Anesthetic complications: no   No notable events documented.  Last Vitals:  Vitals:   09/06/22 1400 09/06/22 1424  BP: 124/67 135/64  Pulse: 66 65  Resp: (!) 22   Temp:  36.8 C  SpO2: 92% 97%    Last Pain:  Vitals:   09/06/22 1424  TempSrc: Oral  PainSc:                  [name]

## 2022-09-06 NOTE — Anesthesia Preprocedure Evaluation (Signed)
Anesthesia Evaluation  Patient identified by MRN, date of birth, ID band Patient awake    Reviewed: Allergy & Precautions, NPO status , Patient's Chart, lab work & pertinent test results  History of Anesthesia Complications Negative for: history of anesthetic complications  Airway Mallampati: II  TM Distance: >3 FB Neck ROM: Full    Dental no notable dental hx. (+) Dental Advisory Given   Pulmonary sleep apnea and Continuous Positive Airway Pressure Ventilation ,    Pulmonary exam normal        Cardiovascular hypertension, Pt. on medications negative cardio ROS Normal cardiovascular exam     Neuro/Psych negative neurological ROS  negative psych ROS   GI/Hepatic negative GI ROS, Neg liver ROS,   Endo/Other  negative endocrine ROS  Renal/GU negative Renal ROS  negative genitourinary   Musculoskeletal  (+) Arthritis , Osteoarthritis,    Abdominal (+)  Abdomen: soft. Bowel sounds: normal.  Peds negative pediatric ROS (+)  Hematology negative hematology ROS (+)   Anesthesia Other Findings   Reproductive/Obstetrics negative OB ROS                             Anesthesia Physical  Anesthesia Plan  ASA: II  Anesthesia Plan: General   Post-op Pain Management: Regional block* and Minimal or no pain anticipated   Induction: Intravenous  PONV Risk Score and Plan: 2 and Ondansetron, Treatment may vary due to age or medical condition and Midazolam  Airway Management Planned: Oral ETT  Additional Equipment:   Intra-op Plan:   Post-operative Plan: Extubation in OR  Informed Consent: I have reviewed the patients History and Physical, chart, labs and discussed the procedure including the risks, benefits and alternatives for the proposed anesthesia with the patient or authorized representative who has indicated his/her understanding and acceptance.     Dental advisory given  Plan  Discussed with: Anesthesiologist and CRNA  Anesthesia Plan Comments:         Anesthesia Quick Evaluation

## 2022-09-06 NOTE — Anesthesia Procedure Notes (Signed)
Anesthesia Regional Block: Interscalene brachial plexus block   Pre-Anesthetic Checklist: , timeout performed,  Correct Patient, Correct Site, Correct Laterality,  Correct Procedure, Correct Position, site marked,  Risks and benefits discussed,  Surgical consent,  Pre-op evaluation,  At surgeon's request and post-op pain management  Laterality: Right  Prep: chloraprep       Needles:  Injection technique: Single-shot  Needle Type: Stimiplex     Needle Length: 9cm  Needle Gauge: 21     Additional Needles:   Procedures:,,,, ultrasound used (permanent image in chart),,    Narrative:  Start time: 09/06/2022 9:05 AM End time: 09/06/2022 9:10 AM Injection made incrementally with aspirations every 5 mL.  Performed by: Personally  Anesthesiologist: [name], MD

## 2022-09-06 NOTE — Discharge Instructions (Signed)
Ice to the shoulder constantly.  Keep the incision covered and clean and dry for one week, then ok to get it wet in the shower.  Do exercise as instructed several times per day.  DO NOT reach behind your back or push up out of a chair with the operative arm.  Use a sling while you are up and around for comfort, may remove while seated.  Keep pillow propped behind the operative elbow.  Follow up with Dr Veverly Fells in two weeks in the office, call [phone] for appt   Call Dr Veverly Fells' cell phone with any other questions, [phone]

## 2022-09-06 NOTE — Anesthesia Procedure Notes (Signed)
Procedure Name: Intubation Date/Time: 09/06/2022 10:37 AM  Performed by: [name], MDPre-anesthesia Checklist: Patient identified and Emergency Drugs available Patient Re-evaluated:Patient Re-evaluated prior to induction Oxygen Delivery Method: Circle system utilized Preoxygenation: Pre-oxygenation with 100% oxygen Induction Type: IV induction Ventilation: Mask ventilation without difficulty Laryngoscope Size: Mac and 4 Grade View: Grade III Tube type: Oral Tube size: 7.5 mm Number of attempts: 2 Airway Equipment and Method: Bougie stylet Placement Confirmation: ETT inserted through vocal cords under direct vision, positive ETCO2 and breath sounds checked- equal and bilateral Secured at: 23 cm Tube secured with: Tape Dental Injury: Teeth and Oropharynx as per pre-operative assessment  Comments: DL by CRNA Grade 3 view. Unable to pass tube. DL by MDA, grade 3 view. Bougie stylet used to pass tube.

## 2022-09-07 DIAGNOSIS — M19011 Primary osteoarthritis, right shoulder: Secondary | ICD-10-CM | POA: Diagnosis not present

## 2022-09-07 NOTE — Progress Notes (Signed)
Orthopedic Tech Progress Note Patient Details:  Duane Greene 07/16/1951 [id number]  Ortho Devices Type of Ortho Device: Sling immobilizer Ortho Device/Splint Interventions: Application   Post Interventions Patient Tolerated: Well Instructions Provided: Care of device, Adjustment of device  [place] Pink 09/07/2022, 10:49 AM

## 2022-09-07 NOTE — TOC Progression Note (Signed)
Transition of Care [facility]) - Progression Note    Patient Details  Name: Rafik L Martinique MRN: [id number] Date of Birth: Apr 14, 1951  Transition of Care [facility]) CM/SW Contact  Henrietta Dine, RN Phone Number: 09/07/2022, 10:11 AM  Clinical Narrative:     Transition of Care [facility]) Screening Note   Patient Details  Name: French L Martinique Date of Birth: 27-Sep-1951   Transition of Care [facility]) CM/SW Contact:    Henrietta Dine, RN Phone Number: 09/07/2022, 10:11 AM    Transition of Care Department [facility]) has reviewed patient and no TOC needs have been identified at this time. We will continue to monitor patient advancement through interdisciplinary progression rounds. If new patient transition needs arise, please place a TOC consult.          Expected Discharge Plan and Services           Expected Discharge Date: 09/07/22                                     Social Determinants of Health (SDOH) Interventions    Readmission Risk Interventions     No data to display

## 2022-09-07 NOTE — Evaluation (Signed)
Occupational Therapy Evaluation Patient Details Name: Duane Greene MRN: [id number] DOB: 10-11-1951 Today's Date: 09/07/2022   History of Present Illness Mr Greene is s/p REVERSE Right SHOULDER ARTHROPLASTY (Right) per Dr Veverly Fells   Clinical Impression      OT eval and education complete. Handouts provided.   Recommendations for follow up therapy are one component of a multi-disciplinary discharge planning process, led by the attending physician.  Recommendations may be updated based on patient status, additional functional criteria and insurance authorization.   Follow Up Recommendations  Follow physician's recommendations for discharge plan and follow up therapies    Assistance Recommended at Discharge PRN  Patient can return home with the following A little help with bathing/dressing/bathroom             Precautions / Restrictions Precautions Precautions: Shoulder Shoulder Interventions: Shoulder sling/immobilizer Precaution Comments: no shoulder movement at this time.  AROM elbow, wrist and hand allowed Required Braces or Orthoses: Sling Restrictions Weight Bearing Restrictions: Yes RUE Weight Bearing: Non weight bearing      Mobility Bed Mobility Overal bed mobility: Independent                  Transfers Overall transfer level: Independent                            ADL either performed or assessed with clinical judgement      Vision Patient Visual Report: No change from baseline              Pertinent Vitals/Pain Pain Assessment Pain Assessment: No/denies pain           Communication     Cognition Arousal/Alertness: Awake/alert Behavior During Therapy: WFL for tasks assessed/performed Overall Cognitive Status: Within Functional Limits for tasks assessed                                          Exercises Shoulder Exercises Elbow Flexion: AAROM, 10 reps Elbow Extension: AAROM, 10 reps Wrist Flexion:  AAROM, 10 reps Wrist Extension: AAROM, 10 reps Digit Composite Flexion: AAROM, 5 reps Composite Extension: AAROM, 5 reps   Shoulder Instructions Shoulder Instructions Donning/doffing shirt without moving shoulder: Minimal assistance;Patient able to independently direct caregiver Method for sponge bathing under operated UE: Minimal assistance;Patient able to independently direct caregiver Donning/doffing sling/immobilizer: Minimal assistance;Patient able to independently direct caregiver Correct positioning of sling/immobilizer: Minimal assistance;Patient able to independently direct caregiver ROM for elbow, wrist and digits of operated UE: Minimal assistance;Patient able to independently direct caregiver Proper positioning of operated UE when showering: Minimal assistance;Patient able to independently direct caregiver Positioning of UE while sleeping: Minimal assistance;Patient able to independently direct caregiver    Home Living Family/patient expects to be discharged to:: Private residence   Available Help at Discharge: Family;Available 24 hours/day                                     OT Goals(Current goals can be found in the care plan section) Acute Rehab OT Goals Patient Stated Goal: home today OT Goal Formulation: With patient  OT Frequency:         AM-PAC OT "6 Clicks" Daily Activity     Outcome Measure Help from another person eating meals?: None Help from another person  taking care of personal grooming?: None Help from another person toileting, which includes using toliet, bedpan, or urinal?: A Little Help from another person bathing (including washing, rinsing, drying)?: A Little Help from another person to put on and taking off regular upper body clothing?: A Little Help from another person to put on and taking off regular lower body clothing?: A Little 6 Click Score: 20   End of Session Nurse Communication: Mobility status  Activity Tolerance:  Patient tolerated treatment well Patient left: in chair;with call bell/phone within reach                   Time: 9983-3825 OT Time Calculation (min): 15 min Charges:  OT General Charges $OT Visit: 1 Visit OT Evaluation $OT Eval Low Complexity: 1 Low  Lise Auer, OT Acute Rehabilitation Services [phone] Office- [phone]     Aundray Cartlidge, Karin Golden D 09/07/2022, 12:34 PM

## 2022-09-07 NOTE — Discharge Summary (Signed)
In most cases prophylactic antibiotics for Dental procdeures after total joint surgery are not necessary.  Exceptions are as follows:  1. History of prior total joint infection  2. Severely immunocompromised (Organ Transplant, cancer chemotherapy, Rheumatoid biologic meds such as Humera)  3. Poorly controlled diabetes (A1C &gt; 8.0, blood glucose over 200)  If you have one of these conditions, contact your surgeon for an antibiotic prescription, prior to your dental procedure. Orthopedic Discharge Summary        Physician Discharge Summary  Patient ID: Duane Greene MRN: [id number] DOB/AGE: July 29, 1951 71 y.o.  Admit date: 09/06/2022 Discharge date: 09/07/2022   Procedures:  Procedure(s) (LRB): REVERSE Right SHOULDER ARTHROPLASTY (Right)  Attending Physician:  Dr. Malon Kindle  Admission Diagnoses:   right shoulder OA  Discharge Diagnoses:  same   Past Medical History:  Diagnosis Date   Arthritis    History of kidney stones    Hyperlipidemia    Hypertension    Right inguinal hernia    Sleep apnea    uses CPAP nightly    PCP: Emilio Aspen, MD   Discharged Condition: good  Hospital Course:  Patient underwent the above stated procedure on 09/06/2022. Patient tolerated the procedure well and brought to the recovery room in good condition and subsequently to the floor. Patient had an uncomplicated hospital course and was stable for discharge.   Disposition: Discharge disposition: 01-Home or Self Care      with follow up in 2 weeks    Follow-up Information     Beverely Low, MD. Call in 2 week(s).   Specialty: Orthopedic Surgery Why: call [phone] for appt in two weeks Contact information: [address] [place] [address] [phone]                 Dental Antibiotics:  In most cases prophylactic antibiotics for Dental procdeures after total joint surgery are not necessary.  Exceptions are as  follows:  1. History of prior total joint infection  2. Severely immunocompromised (Organ Transplant, cancer chemotherapy, Rheumatoid biologic meds such as Humera)  3. Poorly controlled diabetes (A1C &gt; 8.0, blood glucose over 200)  If you have one of these conditions, contact your surgeon for an antibiotic prescription, prior to your dental procedure.  Discharge Instructions     Call MD / Call 911   Complete by: As directed    If you experience chest pain or shortness of breath, CALL 911 and be transported to the hospital emergency room.  If you develope a fever above 101 F, pus (white drainage) or increased drainage or redness at the wound, or calf pain, call your surgeon's office.   Call MD / Call 911   Complete by: As directed    If you experience chest pain or shortness of breath, CALL 911 and be transported to the hospital emergency room.  If you develope a fever above 101 F, pus (white drainage) or increased drainage or redness at the wound, or calf pain, call your surgeon's office.   Constipation Prevention   Complete by: As directed    Drink plenty of fluids.  Prune juice may be helpful.  You may use a stool softener, such as Colace (over the counter) 100 mg twice a day.  Use MiraLax (over the counter) for constipation as needed.   Constipation Prevention   Complete by: As directed    Drink plenty of fluids.  Prune juice may be helpful.  You may use a stool  softener, such as Colace (over the counter) 100 mg twice a day.  Use MiraLax (over the counter) for constipation as needed.   Diet - low sodium heart healthy   Complete by: As directed    Increase activity slowly as tolerated   Complete by: As directed    Increase activity slowly as tolerated   Complete by: As directed    Post-operative opioid taper instructions:   Complete by: As directed    POST-OPERATIVE OPIOID TAPER INSTRUCTIONS: It is important to wean off of your opioid medication as soon as possible. If you do  not need pain medication after your surgery it is ok to stop day one. Opioids include: Codeine, Hydrocodone(Norco, Vicodin), Oxycodone(Percocet, oxycontin) and hydromorphone amongst others.  Long term and even short term use of opiods can cause: Increased pain response Dependence Constipation Depression Respiratory depression And more.  Withdrawal symptoms can include Flu like symptoms Nausea, vomiting And more Techniques to manage these symptoms Hydrate well Eat regular healthy meals Stay active Use relaxation techniques(deep breathing, meditating, yoga) Do Not substitute Alcohol to help with tapering If you have been on opioids for less than two weeks and do not have pain than it is ok to stop all together.  Plan to wean off of opioids This plan should start within one week post op of your joint replacement. Maintain the same interval or time between taking each dose and first decrease the dose.  Cut the total daily intake of opioids by one tablet each day Next start to increase the time between doses. The last dose that should be eliminated is the evening dose.      Post-operative opioid taper instructions:   Complete by: As directed    POST-OPERATIVE OPIOID TAPER INSTRUCTIONS: It is important to wean off of your opioid medication as soon as possible. If you do not need pain medication after your surgery it is ok to stop day one. Opioids include: Codeine, Hydrocodone(Norco, Vicodin), Oxycodone(Percocet, oxycontin) and hydromorphone amongst others.  Long term and even short term use of opiods can cause: Increased pain response Dependence Constipation Depression Respiratory depression And more.  Withdrawal symptoms can include Flu like symptoms Nausea, vomiting And more Techniques to manage these symptoms Hydrate well Eat regular healthy meals Stay active Use relaxation techniques(deep breathing, meditating, yoga) Do Not substitute Alcohol to help with tapering If  you have been on opioids for less than two weeks and do not have pain than it is ok to stop all together.  Plan to wean off of opioids This plan should start within one week post op of your joint replacement. Maintain the same interval or time between taking each dose and first decrease the dose.  Cut the total daily intake of opioids by one tablet each day Next start to increase the time between doses. The last dose that should be eliminated is the evening dose.          Allergies as of 09/07/2022   No Known Allergies      Medication List     STOP taking these medications    predniSONE 10 MG tablet Commonly known as: DELTASONE       TAKE these medications    aspirin EC 81 MG tablet Take 81 mg by mouth at bedtime.   Boostrix 5-2.5-18.5 LF-MCG/0.5 injection Generic drug: Tdap Inject into the muscle.   cholecalciferol 25 MCG (1000 UNIT) tablet Commonly known as: VITAMIN D3 Take 1,000 Units by mouth daily.   doxycycline 100  MG capsule Commonly known as: VIBRAMYCIN Take 1 capsule (100 mg total) by mouth daily.   losartan-hydrochlorothiazide 50-12.5 MG tablet Commonly known as: HYZAAR Take 1 tablet by mouth once daily (1 tablet Orally Once a day 90 days)   methocarbamol 500 MG tablet Commonly known as: ROBAXIN Take 1 tablet (500 mg total) by mouth 4 (four) times daily.   multivitamin with minerals tablet Take 1 tablet by mouth at bedtime.   NON FORMULARY Pt uses a cpap nightly   ondansetron 4 MG tablet Commonly known as: Zofran Take 1 tablet (4 mg total) by mouth every 8 (eight) hours as needed for nausea, vomiting or refractory nausea / vomiting.   oxyCODONE-acetaminophen 5-325 MG tablet Commonly known as: Percocet Take 1 tablet by mouth every 4 (four) hours as needed for severe pain.   Pfizer COVID-19 Vac Bivalent injection Generic drug: COVID-19 mRNA bivalent vaccine Therapist, music) Inject into the muscle.   simvastatin 40 MG tablet Commonly known as:  ZOCOR Take 40 mg by mouth at bedtime. What changed: Another medication with the same name was removed. Continue taking this medication, and follow the directions you see here.   terbinafine 250 MG tablet Commonly known as: LAMISIL Take 1 tablet (250 mg total) by mouth once a week to prevent onychomycosis  (nail fungus).          Signed: Augustin Schooling 09/07/2022, 8:58 AM  [facility] is now [facility] [address], [place], [place], [place] [address] Phone: [place]

## 2022-09-07 NOTE — Plan of Care (Signed)
  Problem: Pain Management: Goal: Pain level will decrease with appropriate interventions Outcome: Progressing   Problem: Education: Goal: Knowledge of General Education information will improve Description: Including pain rating scale, medication(s)/side effects and non-pharmacologic comfort measures Outcome: Progressing   Problem: Clinical Measurements: Goal: Respiratory complications will improve Outcome: Progressing   Problem: Clinical Measurements: Goal: Cardiovascular complication will be avoided Outcome: Progressing   

## 2022-09-07 NOTE — Plan of Care (Signed)
  Problem: Pain Management: Goal: Pain level will decrease with appropriate interventions Outcome: Progressing   

## 2022-09-07 NOTE — Progress Notes (Signed)
Subjective: 1 Day Post-Op Procedure(s) (LRB): REVERSE Right SHOULDER ARTHROPLASTY (Right) Patient reports pain as 3 on 0-10 scale.   Denies CP or SOB.  Voiding without difficulty. Positive flatus.no sleep Objective: Vital signs in last 24 hours: Temp:  [97.5 F (36.4 C)-98.4 F (36.9 C)] 98.4 F (36.9 C) (10/21 0500) Pulse Rate:  [63-79] 76 (10/21 0500) Resp:  [15-25] 18 (10/21 0500) BP: (105-135)/(56-69) 120/59 (10/21 0500) SpO2:  [92 %-98 %] 94 % (10/21 0500)  Intake/Output from previous day: 10/20 0701 - 10/21 0700 In: 2457.3 [P.O.:600; I.V.:1457.3; IV Piggyback:400] Out: 650 [Urine:450; Blood:200] Intake/Output this shift: Total I/O In: 460 [P.O.:360; IV Piggyback:100] Out: -   No results for input(s): "HGB" in the last 72 hours. No results for input(s): "WBC", "RBC", "HCT", "PLT" in the last 72 hours. No results for input(s): "NA", "K", "CL", "CO2", "BUN", "CREATININE", "GLUCOSE", "CALCIUM" in the last 72 hours. No results for input(s): "LABPT", "INR" in the last 72 hours.  Neurologically intact ABD soft Neurovascular intact Intact pulses distally Incision: dressing C/D/I  Assessment/Plan:  1 Day Post-Op Procedure(s) (LRB): REVERSE Right SHOULDER ARTHROPLASTY (Right) Advance diet Up with therapy Discharge home with home health   Principal Problem:   S/P shoulder replacement, right      Johnn Hai 09/07/2022, @[facility] 

## 2022-09-07 NOTE — Plan of Care (Signed)
Pt stable at this time. No needs at this time.  

## 2022-09-09 ENCOUNTER — Encounter (HOSPITAL_COMMUNITY): Payer: Self-pay | Admitting: Orthopedic Surgery

## 2022-09-13 ENCOUNTER — Other Ambulatory Visit (HOSPITAL_BASED_OUTPATIENT_CLINIC_OR_DEPARTMENT_OTHER): Payer: Self-pay

## 2022-09-19 DIAGNOSIS — Z4789 Encounter for other orthopedic aftercare: Secondary | ICD-10-CM | POA: Diagnosis not present

## 2022-10-02 DIAGNOSIS — E782 Mixed hyperlipidemia: Secondary | ICD-10-CM | POA: Diagnosis not present

## 2022-10-02 DIAGNOSIS — G4733 Obstructive sleep apnea (adult) (pediatric): Secondary | ICD-10-CM | POA: Diagnosis not present

## 2022-10-02 DIAGNOSIS — E559 Vitamin D deficiency, unspecified: Secondary | ICD-10-CM | POA: Diagnosis not present

## 2022-10-02 DIAGNOSIS — Z23 Encounter for immunization: Secondary | ICD-10-CM | POA: Diagnosis not present

## 2022-10-02 DIAGNOSIS — I10 Essential (primary) hypertension: Secondary | ICD-10-CM | POA: Diagnosis not present

## 2022-10-02 DIAGNOSIS — Z1331 Encounter for screening for depression: Secondary | ICD-10-CM | POA: Diagnosis not present

## 2022-10-02 DIAGNOSIS — Z79899 Other long term (current) drug therapy: Secondary | ICD-10-CM | POA: Diagnosis not present

## 2022-10-02 DIAGNOSIS — Z125 Encounter for screening for malignant neoplasm of prostate: Secondary | ICD-10-CM | POA: Diagnosis not present

## 2022-10-02 DIAGNOSIS — H4720 Unspecified optic atrophy: Secondary | ICD-10-CM | POA: Diagnosis not present

## 2022-10-02 DIAGNOSIS — Z1211 Encounter for screening for malignant neoplasm of colon: Secondary | ICD-10-CM | POA: Diagnosis not present

## 2022-10-02 DIAGNOSIS — E669 Obesity, unspecified: Secondary | ICD-10-CM | POA: Diagnosis not present

## 2022-10-02 DIAGNOSIS — Z0001 Encounter for general adult medical examination with abnormal findings: Secondary | ICD-10-CM | POA: Diagnosis not present

## 2022-10-02 DIAGNOSIS — M5416 Radiculopathy, lumbar region: Secondary | ICD-10-CM | POA: Diagnosis not present

## 2022-10-02 DIAGNOSIS — Z0000 Encounter for general adult medical examination without abnormal findings: Secondary | ICD-10-CM | POA: Diagnosis not present

## 2022-10-03 DIAGNOSIS — M25511 Pain in right shoulder: Secondary | ICD-10-CM | POA: Diagnosis not present

## 2022-10-08 ENCOUNTER — Other Ambulatory Visit (HOSPITAL_BASED_OUTPATIENT_CLINIC_OR_DEPARTMENT_OTHER): Payer: Self-pay

## 2022-10-08 DIAGNOSIS — M25511 Pain in right shoulder: Secondary | ICD-10-CM | POA: Diagnosis not present

## 2022-10-08 MED ORDER — LOSARTAN POTASSIUM-HCTZ 50-12.5 MG PO TABS
1.0000 | ORAL_TABLET | Freq: Every day | ORAL | 3 refills | Status: DC
  Filled 2022-10-08: qty 90, 90d supply, fill #0
  Filled 2023-04-21: qty 90, 90d supply, fill #1
  Filled 2023-07-28: qty 90, 90d supply, fill #2

## 2022-10-08 MED ORDER — SIMVASTATIN 40 MG PO TABS
40.0000 mg | ORAL_TABLET | Freq: Every evening | ORAL | 3 refills | Status: DC
  Filled 2022-10-08: qty 90, 90d supply, fill #0
  Filled 2023-07-28: qty 90, 90d supply, fill #1

## 2022-10-17 DIAGNOSIS — Z4789 Encounter for other orthopedic aftercare: Secondary | ICD-10-CM | POA: Diagnosis not present

## 2022-10-17 DIAGNOSIS — M25562 Pain in left knee: Secondary | ICD-10-CM | POA: Diagnosis not present

## 2022-10-17 DIAGNOSIS — M25511 Pain in right shoulder: Secondary | ICD-10-CM | POA: Diagnosis not present

## 2022-10-18 ENCOUNTER — Other Ambulatory Visit (HOSPITAL_BASED_OUTPATIENT_CLINIC_OR_DEPARTMENT_OTHER): Payer: Self-pay

## 2022-10-18 MED ORDER — FLUAD QUADRIVALENT 0.5 ML IM PRSY
PREFILLED_SYRINGE | INTRAMUSCULAR | 0 refills | Status: DC
  Filled 2022-10-18: qty 0.5, 1d supply, fill #0

## 2022-10-22 DIAGNOSIS — M25511 Pain in right shoulder: Secondary | ICD-10-CM | POA: Diagnosis not present

## 2022-10-25 DIAGNOSIS — M25511 Pain in right shoulder: Secondary | ICD-10-CM | POA: Diagnosis not present

## 2022-10-28 DIAGNOSIS — M25511 Pain in right shoulder: Secondary | ICD-10-CM | POA: Diagnosis not present

## 2022-10-31 DIAGNOSIS — M25511 Pain in right shoulder: Secondary | ICD-10-CM | POA: Diagnosis not present

## 2022-11-04 DIAGNOSIS — M25512 Pain in left shoulder: Secondary | ICD-10-CM | POA: Diagnosis not present

## 2022-11-07 DIAGNOSIS — M25512 Pain in left shoulder: Secondary | ICD-10-CM | POA: Diagnosis not present

## 2022-11-20 DIAGNOSIS — M25512 Pain in left shoulder: Secondary | ICD-10-CM | POA: Diagnosis not present

## 2022-11-22 DIAGNOSIS — M25512 Pain in left shoulder: Secondary | ICD-10-CM | POA: Diagnosis not present

## 2022-11-22 DIAGNOSIS — M25511 Pain in right shoulder: Secondary | ICD-10-CM | POA: Diagnosis not present

## 2022-12-19 ENCOUNTER — Other Ambulatory Visit (HOSPITAL_BASED_OUTPATIENT_CLINIC_OR_DEPARTMENT_OTHER): Payer: Self-pay

## 2022-12-19 MED ORDER — TRIAMCINOLONE ACETONIDE 0.1 % EX CREA
1.0000 | TOPICAL_CREAM | Freq: Every day | CUTANEOUS | 3 refills | Status: DC
  Filled 2022-12-19: qty 15, 15d supply, fill #0
  Filled 2023-01-20: qty 15, 15d supply, fill #1
  Filled 2023-11-13: qty 15, 15d supply, fill #2

## 2023-01-02 ENCOUNTER — Other Ambulatory Visit (HOSPITAL_BASED_OUTPATIENT_CLINIC_OR_DEPARTMENT_OTHER): Payer: Self-pay

## 2023-01-02 MED ORDER — SIMVASTATIN 40 MG PO TABS
40.0000 mg | ORAL_TABLET | Freq: Every evening | ORAL | 3 refills | Status: DC
  Filled 2023-01-02: qty 100, 100d supply, fill #0

## 2023-01-02 MED ORDER — LOSARTAN POTASSIUM-HCTZ 50-12.5 MG PO TABS
1.0000 | ORAL_TABLET | Freq: Every day | ORAL | 3 refills | Status: DC
  Filled 2023-01-02: qty 100, 100d supply, fill #0

## 2023-01-24 ENCOUNTER — Other Ambulatory Visit (HOSPITAL_BASED_OUTPATIENT_CLINIC_OR_DEPARTMENT_OTHER): Payer: Self-pay

## 2023-02-02 ENCOUNTER — Inpatient Hospital Stay (HOSPITAL_BASED_OUTPATIENT_CLINIC_OR_DEPARTMENT_OTHER)
Admission: EM | Admit: 2023-02-02 | Discharge: 2023-02-04 | DRG: 175 | Disposition: A | Discharge status: Home / Self Care | Payer: PPO | Attending: Internal Medicine | Admitting: Internal Medicine

## 2023-02-02 ENCOUNTER — Emergency Department (HOSPITAL_BASED_OUTPATIENT_CLINIC_OR_DEPARTMENT_OTHER): Payer: PPO

## 2023-02-02 ENCOUNTER — Other Ambulatory Visit: Payer: Self-pay

## 2023-02-02 ENCOUNTER — Encounter (HOSPITAL_BASED_OUTPATIENT_CLINIC_OR_DEPARTMENT_OTHER): Payer: Self-pay

## 2023-02-02 DIAGNOSIS — Z96611 Presence of right artificial shoulder joint: Secondary | ICD-10-CM | POA: Diagnosis present

## 2023-02-02 DIAGNOSIS — R9431 Abnormal electrocardiogram [ECG] [EKG]: Secondary | ICD-10-CM | POA: Diagnosis not present

## 2023-02-02 DIAGNOSIS — I2699 Other pulmonary embolism without acute cor pulmonale: Secondary | ICD-10-CM | POA: Diagnosis not present

## 2023-02-02 DIAGNOSIS — J9811 Atelectasis: Secondary | ICD-10-CM | POA: Diagnosis present

## 2023-02-02 DIAGNOSIS — G4730 Sleep apnea, unspecified: Secondary | ICD-10-CM | POA: Diagnosis present

## 2023-02-02 DIAGNOSIS — Z66 Do not resuscitate: Secondary | ICD-10-CM | POA: Diagnosis present

## 2023-02-02 DIAGNOSIS — Z7982 Long term (current) use of aspirin: Secondary | ICD-10-CM

## 2023-02-02 DIAGNOSIS — J90 Pleural effusion, not elsewhere classified: Secondary | ICD-10-CM | POA: Diagnosis not present

## 2023-02-02 DIAGNOSIS — G4733 Obstructive sleep apnea (adult) (pediatric): Secondary | ICD-10-CM | POA: Diagnosis not present

## 2023-02-02 DIAGNOSIS — I10 Essential (primary) hypertension: Secondary | ICD-10-CM | POA: Diagnosis not present

## 2023-02-02 DIAGNOSIS — I2510 Atherosclerotic heart disease of native coronary artery without angina pectoris: Secondary | ICD-10-CM | POA: Diagnosis present

## 2023-02-02 DIAGNOSIS — E876 Hypokalemia: Secondary | ICD-10-CM | POA: Diagnosis not present

## 2023-02-02 DIAGNOSIS — E785 Hyperlipidemia, unspecified: Secondary | ICD-10-CM | POA: Diagnosis present

## 2023-02-02 DIAGNOSIS — I2609 Other pulmonary embolism with acute cor pulmonale: Secondary | ICD-10-CM | POA: Diagnosis present

## 2023-02-02 DIAGNOSIS — Z1152 Encounter for screening for COVID-19: Secondary | ICD-10-CM

## 2023-02-02 DIAGNOSIS — I2602 Saddle embolus of pulmonary artery with acute cor pulmonale: Secondary | ICD-10-CM | POA: Diagnosis not present

## 2023-02-02 DIAGNOSIS — R0602 Shortness of breath: Secondary | ICD-10-CM | POA: Diagnosis not present

## 2023-02-02 DIAGNOSIS — I700 Atherosclerosis of aorta: Secondary | ICD-10-CM | POA: Diagnosis present

## 2023-02-02 DIAGNOSIS — I82401 Acute embolism and thrombosis of unspecified deep veins of right lower extremity: Secondary | ICD-10-CM | POA: Diagnosis not present

## 2023-02-02 DIAGNOSIS — Z79899 Other long term (current) drug therapy: Secondary | ICD-10-CM | POA: Diagnosis not present

## 2023-02-02 LAB — COMPREHENSIVE METABOLIC PANEL
ALT: 36 U/L (ref 0–44)
AST: 36 U/L (ref 15–41)
Albumin: 3.8 g/dL (ref 3.5–5.0)
Alkaline Phosphatase: 77 U/L (ref 38–126)
Anion gap: 11 (ref 5–15)
BUN: 24 mg/dL — ABNORMAL HIGH (ref 8–23)
CO2: 26 mmol/L (ref 22–32)
Calcium: 9.5 mg/dL (ref 8.9–10.3)
Chloride: 100 mmol/L (ref 98–111)
Creatinine, Ser: 0.99 mg/dL (ref 0.61–1.24)
GFR, Estimated: >60 mL/min (ref >60)
Glucose, Bld: 105 mg/dL — ABNORMAL HIGH (ref 70–99)
Potassium: 3.6 mmol/L (ref 3.5–5.1)
Sodium: 137 mmol/L (ref 135–145)
Total Bilirubin: 1.9 mg/dL — ABNORMAL HIGH (ref 0.3–1.2)
Total Protein: 7.7 g/dL (ref 6.5–8.1)

## 2023-02-02 LAB — CBC WITH DIFFERENTIAL/PLATELET
Abs Immature Granulocytes: 0.03 10*3/uL (ref 0.00–0.07)
Basophils Absolute: 0.1 10*3/uL (ref 0.0–0.1)
Basophils Relative: 1 %
Eosinophils Absolute: 0 10*3/uL (ref 0.0–0.5)
Eosinophils Relative: 0 %
HCT: 42.3 % (ref 39.0–52.0)
Hemoglobin: 14.8 g/dL (ref 13.0–17.0)
Immature Granulocytes: 0 %
Lymphocytes Relative: 19 %
Lymphs Abs: 1.9 10*3/uL (ref 0.7–4.0)
MCH: 31.9 pg (ref 26.0–34.0)
MCHC: 35 g/dL (ref 30.0–36.0)
MCV: 91.2 fL (ref 80.0–100.0)
Monocytes Absolute: 1.5 10*3/uL — ABNORMAL HIGH (ref 0.1–1.0)
Monocytes Relative: 15 %
Neutro Abs: 6.8 10*3/uL (ref 1.7–7.7)
Neutrophils Relative %: 65 %
Platelets: 210 10*3/uL (ref 150–400)
RBC: 4.64 MIL/uL (ref 4.22–5.81)
RDW: 13 % (ref 11.5–15.5)
WBC: 10.3 10*3/uL (ref 4.0–10.5)
nRBC: 0 % (ref 0.0–0.2)

## 2023-02-02 LAB — BRAIN NATRIURETIC PEPTIDE: B Natriuretic Peptide: 38.6 pg/mL (ref 0.0–100.0)

## 2023-02-02 LAB — RESP PANEL BY RT-PCR (RSV, FLU A&B, COVID)  RVPGX2
Influenza A by PCR: NEGATIVE
Influenza B by PCR: NEGATIVE
Resp Syncytial Virus by PCR: NEGATIVE
SARS Coronavirus 2 by RT PCR: NEGATIVE

## 2023-02-02 LAB — TROPONIN I (HIGH SENSITIVITY)
Troponin I (High Sensitivity): 5 ng/L (ref <18)
Troponin I (High Sensitivity): 5 ng/L (ref <18)

## 2023-02-02 LAB — D-DIMER, QUANTITATIVE: D-Dimer, Quant: 0.66 ug/mL-FEU — ABNORMAL HIGH (ref 0.00–0.50)

## 2023-02-02 MED ORDER — HEPARIN BOLUS VIA INFUSION
6000.0000 [IU] | Freq: Once | INTRAVENOUS | Status: AC
  Administered 2023-02-02: 6000 [IU] via INTRAVENOUS

## 2023-02-02 MED ORDER — HEPARIN (PORCINE) 25000 UT/250ML-% IV SOLN
1850.0000 [IU]/h | INTRAVENOUS | Status: AC
  Administered 2023-02-02: 1600 [IU]/h via INTRAVENOUS
  Administered 2023-02-03: 1850 [IU]/h via INTRAVENOUS
  Filled 2023-02-02 (×3): qty 250

## 2023-02-02 MED ORDER — IOHEXOL 350 MG/ML SOLN
100.0000 mL | Freq: Once | INTRAVENOUS | Status: AC | PRN
  Administered 2023-02-02: 100 mL via INTRAVENOUS

## 2023-02-02 NOTE — ED Provider Notes (Signed)
[facility] Provider Note   CSN: [id number] Arrival date & time: 02/02/23  1921     History  Chief Complaint  Patient presents with   Shortness of Breath    Duane Greene is a 72 y.o. male.  Patient here with shortness of breath with exertion since yesterday.  No chest pain.  History of high blood pressure.  Cardiac history in the family.  He is not had any recent surgery or travel.  Nothing makes it better except for rest.  No smoking history.  No cough or sputum production.  No recent illness.  The history is provided by the patient.       Home Medications Prior to Admission medications   Medication Sig Start Date End Date Taking? Authorizing Provider  aspirin 81 MG EC tablet Take 81 mg by mouth at bedtime.     [provider]  cholecalciferol (VITAMIN D3) 25 MCG (1000 UNIT) tablet Take 1,000 Units by mouth daily.    [provider]  COVID-19 mRNA bivalent vaccine, Pfizer, (PFIZER COVID-19 VAC BIVALENT) injection Inject into the muscle. Patient not taking: Reported on 08/30/2022 09/28/21     doxycycline (VIBRAMYCIN) 100 MG capsule Take 1 capsule (100 mg total) by mouth daily. Patient not taking: Reported on 08/30/2022 10/02/21     influenza vaccine adjuvanted (FLUAD QUADRIVALENT) 0.5 ML injection Inject into the muscle. 10/18/22     losartan-hydrochlorothiazide (HYZAAR) 50-12.5 MG tablet Take 1 tablet by mouth daily. 10/08/22     losartan-hydrochlorothiazide (HYZAAR) 50-12.5 MG tablet Take 1 tablet by mouth daily. 01/02/23     methocarbamol (ROBAXIN) 500 MG tablet Take 1 tablet (500 mg total) by mouth 4 (four) times daily. 09/06/22   Netta Cedars, MD  Multiple Vitamins-Minerals (MULTIVITAMIN WITH MINERALS) tablet Take 1 tablet by mouth at bedtime.    [provider]  NON FORMULARY Pt uses a cpap nightly    [provider]  ondansetron (ZOFRAN) 4 MG tablet Take 1 tablet (4 mg total) by mouth every  8 (eight) hours as needed for nausea, vomiting or refractory nausea / vomiting. 09/06/22 09/06/23  Netta Cedars, MD  oxyCODONE-acetaminophen (PERCOCET) 5-325 MG tablet Take 1 tablet by mouth every 4 (four) hours as needed for severe pain. 09/06/22 09/06/23  Netta Cedars, MD  simvastatin (ZOCOR) 40 MG tablet Take 40 mg by mouth at bedtime.    [provider]  simvastatin (ZOCOR) 40 MG tablet Take 1 tablet (40 mg total) by mouth every evening. 10/08/22     simvastatin (ZOCOR) 40 MG tablet Take 1 tablet (40 mg total) by mouth every evening. 01/02/23     Tdap (BOOSTRIX) 5-2.5-18.5 LF-MCG/0.5 injection Inject into the muscle. Patient not taking: Reported on 08/30/2022 09/28/21     terbinafine (LAMISIL) 250 MG tablet Take 1 tablet (250 mg total) by mouth once a week to prevent onychomycosis  (nail fungus). 08/07/22     triamcinolone cream (KENALOG) 0.1 % Apply 1 Application topically daily as needed 12/19/22         Allergies    Patient has no known allergies.    Review of Systems   Review of Systems  Physical Exam Updated Vital Signs BP (!) 156/65   Pulse 83   Temp 99.4 F (37.4 C) (Temporal)   Resp 20   Ht 5\' 10"  (1.778 m)   Wt 90.7 kg   SpO2 96%   BMI 28.70 kg/m  Physical Exam Vitals and nursing note reviewed.  Constitutional:      General: He is not in acute distress.    Appearance: He is well-developed.  HENT:     Head: Normocephalic and atraumatic.     Mouth/Throat:     Mouth: Mucous membranes are moist.  Eyes:     Conjunctiva/sclera: Conjunctivae normal.     Pupils: Pupils are equal, round, and reactive to light.  Cardiovascular:     Rate and Rhythm: Normal rate and regular rhythm.     Pulses: Normal pulses.     Heart sounds: Normal heart sounds. No murmur heard. Pulmonary:     Effort: Pulmonary effort is normal. No respiratory distress.     Breath sounds: Normal breath sounds. No decreased breath sounds, wheezing, rhonchi or rales.  Abdominal:     Palpations:  Abdomen is soft.     Tenderness: There is no abdominal tenderness.  Musculoskeletal:        General: No swelling.     Cervical back: Normal range of motion and neck supple.     Right lower leg: Edema present.     Left lower leg: Edema present.     Comments: Trace edema in his legs  Skin:    General: Skin is warm and dry.     Capillary Refill: Capillary refill takes less than 2 seconds.  Neurological:     General: No focal deficit present.     Mental Status: He is alert.  Psychiatric:        Mood and Affect: Mood normal.     ED Results / Procedures / Treatments   Labs (all labs ordered are listed, but only abnormal results are displayed) Labs Reviewed  CBC WITH DIFFERENTIAL/PLATELET - Abnormal; Notable for the following components:      Result Value   Monocytes Absolute 1.5 (*)    All other components within normal limits  COMPREHENSIVE METABOLIC PANEL - Abnormal; Notable for the following components:   Glucose, Bld 105 (*)    BUN 24 (*)    Total Bilirubin 1.9 (*)    All other components within normal limits  D-DIMER, QUANTITATIVE - Abnormal; Notable for the following components:   D-Dimer, Quant 0.66 (*)    All other components within normal limits  RESP PANEL BY RT-PCR (RSV, FLU A&B, COVID)  RVPGX2  BRAIN NATRIURETIC PEPTIDE  TROPONIN I (HIGH SENSITIVITY)  TROPONIN I (HIGH SENSITIVITY)    EKG EKG Interpretation  Date/Time:  Sunday February 02 2023 19:32:58 EDT Ventricular Rate:  82 PR Interval:  192 QRS Duration: 101 QT Interval:  387 QTC Calculation: 452 R Axis:   -2 Text Interpretation: Sinus rhythm Confirmed by Lennice Sites ([id number]) on 02/02/2023 7:43:43 PM  Radiology CT Angio Chest PE W and/or Wo Contrast  Result Date: 02/02/2023 CLINICAL DATA:  Pulmonary embolism (PE) suspected, high prob. Shortness of breath. EXAM: CT ANGIOGRAPHY CHEST WITH CONTRAST TECHNIQUE: Multidetector CT imaging of the chest was performed using the standard protocol during bolus  administration of intravenous contrast. Multiplanar CT image reconstructions and MIPs were obtained to evaluate the vascular anatomy. RADIATION DOSE REDUCTION: This exam was performed according to the departmental dose-optimization program which includes automated exposure control, adjustment of the mA and/or kV according to patient size and/or use of iterative reconstruction technique. CONTRAST:  168mL OMNIPAQUE IOHEXOL 350 MG/ML SOLN COMPARISON:  None Available. FINDINGS: Cardiovascular: Filling defects within anterior right upper lobe pulmonary arterial branches compatible with pulmonary emboli. Pulmonary embolus also noted in the right lower lobe posterior branch. No pulmonary  emboli visualized on the left. Heart is normal size. Mild elevation of the RV/LV ratio measuring 1.05 suggesting right heart strain. Scattered coronary artery and aortic calcifications. No aneurysm. Mediastinum/Nodes: No mediastinal, hilar, or axillary adenopathy. Trachea and esophagus are unremarkable. Thyroid unremarkable. Lungs/Pleura: Small right pleural effusion. Airspace disease in the right lower lobe may reflect pulmonary infarct. Linear areas of atelectasis or scarring in the left lung base. No left effusion. Upper Abdomen: No acute findings Musculoskeletal: Chest wall soft tissues are unremarkable. No acute bony abnormality. Review of the MIP images confirms the above findings. IMPRESSION: Pulmonary emboli in the right upper lobe and right lower lobe. CT evidence of right heart strain (RV/LV Ratio = 1.05) consistent with at least submassive (intermediate risk) PE. The presence of right heart strain has been associated with an increased risk of morbidity and mortality. Please refer to the "Code PE Focused" order set in EPIC. Airspace opacity in the right lower lobe may reflect pulmonary infarct. Small right pleural effusion. Coronary artery disease, aortic atherosclerosis. These results were called by telephone at the time of  interpretation on 02/02/2023 at 9:10 pm to provider Katelan Hirt , who verbally acknowledged these results. Electronically Signed   By: Rolm Baptise M.D.   On: 02/02/2023 21:12   DG Chest Portable 1 View  Result Date: 02/02/2023 CLINICAL DATA:  Shortness of breath for 3 days. EXAM: PORTABLE CHEST 1 VIEW COMPARISON:  08/24/2020. FINDINGS: The heart size and mediastinal contours are within normal limits. There is atherosclerotic calcification of the aorta. Lung volumes are low with atelectasis or infiltrate at the lung bases. No effusion or pneumothorax. Right shoulder arthroplasty changes are noted. IMPRESSION: Low lung volumes with atelectasis or infiltrate at the lung bases. Electronically Signed   By: Brett Fairy M.D.   On: 02/02/2023 20:03    Procedures .Critical Care  Performed by: Lennice Sites, DO Authorized by: Lennice Sites, DO   Critical care provider statement:    Critical care time (minutes):  35   Critical care was necessary to treat or prevent imminent or life-threatening deterioration of the following conditions:  Respiratory failure   Critical care was time spent personally by me on the following activities:  Blood draw for specimens, development of treatment plan with patient or surrogate, discussions with primary provider, evaluation of patient's response to treatment, examination of patient, obtaining history from patient or surrogate, ordering and review of laboratory studies, ordering and performing treatments and interventions, re-evaluation of patient's condition, pulse oximetry and ordering and review of radiographic studies   I assumed direction of critical care for this patient from another provider in my specialty: no       Medications Ordered in ED Medications  iohexol (OMNIPAQUE) 350 MG/ML injection 100 mL (100 mLs Intravenous Contrast Given 02/02/23 2053)    ED Course/ Medical Decision Making/ A&P                             Medical Decision Making Amount  and/or Complexity of Data Reviewed Labs: ordered. Radiology: ordered.  Risk Prescription drug management. Decision regarding hospitalization.   Duane Greene is here with shortness of breath.  History of high cholesterol, high blood pressure.  Normal vitals.  No fever.  Differential diagnosis ACS versus PE versus pneumonia versus anemia versus volume overload.  Will get CBC, BMP, troponin, D-dimer, chest x-ray, COVID and flu test and reevaluate.  No wheezing on exam.  Overall well-appearing otherwise.  Overall lab from my review interpretation is unremarkable.  Troponin within normal limits.  BNP within normal limits.  D-dimer age-adjusted normal however we will still pursue CT scan to rule out PE, pneumonia given they be pneumonia on chest x-ray.  He is visibly short of breath and CT scan did in fact show pulmonary emboli in the right upper lobe and right lower lobe.  There is some mild right heart strain.  However his hemodynamics are normal.  At this time we will start patient on IV heparin bolus and infusion admitted to medicine for further care.  Does not have any chest pain.  Continues to have some shortness of breath but vital signs are normal.  This chart was dictated using voice recognition software.  Despite best efforts to proofread,  errors can occur which can change the documentation meaning.         Final Clinical Impression(s) / ED Diagnoses Final diagnoses:  Acute pulmonary embolism, unspecified pulmonary embolism type, unspecified whether acute cor pulmonale present [facility])    Rx / DC Orders ED Discharge Orders     None         Lennice Sites, DO 02/02/23 2127

## 2023-02-02 NOTE — Progress Notes (Signed)
ANTICOAGULATION CONSULT NOTE - Initial Consult  Pharmacy Consult for Heparin Indication: pulmonary embolus  No Known Allergies  Patient Measurements: Height: 5\' 10"  (177.8 cm) Weight: 90.7 kg (200 lb) IBW/kg (Calculated) : 73 Heparin Dosing Weight: 90.7 kg  Vital Signs: Temp: 99.4 F (37.4 C) (03/17 1935) Temp Source: Temporal (03/17 1935) BP: 156/65 (03/17 1930) Pulse Rate: 83 (03/17 1930)  Labs: Recent Labs    02/02/23 1947  HGB 14.8  HCT 42.3  PLT 210  CREATININE 0.99  TROPONINIHS 5    Estimated Creatinine Clearance: 75.3 mL/min (by C-G formula based on SCr of 0.99 mg/dL).   Medical History: Past Medical History:  Diagnosis Date   Arthritis    History of kidney stones    Hyperlipidemia    Hypertension    Right inguinal hernia    Sleep apnea    uses CPAP nightly    Medications:  (Not in a hospital admission)  Scheduled:  Infusions:  PRN:   Assessment: 43 yom with a history of HLD, sleep apnea. Patient is presenting with SOB since yesterday. Heparin per pharmacy consult placed for pulmonary embolus.  Patient is not on anticoagulation prior to arrival.  Hgb 14.8; plt 210  Goal of Therapy:  Heparin level 0.3-0.7 units/ml Monitor platelets by anticoagulation protocol: Yes   Plan:  Give IV heparin 6000 units bolus x 1 Start heparin infusion at 1600 units/hr Check anti-Xa level at 0500 and daily while on heparin Continue to monitor H&H and platelets  Lorelei Pont, PharmD, BCPS 02/02/2023 9:42 PM ED Clinical Pharmacist -  [phone]

## 2023-02-02 NOTE — Progress Notes (Signed)
Plan of Care Note for accepted transfer   Patient: Duane Greene MRN: [id number]   [place]: 02/02/2023  Facility requesting transfer: [facility] Requesting Provider: Lennice Sites, DO  Reason for transfer: Acute submassive PE Facility course: Duane Greene is a 72 y.o. male, coming with shortness of breath with exertion since yesterday.  No chest pain.  History of high blood pressure.  Cardiac history in the family.  He is not had any recent surgery or travel.  Nothing makes it better except for rest.  No smoking history.  No cough or sputum production.  No recent illness.  When the patient came to the ER, BP was 156/65 with otherwise normal vital signs.  Labs revealed a BUN of 24, total bili of 1.9 with otherwise unremarkable CMP.  CBC was within normal.  High-sensitivity troponin I was 5 twice.  D-dimer was 0.66.  Influenza, COVID and RSV PCR's came back negative.  Portable chest x-ray showed low lung volumes with atelectasis or infiltrate at the lung bases.   Chest CTA revealed the following: Pulmonary emboli in the right upper lobe and right lower lobe. CT evidence of right heart strain (RV/LV Ratio = 1.05) consistent with at least submassive (intermediate risk) PE. The presence of right heart strain has been associated with an increased risk of morbidity and mortality.    Airspace opacity in the right lower lobe may reflect pulmonary infarct. Small right pleural effusion.   Coronary artery disease, aortic atherosclerosis.  The patient was given IV heparin bolus and drip.  Contact was made with Dr. Elsworth Soho with PCCM who recommended continuing heparin and with hemodynamic instability to be consulted.    Plan of care: The patient is accepted for admission to Progressive unit, at [facility]   The patient will be under the care and responsibility of the ER physician until transferred to [facility].  Author: Christel Mormon, MD 02/02/2023  Check [email or web address] for on-call  coverage.  Nursing staff, Please call [place] number on Amion as soon as patient's arrival, so appropriate admitting provider can evaluate the pt.

## 2023-02-02 NOTE — ED Triage Notes (Signed)
Patient here POV from Home   Endorses SOB for approximately 3 Days. Worsens somewhat with Exertion.   No CP. Mild Cough. No fever.  NAD Noted during Triage. A&Ox4. GCS 15. Ambulatory.

## 2023-02-03 ENCOUNTER — Encounter (HOSPITAL_COMMUNITY): Payer: Self-pay | Admitting: Family Medicine

## 2023-02-03 ENCOUNTER — Inpatient Hospital Stay (HOSPITAL_COMMUNITY): Payer: PPO

## 2023-02-03 DIAGNOSIS — E785 Hyperlipidemia, unspecified: Secondary | ICD-10-CM | POA: Diagnosis present

## 2023-02-03 DIAGNOSIS — I2510 Atherosclerotic heart disease of native coronary artery without angina pectoris: Secondary | ICD-10-CM | POA: Diagnosis present

## 2023-02-03 DIAGNOSIS — I2609 Other pulmonary embolism with acute cor pulmonale: Principal | ICD-10-CM

## 2023-02-03 DIAGNOSIS — I2602 Saddle embolus of pulmonary artery with acute cor pulmonale: Secondary | ICD-10-CM

## 2023-02-03 DIAGNOSIS — Z7982 Long term (current) use of aspirin: Secondary | ICD-10-CM | POA: Diagnosis not present

## 2023-02-03 DIAGNOSIS — G4733 Obstructive sleep apnea (adult) (pediatric): Secondary | ICD-10-CM | POA: Diagnosis present

## 2023-02-03 DIAGNOSIS — Z96611 Presence of right artificial shoulder joint: Secondary | ICD-10-CM | POA: Diagnosis present

## 2023-02-03 DIAGNOSIS — J9811 Atelectasis: Secondary | ICD-10-CM | POA: Diagnosis present

## 2023-02-03 DIAGNOSIS — I10 Essential (primary) hypertension: Secondary | ICD-10-CM | POA: Diagnosis present

## 2023-02-03 DIAGNOSIS — Z1152 Encounter for screening for COVID-19: Secondary | ICD-10-CM | POA: Diagnosis not present

## 2023-02-03 DIAGNOSIS — Z79899 Other long term (current) drug therapy: Secondary | ICD-10-CM | POA: Diagnosis not present

## 2023-02-03 DIAGNOSIS — E876 Hypokalemia: Secondary | ICD-10-CM | POA: Diagnosis present

## 2023-02-03 DIAGNOSIS — Z66 Do not resuscitate: Secondary | ICD-10-CM | POA: Diagnosis present

## 2023-02-03 DIAGNOSIS — G4730 Sleep apnea, unspecified: Secondary | ICD-10-CM | POA: Diagnosis present

## 2023-02-03 DIAGNOSIS — I82401 Acute embolism and thrombosis of unspecified deep veins of right lower extremity: Secondary | ICD-10-CM | POA: Diagnosis present

## 2023-02-03 DIAGNOSIS — I2699 Other pulmonary embolism without acute cor pulmonale: Secondary | ICD-10-CM | POA: Diagnosis not present

## 2023-02-03 DIAGNOSIS — I700 Atherosclerosis of aorta: Secondary | ICD-10-CM | POA: Diagnosis present

## 2023-02-03 LAB — ECHOCARDIOGRAM COMPLETE
Area-P 1/2: 4.15 cm2
Calc EF: 66.3 %
Height: 70 in
S' Lateral: 2.4 cm
Single Plane A2C EF: 69.4 %
Single Plane A4C EF: 62.7 %
Weight: 3200 oz

## 2023-02-03 LAB — HEPARIN LEVEL (UNFRACTIONATED)
Heparin Unfractionated: 0.38 IU/mL (ref 0.30–0.70)
Heparin Unfractionated: 0.23 IU/mL — ABNORMAL LOW (ref 0.30–0.70)
Heparin Unfractionated: 0.41 IU/mL (ref 0.30–0.70)

## 2023-02-03 MED ORDER — LOSARTAN POTASSIUM-HCTZ 50-12.5 MG PO TABS: 1.0000 | ORAL_TABLET | Freq: Every day | ORAL | Status: DC

## 2023-02-03 MED ORDER — ONDANSETRON HCL 4 MG/2ML IJ SOLN: 4.0000 mg | Freq: Four times a day (QID) | INTRAMUSCULAR | Status: DC | PRN

## 2023-02-03 MED ORDER — LOSARTAN POTASSIUM 50 MG PO TABS
50.0000 mg | ORAL_TABLET | Freq: Every day | ORAL | Status: DC
  Administered 2023-02-03 – 2023-02-04 (×2): 50 mg via ORAL
  Filled 2023-02-03 (×2): qty 1

## 2023-02-03 MED ORDER — ACETAMINOPHEN 325 MG PO TABS: 650.0000 mg | ORAL_TABLET | Freq: Four times a day (QID) | ORAL | Status: DC | PRN

## 2023-02-03 MED ORDER — METHOCARBAMOL 500 MG PO TABS
500.0000 mg | ORAL_TABLET | Freq: Four times a day (QID) | ORAL | Status: DC
  Administered 2023-02-03 – 2023-02-04 (×4): 500 mg via ORAL
  Filled 2023-02-03 (×4): qty 1

## 2023-02-03 MED ORDER — ALBUTEROL SULFATE (2.5 MG/3ML) 0.083% IN NEBU: 2.5000 mg | INHALATION_SOLUTION | RESPIRATORY_TRACT | Status: DC | PRN

## 2023-02-03 MED ORDER — HEPARIN BOLUS VIA INFUSION
2000.0000 [IU] | Freq: Once | INTRAVENOUS | Status: AC
  Administered 2023-02-03: 2000 [IU] via INTRAVENOUS
  Filled 2023-02-03: qty 2000

## 2023-02-03 MED ORDER — TEMAZEPAM 7.5 MG PO CAPS: 30.0000 mg | ORAL_CAPSULE | Freq: Every evening | ORAL | Status: DC | PRN

## 2023-02-03 MED ORDER — DOCUSATE SODIUM 100 MG PO CAPS
100.0000 mg | ORAL_CAPSULE | Freq: Two times a day (BID) | ORAL | Status: DC
  Filled 2023-02-03: qty 1

## 2023-02-03 MED ORDER — OXYCODONE HCL 5 MG PO TABS: 5.0000 mg | ORAL_TABLET | ORAL | Status: DC | PRN

## 2023-02-03 MED ORDER — HYDROCHLOROTHIAZIDE 12.5 MG PO TABS
12.5000 mg | ORAL_TABLET | Freq: Every day | ORAL | Status: DC
  Administered 2023-02-03 – 2023-02-04 (×2): 12.5 mg via ORAL
  Filled 2023-02-03 (×2): qty 1

## 2023-02-03 MED ORDER — MORPHINE SULFATE (PF) 2 MG/ML IV SOLN: 2.0000 mg | INTRAVENOUS | Status: DC | PRN

## 2023-02-03 MED ORDER — ACETAMINOPHEN 650 MG RE SUPP: 650.0000 mg | Freq: Four times a day (QID) | RECTAL | Status: DC | PRN

## 2023-02-03 MED ORDER — HYDRALAZINE HCL 20 MG/ML IJ SOLN: 5.0000 mg | INTRAMUSCULAR | Status: DC | PRN

## 2023-02-03 MED ORDER — SIMVASTATIN 20 MG PO TABS
40.0000 mg | ORAL_TABLET | Freq: Every evening | ORAL | Status: DC
  Administered 2023-02-03: 40 mg via ORAL
  Filled 2023-02-03: qty 2

## 2023-02-03 MED ORDER — SODIUM CHLORIDE 0.9% FLUSH: 3.0000 mL | Freq: Two times a day (BID) | INTRAVENOUS | Status: DC

## 2023-02-03 MED ORDER — BISACODYL 5 MG PO TBEC: 5.0000 mg | DELAYED_RELEASE_TABLET | Freq: Every day | ORAL | Status: DC | PRN

## 2023-02-03 MED ORDER — POLYETHYLENE GLYCOL 3350 17 G PO PACK: 17.0000 g | PACK | Freq: Every day | ORAL | Status: DC | PRN

## 2023-02-03 MED ORDER — ONDANSETRON HCL 4 MG PO TABS: 4.0000 mg | ORAL_TABLET | Freq: Four times a day (QID) | ORAL | Status: DC | PRN

## 2023-02-03 NOTE — ED Notes (Signed)
[facility] at bedside to transport pt to [place] 

## 2023-02-03 NOTE — H&P (Signed)
History and Physical    Patient: Duane Greene [id number] DOB: 1951/08/01 DOA: 02/02/2023 DOS: the patient was seen and examined on 02/03/2023 PCP: Kathalene Frames, MD  Patient coming from: Home - lives with wife; NOK:  Wife, [phone]    Chief Complaint: DOE  HPI: Duane Greene is a 72 y.o. male with medical history significant of HTN, HLD, and OSA  presenting with DOE.  He reports that Thursday he was at [facility] and rode 35 miles on IT trainer bike.  Friday, he stayed in bed until 1030 and he went downstairs and he was completely worn out.  They came home yesterday and stopped at [facility].  Throughout the day, it was taking longer for him to recover from SOB.  They drove to [facility] Friday a week ago and came back yesterday.  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  No prior clots to his knowledge.  No known malignancy.  Last vaccine was flu in early winter.  ?mild RSV around Christmas.  No leg symptoms.  No chest pain.    ER Course:  Carryover per Dr. Sidney Ace:  DOE since yesterday.  No chest pain.  No recent surgery or travel.  Nothing makes it better except for rest.  No smoking history.  No cough or sputum production.  No recent illness.  D-dimer was 0.66.  Influenza, COVID and RSV PCR's came back negative.  CXR with low lung volumes with atelectasis or infiltrate at the lung bases.  CTA with RUL/RLL PE with R heart strain. Started on Heparin.  Contact was made with Dr. Elsworth Soho with PCCM who recommended continuing heparin and with hemodynamic instability to be consulted.     Review of Systems: As mentioned in the history of present illness. All other systems reviewed and are negative. Past Medical History:  Diagnosis Date   Arthritis    History of kidney stones    Hyperlipidemia    Hypertension    Right inguinal hernia    Sleep apnea    uses CPAP nightly   Past Surgical History:  Procedure Laterality Date   CHOLECYSTECTOMY N/A 06/13/2020   Procedure:  LAPAROSCOPIC CHOLECYSTECTOMY;  Surgeon: Donnie Mesa, MD;  Location: [place];  Service: General;  Laterality: N/A;   COLONOSCOPY     EXTRACORPOREAL SHOCK WAVE LITHOTRIPSY Left 10/18/2019   Procedure: EXTRACORPOREAL SHOCK WAVE LITHOTRIPSY (ESWL);  Surgeon: Raynelle Bring, MD;  Location: [facility];  Service: Urology;  Laterality: Left;   INGUINAL HERNIA REPAIR Right 06/30/2019   Procedure: RIGHT INGUINAL HERNIA REPAIR WITH MESH;  Surgeon: Donnie Mesa, MD;  Location: [place];  Service: General;  Laterality: Right;   INTRAOPERATIVE CHOLANGIOGRAM N/A 06/13/2020   Procedure: INTRAOPERATIVE CHOLANGIOGRAM;  Surgeon: Donnie Mesa, MD;  Location: [place];  Service: General;  Laterality: N/A;   KNEE ARTHROSCOPY Left    LUMBAR LAMINECTOMY/DECOMPRESSION MICRODISCECTOMY N/A 08/28/2020   Procedure: Laminectomy - Lumbar One-Two, Lumbar Two-Three, Lumbar Three-Four, Lumbar Four-Five, Lumbar Five-Sacral One;  Surgeon: Eustace Moore, MD;  Location: [place];  Service: Neurosurgery;  Laterality: N/A;  posterior   REVERSE SHOULDER ARTHROPLASTY Right 09/06/2022   Procedure: REVERSE Right SHOULDER ARTHROPLASTY;  Surgeon: Netta Cedars, MD;  Location: [facility];  Service: Orthopedics;  Laterality: Right;  120 hours choice and interscalene block   SHOULDER ARTHROSCOPY Bilateral    TONSILLECTOMY     Social History:  reports that he has never smoked. He has never used smokeless tobacco. He reports that he does not drink alcohol and does  not use drugs.  No Known Allergies  History reviewed. No pertinent family history.  Prior to Admission medications   Medication Sig Start Date End Date Taking? Authorizing Provider  aspirin 81 MG EC tablet Take 81 mg by mouth at bedtime.     [provider]  cholecalciferol (VITAMIN D3) 25 MCG (1000 UNIT) tablet Take 1,000 Units by mouth daily.    [provider]  COVID-19 mRNA bivalent vaccine, Pfizer, (PFIZER COVID-19 VAC BIVALENT) injection Inject into  the muscle. Patient not taking: Reported on 08/30/2022 09/28/21     doxycycline (VIBRAMYCIN) 100 MG capsule Take 1 capsule (100 mg total) by mouth daily. Patient not taking: Reported on 08/30/2022 10/02/21     influenza vaccine adjuvanted (FLUAD QUADRIVALENT) 0.5 ML injection Inject into the muscle. 10/18/22     losartan-hydrochlorothiazide (HYZAAR) 50-12.5 MG tablet Take 1 tablet by mouth daily. 10/08/22     losartan-hydrochlorothiazide (HYZAAR) 50-12.5 MG tablet Take 1 tablet by mouth daily. 01/02/23     methocarbamol (ROBAXIN) 500 MG tablet Take 1 tablet (500 mg total) by mouth 4 (four) times daily. 09/06/22   Netta Cedars, MD  Multiple Vitamins-Minerals (MULTIVITAMIN WITH MINERALS) tablet Take 1 tablet by mouth at bedtime.    [provider]  NON FORMULARY Pt uses a cpap nightly    [provider]  ondansetron (ZOFRAN) 4 MG tablet Take 1 tablet (4 mg total) by mouth every 8 (eight) hours as needed for nausea, vomiting or refractory nausea / vomiting. 09/06/22 09/06/23  Netta Cedars, MD  oxyCODONE-acetaminophen (PERCOCET) 5-325 MG tablet Take 1 tablet by mouth every 4 (four) hours as needed for severe pain. 09/06/22 09/06/23  Netta Cedars, MD  simvastatin (ZOCOR) 40 MG tablet Take 40 mg by mouth at bedtime.    [provider]  simvastatin (ZOCOR) 40 MG tablet Take 1 tablet (40 mg total) by mouth every evening. 10/08/22     simvastatin (ZOCOR) 40 MG tablet Take 1 tablet (40 mg total) by mouth every evening. 01/02/23     Tdap (BOOSTRIX) 5-2.5-18.5 LF-MCG/0.5 injection Inject into the muscle. Patient not taking: Reported on 08/30/2022 09/28/21     terbinafine (LAMISIL) 250 MG tablet Take 1 tablet (250 mg total) by mouth once a week to prevent onychomycosis  (nail fungus). 08/07/22     triamcinolone cream (KENALOG) 0.1 % Apply 1 Application topically daily as needed 12/19/22       Physical Exam: Vitals:   02/03/23 0600 02/03/23 0634 02/03/23 0641 02/03/23 0748  BP:  130/60 (!) 137/55  129/60  Pulse: 73 80  83  Resp: 20 (!) 24    Temp:   98.7 F (37.1 C) 98.4 F (36.9 C)  TempSrc:   Oral Oral  SpO2: 97% 97%  94%  Weight:      Height:       General:  Appears calm and comfortable and is in NAD Eyes:  EOMI, normal lids, iris ENT:  grossly normal hearing, lips & tongue, mmm; appropriate dentition Neck:  no LAD, masses or thyromegaly Cardiovascular:  RRR, no m/r/g. No LE edema.  Respiratory:   CTA bilaterally with no wheezes/rales/rhonchi.  Normal respiratory effort.  On Room air. Abdomen:  soft, NT, ND Skin:  no rash or induration seen on limited exam Musculoskeletal:  grossly normal tone BUE/BLE, good ROM, no bony abnormality.  R leg is slightly larger than L at calf region without cords, apparent erythema Psychiatric:  grossly normal mood and affect, speech fluent and appropriate, AOx3 Neurologic:  CN 2-12 grossly intact, moves all extremities in coordinated fashion   Radiological Exams on Admission: Independently reviewed - see discussion in A/P where applicable  ECHOCARDIOGRAM COMPLETE  Result Date: 02/03/2023    ECHOCARDIOGRAM REPORT   Patient Name:   Davinci L Greene Date of Exam: 02/03/2023 Medical Rec #:  [id number]      Height:       70.0 in Accession #:    [id number]     Weight:       200.0 lb Date of Birth:  Nov 15, 1951      BSA:          2.087 m Patient Age:    35 years       BP:           129/60 mmHg Patient Gender: M              HR:           70 bpm. Exam Location:  Inpatient Procedure: 2D Echo, Cardiac Doppler and Color Doppler Indications:    I26.02 Pulmonary embolus  History:        Patient has no prior history of Echocardiogram examinations.                 Risk Factors:Hypertension and Dyslipidemia.  Sonographer:    Phineas Douglas Referring Phys: [place]  1. Left ventricular ejection fraction, by estimation, is 60 to 65%. The left ventricle has normal function. The left ventricle has no regional wall motion  abnormalities. There is mild concentric left ventricular hypertrophy. Left ventricular diastolic parameters were normal.  2. Right ventricular systolic function is normal. The right ventricular size is normal. Tricuspid regurgitation signal is inadequate for assessing PA pressure.  3. The mitral valve is grossly normal. Trivial mitral valve regurgitation. No evidence of mitral stenosis.  4. The aortic valve is tricuspid. Aortic valve regurgitation is not visualized. No aortic stenosis is present. FINDINGS  Left Ventricle: Left ventricular ejection fraction, by estimation, is 60 to 65%. The left ventricle has normal function. The left ventricle has no regional wall motion abnormalities. The left ventricular internal cavity size was normal in size. There is  mild concentric left ventricular hypertrophy. Left ventricular diastolic parameters were normal. Right Ventricle: The right ventricular size is normal. No increase in right ventricular wall thickness. Right ventricular systolic function is normal. Tricuspid regurgitation signal is inadequate for assessing PA pressure. Left Atrium: Left atrial size was normal in size. Right Atrium: Right atrial size was normal in size. Pericardium: There is no evidence of pericardial effusion. Mitral Valve: The mitral valve is grossly normal. Trivial mitral valve regurgitation. No evidence of mitral valve stenosis. Tricuspid Valve: The tricuspid valve is grossly normal. Tricuspid valve regurgitation is trivial. No evidence of tricuspid stenosis. Aortic Valve: The aortic valve is tricuspid. Aortic valve regurgitation is not visualized. No aortic stenosis is present. Pulmonic Valve: The pulmonic valve was grossly normal. Pulmonic valve regurgitation is not visualized. No evidence of pulmonic stenosis. Aorta: The aortic root and ascending aorta are structurally normal, with no evidence of dilitation. Venous: The inferior vena cava was not well visualized. IAS/Shunts: The atrial septum  is grossly normal.  LEFT VENTRICLE PLAX 2D LVIDd:         4.10 cm      Diastology LVIDs:         2.40 cm      LV e' medial:    8.81 cm/s LV PW:  1.30 cm      LV E/e' medial:  9.5 LV IVS:        1.30 cm      LV e' lateral:   10.60 cm/s LVOT diam:     2.20 cm      LV E/e' lateral: 7.9 LV SV:         101 LV SV Index:   48 LVOT Area:     3.80 cm  LV Volumes (MOD) LV vol d, MOD A2C: 112.0 ml LV vol d, MOD A4C: 106.0 ml LV vol s, MOD A2C: 34.3 ml LV vol s, MOD A4C: 39.5 ml LV SV MOD A2C:     77.7 ml LV SV MOD A4C:     106.0 ml LV SV MOD BP:      73.9 ml RIGHT VENTRICLE RV Basal diam:  3.90 cm RV S prime:     15.20 cm/s TAPSE (M-mode): 2.3 cm LEFT ATRIUM           Index        RIGHT ATRIUM           Index LA diam:      3.30 cm 1.58 cm/m   RA Area:     18.90 cm LA Vol (A2C): 35.1 ml 16.82 ml/m  RA Volume:   52.80 ml  25.30 ml/m LA Vol (A4C): 45.4 ml 21.75 ml/m  AORTIC VALVE LVOT Vmax:   127.00 cm/s LVOT Vmean:  91.500 cm/s LVOT VTI:    0.266 m  AORTA Ao Root diam: 3.60 cm Ao Asc diam:  3.40 cm MITRAL VALVE MV Area (PHT): 4.15 cm    SHUNTS MV Decel Time: 183 msec    Systemic VTI:  0.27 m MV E velocity: 84.00 cm/s  Systemic Diam: 2.20 cm MV A velocity: 69.80 cm/s MV E/A ratio:  1.20 Eleonore Chiquito MD Electronically signed by Eleonore Chiquito MD Signature Date/Time: 02/03/2023/9:07:40 AM    Final    CT Angio Chest PE W and/or Wo Contrast  Result Date: 02/02/2023 CLINICAL DATA:  Pulmonary embolism (PE) suspected, high prob. Shortness of breath. EXAM: CT ANGIOGRAPHY CHEST WITH CONTRAST TECHNIQUE: Multidetector CT imaging of the chest was performed using the standard protocol during bolus administration of intravenous contrast. Multiplanar CT image reconstructions and MIPs were obtained to evaluate the vascular anatomy. RADIATION DOSE REDUCTION: This exam was performed according to the departmental dose-optimization program which includes automated exposure control, adjustment of the mA and/or kV according to patient  size and/or use of iterative reconstruction technique. CONTRAST:  129mL OMNIPAQUE IOHEXOL 350 MG/ML SOLN COMPARISON:  None Available. FINDINGS: Cardiovascular: Filling defects within anterior right upper lobe pulmonary arterial branches compatible with pulmonary emboli. Pulmonary embolus also noted in the right lower lobe posterior branch. No pulmonary emboli visualized on the left. Heart is normal size. Mild elevation of the RV/LV ratio measuring 1.05 suggesting right heart strain. Scattered coronary artery and aortic calcifications. No aneurysm. Mediastinum/Nodes: No mediastinal, hilar, or axillary adenopathy. Trachea and esophagus are unremarkable. Thyroid unremarkable. Lungs/Pleura: Small right pleural effusion. Airspace disease in the right lower lobe may reflect pulmonary infarct. Linear areas of atelectasis or scarring in the left lung base. No left effusion. Upper Abdomen: No acute findings Musculoskeletal: Chest wall soft tissues are unremarkable. No acute bony abnormality. Review of the MIP images confirms the above findings. IMPRESSION: Pulmonary emboli in the right upper lobe and right lower lobe. CT evidence of right heart strain (RV/LV Ratio = 1.05) consistent with at least submassive (  intermediate risk) PE. The presence of right heart strain has been associated with an increased risk of morbidity and mortality. Please refer to the "Code PE Focused" order set in EPIC. Airspace opacity in the right lower lobe may reflect pulmonary infarct. Small right pleural effusion. Coronary artery disease, aortic atherosclerosis. These results were called by telephone at the time of interpretation on 02/02/2023 at 9:10 pm to provider ADAM CURATOLO , who verbally acknowledged these results. Electronically Signed   By: Rolm Baptise M.D.   On: 02/02/2023 21:12   DG Chest Portable 1 View  Result Date: 02/02/2023 CLINICAL DATA:  Shortness of breath for 3 days. EXAM: PORTABLE CHEST 1 VIEW COMPARISON:  08/24/2020.  FINDINGS: The heart size and mediastinal contours are within normal limits. There is atherosclerotic calcification of the aorta. Lung volumes are low with atelectasis or infiltrate at the lung bases. No effusion or pneumothorax. Right shoulder arthroplasty changes are noted. IMPRESSION: Low lung volumes with atelectasis or infiltrate at the lung bases. Electronically Signed   By: Brett Fairy M.D.   On: 02/02/2023 20:03    EKG: Independently reviewed.  NSR with rate 82; no evidence of acute ischemia   Labs on Admission: I have personally reviewed the available labs and imaging studies at the time of the admission.  Pertinent labs:    Glucose 105 Bili 1.9 BNP 38.6 HS troponin 5, 5 Normal CBC COVID/flu/RSV negative   Assessment and Plan: Principal Problem:   Acute pulmonary embolism with acute cor pulmonale (HCC) Active Problems:   Hyperlipidemia   Hypertension   Sleep apnea   DNR (do not resuscitate)    Acute PE -Patient without prior episodes of thromboembolic disease presenting with new PE -Patient is not showing evidence of hemodynamic instability at this time -Given his hemodynamic stability, his is at intermediate risk -PESI score is Class II, low risk, indicating a 1.7-3.5% 30-day mortality risk -S-PESI score is low, indicating that the patient has a 1.1% risk of death and a 1.5% risk of recurrent VTE or non-fatal bleeding -Will admit on telemetry  -R heart strain was seen on CT -Troponin and BNP as well as echo have been ordered to assess the severity of clot burden with R heart strain; all are negative, which is quite reassuring -IR should be consulted only if the patient is worsening or not improving. -Initiate anticoagulation - for now, will start treatment-dose heparin with plan to transition to alternative [facility] agent tomorrow -[place] O2 as needed (not needed at this time) -We discussed oral AC treatment options and risks/benefits including frequent MD visits and dietary  regulation with Coumadin vs. Significant expense with DOAC therapy -Will request TOC team consultation to assist with cost analysis of the various DOAC options based on his insurance -Patients are at intermediate risk (3-8%/year) for recurrent VTE if initial clot occurred after long duration of travel.  Extended oral anticoagulation of indefinite duration should be considered for patients with a first episode of PE with these issues. -His wife reports that he has been more sedentary in the last 6 months and given his risk of repeat clot if his ongoing sedentary lifestyle continues, he may require lifelong AC. -The patient understands that thromboembolic disease can be catastrophic and even deadly and that he must be complaint with physician appointments and anticoagulation. -DVT US ordered; IVC filter might need to be considered - particularly if the patient is unable to tolerate OAC therapy  -Hold ASA for now  HTN -Continue Losartan-HCTZ  HLD -  Continue simvastatin  OSA -Continue CPAP  DNR -I have discussed code status with the patient and he would not desire resuscitation and would prefer to die a natural death should that situation arise.     Advance Care Planning:   Code Status: DNR   Consults: PCCM (telephone only); TOC team  DVT Prophylaxis: Heparin  Family Communication: None present; I spoke with his wife at the time of admission  Severity of Illness:  The appropriate patient status for this patient is INPATIENT. Inpatient status is judged to be reasonable and necessary in order to provide the required intensity of service to ensure the patient's safety. The patient's presenting symptoms, physical exam findings, and initial radiographic and laboratory data in the context of their chronic comorbidities is felt to place them at high risk for further clinical deterioration. Furthermore, it is not anticipated that the patient will be medically stable for discharge from the  hospital within 2 midnights of admission.   * I certify that at the point of admission it is my clinical judgment that the patient will require inpatient hospital care spanning beyond 2 midnights from the point of admission due to high intensity of service, high risk for further deterioration and high frequency of surveillance required.*    Author: Karmen Bongo, MD 02/03/2023 2:26 PM  For on call review [email or web address].

## 2023-02-03 NOTE — Care Management (Signed)
TOC acknowledges "Consult to [facility] for DOAC needs".  TOC will continue to follow patient for any additional discharge needs

## 2023-02-03 NOTE — Progress Notes (Signed)
Echocardiogram 2D Echocardiogram has been performed.  Duane Greene 02/03/2023, 8:27 AM

## 2023-02-03 NOTE — ED Notes (Signed)
Patient placed on 2LNC at this time due to desaturations while sleeping. Patient states he has hx of sleep apnea and sleeps with CPAP at home. Patient declined use of CPAP in the ER at this time.

## 2023-02-03 NOTE — Progress Notes (Signed)
ANTICOAGULATION CONSULT NOTE - Initial Consult  Pharmacy Consult for Heparin Indication: pulmonary embolus  No Known Allergies  Patient Measurements: Height: 5\' 10"  (177.8 cm) Weight: 90.7 kg (200 lb) IBW/kg (Calculated) : 73 Heparin Dosing Weight: 90.7 kg  Vital Signs: Temp: 98.7 F (37.1 C) (03/18 0641) Temp Source: Oral (03/18 0641) BP: 137/55 (03/18 0634) Pulse Rate: 80 (03/18 0634)  Labs: Recent Labs    02/02/23 1947 02/02/23 2110 02/03/23 0449  HGB 14.8  --   --   HCT 42.3  --   --   PLT 210  --   --   HEPARINUNFRC  --   --  0.38  CREATININE 0.99  --   --   TROPONINIHS 5 5  --      Estimated Creatinine Clearance: 75.3 mL/min (by C-G formula based on SCr of 0.99 mg/dL).   Medical History: Past Medical History:  Diagnosis Date   Arthritis    History of kidney stones    Hyperlipidemia    Hypertension    Right inguinal hernia    Sleep apnea    uses CPAP nightly    Medications:  Medications Prior to Admission  Medication Sig Dispense Refill Last Dose   aspirin 81 MG EC tablet Take 81 mg by mouth at bedtime.       cholecalciferol (VITAMIN D3) 25 MCG (1000 UNIT) tablet Take 1,000 Units by mouth daily.      losartan-hydrochlorothiazide (HYZAAR) 50-12.5 MG tablet Take 1 tablet by mouth daily. 90 tablet 3    losartan-hydrochlorothiazide (HYZAAR) 50-12.5 MG tablet Take 1 tablet by mouth daily. 100 tablet 3    methocarbamol (ROBAXIN) 500 MG tablet Take 1 tablet (500 mg total) by mouth 4 (four) times daily. 40 tablet 1    Multiple Vitamins-Minerals (MULTIVITAMIN WITH MINERALS) tablet Take 1 tablet by mouth at bedtime.      NON FORMULARY Pt uses a cpap nightly      ondansetron (ZOFRAN) 4 MG tablet Take 1 tablet (4 mg total) by mouth every 8 (eight) hours as needed for nausea, vomiting or refractory nausea / vomiting. 30 tablet 1    oxyCODONE-acetaminophen (PERCOCET) 5-325 MG tablet Take 1 tablet by mouth every 4 (four) hours as needed for severe pain. 20 tablet 0     simvastatin (ZOCOR) 40 MG tablet Take 40 mg by mouth at bedtime.      simvastatin (ZOCOR) 40 MG tablet Take 1 tablet (40 mg total) by mouth every evening. 90 tablet 3    simvastatin (ZOCOR) 40 MG tablet Take 1 tablet (40 mg total) by mouth every evening. 100 tablet 3    terbinafine (LAMISIL) 250 MG tablet Take 1 tablet (250 mg total) by mouth once a week to prevent onychomycosis  (nail fungus). 12 tablet 2    triamcinolone cream (KENALOG) 0.1 % Apply 1 Application topically daily as needed 15 g 3     Scheduled:  Infusions:  PRN:   Assessment: 48 yom with a history of HLD, sleep apnea. Patient is presenting with SOB since yesterday. Heparin per pharmacy consult placed for pulmonary embolus.  Patient is not on anticoagulation prior to arrival.  Hgb 14.8; plt 210  First Heparin level 0.38 on the low end of therapeutic. Will increase slightly  Goal of Therapy:  Heparin level 0.3-0.7 units/ml Monitor platelets by anticoagulation protocol: Yes   Plan:  Increase heparin infusion to 1650 units/hr Check anti-Xa level in 8 hours Continue to monitor H&H and platelets  Jerri Glauser A. Levada Dy,  PharmD, BCPS, FNKF Clinical Pharmacist [place] Please utilize Amion for appropriate phone number to reach the unit pharmacist ([place])

## 2023-02-03 NOTE — Progress Notes (Signed)
ANTICOAGULATION CONSULT NOTE - Follow Up Consult  Pharmacy Consult for heparin Indication: pulmonary embolus  Labs: Recent Labs    02/02/23 1947 02/02/23 2110 02/03/23 0449 02/03/23 1553 02/03/23 2307  HGB 14.8  --   --   --   --   HCT 42.3  --   --   --   --   PLT 210  --   --   --   --   HEPARINUNFRC  --   --  0.38 0.23* 0.41  CREATININE 0.99  --   --   --   --   TROPONINIHS 5 5  --   --   --     Assessment/Plan:  72yo male therapeutic on heparin after rate change. Will continue infusion at current rate of 1850 units/hr and confirm stable with am labs.   Wynona Neat, PharmD, BCPS  02/03/2023,11:58 PM

## 2023-02-03 NOTE — Progress Notes (Signed)
ANTICOAGULATION CONSULT NOTE - Initial Consult  Pharmacy Consult for Heparin Indication: pulmonary embolus  No Known Allergies  Patient Measurements: Height: 5\' 10"  (177.8 cm) Weight: 90.7 kg (200 lb) IBW/kg (Calculated) : 73 Heparin Dosing Weight: 90.7 kg  Vital Signs: Temp: 98.4 F (36.9 C) (03/18 0748) Temp Source: Oral (03/18 0748) BP: 129/60 (03/18 0748) Pulse Rate: 83 (03/18 0748)  Labs: Recent Labs    02/02/23 1947 02/02/23 2110 02/03/23 0449 02/03/23 1553  HGB 14.8  --   --   --   HCT 42.3  --   --   --   PLT 210  --   --   --   HEPARINUNFRC  --   --  0.38 0.23*  CREATININE 0.99  --   --   --   TROPONINIHS 5 5  --   --      Estimated Creatinine Clearance: 75.3 mL/min (by C-G formula based on SCr of 0.99 mg/dL).   Medical History: Past Medical History:  Diagnosis Date   Arthritis    History of kidney stones    Hyperlipidemia    Hypertension    Right inguinal hernia    Sleep apnea    uses CPAP nightly    Medications:  Medications Prior to Admission  Medication Sig Dispense Refill Last Dose   aspirin 81 MG EC tablet Take 81 mg by mouth at bedtime.    02/01/2023   cholecalciferol (VITAMIN D3) 25 MCG (1000 UNIT) tablet Take 1,000 Units by mouth daily.   Past Week   losartan-hydrochlorothiazide (HYZAAR) 50-12.5 MG tablet Take 1 tablet by mouth daily. 90 tablet 3 02/02/2023   methocarbamol (ROBAXIN) 500 MG tablet Take 1 tablet (500 mg total) by mouth 4 (four) times daily. 40 tablet 1 Past Month   Multiple Vitamins-Minerals (MULTIVITAMIN WITH MINERALS) tablet Take 1 tablet by mouth at bedtime.   Past Week   oxyCODONE-acetaminophen (PERCOCET) 5-325 MG tablet Take 1 tablet by mouth every 4 (four) hours as needed for severe pain. 20 tablet 0 > month   simvastatin (ZOCOR) 40 MG tablet Take 1 tablet (40 mg total) by mouth every evening. 90 tablet 3 Past Week   temazepam (RESTORIL) 30 MG capsule Take 30 mg by mouth at bedtime as needed for sleep.   Past Month    terbinafine (LAMISIL) 250 MG tablet Take 1 tablet (250 mg total) by mouth once a week to prevent onychomycosis  (nail fungus). 12 tablet 2 01/26/2023   triamcinolone cream (KENALOG) 0.1 % Apply 1 Application topically daily as needed 15 g 3 Past Month   NON FORMULARY Pt uses a cpap nightly       Scheduled:  Infusions:  PRN:   Assessment: 43 yom with a history of HLD, sleep apnea. Patient is presenting with SOB since yesterday. Heparin per pharmacy consult placed for pulmonary embolus.  Patient is not on anticoagulation prior to arrival.  Hgb 14.8; plt 210  Heparin level came back subtherapeutic this PM. We will increase and check another level.   Goal of Therapy:  Heparin level 0.3-0.7 units/ml Monitor platelets by anticoagulation protocol: Yes   Plan:  Heparin bolus 2000 units x1 Increase heparin infusion to 1850 units/hr Check anti-Xa level in 6 hours Continue to monitor H&H and platelets  Onnie Boer, PharmD, BCIDP, AAHIVP, CPP Infectious Disease Pharmacist 02/03/2023 4:47 PM

## 2023-02-04 ENCOUNTER — Inpatient Hospital Stay (HOSPITAL_COMMUNITY): Payer: PPO

## 2023-02-04 ENCOUNTER — Other Ambulatory Visit (HOSPITAL_COMMUNITY): Payer: Self-pay

## 2023-02-04 DIAGNOSIS — I2699 Other pulmonary embolism without acute cor pulmonale: Secondary | ICD-10-CM | POA: Diagnosis not present

## 2023-02-04 DIAGNOSIS — I2609 Other pulmonary embolism with acute cor pulmonale: Secondary | ICD-10-CM | POA: Diagnosis not present

## 2023-02-04 LAB — BASIC METABOLIC PANEL
Anion gap: 12 (ref 5–15)
BUN: 21 mg/dL (ref 8–23)
CO2: 23 mmol/L (ref 22–32)
Calcium: 8.3 mg/dL — ABNORMAL LOW (ref 8.9–10.3)
Chloride: 99 mmol/L (ref 98–111)
Creatinine, Ser: 0.97 mg/dL (ref 0.61–1.24)
GFR, Estimated: >60 mL/min (ref >60)
Glucose, Bld: 140 mg/dL — ABNORMAL HIGH (ref 70–99)
Potassium: 2.9 mmol/L — ABNORMAL LOW (ref 3.5–5.1)
Sodium: 134 mmol/L — ABNORMAL LOW (ref 135–145)

## 2023-02-04 LAB — CBC
HCT: 37.8 % — ABNORMAL LOW (ref 39.0–52.0)
Hemoglobin: 13.5 g/dL (ref 13.0–17.0)
MCH: 32.5 pg (ref 26.0–34.0)
MCHC: 35.7 g/dL (ref 30.0–36.0)
MCV: 90.9 fL (ref 80.0–100.0)
Platelets: 204 10*3/uL (ref 150–400)
RBC: 4.16 MIL/uL — ABNORMAL LOW (ref 4.22–5.81)
RDW: 12.7 % (ref 11.5–15.5)
WBC: 8.5 10*3/uL (ref 4.0–10.5)
nRBC: 0 % (ref 0.0–0.2)

## 2023-02-04 LAB — HEPARIN LEVEL (UNFRACTIONATED)
Heparin Unfractionated: 0.41 IU/mL (ref 0.30–0.70)
Heparin Unfractionated: 0.47 IU/mL (ref 0.30–0.70)

## 2023-02-04 MED ORDER — APIXABAN 5 MG PO TABS: 5.0000 mg | ORAL_TABLET | Freq: Two times a day (BID) | ORAL | Status: DC

## 2023-02-04 MED ORDER — POTASSIUM CHLORIDE CRYS ER 20 MEQ PO TBCR
40.0000 meq | EXTENDED_RELEASE_TABLET | Freq: Two times a day (BID) | ORAL | Status: DC
  Administered 2023-02-04: 40 meq via ORAL
  Filled 2023-02-04: qty 2

## 2023-02-04 MED ORDER — POTASSIUM CHLORIDE CRYS ER 20 MEQ PO TBCR
40.0000 meq | EXTENDED_RELEASE_TABLET | Freq: Once | ORAL | Status: AC
  Administered 2023-02-04: 40 meq via ORAL
  Filled 2023-02-04: qty 2

## 2023-02-04 MED ORDER — APIXABAN 5 MG PO TABS
5.0000 mg | ORAL_TABLET | Freq: Two times a day (BID) | ORAL | 0 refills | Status: DC
  Filled 2023-02-04: qty 60, 30d supply, fill #0

## 2023-02-04 MED ORDER — APIXABAN 5 MG PO TABS
10.0000 mg | ORAL_TABLET | Freq: Two times a day (BID) | ORAL | Status: DC
  Administered 2023-02-04: 10 mg via ORAL
  Filled 2023-02-04: qty 2

## 2023-02-04 MED ORDER — APIXABAN (ELIQUIS) VTE STARTER PACK (10MG AND 5MG)
10.0000 mg | ORAL_TABLET | Freq: Two times a day (BID) | ORAL | 0 refills | Status: DC
  Filled 2023-02-04: qty 74, 29d supply, fill #0

## 2023-02-04 NOTE — Discharge Instructions (Addendum)
Follow with Primary MD Kathalene Frames, MD in 7 days   Get CBC, CMP  -  checked next visit with your primary MD   Activity: As tolerated with Full fall precautions use walker/cane & assistance as needed  Disposition Home    Diet: Heart Healthy   Special Instructions: If you have smoked or chewed Tobacco  in the last 2 yrs please stop smoking, stop any regular Alcohol  and or any Recreational drug use.  On your next visit with your primary care physician please Get Medicines reviewed and adjusted.  Please request your Prim.MD to go over all Hospital Tests and Procedure/Radiological results at the follow up, please get all Hospital records sent to your Prim MD by signing hospital release before you go home.  If you experience worsening of your admission symptoms, develop shortness of breath, life threatening emergency, suicidal or homicidal thoughts you must seek medical attention immediately by calling 911 or calling your MD immediately  if symptoms less severe.  You Must read complete instructions/literature along with all the possible adverse reactions/side effects for all the Medicines you take and that have been prescribed to you. Take any new Medicines after you have completely understood and accpet all the possible adverse reactions/side effects.  Information on my medicine - ELIQUIS (apixaban)  This medication education was reviewed with me or my healthcare representative as part of my discharge preparation.  The pharmacist that spoke with me during my hospital stay was:  Theotis Burrow, [facility]  Why was Eliquis prescribed for you? Eliquis was prescribed to treat blood clots that may have been found in the veins of your legs (deep vein thrombosis) or in your lungs (pulmonary embolism) and to reduce the risk of them occurring again.  What do You need to know about Eliquis ? The starting dose is 10 mg (two 5 mg tablets) taken TWICE daily for the FIRST SEVEN (7) DAYS, then on  (enter date)  02/11/23  the dose is reduced to ONE 5 mg tablet taken TWICE daily.  Eliquis may be taken with or without food.   Try to take the dose about the same time in the morning and in the evening. If you have difficulty swallowing the tablet whole please discuss with your pharmacist how to take the medication safely.  Take Eliquis exactly as prescribed and DO NOT stop taking Eliquis without talking to the doctor who prescribed the medication.  Stopping may increase your risk of developing a new blood clot.  Refill your prescription before you run out.  After discharge, you should have regular check-up appointments with your healthcare provider that is prescribing your Eliquis.    What do you do if you miss a dose? If a dose of ELIQUIS is not taken at the scheduled time, take it as soon as possible on the same day and twice-daily administration should be resumed. The dose should not be doubled to make up for a missed dose.  Important Safety Information A possible side effect of Eliquis is bleeding. You should call your healthcare provider right away if you experience any of the following: Bleeding from an injury or your nose that does not stop. Unusual colored urine (red or dark brown) or unusual colored stools (red or black). Unusual bruising for unknown reasons. A serious fall or if you hit your head (even if there is no bleeding).  Some medicines may interact with Eliquis and might increase your risk of bleeding or clotting while on Eliquis.  To help avoid this, consult your healthcare provider or pharmacist prior to using any new prescription or non-prescription medications, including herbals, vitamins, non-steroidal anti-inflammatory drugs (NSAIDs) and supplements.  This website has more information on Eliquis (apixaban): http://www.eliquis.com/eliquis/home

## 2023-02-04 NOTE — TOC Progression Note (Addendum)
Transition of Care [facility]) - Progression Note    Patient Details  Name: Duane Greene MRN: [id number] Date of Birth: 02/21/1951  [facility] CM/SW Contact  Levonne Lapping, RN Phone Number: 02/04/2023, 9:55 AM  Clinical Narrative:    Eliquis cards will be tubed to unit by D Swist, CM. This CM is remote due to [place]. Patient's RN will give one to patient and put others in dictation room. TOC will continue to follow patient for any additional discharge needs       Expected Discharge Plan and Services                                               Social Determinants of Health (SDOH) Interventions SDOH Screenings   Food Insecurity: No Food Insecurity (02/03/2023)  Housing: Low Risk  (02/03/2023)  Transportation Needs: No Transportation Needs (02/03/2023)  Utilities: Not At Risk (02/03/2023)  Tobacco Use: Low Risk  (02/03/2023)    Readmission Risk Interventions     No data to display

## 2023-02-04 NOTE — Progress Notes (Signed)
BLE venous duplex has been completed.   Results can be found under chart review under CV PROC. 02/04/2023 11:37 AM Arrionna Serena RVT, RDMS

## 2023-02-04 NOTE — Evaluation (Signed)
Physical Therapy Evaluation Patient Details Name: Duane Greene MRN: [id number] DOB: July 24, 1951 Today's Date: 02/04/2023  History of Present Illness  72 y/o M presenting to [place] ED on 3/17 for SOB, transferred to [facility] on 3/18 as imaging revealed PE. Pt started on heparin. PMHx: HTN, HLD, OSA, shoulder replacement  Clinical Impression  Pt presents today functioning close to his mobility baseline. Pt reports independence with mobility at baseline, no recent falls, and an improvement with his SOB since admission. Pt able to perform all mobility with supervision with minG for stair negotiation for safety but pt performing all mobility without LOB or SOB on room air, SPO2 maintaining 90% or greater. Pt reports no concerns with mobility upon discharge, educated pt on importance of easing back to mobility and using his fatigue and SOB symptoms to guide him, pt verbalizing understanding. Pt with no further benefit from skilled acute PT at this time, recommend return home with no current need for PT at discharge. PT signing off.        Recommendations for follow up therapy are one component of a multi-disciplinary discharge planning process, led by the attending physician.  Recommendations may be updated based on patient status, additional functional criteria and insurance authorization.  Follow Up Recommendations No PT follow up      Assistance Recommended at Discharge Intermittent Supervision/Assistance  Patient can return home with the following  Help with stairs or ramp for entrance;Assist for transportation;Assistance with cooking/housework    Equipment Recommendations None recommended by PT  Recommendations for Other Services       Functional Status Assessment Patient has had a recent decline in their functional status and demonstrates the ability to make significant improvements in function in a reasonable and predictable amount of time.     Precautions / Restrictions  Precautions Precautions: Fall;Other (comment) Precaution Comments: watch O2 Restrictions Weight Bearing Restrictions: No      Mobility  Bed Mobility Overal bed mobility: Needs Assistance Bed Mobility: Supine to Sit     Supine to sit: Supervision, HOB elevated     General bed mobility comments: HOB elevated and supervision provided for line management. Pt ended session in chair.    Transfers Overall transfer level: Needs assistance Equipment used: None Transfers: Sit to/from Stand Sit to Stand: Supervision           General transfer comment: supervision for safety and line management    Ambulation/Gait Ambulation/Gait assistance: Supervision Gait Distance (Feet): 250 Feet Assistive device: None Gait Pattern/deviations: Step-through pattern, Decreased stride length, Drifts right/left Gait velocity: decreased     General Gait Details: mild path deviation and slower gait speed  Stairs Stairs: Yes Stairs assistance: Min guard Stair Management: No rails, One rail Left, Alternating pattern, Forwards Number of Stairs: 4 General stair comments: ascending without rail, descending with mild touch of rail which pt reports he uses support at his house from a beam  Wheelchair Mobility    Modified Rankin (Stroke Patients Only)       Balance Overall balance assessment: Needs assistance Sitting-balance support: No upper extremity supported, Feet supported Sitting balance-Leahy Scale: Good     Standing balance support: No upper extremity supported, During functional activity Standing balance-Leahy Scale: Fair                               Pertinent Vitals/Pain Pain Assessment Pain Assessment: Faces Faces Pain Scale: No hurt    Home Living Family/patient  expects to be discharged to:: Private residence Living Arrangements: Spouse/significant other Available Help at Discharge: Family;Available 24 hours/day Type of Home: House Home Access: Stairs to  enter Entrance Stairs-Rails: None Entrance Stairs-Number of Steps: 2   Home Layout: One level Home Equipment: Conservation officer, nature (2 wheels);Grab bars - tub/shower Additional Comments: pt resides with wife who is retired and can assist as needed. Pt with 2 STE his home with no rails but reports he has a post he is able to use for balance    Prior Function Prior Level of Function : Independent/Modified Independent;Driving             Mobility Comments: independent with all mobility, drives       Hand Dominance        Extremity/Trunk Assessment   Upper Extremity Assessment Upper Extremity Assessment: Overall WFL for tasks assessed    Lower Extremity Assessment Lower Extremity Assessment: Overall WFL for tasks assessed    Cervical / Trunk Assessment Cervical / Trunk Assessment: Normal  Communication   Communication: No difficulties  Cognition Arousal/Alertness: Awake/alert Behavior During Therapy: WFL for tasks assessed/performed Overall Cognitive Status: Within Functional Limits for tasks assessed                                 General Comments: A&Ox4, responds appropriately and follows multi-step commands, asking RN questions about discharge and medications        General Comments General comments (skin integrity, edema, etc.): SPO2 90% or greater on room air, reporting 2/10 fatigued level after ambulation and stair trial    Exercises     Assessment/Plan    PT Assessment Patient does not need any further PT services  PT Problem List         PT Treatment Interventions      PT Goals (Current goals can be found in the Care Plan section)  Acute Rehab PT Goals Patient Stated Goal: go home PT Goal Formulation: All assessment and education complete, DC therapy    Frequency       Co-evaluation               AM-PAC PT "6 Clicks" Mobility  Outcome Measure Help needed turning from your back to your side while in a flat bed without using  bedrails?: None Help needed moving from lying on your back to sitting on the side of a flat bed without using bedrails?: None Help needed moving to and from a bed to a chair (including a wheelchair)?: A Little Help needed standing up from a chair using your arms (e.g., wheelchair or bedside chair)?: A Little Help needed to walk in hospital room?: A Little Help needed climbing 3-5 steps with a railing? : A Little 6 Click Score: 20    End of Session Equipment Utilized During Treatment: Gait belt Activity Tolerance: Patient tolerated treatment well Patient left: in chair;with call bell/phone within reach Nurse Communication: Mobility status PT Visit Diagnosis: Difficulty in walking, not elsewhere classified (R26.2)    Time: [id number] PT Time Calculation (min) (ACUTE ONLY): 23 min   Charges:   PT Evaluation $PT Eval Low Complexity: 1 Low          Charlynne Cousins, PT DPT Acute Rehabilitation Services Office [phone]   Luvenia Heller 02/04/2023, 3:15 PM

## 2023-02-04 NOTE — Discharge Summary (Signed)
Elmer L Martinique [id number] DOB: 1951/05/15 DOA: 02/02/2023  PCP: Kathalene Frames, MD  Admit date: 02/02/2023  Discharge date: 02/04/2023  Admitted From: Home   Disposition:  Home   Recommendations for Outpatient Follow-up:   Follow up with PCP in 1-2 weeks  PCP Please obtain BMP/CBC, 2 view CXR in 1week,  (see Discharge instructions)   PCP Please follow up on the following pending results:    Home Health: None   Equipment/Devices: None  Consultations: None  Discharge Condition: Stable    CODE STATUS: Full    Diet Recommendation: Heart Healthy     Chief Complaint  Patient presents with   Shortness of Breath     Brief history of present illness from the day of admission and additional interim summary    72 y.o. male with medical history significant of HTN, HLD, and OSA  presenting with DOE.  He reports that Thursday he was at [facility] and rode 35 miles on IT trainer bike.  Friday, he stayed in bed until 1030 and he went downstairs and he was completely worn out.  They came home yesterday and stopped at [facility].  Throughout the day, it was taking longer for him to recover from SOB.  They drove to [facility] Friday a week ago and came back yesterday.  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  No prior clots to his knowledge.  No known malignancy.  Last vaccine was flu in early winter.  ?mild RSV around Christmas.  No leg symptoms.  No chest pain.                                                                   Hospital Course   Acute PE likely due to right lower extremity DVT.  Patient had recent long drive to [place] and back, has right lower extremity swelling, CTA noted, echocardiogram reassuring with no strain pattern, was kept on heparin drip for 24 hours now transition to Eliquis will be  discharged.  Although lower extremity venous duplex was unremarkable she did have swelling in the right lower extremity and likely had DVT there which has gone into his lungs giving him the PE.  Follow-up with PCP thereafter.   OSA.  Nighttime CPAP.  Patient compliant.  Hypertension.  Continue home medication which is losartan and HCTZ combination.  Dyslipidemia.  On statin.  Hypokalemia.  Replaced.   Discharge diagnosis     Principal Problem:   Acute pulmonary embolism with acute cor pulmonale (HCC) Active Problems:   Hyperlipidemia   Hypertension   Sleep apnea   DNR (do not resuscitate)    Discharge instructions    Discharge Instructions     Diet - low sodium heart healthy   Complete by: As directed    Discharge instructions  Complete by: As directed    Follow with Primary MD Kathalene Frames, MD in 7 days   Get CBC, CMP  -  checked next visit with your primary MD   Activity: As tolerated with Full fall precautions use walker/cane & assistance as needed  Disposition Home    Diet: Heart Healthy   Special Instructions: If you have smoked or chewed Tobacco  in the last 2 yrs please stop smoking, stop any regular Alcohol  and or any Recreational drug use.  On your next visit with your primary care physician please Get Medicines reviewed and adjusted.  Please request your Prim.MD to go over all Hospital Tests and Procedure/Radiological results at the follow up, please get all Hospital records sent to your Prim MD by signing hospital release before you go home.  If you experience worsening of your admission symptoms, develop shortness of breath, life threatening emergency, suicidal or homicidal thoughts you must seek medical attention immediately by calling 911 or calling your MD immediately  if symptoms less severe.  You Must read complete instructions/literature along with all the possible adverse reactions/side effects for all the Medicines you take and that  have been prescribed to you. Take any new Medicines after you have completely understood and accpet all the possible adverse reactions/side effects.   Increase activity slowly   Complete by: As directed        Discharge Medications   Allergies as of 02/04/2023   No Known Allergies      Medication List     STOP taking these medications    aspirin EC 81 MG tablet       TAKE these medications    Apixaban Starter Pack (10mg  and 5mg ) Commonly known as: ELIQUIS STARTER PACK *Start on Day 2 of Starter Pack* Take 2 tablets (10 mg total) by mouth 2 (two) times daily for 6 days. Then take 1 tablet (5 mg total) by mouth 2 (two) times daily.   apixaban 5 MG Tabs tablet Commonly known as: ELIQUIS Take 1 tablet (5 mg total) by mouth 2 (two) times daily. Start taking on: February 11, 2023   cholecalciferol 25 MCG (1000 UNIT) tablet Commonly known as: VITAMIN D3 Take 1,000 Units by mouth daily.   losartan-hydrochlorothiazide 50-12.5 MG tablet Commonly known as: HYZAAR Take 1 tablet by mouth daily.   methocarbamol 500 MG tablet Commonly known as: ROBAXIN Take 1 tablet (500 mg total) by mouth 4 (four) times daily.   multivitamin with minerals tablet Take 1 tablet by mouth at bedtime.   NON FORMULARY Pt uses a cpap nightly   oxyCODONE-acetaminophen 5-325 MG tablet Commonly known as: Percocet Take 1 tablet by mouth every 4 (four) hours as needed for severe pain.   simvastatin 40 MG tablet Commonly known as: ZOCOR Take 1 tablet (40 mg total) by mouth every evening.   temazepam 30 MG capsule Commonly known as: RESTORIL Take 30 mg by mouth at bedtime as needed for sleep.   terbinafine 250 MG tablet Commonly known as: LAMISIL Take 1 tablet (250 mg total) by mouth once a week to prevent onychomycosis  (nail fungus).   triamcinolone cream 0.1 % Commonly known as: KENALOG Apply 1 Application topically daily as needed         Follow-up Information     Kathalene Frames, MD. Schedule an appointment as soon as possible for a visit in 1 week(s).   Specialty: Internal Medicine Contact information: [address], [address] [place]  [place] [address] [phone]                 Major procedures and Radiology Reports - PLEASE review detailed and final reports thoroughly  -        VAS [place] LOWER EXTREMITY VENOUS (DVT)  Result Date: 02/04/2023  Lower Venous DVT Study Patient Name:  Ethel L Martinique  Date of Exam:   02/04/2023 Medical Rec #: [id number]       Accession #:    [id number] Date of Birth: [birth year]       Patient Gender: M Patient Age:   72 years Exam Location:  [facility] Procedure:      VAS [place] LOWER EXTREMITY VENOUS (DVT) Referring Phys: Karmen Bongo --------------------------------------------------------------------------------  Indications: Pulmonary embolism.  Limitations: Poor ultrasound/tissue interface. Comparison Study: No previous exams Performing Technologist: Jody Hill RVT, RDMS  Examination Guidelines: A complete evaluation includes B-mode imaging, spectral Doppler, color Doppler, and power Doppler as needed of all accessible portions of each vessel. Bilateral testing is considered an integral part of a complete examination. Limited examinations for reoccurring indications may be performed as noted. The reflux portion of the exam is performed with the patient in reverse Trendelenburg.  +---------+---------------+---------+-----------+----------+--------------+ RIGHT    CompressibilityPhasicitySpontaneityPropertiesThrombus Aging +---------+---------------+---------+-----------+----------+--------------+ CFV      Full           Yes      Yes                                 +---------+---------------+---------+-----------+----------+--------------+ SFJ      Full                                                        +---------+---------------+---------+-----------+----------+--------------+ FV Prox  Full            Yes      Yes                                 +---------+---------------+---------+-----------+----------+--------------+ FV Mid   Full           Yes      Yes                                 +---------+---------------+---------+-----------+----------+--------------+ FV DistalFull           Yes      Yes                                 +---------+---------------+---------+-----------+----------+--------------+ PFV      Full                                                        +---------+---------------+---------+-----------+----------+--------------+ POP      Full           Yes      Yes                                 +---------+---------------+---------+-----------+----------+--------------+  PTV      Full                                                        +---------+---------------+---------+-----------+----------+--------------+ PERO     Full                                                        +---------+---------------+---------+-----------+----------+--------------+   +---------+---------------+---------+-----------+----------+--------------+ LEFT     CompressibilityPhasicitySpontaneityPropertiesThrombus Aging +---------+---------------+---------+-----------+----------+--------------+ CFV      Full           Yes      Yes                                 +---------+---------------+---------+-----------+----------+--------------+ SFJ      Full                                                        +---------+---------------+---------+-----------+----------+--------------+ FV Prox  Full           Yes      Yes                                 +---------+---------------+---------+-----------+----------+--------------+ FV Mid   Full           Yes      Yes                                 +---------+---------------+---------+-----------+----------+--------------+ FV DistalFull           Yes      Yes                                  +---------+---------------+---------+-----------+----------+--------------+ PFV      Full                                                        +---------+---------------+---------+-----------+----------+--------------+ POP      Full           Yes      Yes                                 +---------+---------------+---------+-----------+----------+--------------+ PTV      Full                                                        +---------+---------------+---------+-----------+----------+--------------+  PERO     Full                                                        +---------+---------------+---------+-----------+----------+--------------+     Summary: BILATERAL: - No evidence of deep vein thrombosis seen in the lower extremities, bilaterally. -No evidence of popliteal cyst, bilaterally.   *See table(s) above for measurements and observations.    Preliminary    ECHOCARDIOGRAM COMPLETE  Result Date: 02/03/2023    ECHOCARDIOGRAM REPORT   Patient Name:   Jachin L Swaziland Date of Exam: 02/03/2023 Medical Rec #:  [id number]      Height:       70.0 in Accession #:    [id number]     Weight:       200.0 lb Date of Birth:  Sep 23, 1951      BSA:          2.087 m Patient Age:    72 years       BP:           129/60 mmHg Patient Gender: M              HR:           70 bpm. Exam Location:  Inpatient Procedure: 2D Echo, Cardiac Doppler and Color Doppler Indications:    I26.02 Pulmonary embolus  History:        Patient has no prior history of Echocardiogram examinations.                 Risk Factors:Hypertension and Dyslipidemia.  Sonographer:    Mike Gip Referring Phys: [id number] JENNIFER YATES IMPRESSIONS  1. Left ventricular ejection fraction, by estimation, is 60 to 65%. The left ventricle has normal function. The left ventricle has no regional wall motion abnormalities. There is mild concentric left ventricular hypertrophy. Left ventricular diastolic parameters were normal.  2.  Right ventricular systolic function is normal. The right ventricular size is normal. Tricuspid regurgitation signal is inadequate for assessing PA pressure.  3. The mitral valve is grossly normal. Trivial mitral valve regurgitation. No evidence of mitral stenosis.  4. The aortic valve is tricuspid. Aortic valve regurgitation is not visualized. No aortic stenosis is present. FINDINGS  Left Ventricle: Left ventricular ejection fraction, by estimation, is 60 to 65%. The left ventricle has normal function. The left ventricle has no regional wall motion abnormalities. The left ventricular internal cavity size was normal in size. There is  mild concentric left ventricular hypertrophy. Left ventricular diastolic parameters were normal. Right Ventricle: The right ventricular size is normal. No increase in right ventricular wall thickness. Right ventricular systolic function is normal. Tricuspid regurgitation signal is inadequate for assessing PA pressure. Left Atrium: Left atrial size was normal in size. Right Atrium: Right atrial size was normal in size. Pericardium: There is no evidence of pericardial effusion. Mitral Valve: The mitral valve is grossly normal. Trivial mitral valve regurgitation. No evidence of mitral valve stenosis. Tricuspid Valve: The tricuspid valve is grossly normal. Tricuspid valve regurgitation is trivial. No evidence of tricuspid stenosis. Aortic Valve: The aortic valve is tricuspid. Aortic valve regurgitation is not visualized. No aortic stenosis is present. Pulmonic Valve: The pulmonic valve was grossly normal. Pulmonic valve regurgitation is not visualized. No evidence of pulmonic stenosis. Aorta: The aortic root and ascending aorta are  structurally normal, with no evidence of dilitation. Venous: The inferior vena cava was not well visualized. IAS/Shunts: The atrial septum is grossly normal.  LEFT VENTRICLE PLAX 2D LVIDd:         4.10 cm      Diastology LVIDs:         2.40 cm      LV e' medial:     8.81 cm/s LV PW:         1.30 cm      LV E/e' medial:  9.5 LV IVS:        1.30 cm      LV e' lateral:   10.60 cm/s LVOT diam:     2.20 cm      LV E/e' lateral: 7.9 LV SV:         101 LV SV Index:   48 LVOT Area:     3.80 cm  LV Volumes (MOD) LV vol d, MOD A2C: 112.0 ml LV vol d, MOD A4C: 106.0 ml LV vol s, MOD A2C: 34.3 ml LV vol s, MOD A4C: 39.5 ml LV SV MOD A2C:     77.7 ml LV SV MOD A4C:     106.0 ml LV SV MOD BP:      73.9 ml RIGHT VENTRICLE RV Basal diam:  3.90 cm RV S prime:     15.20 cm/s TAPSE (M-mode): 2.3 cm LEFT ATRIUM           Index        RIGHT ATRIUM           Index LA diam:      3.30 cm 1.58 cm/m   RA Area:     18.90 cm LA Vol (A2C): 35.1 ml 16.82 ml/m  RA Volume:   52.80 ml  25.30 ml/m LA Vol (A4C): 45.4 ml 21.75 ml/m  AORTIC VALVE LVOT Vmax:   127.00 cm/s LVOT Vmean:  91.500 cm/s LVOT VTI:    0.266 m  AORTA Ao Root diam: 3.60 cm Ao Asc diam:  3.40 cm MITRAL VALVE MV Area (PHT): 4.15 cm    SHUNTS MV Decel Time: 183 msec    Systemic VTI:  0.27 m MV E velocity: 84.00 cm/s  Systemic Diam: 2.20 cm MV A velocity: 69.80 cm/s MV E/A ratio:  1.20 Eleonore Chiquito MD Electronically signed by Eleonore Chiquito MD Signature Date/Time: 02/03/2023/9:07:40 AM    Final    CT Angio Chest PE W and/or Wo Contrast  Result Date: 02/02/2023 CLINICAL DATA:  Pulmonary embolism (PE) suspected, high prob. Shortness of breath. EXAM: CT ANGIOGRAPHY CHEST WITH CONTRAST TECHNIQUE: Multidetector CT imaging of the chest was performed using the standard protocol during bolus administration of intravenous contrast. Multiplanar CT image reconstructions and MIPs were obtained to evaluate the vascular anatomy. RADIATION DOSE REDUCTION: This exam was performed according to the departmental dose-optimization program which includes automated exposure control, adjustment of the mA and/or kV according to patient size and/or use of iterative reconstruction technique. CONTRAST:  117mL OMNIPAQUE IOHEXOL 350 MG/ML SOLN COMPARISON:  None  Available. FINDINGS: Cardiovascular: Filling defects within anterior right upper lobe pulmonary arterial branches compatible with pulmonary emboli. Pulmonary embolus also noted in the right lower lobe posterior branch. No pulmonary emboli visualized on the left. Heart is normal size. Mild elevation of the RV/LV ratio measuring 1.05 suggesting right heart strain. Scattered coronary artery and aortic calcifications. No aneurysm. Mediastinum/Nodes: No mediastinal, hilar, or axillary adenopathy. Trachea and esophagus are unremarkable. Thyroid unremarkable. Lungs/Pleura: Small  right pleural effusion. Airspace disease in the right lower lobe may reflect pulmonary infarct. Linear areas of atelectasis or scarring in the left lung base. No left effusion. Upper Abdomen: No acute findings Musculoskeletal: Chest wall soft tissues are unremarkable. No acute bony abnormality. Review of the MIP images confirms the above findings. IMPRESSION: Pulmonary emboli in the right upper lobe and right lower lobe. CT evidence of right heart strain (RV/LV Ratio = 1.05) consistent with at least submassive (intermediate risk) PE. The presence of right heart strain has been associated with an increased risk of morbidity and mortality. Please refer to the "Code PE Focused" order set in EPIC. Airspace opacity in the right lower lobe may reflect pulmonary infarct. Small right pleural effusion. Coronary artery disease, aortic atherosclerosis. These results were called by telephone at the time of interpretation on 02/02/2023 at 9:10 pm to provider ADAM CURATOLO , who verbally acknowledged these results. Electronically Signed   By: Rolm Baptise M.D.   On: 02/02/2023 21:12   DG Chest Portable 1 View  Result Date: 02/02/2023 CLINICAL DATA:  Shortness of breath for 3 days. EXAM: PORTABLE CHEST 1 VIEW COMPARISON:  08/24/2020. FINDINGS: The heart size and mediastinal contours are within normal limits. There is atherosclerotic calcification of the aorta.  Lung volumes are low with atelectasis or infiltrate at the lung bases. No effusion or pneumothorax. Right shoulder arthroplasty changes are noted. IMPRESSION: Low lung volumes with atelectasis or infiltrate at the lung bases. Electronically Signed   By: Brett Fairy M.D.   On: 02/02/2023 20:03    Micro Results    Recent Results (from the past 240 hour(s))  Resp panel by RT-PCR (RSV, Flu A&B, Covid) Anterior Nasal Swab     Status: None   Collection Time: 02/02/23  7:47 PM   Specimen: Anterior Nasal Swab  Result Value Ref Range Status   SARS Coronavirus 2 by RT PCR NEGATIVE NEGATIVE Final    Comment: (NOTE) SARS-CoV-2 target nucleic acids are NOT DETECTED.  The SARS-CoV-2 RNA is generally detectable in upper respiratory specimens during the acute phase of infection. The lowest concentration of SARS-CoV-2 viral copies this assay can detect is 138 copies/mL. A negative result does not preclude SARS-Cov-2 infection and should not be used as the sole basis for treatment or other patient management decisions. A negative result may occur with  improper specimen collection/handling, submission of specimen other than nasopharyngeal swab, presence of viral mutation(s) within the areas targeted by this assay, and inadequate number of viral copies(<138 copies/mL). A negative result must be combined with clinical observations, patient history, and epidemiological information. The expected result is Negative.  Fact Sheet for Patients:  EntrepreneurPulse.com.au  Fact Sheet for Healthcare Providers:  IncredibleEmployment.be  This test is no t yet approved or cleared by the [place] FDA and  has been authorized for detection and/or diagnosis of SARS-CoV-2 by FDA under an Emergency Use Authorization (EUA). This EUA will remain  in effect (meaning this test can be used) for the duration of the COVID-19 declaration under Section 564(b)(1) of the Act,  21 U.S.C.section 360bbb-3(b)(1), unless the authorization is terminated  or revoked sooner.       Influenza A by PCR NEGATIVE NEGATIVE Final   Influenza B by PCR NEGATIVE NEGATIVE Final    Comment: (NOTE) The Xpert Xpress SARS-CoV-2/FLU/RSV plus assay is intended as an aid in the diagnosis of influenza from Nasopharyngeal swab specimens and should not be used as a sole basis for treatment. Nasal washings and  aspirates are unacceptable for Xpert Xpress SARS-CoV-2/FLU/RSV testing.  Fact Sheet for Patients: EntrepreneurPulse.com.au  Fact Sheet for Healthcare Providers: IncredibleEmployment.be  This test is not yet approved or cleared by the [place] FDA and has been authorized for detection and/or diagnosis of SARS-CoV-2 by FDA under an Emergency Use Authorization (EUA). This EUA will remain in effect (meaning this test can be used) for the duration of the COVID-19 declaration under Section 564(b)(1) of the Act, 21 U.S.C. section 360bbb-3(b)(1), unless the authorization is terminated or revoked.     Resp Syncytial Virus by PCR NEGATIVE NEGATIVE Final    Comment: (NOTE) Fact Sheet for Patients: EntrepreneurPulse.com.au  Fact Sheet for Healthcare Providers: IncredibleEmployment.be  This test is not yet approved or cleared by the [place] FDA and has been authorized for detection and/or diagnosis of SARS-CoV-2 by FDA under an Emergency Use Authorization (EUA). This EUA will remain in effect (meaning this test can be used) for the duration of the COVID-19 declaration under Section 564(b)(1) of the Act, 21 U.S.C. section 360bbb-3(b)(1), unless the authorization is terminated or revoked.  Performed at [facility], [address], [place], [place] [address]     Today   Subjective    Payton Martinique today has no headache,no chest abdominal pain,no new weakness tingling or  numbness, feels much better wants to go home today.    Objective   Blood pressure 119/64, pulse 75, temperature 98.2 F (36.8 C), temperature source Oral, resp. rate 20, height 5\' 10"  (1.778 m), weight 90.7 kg, SpO2 92 %.   Intake/Output Summary (Last 24 hours) at 02/04/2023 1148 Last data filed at 02/04/2023 0600 Gross per 24 hour  Intake 618.03 ml  Output 700 ml  Net -81.97 ml    Exam  Awake Alert, No new F.N deficits,    [place].AT,PERRAL Supple Neck,   Symmetrical Chest wall movement, Good air movement bilaterally, CTAB RRR,No Gallops,   +ve B.Sounds, Abd Soft, Non tender,  Mild right lower extremity swelling   Data Review   Recent Labs  Lab 02/02/23 1947 02/04/23 0257  WBC 10.3 8.5  HGB 14.8 13.5  HCT 42.3 37.8*  PLT 210 204  MCV 91.2 90.9  MCH 31.9 32.5  MCHC 35.0 35.7  RDW 13.0 12.7  LYMPHSABS 1.9  --   MONOABS 1.5*  --   EOSABS 0.0  --   BASOSABS 0.1  --     Recent Labs  Lab 02/02/23 1947 02/04/23 0257  NA 137 134*  K 3.6 2.9*  CL 100 99  CO2 26 23  ANIONGAP 11 12  GLUCOSE 105* 140*  BUN 24* 21  CREATININE 0.99 0.97  AST 36  --   ALT 36  --   ALKPHOS 77  --   BILITOT 1.9*  --   ALBUMIN 3.8  --   DDIMER 0.66*  --   BNP 38.6  --   CALCIUM 9.5 8.3*     Total Time in preparing paper work, data evaluation and todays exam - 35 minutes  Signature  -    Lala Lund M.D on 02/04/2023 at 11:48 AM   -  To page go to [email or web address]

## 2023-02-04 NOTE — Progress Notes (Addendum)
ANTICOAGULATION CONSULT NOTE - Initial Consult  Pharmacy Consult for Heparin Indication: pulmonary embolus  No Known Allergies  Patient Measurements: Height: 5\' 10"  (177.8 cm) Weight: 90.7 kg (200 lb) IBW/kg (Calculated) : 73 Heparin Dosing Weight: 90.7 kg  Vital Signs: Temp: 97.6 F (36.4 C) (03/19 0418) Temp Source: Oral (03/19 0418) BP: 142/63 (03/19 0418) Pulse Rate: 80 (03/19 0418)  Labs: Recent Labs    02/02/23 1947 02/02/23 2110 02/03/23 0449 02/03/23 1553 02/03/23 2307 02/04/23 0257  HGB 14.8  --   --   --   --  13.5  HCT 42.3  --   --   --   --  37.8*  PLT 210  --   --   --   --  204  HEPARINUNFRC  --   --    < > 0.23* 0.41 0.41  CREATININE 0.99  --   --   --   --  0.97  TROPONINIHS 5 5  --   --   --   --    < > = values in this interval not displayed.     Estimated Creatinine Clearance: 76.8 mL/min (by C-G formula based on SCr of 0.97 mg/dL).   Medical History: Past Medical History:  Diagnosis Date   Arthritis    History of kidney stones    Hyperlipidemia    Hypertension    Right inguinal hernia    Sleep apnea    uses CPAP nightly    Medications:  Medications Prior to Admission  Medication Sig Dispense Refill Last Dose   aspirin 81 MG EC tablet Take 81 mg by mouth at bedtime.    02/01/2023   cholecalciferol (VITAMIN D3) 25 MCG (1000 UNIT) tablet Take 1,000 Units by mouth daily.   Past Week   losartan-hydrochlorothiazide (HYZAAR) 50-12.5 MG tablet Take 1 tablet by mouth daily. 90 tablet 3 02/02/2023   methocarbamol (ROBAXIN) 500 MG tablet Take 1 tablet (500 mg total) by mouth 4 (four) times daily. 40 tablet 1 Past Month   Multiple Vitamins-Minerals (MULTIVITAMIN WITH MINERALS) tablet Take 1 tablet by mouth at bedtime.   Past Week   oxyCODONE-acetaminophen (PERCOCET) 5-325 MG tablet Take 1 tablet by mouth every 4 (four) hours as needed for severe pain. 20 tablet 0 > month   simvastatin (ZOCOR) 40 MG tablet Take 1 tablet (40 mg total) by mouth every  evening. 90 tablet 3 Past Week   temazepam (RESTORIL) 30 MG capsule Take 30 mg by mouth at bedtime as needed for sleep.   Past Month   terbinafine (LAMISIL) 250 MG tablet Take 1 tablet (250 mg total) by mouth once a week to prevent onychomycosis  (nail fungus). 12 tablet 2 01/26/2023   triamcinolone cream (KENALOG) 0.1 % Apply 1 Application topically daily as needed 15 g 3 Past Month   NON FORMULARY Pt uses a cpap nightly       Scheduled:  Infusions:  PRN:   Assessment: 17 yom with a history of HLD, sleep apnea. Patient is presenting with SOB since yesterday. Heparin per pharmacy consult placed for pulmonary embolus.  Patient is not on anticoagulation prior to arrival.  Hgb 13.5; plt 204  Heparin level came back therapeutic at 2300. Repeat HL with AM labs (only 4 hours after last HL) was the same.  Repeat HL at 0800 was 0.47  Apixaban and rivaroxaban copays are both $47 per month.    Goal of Therapy:  Heparin level 0.3-0.7 units/ml Monitor platelets by anticoagulation protocol:  Yes   Plan:   Continue heparin infusion at 1850 units/hr Check anti-Xa level daily Continue to monitor H&H and platelets  Marnita Poirier A. Levada Dy, PharmD, BCPS, FNKF Clinical Pharmacist [place] Please utilize Amion for appropriate phone number to reach the unit pharmacist ([place])  02/04/2023 7:42 AM  Addendum:  MD wishes to change patient to Apixaban for discharge. Will start Apixaban 10mg  PO BID x 7 days this AM and d/c heparin drip at the time of first dose. RN informed.   Toria Monte A. Levada Dy, PharmD, BCPS, FNKF Clinical Pharmacist [place] Please utilize Amion for appropriate phone number to reach the unit pharmacist ([place])

## 2023-02-04 NOTE — TOC Progression Note (Signed)
Transition of Care [facility]) - Progression Note    Patient Details  Name: Duane Greene MRN: [id number] Date of Birth: 30-May-1951  Transition of [facility]) CM/SW Contact  Levonne Lapping, RN Phone Number: 02/04/2023, 9:13 AM  Clinical Narrative:     Consult to Pharmacy for DOAC needs:   Eliquis co pay $47.00  Xarelto co pay $47.00    TOC will continue to follow patient for any additional discharge needs            Expected Discharge Plan and Services                                               Social Determinants of Health (SDOH) Interventions SDOH Screenings   Food Insecurity: No Food Insecurity (02/03/2023)  Housing: [place]  (02/03/2023)  Transportation Needs: No Transportation Needs (02/03/2023)  Utilities: Not At Risk (02/03/2023)  Tobacco Use: Low Risk  (02/03/2023)    Readmission Risk Interventions     No data to display

## 2023-02-04 NOTE — TOC Benefit Eligibility Note (Signed)
Patient Advocate Encounter  Insurance verification completed.    The patient is currently admitted and upon discharge could be taking Eliquis 5 mg.  The current 30 day co-pay is $47.00.   The patient is currently admitted and upon discharge could be taking Xarelto 20 mg.  The current 30 day co-pay is $47.00.   The patient is insured through [employer] Part D   Gunter Conde, CPHT Pharmacy Patient Advocate Specialist [facility] Patient Advocate Team Direct Number: [phone]  Fax: [phone]       

## 2023-02-06 DIAGNOSIS — R42 Dizziness and giddiness: Secondary | ICD-10-CM | POA: Diagnosis not present

## 2023-02-06 DIAGNOSIS — I959 Hypotension, unspecified: Secondary | ICD-10-CM | POA: Diagnosis not present

## 2023-02-17 ENCOUNTER — Ambulatory Visit
Admission: RE | Admit: 2023-02-17 | Discharge: 2023-02-17 | Disposition: A | Discharge status: Home / Self Care | Payer: PPO | Source: Ambulatory Visit | Attending: Internal Medicine | Admitting: Internal Medicine

## 2023-02-17 ENCOUNTER — Other Ambulatory Visit (HOSPITAL_BASED_OUTPATIENT_CLINIC_OR_DEPARTMENT_OTHER): Payer: Self-pay

## 2023-02-17 ENCOUNTER — Other Ambulatory Visit: Payer: Self-pay | Admitting: Internal Medicine

## 2023-02-17 DIAGNOSIS — G4733 Obstructive sleep apnea (adult) (pediatric): Secondary | ICD-10-CM | POA: Diagnosis not present

## 2023-02-17 DIAGNOSIS — I10 Essential (primary) hypertension: Secondary | ICD-10-CM | POA: Diagnosis not present

## 2023-02-17 DIAGNOSIS — R0602 Shortness of breath: Secondary | ICD-10-CM | POA: Diagnosis not present

## 2023-02-17 DIAGNOSIS — R972 Elevated prostate specific antigen [PSA]: Secondary | ICD-10-CM | POA: Diagnosis not present

## 2023-02-17 DIAGNOSIS — J90 Pleural effusion, not elsewhere classified: Secondary | ICD-10-CM

## 2023-02-17 MED ORDER — ELIQUIS 5 MG PO TABS
5.0000 mg | ORAL_TABLET | Freq: Two times a day (BID) | ORAL | 1 refills | Status: DC
  Filled 2023-02-17: qty 200, 100d supply, fill #0
  Filled 2023-05-28: qty 200, 100d supply, fill #1

## 2023-02-17 MED ORDER — ELIQUIS 5 MG PO TABS
5.0000 mg | ORAL_TABLET | Freq: Two times a day (BID) | ORAL | 3 refills | Status: DC
  Filled 2023-02-17 – 2023-02-18 (×2): qty 60, 30d supply, fill #0

## 2023-02-18 ENCOUNTER — Other Ambulatory Visit (HOSPITAL_BASED_OUTPATIENT_CLINIC_OR_DEPARTMENT_OTHER): Payer: Self-pay

## 2023-03-24 ENCOUNTER — Other Ambulatory Visit: Payer: Self-pay | Admitting: Internal Medicine

## 2023-03-24 ENCOUNTER — Ambulatory Visit
Admission: RE | Admit: 2023-03-24 | Discharge: 2023-03-24 | Disposition: A | Discharge status: Home / Self Care | Payer: PPO | Source: Ambulatory Visit | Attending: Internal Medicine | Admitting: Internal Medicine

## 2023-03-24 DIAGNOSIS — J90 Pleural effusion, not elsewhere classified: Secondary | ICD-10-CM

## 2023-04-02 DIAGNOSIS — M1712 Unilateral primary osteoarthritis, left knee: Secondary | ICD-10-CM | POA: Diagnosis not present

## 2023-04-21 ENCOUNTER — Other Ambulatory Visit (HOSPITAL_BASED_OUTPATIENT_CLINIC_OR_DEPARTMENT_OTHER): Payer: Self-pay

## 2023-04-22 ENCOUNTER — Other Ambulatory Visit (HOSPITAL_BASED_OUTPATIENT_CLINIC_OR_DEPARTMENT_OTHER): Payer: Self-pay

## 2023-05-14 ENCOUNTER — Other Ambulatory Visit (HOSPITAL_BASED_OUTPATIENT_CLINIC_OR_DEPARTMENT_OTHER): Payer: Self-pay

## 2023-05-15 ENCOUNTER — Other Ambulatory Visit (HOSPITAL_BASED_OUTPATIENT_CLINIC_OR_DEPARTMENT_OTHER): Payer: Self-pay

## 2023-05-15 MED ORDER — TERBINAFINE HCL 250 MG PO TABS
250.0000 mg | ORAL_TABLET | ORAL | 2 refills | Status: DC
  Filled 2023-05-15: qty 12, 84d supply, fill #0
  Filled 2023-07-28: qty 12, 84d supply, fill #1
  Filled 2023-10-30: qty 12, 84d supply, fill #2

## 2023-05-28 ENCOUNTER — Other Ambulatory Visit (HOSPITAL_BASED_OUTPATIENT_CLINIC_OR_DEPARTMENT_OTHER): Payer: Self-pay

## 2023-05-29 ENCOUNTER — Other Ambulatory Visit (HOSPITAL_BASED_OUTPATIENT_CLINIC_OR_DEPARTMENT_OTHER): Payer: Self-pay

## 2023-05-31 ENCOUNTER — Other Ambulatory Visit (HOSPITAL_BASED_OUTPATIENT_CLINIC_OR_DEPARTMENT_OTHER): Payer: Self-pay

## 2023-06-04 DIAGNOSIS — H5203 Hypermetropia, bilateral: Secondary | ICD-10-CM | POA: Diagnosis not present

## 2023-06-04 DIAGNOSIS — H2513 Age-related nuclear cataract, bilateral: Secondary | ICD-10-CM | POA: Diagnosis not present

## 2023-06-04 DIAGNOSIS — M1712 Unilateral primary osteoarthritis, left knee: Secondary | ICD-10-CM | POA: Diagnosis not present

## 2023-06-04 DIAGNOSIS — H5340 Unspecified visual field defects: Secondary | ICD-10-CM | POA: Diagnosis not present

## 2023-06-04 DIAGNOSIS — H4720 Unspecified optic atrophy: Secondary | ICD-10-CM | POA: Diagnosis not present

## 2023-07-28 ENCOUNTER — Other Ambulatory Visit (HOSPITAL_BASED_OUTPATIENT_CLINIC_OR_DEPARTMENT_OTHER): Payer: Self-pay

## 2023-08-12 DIAGNOSIS — Z01818 Encounter for other preprocedural examination: Secondary | ICD-10-CM | POA: Diagnosis not present

## 2023-08-12 DIAGNOSIS — Z0189 Encounter for other specified special examinations: Secondary | ICD-10-CM | POA: Diagnosis not present

## 2023-09-08 ENCOUNTER — Other Ambulatory Visit (HOSPITAL_BASED_OUTPATIENT_CLINIC_OR_DEPARTMENT_OTHER): Payer: Self-pay

## 2023-09-08 DIAGNOSIS — G8918 Other acute postprocedural pain: Secondary | ICD-10-CM | POA: Diagnosis not present

## 2023-09-08 DIAGNOSIS — M1712 Unilateral primary osteoarthritis, left knee: Secondary | ICD-10-CM | POA: Diagnosis not present

## 2023-09-08 DIAGNOSIS — M25762 Osteophyte, left knee: Secondary | ICD-10-CM | POA: Diagnosis not present

## 2023-09-08 DIAGNOSIS — M24562 Contracture, left knee: Secondary | ICD-10-CM | POA: Diagnosis not present

## 2023-09-08 MED ORDER — OXYCODONE-ACETAMINOPHEN 5-325 MG PO TABS
1.0000 | ORAL_TABLET | ORAL | 0 refills | Status: DC
  Filled 2023-09-08: qty 40, 4d supply, fill #0

## 2023-09-08 MED ORDER — ZOLPIDEM TARTRATE 5 MG PO TABS
5.0000 mg | ORAL_TABLET | Freq: Every day | ORAL | 0 refills | Status: DC | PRN
  Filled 2023-09-08: qty 30, 30d supply, fill #0

## 2023-09-08 MED ORDER — METHOCARBAMOL 500 MG PO TABS
500.0000 mg | ORAL_TABLET | Freq: Four times a day (QID) | ORAL | 0 refills | Status: AC
  Filled 2023-09-08: qty 40, 10d supply, fill #0

## 2023-09-08 MED ORDER — ONDANSETRON HCL 8 MG PO TABS
8.0000 mg | ORAL_TABLET | Freq: Two times a day (BID) | ORAL | 0 refills | Status: DC | PRN
  Filled 2023-09-08: qty 30, 15d supply, fill #0

## 2023-09-11 DIAGNOSIS — M25562 Pain in left knee: Secondary | ICD-10-CM | POA: Diagnosis not present

## 2023-09-15 DIAGNOSIS — M25562 Pain in left knee: Secondary | ICD-10-CM | POA: Diagnosis not present

## 2023-09-17 DIAGNOSIS — M25562 Pain in left knee: Secondary | ICD-10-CM | POA: Diagnosis not present

## 2023-09-19 DIAGNOSIS — M25562 Pain in left knee: Secondary | ICD-10-CM | POA: Diagnosis not present

## 2023-09-22 DIAGNOSIS — M25562 Pain in left knee: Secondary | ICD-10-CM | POA: Diagnosis not present

## 2023-09-23 ENCOUNTER — Other Ambulatory Visit (HOSPITAL_BASED_OUTPATIENT_CLINIC_OR_DEPARTMENT_OTHER): Payer: Self-pay

## 2023-09-23 DIAGNOSIS — Z4789 Encounter for other orthopedic aftercare: Secondary | ICD-10-CM | POA: Diagnosis not present

## 2023-09-23 MED ORDER — ELIQUIS 5 MG PO TABS
5.0000 mg | ORAL_TABLET | Freq: Two times a day (BID) | ORAL | 0 refills | Status: DC
  Filled 2023-09-23: qty 20, 10d supply, fill #0

## 2023-09-24 DIAGNOSIS — M25562 Pain in left knee: Secondary | ICD-10-CM | POA: Diagnosis not present

## 2023-09-26 DIAGNOSIS — M25562 Pain in left knee: Secondary | ICD-10-CM | POA: Diagnosis not present

## 2023-09-29 ENCOUNTER — Other Ambulatory Visit (HOSPITAL_BASED_OUTPATIENT_CLINIC_OR_DEPARTMENT_OTHER): Payer: Self-pay

## 2023-09-29 DIAGNOSIS — M25562 Pain in left knee: Secondary | ICD-10-CM | POA: Diagnosis not present

## 2023-10-02 DIAGNOSIS — M25562 Pain in left knee: Secondary | ICD-10-CM | POA: Diagnosis not present

## 2023-10-06 DIAGNOSIS — M25562 Pain in left knee: Secondary | ICD-10-CM | POA: Diagnosis not present

## 2023-10-09 DIAGNOSIS — M25562 Pain in left knee: Secondary | ICD-10-CM | POA: Diagnosis not present

## 2023-10-13 DIAGNOSIS — E782 Mixed hyperlipidemia: Secondary | ICD-10-CM | POA: Diagnosis not present

## 2023-10-13 DIAGNOSIS — I10 Essential (primary) hypertension: Secondary | ICD-10-CM | POA: Diagnosis not present

## 2023-10-13 DIAGNOSIS — M5416 Radiculopathy, lumbar region: Secondary | ICD-10-CM | POA: Diagnosis not present

## 2023-10-13 DIAGNOSIS — M25562 Pain in left knee: Secondary | ICD-10-CM | POA: Diagnosis not present

## 2023-10-13 DIAGNOSIS — Z1331 Encounter for screening for depression: Secondary | ICD-10-CM | POA: Diagnosis not present

## 2023-10-13 DIAGNOSIS — Z125 Encounter for screening for malignant neoplasm of prostate: Secondary | ICD-10-CM | POA: Diagnosis not present

## 2023-10-13 DIAGNOSIS — R7303 Prediabetes: Secondary | ICD-10-CM | POA: Diagnosis not present

## 2023-10-13 DIAGNOSIS — E669 Obesity, unspecified: Secondary | ICD-10-CM | POA: Diagnosis not present

## 2023-10-13 DIAGNOSIS — H4720 Unspecified optic atrophy: Secondary | ICD-10-CM | POA: Diagnosis not present

## 2023-10-13 DIAGNOSIS — E559 Vitamin D deficiency, unspecified: Secondary | ICD-10-CM | POA: Diagnosis not present

## 2023-10-13 DIAGNOSIS — Z0000 Encounter for general adult medical examination without abnormal findings: Secondary | ICD-10-CM | POA: Diagnosis not present

## 2023-10-13 DIAGNOSIS — Z9989 Dependence on other enabling machines and devices: Secondary | ICD-10-CM | POA: Diagnosis not present

## 2023-10-13 DIAGNOSIS — G4733 Obstructive sleep apnea (adult) (pediatric): Secondary | ICD-10-CM | POA: Diagnosis not present

## 2023-10-13 DIAGNOSIS — Z79899 Other long term (current) drug therapy: Secondary | ICD-10-CM | POA: Diagnosis not present

## 2023-10-13 DIAGNOSIS — G629 Polyneuropathy, unspecified: Secondary | ICD-10-CM | POA: Diagnosis not present

## 2023-10-13 DIAGNOSIS — Z23 Encounter for immunization: Secondary | ICD-10-CM | POA: Diagnosis not present

## 2023-10-15 DIAGNOSIS — M25562 Pain in left knee: Secondary | ICD-10-CM | POA: Diagnosis not present

## 2023-10-20 DIAGNOSIS — M25562 Pain in left knee: Secondary | ICD-10-CM | POA: Diagnosis not present

## 2023-10-23 DIAGNOSIS — M25562 Pain in left knee: Secondary | ICD-10-CM | POA: Diagnosis not present

## 2023-10-30 ENCOUNTER — Other Ambulatory Visit (HOSPITAL_BASED_OUTPATIENT_CLINIC_OR_DEPARTMENT_OTHER): Payer: Self-pay

## 2023-10-30 ENCOUNTER — Encounter (HOSPITAL_BASED_OUTPATIENT_CLINIC_OR_DEPARTMENT_OTHER): Payer: Self-pay

## 2023-10-30 MED ORDER — GABAPENTIN 300 MG PO CAPS
300.0000 mg | ORAL_CAPSULE | Freq: Every evening | ORAL | 0 refills | Status: DC | PRN
  Filled 2023-10-30: qty 60, 20d supply, fill #0

## 2023-10-31 ENCOUNTER — Other Ambulatory Visit (HOSPITAL_BASED_OUTPATIENT_CLINIC_OR_DEPARTMENT_OTHER): Payer: Self-pay

## 2023-10-31 MED ORDER — LOSARTAN POTASSIUM-HCTZ 50-12.5 MG PO TABS
1.0000 | ORAL_TABLET | Freq: Every day | ORAL | 3 refills | Status: DC
  Filled 2023-10-31: qty 100, 100d supply, fill #0
  Filled 2024-01-27: qty 100, 100d supply, fill #1

## 2023-11-03 ENCOUNTER — Other Ambulatory Visit (HOSPITAL_BASED_OUTPATIENT_CLINIC_OR_DEPARTMENT_OTHER): Payer: Self-pay

## 2023-11-03 MED ORDER — GABAPENTIN 100 MG PO CAPS
100.0000 mg | ORAL_CAPSULE | Freq: Every day | ORAL | 0 refills | Status: DC
  Filled 2023-11-03: qty 30, 30d supply, fill #0

## 2023-11-04 DIAGNOSIS — M13861 Other specified arthritis, right knee: Secondary | ICD-10-CM | POA: Diagnosis not present

## 2023-11-04 DIAGNOSIS — M25561 Pain in right knee: Secondary | ICD-10-CM | POA: Diagnosis not present

## 2023-11-06 DIAGNOSIS — L570 Actinic keratosis: Secondary | ICD-10-CM | POA: Diagnosis not present

## 2023-11-06 DIAGNOSIS — D692 Other nonthrombocytopenic purpura: Secondary | ICD-10-CM | POA: Diagnosis not present

## 2023-11-06 DIAGNOSIS — D485 Neoplasm of uncertain behavior of skin: Secondary | ICD-10-CM | POA: Diagnosis not present

## 2023-11-06 DIAGNOSIS — L821 Other seborrheic keratosis: Secondary | ICD-10-CM | POA: Diagnosis not present

## 2023-11-06 DIAGNOSIS — D225 Melanocytic nevi of trunk: Secondary | ICD-10-CM | POA: Diagnosis not present

## 2023-11-06 DIAGNOSIS — D1801 Hemangioma of skin and subcutaneous tissue: Secondary | ICD-10-CM | POA: Diagnosis not present

## 2023-11-06 DIAGNOSIS — L814 Other melanin hyperpigmentation: Secondary | ICD-10-CM | POA: Diagnosis not present

## 2023-11-13 ENCOUNTER — Other Ambulatory Visit (HOSPITAL_BASED_OUTPATIENT_CLINIC_OR_DEPARTMENT_OTHER): Payer: Self-pay

## 2023-11-21 ENCOUNTER — Other Ambulatory Visit (HOSPITAL_BASED_OUTPATIENT_CLINIC_OR_DEPARTMENT_OTHER): Payer: Self-pay

## 2023-11-25 ENCOUNTER — Other Ambulatory Visit (HOSPITAL_BASED_OUTPATIENT_CLINIC_OR_DEPARTMENT_OTHER): Payer: Self-pay

## 2023-11-25 MED ORDER — MUPIROCIN 2 % EX OINT
1.0000 | TOPICAL_OINTMENT | Freq: Two times a day (BID) | CUTANEOUS | 0 refills | Status: DC
  Filled 2023-11-25: qty 22, 14d supply, fill #0

## 2023-12-09 ENCOUNTER — Other Ambulatory Visit (HOSPITAL_BASED_OUTPATIENT_CLINIC_OR_DEPARTMENT_OTHER): Payer: Self-pay

## 2023-12-09 MED ORDER — MECLIZINE HCL 12.5 MG PO TABS
12.5000 mg | ORAL_TABLET | Freq: Two times a day (BID) | ORAL | 0 refills | Status: DC | PRN
  Filled 2023-12-09: qty 20, 5d supply, fill #0

## 2023-12-11 ENCOUNTER — Other Ambulatory Visit (HOSPITAL_BASED_OUTPATIENT_CLINIC_OR_DEPARTMENT_OTHER): Payer: Self-pay

## 2023-12-11 MED ORDER — AMOXICILLIN 500 MG PO TABS
ORAL_TABLET | ORAL | 4 refills | Status: DC
  Filled 2023-12-11: qty 4, 1d supply, fill #0

## 2023-12-18 NOTE — H&P (Signed)
Patient's anticipated LOS is less than 2 midnights, meeting these requirements: - Younger than 24 - Lives within 1 hour of care - Has a competent adult at home to recover with post-op recover - NO history of  - Chronic pain requiring opiods  - Diabetes  - Coronary Artery Disease  - Heart failure  - Heart attack  - Stroke  - DVT/VTE  - Cardiac arrhythmia  - Respiratory Failure/COPD  - Renal failure  - Anemia  - Advanced Liver disease     Duane Greene is an 73 y.o. male.    Chief Complaint: right knee pain  HPI: Pt is a 73 y.o. male complaining of right knee pain for multiple years. Pain had continually increased since the beginning. X-rays in the clinic show end-stage arthritic changes of the right knee. Pt has tried various conservative treatments which have failed to alleviate their symptoms, including injections and therapy. Various options are discussed with the patient. Risks, benefits and expectations were discussed with the patient. Patient understand the risks, benefits and expectations and wishes to proceed with surgery.   PCP:  Emilio Aspen, MD  D/C Plans: Home  PMH: Past Medical History:  Diagnosis Date   Arthritis    History of kidney stones    Hyperlipidemia    Hypertension    Right inguinal hernia    Sleep apnea    uses CPAP nightly    PSH: Past Surgical History:  Procedure Laterality Date   CHOLECYSTECTOMY N/A 06/13/2020   Procedure: LAPAROSCOPIC CHOLECYSTECTOMY;  Surgeon: Manus Rudd, MD;  Location: [facility] OR;  Service: General;  Laterality: N/A;   COLONOSCOPY     EXTRACORPOREAL SHOCK WAVE LITHOTRIPSY Left 10/18/2019   Procedure: EXTRACORPOREAL SHOCK WAVE LITHOTRIPSY (ESWL);  Surgeon: Heloise Purpura, MD;  Location: [facility];  Service: Urology;  Laterality: Left;   INGUINAL HERNIA REPAIR Right 06/30/2019   Procedure: RIGHT INGUINAL HERNIA REPAIR WITH MESH;  Surgeon: Manus Rudd, MD;  Location: [facility];  Service: General;   Laterality: Right;   INTRAOPERATIVE CHOLANGIOGRAM N/A 06/13/2020   Procedure: INTRAOPERATIVE CHOLANGIOGRAM;  Surgeon: Manus Rudd, MD;  Location: [facility] OR;  Service: General;  Laterality: N/A;   KNEE ARTHROSCOPY Left    LUMBAR LAMINECTOMY/DECOMPRESSION MICRODISCECTOMY N/A 08/28/2020   Procedure: Laminectomy - Lumbar One-Two, Lumbar Two-Three, Lumbar Three-Four, Lumbar Four-Five, Lumbar Five-Sacral One;  Surgeon: Tia Alert, MD;  Location: [facility] OR;  Service: Neurosurgery;  Laterality: N/A;  posterior   REVERSE SHOULDER ARTHROPLASTY Right 09/06/2022   Procedure: REVERSE Right SHOULDER ARTHROPLASTY;  Surgeon: Beverely Low, MD;  Location: [facility];  Service: Orthopedics;  Laterality: Right;  120 hours choice and interscalene block   SHOULDER ARTHROSCOPY Bilateral    TONSILLECTOMY      Social History:  reports that he has never smoked. He has never used smokeless tobacco. He reports that he does not drink alcohol and does not use drugs. BMI: Estimated body mass index is 28.7 kg/m as calculated from the following:   Height as of 02/02/23: 5\' 10"  (1.778 m).   Weight as of 02/02/23: 90.7 kg.  Lab Results  Component Value Date   ALBUMIN 3.8 02/02/2023   Diabetes: Patient does not have a diagnosis of diabetes.     Smoking Status:   reports that he has never smoked. He has never used smokeless tobacco.    Allergies:  No Known Allergies  Medications: No current facility-administered medications for this encounter.   Current Outpatient Medications  Medication Sig Dispense Refill  amoxicillin (AMOXIL) 500 MG tablet Take 2 tablets by oral route 1 hour before dentist or other procedures and 2 tablets after visit. 4 tablet 4   APIXABAN (ELIQUIS) VTE STARTER PACK (10MG  AND 5MG ) *Start on Day 2 of Starter Pack* Take 2 tablets (10 mg total) by mouth 2 (two) times daily for 6 days. Then take 1 tablet (5 mg total) by mouth 2 (two) times daily. 74 each 0   apixaban (ELIQUIS) 5 MG TABS tablet Take 1  tablet (5 mg total) by mouth 2 (two) times daily. 60 tablet 0   apixaban (ELIQUIS) 5 MG TABS tablet Take 1 tablet (5 mg total) by mouth 2 (two) times daily. 200 tablet 1   apixaban (ELIQUIS) 5 MG TABS tablet Take 1 tablet (5 mg total) by mouth 2 (two) times daily. 60 tablet 3   apixaban (ELIQUIS) 5 MG TABS tablet Take 1 tablet (5 mg total) by mouth 2 (two) times daily. 20 tablet 0   cholecalciferol (VITAMIN D3) 25 MCG (1000 UNIT) tablet Take 1,000 Units by mouth daily.     gabapentin (NEURONTIN) 100 MG capsule Take 1 capsule (100 mg total) by mouth at bedtime. 30 capsule 0   gabapentin (NEURONTIN) 300 MG capsule Take 1-3 capsules (300-900 mg total) by mouth at bedtime as needed. 60 capsule 0   losartan-hydrochlorothiazide (HYZAAR) 50-12.5 MG tablet Take 1 tablet by mouth daily. 90 tablet 3   losartan-hydrochlorothiazide (HYZAAR) 50-12.5 MG tablet Take 1 tablet by mouth daily. 100 tablet 3   meclizine (ANTIVERT) 12.5 MG tablet Take 1-2 tablets (12.5-25 mg total) by mouth every 12 (twelve) hours as needed for vertigo 20 tablet 0   methocarbamol (ROBAXIN) 500 MG tablet Take 1 tablet (500 mg total) by mouth 4 (four) times daily. 40 tablet 1   Multiple Vitamins-Minerals (MULTIVITAMIN WITH MINERALS) tablet Take 1 tablet by mouth at bedtime.     mupirocin ointment (BACTROBAN) 2 % Apply 1 Application topically 2 (two) times daily to affected areas 22 g 0   NON FORMULARY Pt uses a cpap nightly     ondansetron (ZOFRAN) 8 MG tablet Take 1 tablet (8 mg total) by mouth 2 (two) times daily as needed for nausea or vomiting 30 tablet 0   oxyCODONE-acetaminophen (PERCOCET/ROXICET) 5-325 MG tablet Take 1-2 tablets by mouth every 4-6 hours as needed for pain. NOT TO EXCEED 6 tablets a day. 40 tablet 0   simvastatin (ZOCOR) 40 MG tablet Take 1 tablet (40 mg total) by mouth every evening. 90 tablet 3   temazepam (RESTORIL) 30 MG capsule Take 30 mg by mouth at bedtime as needed for sleep.     terbinafine (LAMISIL) 250  MG tablet Take 1 tablet (250 mg total) by mouth once a week to prevent onychomycosis  (nail fungus). 12 tablet 2   terbinafine (LAMISIL) 250 MG tablet Take 1 tablet (250 mg total) by mouth once a week to prevent onychomycosis. 12 tablet 2   triamcinolone cream (KENALOG) 0.1 % Apply 1 Application topically daily as needed 15 g 3   zolpidem (AMBIEN) 5 MG tablet Take 1 tablet (5 mg total) by mouth daily as needed for insomnia 30 tablet 0    No results found for this or any previous visit (from the past 48 hours). No results found.  ROS: Pain with rom of the right lower extremity  Physical Exam: Alert and oriented 73 y.o. male in no acute distress Cranial nerves 2-12 intact Cervical spine: full rom with no tenderness, nv  intact distally Chest: active breath sounds bilaterally, no wheeze rhonchi or rales Heart: regular rate and rhythm, no murmur Abd: non tender non distended with active bowel sounds Hip is stable with rom  Right knee painful rom with crepitus Antalgic gait Nv intact distally  Assessment/Plan Assessment: right knee end stage osteoarthritis  Plan:  Patient will undergo a right total knee by Dr. Ranell Patrick at [facility] Risks benefits and expectations were discussed with the patient. Patient understand risks, benefits and expectations and wishes to proceed. Preoperative templating of the joint replacement has been completed, documented, and submitted to the Operating Room personnel in order to optimize intra-operative equipment management.   Alphonsa Overall PA-C, [facility] is now [employer] [address], [address], [place], [place] [address] Phone: [phone] [email or web address] Facebook  [employer]

## 2023-12-19 ENCOUNTER — Encounter: Payer: Self-pay | Admitting: Neurology

## 2023-12-29 ENCOUNTER — Other Ambulatory Visit: Payer: Self-pay

## 2023-12-29 ENCOUNTER — Other Ambulatory Visit (HOSPITAL_BASED_OUTPATIENT_CLINIC_OR_DEPARTMENT_OTHER): Payer: Self-pay

## 2023-12-29 MED ORDER — TERBINAFINE HCL 250 MG PO TABS
250.0000 mg | ORAL_TABLET | ORAL | 2 refills | Status: DC
  Filled 2023-12-29 – 2024-01-01 (×3): qty 12, 84d supply, fill #0
  Filled 2024-04-06: qty 12, 84d supply, fill #1
  Filled 2024-07-04: qty 12, 84d supply, fill #2

## 2023-12-29 MED ORDER — SIMVASTATIN 40 MG PO TABS
40.0000 mg | ORAL_TABLET | Freq: Every evening | ORAL | 3 refills | Status: DC
  Filled 2023-12-29: qty 100, 100d supply, fill #0
  Filled 2024-03-23: qty 100, 100d supply, fill #1
  Filled 2024-07-06: qty 100, 100d supply, fill #2
  Filled 2024-10-15: qty 100, 100d supply, fill #3

## 2023-12-31 ENCOUNTER — Other Ambulatory Visit (HOSPITAL_BASED_OUTPATIENT_CLINIC_OR_DEPARTMENT_OTHER): Payer: Self-pay

## 2024-01-01 ENCOUNTER — Other Ambulatory Visit (HOSPITAL_BASED_OUTPATIENT_CLINIC_OR_DEPARTMENT_OTHER): Payer: Self-pay

## 2024-01-01 MED ORDER — GABAPENTIN 300 MG PO CAPS
300.0000 mg | ORAL_CAPSULE | Freq: Every day | ORAL | 3 refills | Status: DC
  Filled 2024-01-01: qty 90, 90d supply, fill #0
  Filled 2024-03-23: qty 90, 90d supply, fill #1
  Filled 2024-07-04: qty 90, 90d supply, fill #2
  Filled 2024-09-14: qty 90, 90d supply, fill #3

## 2024-01-02 ENCOUNTER — Encounter (HOSPITAL_COMMUNITY): Payer: PPO

## 2024-01-09 ENCOUNTER — Ambulatory Visit (HOSPITAL_COMMUNITY): Admit: 2024-01-09 | Payer: PPO | Admitting: Orthopedic Surgery

## 2024-01-09 SURGERY — ARTHROPLASTY, KNEE, TOTAL
Anesthesia: Spinal | Site: Knee | Laterality: Right

## 2024-01-27 ENCOUNTER — Other Ambulatory Visit (HOSPITAL_BASED_OUTPATIENT_CLINIC_OR_DEPARTMENT_OTHER): Payer: Self-pay

## 2024-01-27 DIAGNOSIS — M25551 Pain in right hip: Secondary | ICD-10-CM | POA: Diagnosis not present

## 2024-01-27 DIAGNOSIS — M1611 Unilateral primary osteoarthritis, right hip: Secondary | ICD-10-CM | POA: Diagnosis not present

## 2024-01-27 MED ORDER — CELECOXIB 100 MG PO CAPS
100.0000 mg | ORAL_CAPSULE | Freq: Two times a day (BID) | ORAL | 0 refills | Status: DC
  Filled 2024-01-27: qty 60, 30d supply, fill #0

## 2024-01-29 NOTE — Progress Notes (Signed)
 Initial neurology clinic note  Reason for Evaluation: Consultation requested by Emilio Aspen, * for an opinion regarding burning in feet. My final recommendations will be communicated back to the requesting physician by way of shared medical record or letter to requesting physician via [place] mail.  HPI: This is Mr. Duane Greene, a 73 y.o. right-handed male with a medical history of DM, HTN, HLD, OSA on CPAP, vit D deficiency, lumbar radiculopathy s/p L1-S1 decompression and laminectomies, psoriasis, OA, optic nerve atrophy, PE (02/2023) who presents to neurology clinic with the chief complaint of burning in feet. The patient is alone today.  Patient's symptoms started about 2 years with feeling like there was a "cushion under his feet." Then after his left knee surgery in 08/2023 patient would wake up with feet tingling and burning. He was told by PCP this was likely the start of neuropathy. He was started on gabapentin 300 mg at bedtime and things were much improved. He does not have many symptoms during the day. Symptoms are worse at night. He feels like his symptoms are well controlled right now. EMG in 11/2023 (external) was interpreted as peripheral neuropathy.  He denies significant back. Of note, he does have a history of lumbar radiculopathy and has chronic numbness in the left leg. He has only occasional imbalance when walking. He denies any falls.  The patient does not report symptoms referable to autonomic dysfunction including impaired sweating, heat or cold intolerance, excessive mucosal dryness, gastroparetic early satiety, postprandial abdominal bloating, constipation, bowel or bladder dyscontrol, or syncope/presyncope/orthostatic intolerance.  He does not report any constitutional symptoms like fever, night sweats, anorexia or unintentional weight loss.  EtOH use: none  Restrictive diet? No Family history of neuropathy/myopathy/neurologic disease? Brother had diabetic  neuropathy  Patient takes vit D and alpha lipoic acid 600 mg.  Of note, patient has optic nerve atrophy of unclear cause and follows with ophtho.   MEDICATIONS:  Outpatient Encounter Medications as of 02/06/2024  Medication Sig   celecoxib (CELEBREX) 100 MG capsule Take 1 capsule (100 mg total) by mouth 2 (two) times daily.   cholecalciferol (VITAMIN D3) 25 MCG (1000 UNIT) tablet Take 1,000 Units by mouth daily.   gabapentin (NEURONTIN) 300 MG capsule Take 1 capsule (300 mg total) by mouth at bedtime.   losartan-hydrochlorothiazide (HYZAAR) 50-12.5 MG tablet Take 1 tablet by mouth daily.   Multiple Vitamins-Minerals (MULTIVITAMIN WITH MINERALS) tablet Take 1 tablet by mouth at bedtime.   mupirocin ointment (BACTROBAN) 2 % Apply 1 Application topically 2 (two) times daily to affected areas   NON FORMULARY Pt uses a cpap nightly   simvastatin (ZOCOR) 40 MG tablet Take 1 tablet (40 mg total) by mouth every evening.   terbinafine (LAMISIL) 250 MG tablet Take 1 tablet (250 mg total) by mouth once a week to prevent onychomycosis.   triamcinolone cream (KENALOG) 0.1 % Apply 1 Application topically daily as needed   [DISCONTINUED] gabapentin (NEURONTIN) 300 MG capsule Take 1-3 capsules (300-900 mg total) by mouth at bedtime as needed.   meclizine (ANTIVERT) 12.5 MG tablet Take 1-2 tablets (12.5-25 mg total) by mouth every 12 (twelve) hours as needed for vertigo (Patient not taking: Reported on 02/06/2024)   methocarbamol (ROBAXIN) 500 MG tablet Take 1 tablet (500 mg total) by mouth 4 (four) times daily. (Patient not taking: Reported on 02/06/2024)   ondansetron (ZOFRAN) 8 MG tablet Take 1 tablet (8 mg total) by mouth 2 (two) times daily as needed for nausea or  vomiting (Patient not taking: Reported on 02/06/2024)   temazepam (RESTORIL) 30 MG capsule Take 30 mg by mouth at bedtime as needed for sleep. (Patient not taking: Reported on 02/06/2024)   terbinafine (LAMISIL) 250 MG tablet Take 1 tablet (250 mg  total) by mouth once a week to prevent onychomycosis  (nail fungus). (Patient not taking: Reported on 02/06/2024)   zolpidem (AMBIEN) 5 MG tablet Take 1 tablet (5 mg total) by mouth daily as needed for insomnia (Patient not taking: Reported on 02/06/2024)   [DISCONTINUED] amoxicillin (AMOXIL) 500 MG tablet Take 2 tablets by oral route 1 hour before dentist or other procedures and 2 tablets after visit.   [DISCONTINUED] apixaban (ELIQUIS) 5 MG TABS tablet Take 1 tablet (5 mg total) by mouth 2 (two) times daily. (Patient not taking: Reported on 02/06/2024)   [DISCONTINUED] apixaban (ELIQUIS) 5 MG TABS tablet Take 1 tablet (5 mg total) by mouth 2 (two) times daily. (Patient not taking: Reported on 02/06/2024)   [DISCONTINUED] apixaban (ELIQUIS) 5 MG TABS tablet Take 1 tablet (5 mg total) by mouth 2 (two) times daily. (Patient not taking: Reported on 02/06/2024)   [DISCONTINUED] apixaban (ELIQUIS) 5 MG TABS tablet Take 1 tablet (5 mg total) by mouth 2 (two) times daily.   [DISCONTINUED] APIXABAN (ELIQUIS) VTE STARTER PACK (10MG  AND 5MG ) *Start on Day 2 of Starter Pack* Take 2 tablets (10 mg total) by mouth 2 (two) times daily for 6 days. Then take 1 tablet (5 mg total) by mouth 2 (two) times daily.   [DISCONTINUED] gabapentin (NEURONTIN) 100 MG capsule Take 1 capsule (100 mg total) by mouth at bedtime.   [DISCONTINUED] losartan-hydrochlorothiazide (HYZAAR) 50-12.5 MG tablet Take 1 tablet by mouth daily. (Patient not taking: Reported on 02/06/2024)   [DISCONTINUED] oxyCODONE-acetaminophen (PERCOCET/ROXICET) 5-325 MG tablet Take 1-2 tablets by mouth every 4-6 hours as needed for pain. NOT TO EXCEED 6 tablets a day. (Patient not taking: Reported on 02/06/2024)   No facility-administered encounter medications on file as of 02/06/2024.    PAST MEDICAL HISTORY: Past Medical History:  Diagnosis Date   Arthritis    History of kidney stones    Hyperlipidemia    Hypertension    Right inguinal hernia    Sleep apnea     uses CPAP nightly    PAST SURGICAL HISTORY: Past Surgical History:  Procedure Laterality Date   CHOLECYSTECTOMY N/A 06/13/2020   Procedure: LAPAROSCOPIC CHOLECYSTECTOMY;  Surgeon: Manus Rudd, MD;  Location: [facility] OR;  Service: General;  Laterality: N/A;   COLONOSCOPY     EXTRACORPOREAL SHOCK WAVE LITHOTRIPSY Left 10/18/2019   Procedure: EXTRACORPOREAL SHOCK WAVE LITHOTRIPSY (ESWL);  Surgeon: Heloise Purpura, MD;  Location: [facility];  Service: Urology;  Laterality: Left;   INGUINAL HERNIA REPAIR Right 06/30/2019   Procedure: RIGHT INGUINAL HERNIA REPAIR WITH MESH;  Surgeon: Manus Rudd, MD;  Location:  SURGERY CENTER;  Service: General;  Laterality: Right;   INTRAOPERATIVE CHOLANGIOGRAM N/A 06/13/2020   Procedure: INTRAOPERATIVE CHOLANGIOGRAM;  Surgeon: Manus Rudd, MD;  Location: [facility] OR;  Service: General;  Laterality: N/A;   KNEE ARTHROSCOPY Left    LUMBAR LAMINECTOMY/DECOMPRESSION MICRODISCECTOMY N/A 08/28/2020   Procedure: Laminectomy - Lumbar One-Two, Lumbar Two-Three, Lumbar Three-Four, Lumbar Four-Five, Lumbar Five-Sacral One;  Surgeon: Tia Alert, MD;  Location: [facility] OR;  Service: Neurosurgery;  Laterality: N/A;  posterior   REVERSE SHOULDER ARTHROPLASTY Right 09/06/2022   Procedure: REVERSE Right SHOULDER ARTHROPLASTY;  Surgeon: Beverely Low, MD;  Location: [facility] ORS;  Service: Orthopedics;  Laterality:  Right;  120 hours choice and interscalene block   SHOULDER ARTHROSCOPY Bilateral    TONSILLECTOMY      ALLERGIES: No Known Allergies  FAMILY HISTORY: Family History  Problem Relation Age of Onset   Heart Problems Father    CVA Father     SOCIAL HISTORY: Social History   Tobacco Use   Smoking status: Never   Smokeless tobacco: Never  Vaping Use   Vaping status: Never Used  Substance Use Topics   Alcohol use: Never   Drug use: Never   Social History   Social History Narrative   Are you right handed or left handed? Right   Are you currently employed  ?    What is your current occupation? retired   Do you live at home alone?   Who lives with you? wife   What type of home do you live in: 1 story or 2 story? one    Caffiene 1 cup of coffee and 1 soda a day     OBJECTIVE: PHYSICAL EXAM: BP (!) 147/73   Pulse 78   Ht 5\' 10"  (1.778 m)   Wt 214 lb (97.1 kg)   SpO2 95%   BMI 30.71 kg/m   General: General appearance: Awake and alert. No distress. Cooperative with exam.  Skin: No obvious rash or jaundice. HEENT: Atraumatic. Anicteric. Lungs: Non-labored breathing on room air  Extremities: No edema. Hammer toes bilaterally. Normal arches.  Psych: Affect appropriate.  Neurological: Mental Status: Alert. Speech fluent. No pseudobulbar affect Cranial Nerves: CNII: No RAPD. Visual fields grossly intact. CNIII, IV, VI: PERRL. No nystagmus. EOMI. CN V: Facial sensation intact bilaterally to fine touch. CN VII: Facial muscles symmetric and strong. No ptosis at rest CN VIII: Hearing grossly intact bilaterally. CN IX: No hypophonia. CN X: Palate elevates symmetrically. CN XI: Full strength shoulder shrug bilaterally. CN XII: Tongue protrusion full and midline. No atrophy or fasciculations. No significant dysarthria Motor: Tone is normal. Strength is 5/5 in bilateral upper and lower extremities. Reflexes:  Right Left   Bicep 1+ 1+   Tricep 1+ 1+   BrRad 1+ 1+   Knee 2+ 0 Left knee replaced  Ankle 0 0    Pathological Reflexes: Babinski: flexor response bilaterally Hoffman: absent bilaterally Troemner: absent bilaterally Sensation: Pinprick: Intact except in left lateral lower leg (50% of right) Vibration: 3 seconds in bilateral great toes, stronger in ankles and patella Proprioception: Intact in bilateral great toes Coordination: Intact finger-to- nose-finger bilaterally. Romberg negative. Gait: Able to rise from chair with arms crossed unassisted. Normal, narrow-based gait. Able to tandem walk. Able to walk on toes and  heels.  Lab and Test Review: External labs: 12/19/23: Lipid panel: tChol 132, LDL 70, TG 114 TSH wnl B12: 674 CMP significant for glucose of 115 CBC significant for MCV 96.8 HbA1c: 6.8  Imaging/Procedures: MRI lumbar spine wo contrast (07/15/20): FINDINGS: Segmentation:   Alignment: Mild retrolisthesis L1-2, L2-3. Mild anterolisthesis L4-5 and mild retrolisthesis L5-S1. Mild scoliosis.   Vertebrae:  Normal bone marrow.  Negative for fracture or mass.   Conus medullaris and cauda equina: Conus extends to the L2 level. Conus and cauda equina appear normal.   Paraspinal and other soft tissues: Negative for paraspinous mass or adenopathy. No fluid collection identified   Disc levels:   T12-L1: Diffuse bulging of the disc with broad-based central disc protrusion. Small left foraminal disc protrusion with left foraminal stenosis. Mild narrowing of the spinal canal.   L1-2: Disc degeneration with  diffuse endplate spurring. Bilateral facet and ligamentum flavum hypertrophy. Moderate spinal stenosis and moderate subarticular and foraminal stenosis bilaterally.   L2-3: Disc degeneration with left paracentral disc protrusion and diffuse disc bulging. Bilateral facet hypertrophy. Moderate spinal stenosis. Moderate subarticular stenosis bilaterally left greater than right.   L3-4: Diffuse disc bulging with endplate spurring. Severe facet and ligamentum flavum hypertrophy. Moderate to severe spinal stenosis. Small extraforaminal disc protrusion on the right. Severe subarticular and foraminal stenosis on the right. Moderate subarticular and foraminal stenosis on the left.   L4-5: Central disc protrusion. Diffuse disc bulging with endplate spurring and moderate facet hypertrophy. Severe spinal stenosis. Severe subarticular and foraminal stenosis bilaterally   L5-S1: Disc degeneration with diffuse endplate spurring. Left foraminal disc protrusion. Severe subarticular and  foraminal stenosis bilaterally, left greater than right. Mild spinal stenosis.   IMPRESSION: Severe degenerative change throughout the lumbar spine as above. Multilevel spinal and foraminal stenosis   Moderate spinal stenosis L1-2, L2-3, L3-4.   Severe spinal stenosis L4-5 with severe subarticular and foraminal stenosis bilaterally. Severe spinal stenosis L5-S1 with severe subarticular and foraminal stenosis bilaterally.  EMG Chales Salmon, MD 12/11/23):      ASSESSMENT: Duane Greene is a 73 y.o. male who presents for evaluation of numbness, tingling, and burning in feet. He has a relevant medical history of DM, HTN, HLD, OSA on CPAP, vit D deficiency, lumbar radiculopathy s/p L1-S1 decompression and laminectomies, psoriasis, OA, optic nerve atrophy, PE (02/2023). His neurological examination is pertinent for diminished sensation in bilateral feet and in lateral aspect of left lower leg, the later of which patient states is chronic since back surgery. Available diagnostic data is significant for normal B12, HbA1c of 6.8. Patient's symptoms are likely a combination of residuals of spine disease and a mild distal symmetric polyneuropathy. I'm not sure recent EMG captures this well, but I do not think a repeat EMG is needed at this time, particularly because his symptoms are well controlled. His only known risk factor for neuropathy is diabetes. I will send labs to look for other treatable causes.  PLAN: -Blood work: folate, MMA, IFE -Continue gabapentin 300 mg at bedtime -Continue alpha lipoic acid 600 mg daily -Lidocaine cream PRN -Discussed the importance of DM control  -Return to clinic as needed, but patient preference  The impression above as well as the plan as outlined below were extensively discussed with the patient who voiced understanding. All questions were answered to their satisfaction.  The patient was counseled on pertinent fall precautions per the printed material  provided today, and as noted under the "Patient Instructions" section below.  When available, results of the above investigations and possible further recommendations will be communicated to the patient via telephone/MyChart. Patient to call office if not contacted after expected testing turnaround time.   Total time spent reviewing records, interview, history/exam, documentation, and coordination of care on day of encounter:  70 min   Thank you for allowing me to participate in patient's care.  If I can answer any additional questions, I would be pleased to do so.  Jacquelyne Balint, MD   CC: Emilio Aspen, MD [address] [place] [address]  CC: Referring provider: Emilio Aspen, MD [address] [place],  [place] [address]

## 2024-02-06 ENCOUNTER — Ambulatory Visit: Payer: PPO | Admitting: Neurology

## 2024-02-06 ENCOUNTER — Encounter: Payer: Self-pay | Admitting: Neurology

## 2024-02-06 ENCOUNTER — Other Ambulatory Visit

## 2024-02-06 VITALS — BP 147/73 | HR 78 | Ht 70.0 in | Wt 214.0 lb

## 2024-02-06 DIAGNOSIS — E1142 Type 2 diabetes mellitus with diabetic polyneuropathy: Secondary | ICD-10-CM | POA: Diagnosis not present

## 2024-02-06 DIAGNOSIS — M51371 Other intervertebral disc degeneration, lumbosacral region with lower extremity pain only: Secondary | ICD-10-CM | POA: Diagnosis not present

## 2024-02-06 DIAGNOSIS — Z9889 Other specified postprocedural states: Secondary | ICD-10-CM | POA: Diagnosis not present

## 2024-02-06 DIAGNOSIS — G629 Polyneuropathy, unspecified: Secondary | ICD-10-CM | POA: Diagnosis not present

## 2024-02-06 NOTE — Patient Instructions (Addendum)
 I saw you today for numbness, tingling, and burning in your feet. I think your symptoms are a combination of your previous back issues and a mild peripheral neuropathy (nerve damage in your feet). Your only known risk factor for neuropathy currently is mild diabetes. Controlling this will help reduce the risk of future worsening of your symptoms.  I would like to check labs today to look for other treatable causes. I will be in touch when I have those results.  Continue gabapentin 300 mg at bedtime.  Alpha lipoic acid 600mg  daily has some research data suggesting it helps with nerve health. No major side effects other than <1% of people report upset stomach. This can be taken twice per day (1200mg  daily) if no relief obtained. You can buy this over the counter or online.  You can also try Lidocaine cream as needed. Apply wear you have pain, tingling, or burning. Wear gloves to prevent your hands being numb. This can be bought over the counter at any drug store or online.  You would like to follow up with me as needed. Please let me know if you have any questions or concerns.  The physicians and staff at [facility] are committed to providing excellent care. You may receive a survey requesting feedback about your experience at our office. We strive to receive "very good" responses to the survey questions. If you feel that your experience would prevent you from giving the office a "very good " response, please contact our office to try to remedy the situation. We may be reached at [phone]. Thank you for taking the time out of your busy day to complete the survey.  Jacquelyne Balint, MD [facility]  Preventing Falls at [facility] are common, often dreaded events in the lives of older people. Aside from the obvious injuries and even death that may result, fall can cause wide-ranging consequences including loss of independence, mental decline, decreased activity and mobility. Younger people  are also at risk of falling, especially those with chronic illnesses and fatigue.  Ways to reduce risk for falling Examine diet and medications. Warm foods and alcohol dilate blood vessels, which can lead to dizziness when standing. Sleep aids, antidepressants and pain medications can also increase the likelihood of a fall.  Get a vision exam. Poor vision, cataracts and glaucoma increase the chances of falling.  Check foot gear. Shoes should fit snugly and have a sturdy, nonskid sole and a broad, low heel  Participate in a physician-approved exercise program to build and maintain muscle strength and improve balance and coordination. Programs that use ankle weights or stretch bands are excellent for muscle-strengthening. Water aerobics programs and low-impact Tai Chi programs have also been shown to improve balance and coordination.  Increase vitamin D intake. Vitamin D improves muscle strength and increases the amount of calcium the body is able to absorb and deposit in bones.  How to prevent falls from common hazards Floors - Remove all loose wires, cords, and throw rugs. Minimize clutter. Make sure rugs are anchored and smooth. Keep furniture in its usual place.  Chairs -- Use chairs with straight backs, armrests and firm seats. Add firm cushions to existing pieces to add height.  Bathroom - Install grab bars and non-skid tape in the tub or shower. Use a bathtub transfer bench or a shower chair with a back support Use an elevated toilet seat and/or safety rails to assist standing from a low surface. Do not use towel racks or  bathroom tissue holders to help you stand.  Lighting - Make sure halls, stairways, and entrances are well-lit. Install a night light in your bathroom or hallway. Make sure there is a light switch at the top and bottom of the staircase. Turn lights on if you get up in the middle of the night. Make sure lamps or light switches are within reach of the bed if you have to get up  during the night.  Kitchen - Install non-skid rubber mats near the sink and stove. Clean spills immediately. Store frequently used utensils, pots, pans between waist and eye level. This helps prevent reaching and bending. Sit when getting things out of lower cupboards.  Living room/ Bedrooms - Place furniture with wide spaces in between, giving enough room to move around. Establish a route through the living room that gives you something to hold onto as you walk.  Stairs - Make sure treads, rails, and rugs are secure. Install a rail on both sides of the stairs. If stairs are a threat, it might be helpful to arrange most of your activities on the lower level to reduce the number of times you must climb the stairs.  Entrances and doorways - Install metal handles on the walls adjacent to the doorknobs of all doors to make it more secure as you travel through the doorway.  Tips for maintaining balance Keep at least one hand free at all times. Try using a backpack or fanny pack to hold things rather than carrying them in your hands. Never carry objects in both hands when walking as this interferes with keeping your balance.  Attempt to swing both arms from front to back while walking. This might require a conscious effort if Parkinson's disease has diminished your movement. It will, however, help you to maintain balance and posture, and reduce fatigue.  Consciously lift your feet off of the ground when walking. Shuffling and dragging of the feet is a common culprit in losing your balance.  When trying to navigate turns, use a "U" technique of facing forward and making a wide turn, rather than pivoting sharply.  Try to stand with your feet shoulder-length apart. When your feet are close together for any length of time, you increase your risk of losing your balance and falling.  Do one thing at a time. Don't try to walk and accomplish another task, such as reading or looking around. The decrease in  your automatic reflexes complicates motor function, so the less distraction, the better.  Do not wear rubber or gripping soled shoes, they might "catch" on the floor and cause tripping.  Move slowly when changing positions. Use deliberate, concentrated movements and, if needed, use a grab bar or walking aid. Count 15 seconds between each movement. For example, when rising from a seated position, wait 15 seconds after standing to begin walking.  If balance is a continuous problem, you might want to consider a walking aid such as a cane, walking stick, or walker. Once you've mastered walking with help, you might be ready to try it on your own again.

## 2024-02-10 ENCOUNTER — Encounter: Payer: Self-pay | Admitting: Neurology

## 2024-02-10 LAB — FOLATE: Folate: >24 ng/mL

## 2024-02-10 LAB — IMMUNOFIXATION ELECTROPHORESIS
IgG (Immunoglobin G), Serum: 1143 mg/dL (ref 600–1540)
IgM, Serum: 101 mg/dL (ref 50–300)
Immunoglobulin A: 341 mg/dL — ABNORMAL HIGH (ref 70–320)

## 2024-02-10 LAB — METHYLMALONIC ACID, SERUM: Methylmalonic Acid, Quant: 232 nmol/L (ref 69–390)

## 2024-03-17 DIAGNOSIS — M13861 Other specified arthritis, right knee: Secondary | ICD-10-CM | POA: Diagnosis not present

## 2024-03-23 ENCOUNTER — Other Ambulatory Visit (HOSPITAL_BASED_OUTPATIENT_CLINIC_OR_DEPARTMENT_OTHER): Payer: Self-pay

## 2024-03-23 DIAGNOSIS — R7303 Prediabetes: Secondary | ICD-10-CM | POA: Diagnosis not present

## 2024-03-23 DIAGNOSIS — M5386 Other specified dorsopathies, lumbar region: Secondary | ICD-10-CM | POA: Diagnosis not present

## 2024-03-23 MED ORDER — NAPROXEN 500 MG PO TABS
500.0000 mg | ORAL_TABLET | Freq: Two times a day (BID) | ORAL | 0 refills | Status: DC | PRN
  Filled 2024-03-23: qty 30, 15d supply, fill #0

## 2024-03-23 MED ORDER — CYCLOBENZAPRINE HCL 10 MG PO TABS
10.0000 mg | ORAL_TABLET | Freq: Two times a day (BID) | ORAL | 0 refills | Status: DC
  Filled 2024-03-23: qty 30, 15d supply, fill #0

## 2024-05-05 ENCOUNTER — Encounter (HOSPITAL_BASED_OUTPATIENT_CLINIC_OR_DEPARTMENT_OTHER): Payer: Self-pay

## 2024-05-05 ENCOUNTER — Other Ambulatory Visit (HOSPITAL_BASED_OUTPATIENT_CLINIC_OR_DEPARTMENT_OTHER): Payer: Self-pay

## 2024-05-06 ENCOUNTER — Other Ambulatory Visit (HOSPITAL_BASED_OUTPATIENT_CLINIC_OR_DEPARTMENT_OTHER): Payer: Self-pay

## 2024-05-06 MED ORDER — LOSARTAN POTASSIUM-HCTZ 50-12.5 MG PO TABS
1.0000 | ORAL_TABLET | Freq: Every day | ORAL | 3 refills | Status: AC
  Filled 2024-05-06: qty 100, 100d supply, fill #0
  Filled 2024-08-20: qty 100, 100d supply, fill #1
  Filled 2024-12-04: qty 100, 100d supply, fill #2

## 2024-06-08 DIAGNOSIS — H2513 Age-related nuclear cataract, bilateral: Secondary | ICD-10-CM | POA: Diagnosis not present

## 2024-06-08 DIAGNOSIS — H52203 Unspecified astigmatism, bilateral: Secondary | ICD-10-CM | POA: Diagnosis not present

## 2024-06-08 DIAGNOSIS — H4720 Unspecified optic atrophy: Secondary | ICD-10-CM | POA: Diagnosis not present

## 2024-06-08 DIAGNOSIS — H5340 Unspecified visual field defects: Secondary | ICD-10-CM | POA: Diagnosis not present

## 2024-06-09 DIAGNOSIS — M1612 Unilateral primary osteoarthritis, left hip: Secondary | ICD-10-CM | POA: Diagnosis not present

## 2024-06-28 ENCOUNTER — Other Ambulatory Visit (HOSPITAL_BASED_OUTPATIENT_CLINIC_OR_DEPARTMENT_OTHER): Payer: Self-pay

## 2024-06-28 MED ORDER — AMOXICILLIN 500 MG PO CAPS
500.0000 mg | ORAL_CAPSULE | ORAL | 0 refills | Status: DC
  Filled 2024-06-28: qty 4, 1d supply, fill #0

## 2024-07-05 ENCOUNTER — Other Ambulatory Visit (HOSPITAL_BASED_OUTPATIENT_CLINIC_OR_DEPARTMENT_OTHER): Payer: Self-pay

## 2024-07-05 MED ORDER — TRIAMCINOLONE ACETONIDE 0.1 % EX CREA
1.0000 | TOPICAL_CREAM | Freq: Every day | CUTANEOUS | 3 refills | Status: DC | PRN
  Filled 2024-07-05: qty 15, 15d supply, fill #0

## 2024-07-07 DIAGNOSIS — M13861 Other specified arthritis, right knee: Secondary | ICD-10-CM | POA: Diagnosis not present

## 2024-08-26 DIAGNOSIS — E782 Mixed hyperlipidemia: Secondary | ICD-10-CM | POA: Diagnosis not present

## 2024-08-26 DIAGNOSIS — R7303 Prediabetes: Secondary | ICD-10-CM | POA: Diagnosis not present

## 2024-08-26 DIAGNOSIS — Z23 Encounter for immunization: Secondary | ICD-10-CM | POA: Diagnosis not present

## 2024-08-26 DIAGNOSIS — Z01818 Encounter for other preprocedural examination: Secondary | ICD-10-CM | POA: Diagnosis not present

## 2024-08-26 DIAGNOSIS — I10 Essential (primary) hypertension: Secondary | ICD-10-CM | POA: Diagnosis not present

## 2024-08-26 DIAGNOSIS — Z125 Encounter for screening for malignant neoplasm of prostate: Secondary | ICD-10-CM | POA: Diagnosis not present

## 2024-08-26 DIAGNOSIS — M1711 Unilateral primary osteoarthritis, right knee: Secondary | ICD-10-CM | POA: Diagnosis not present

## 2024-08-26 DIAGNOSIS — I2699 Other pulmonary embolism without acute cor pulmonale: Secondary | ICD-10-CM | POA: Diagnosis not present

## 2024-09-14 ENCOUNTER — Other Ambulatory Visit: Payer: Self-pay

## 2024-09-16 ENCOUNTER — Other Ambulatory Visit (HOSPITAL_BASED_OUTPATIENT_CLINIC_OR_DEPARTMENT_OTHER): Payer: Self-pay

## 2024-09-28 ENCOUNTER — Other Ambulatory Visit (HOSPITAL_BASED_OUTPATIENT_CLINIC_OR_DEPARTMENT_OTHER): Payer: Self-pay

## 2024-09-29 ENCOUNTER — Other Ambulatory Visit (HOSPITAL_BASED_OUTPATIENT_CLINIC_OR_DEPARTMENT_OTHER): Payer: Self-pay

## 2024-09-29 DIAGNOSIS — D1801 Hemangioma of skin and subcutaneous tissue: Secondary | ICD-10-CM | POA: Diagnosis not present

## 2024-09-29 DIAGNOSIS — D2262 Melanocytic nevi of left upper limb, including shoulder: Secondary | ICD-10-CM | POA: Diagnosis not present

## 2024-09-29 DIAGNOSIS — L570 Actinic keratosis: Secondary | ICD-10-CM | POA: Diagnosis not present

## 2024-09-29 DIAGNOSIS — L821 Other seborrheic keratosis: Secondary | ICD-10-CM | POA: Diagnosis not present

## 2024-09-29 DIAGNOSIS — D225 Melanocytic nevi of trunk: Secondary | ICD-10-CM | POA: Diagnosis not present

## 2024-09-29 DIAGNOSIS — L814 Other melanin hyperpigmentation: Secondary | ICD-10-CM | POA: Diagnosis not present

## 2024-09-29 DIAGNOSIS — D2261 Melanocytic nevi of right upper limb, including shoulder: Secondary | ICD-10-CM | POA: Diagnosis not present

## 2024-09-29 DIAGNOSIS — D692 Other nonthrombocytopenic purpura: Secondary | ICD-10-CM | POA: Diagnosis not present

## 2024-09-29 MED ORDER — TERBINAFINE HCL 250 MG PO TABS
250.0000 mg | ORAL_TABLET | ORAL | 2 refills | Status: AC
  Filled 2024-09-29: qty 12, 84d supply, fill #0

## 2024-10-12 ENCOUNTER — Other Ambulatory Visit (HOSPITAL_BASED_OUTPATIENT_CLINIC_OR_DEPARTMENT_OTHER): Payer: Self-pay

## 2024-10-12 DIAGNOSIS — M1711 Unilateral primary osteoarthritis, right knee: Secondary | ICD-10-CM | POA: Diagnosis not present

## 2024-10-12 MED ORDER — ELIQUIS 5 MG PO TABS
5.0000 mg | ORAL_TABLET | Freq: Two times a day (BID) | ORAL | 0 refills | Status: DC
  Filled 2024-10-12: qty 60, 30d supply, fill #0

## 2024-10-16 ENCOUNTER — Other Ambulatory Visit (HOSPITAL_BASED_OUTPATIENT_CLINIC_OR_DEPARTMENT_OTHER): Payer: Self-pay

## 2024-10-19 DIAGNOSIS — M1611 Unilateral primary osteoarthritis, right hip: Secondary | ICD-10-CM | POA: Diagnosis not present

## 2024-10-19 DIAGNOSIS — M25551 Pain in right hip: Secondary | ICD-10-CM | POA: Diagnosis not present

## 2024-10-19 DIAGNOSIS — M1612 Unilateral primary osteoarthritis, left hip: Secondary | ICD-10-CM | POA: Diagnosis not present

## 2024-10-20 DIAGNOSIS — I2699 Other pulmonary embolism without acute cor pulmonale: Secondary | ICD-10-CM | POA: Diagnosis not present

## 2024-10-20 DIAGNOSIS — H4720 Unspecified optic atrophy: Secondary | ICD-10-CM | POA: Diagnosis not present

## 2024-10-20 DIAGNOSIS — Z0000 Encounter for general adult medical examination without abnormal findings: Secondary | ICD-10-CM | POA: Diagnosis not present

## 2024-10-20 DIAGNOSIS — G4733 Obstructive sleep apnea (adult) (pediatric): Secondary | ICD-10-CM | POA: Diagnosis not present

## 2024-10-20 DIAGNOSIS — I10 Essential (primary) hypertension: Secondary | ICD-10-CM | POA: Diagnosis not present

## 2024-10-20 DIAGNOSIS — M1612 Unilateral primary osteoarthritis, left hip: Secondary | ICD-10-CM | POA: Diagnosis not present

## 2024-10-20 DIAGNOSIS — E782 Mixed hyperlipidemia: Secondary | ICD-10-CM | POA: Diagnosis not present

## 2024-10-20 DIAGNOSIS — M1611 Unilateral primary osteoarthritis, right hip: Secondary | ICD-10-CM | POA: Diagnosis not present

## 2024-10-20 DIAGNOSIS — R7303 Prediabetes: Secondary | ICD-10-CM | POA: Diagnosis not present

## 2024-10-20 DIAGNOSIS — E559 Vitamin D deficiency, unspecified: Secondary | ICD-10-CM | POA: Diagnosis not present

## 2024-10-20 DIAGNOSIS — M1711 Unilateral primary osteoarthritis, right knee: Secondary | ICD-10-CM | POA: Diagnosis not present

## 2024-10-20 DIAGNOSIS — M47816 Spondylosis without myelopathy or radiculopathy, lumbar region: Secondary | ICD-10-CM | POA: Diagnosis not present

## 2024-10-20 DIAGNOSIS — Z125 Encounter for screening for malignant neoplasm of prostate: Secondary | ICD-10-CM | POA: Diagnosis not present

## 2024-10-20 DIAGNOSIS — Z1331 Encounter for screening for depression: Secondary | ICD-10-CM | POA: Diagnosis not present

## 2024-10-26 ENCOUNTER — Other Ambulatory Visit: Payer: Self-pay

## 2024-10-26 ENCOUNTER — Encounter (HOSPITAL_BASED_OUTPATIENT_CLINIC_OR_DEPARTMENT_OTHER): Payer: Self-pay

## 2024-10-26 ENCOUNTER — Emergency Department (HOSPITAL_BASED_OUTPATIENT_CLINIC_OR_DEPARTMENT_OTHER): Admitting: Radiology

## 2024-10-26 ENCOUNTER — Other Ambulatory Visit (HOSPITAL_BASED_OUTPATIENT_CLINIC_OR_DEPARTMENT_OTHER): Payer: Self-pay

## 2024-10-26 ENCOUNTER — Emergency Department (HOSPITAL_BASED_OUTPATIENT_CLINIC_OR_DEPARTMENT_OTHER)
Admission: EM | Admit: 2024-10-26 | Discharge: 2024-10-26 | Disposition: A | Discharge status: Home / Self Care | Attending: Emergency Medicine | Admitting: Emergency Medicine

## 2024-10-26 DIAGNOSIS — R1030 Lower abdominal pain, unspecified: Secondary | ICD-10-CM | POA: Diagnosis not present

## 2024-10-26 DIAGNOSIS — Z79899 Other long term (current) drug therapy: Secondary | ICD-10-CM | POA: Diagnosis not present

## 2024-10-26 DIAGNOSIS — Z7901 Long term (current) use of anticoagulants: Secondary | ICD-10-CM | POA: Diagnosis not present

## 2024-10-26 DIAGNOSIS — R32 Unspecified urinary incontinence: Secondary | ICD-10-CM | POA: Diagnosis not present

## 2024-10-26 DIAGNOSIS — M47816 Spondylosis without myelopathy or radiculopathy, lumbar region: Secondary | ICD-10-CM | POA: Diagnosis not present

## 2024-10-26 DIAGNOSIS — M25551 Pain in right hip: Secondary | ICD-10-CM

## 2024-10-26 DIAGNOSIS — M1611 Unilateral primary osteoarthritis, right hip: Secondary | ICD-10-CM | POA: Diagnosis not present

## 2024-10-26 LAB — CBC WITH DIFFERENTIAL/PLATELET
Abs Immature Granulocytes: 0.04 K/uL (ref 0.00–0.07)
Basophils Absolute: 0 K/uL (ref 0.0–0.1)
Basophils Relative: 0 %
Eosinophils Absolute: 0 K/uL (ref 0.0–0.5)
Eosinophils Relative: 0 %
HCT: 42 % (ref 39.0–52.0)
Hemoglobin: 14.8 g/dL (ref 13.0–17.0)
Immature Granulocytes: 0 %
Lymphocytes Relative: 8 %
Lymphs Abs: 0.7 K/uL (ref 0.7–4.0)
MCH: 32.9 pg (ref 26.0–34.0)
MCHC: 35.2 g/dL (ref 30.0–36.0)
MCV: 93.3 fL (ref 80.0–100.0)
Monocytes Absolute: 1.3 K/uL — ABNORMAL HIGH (ref 0.1–1.0)
Monocytes Relative: 14 %
Neutro Abs: 7 K/uL (ref 1.7–7.7)
Neutrophils Relative %: 78 %
Platelets: 147 K/uL — ABNORMAL LOW (ref 150–400)
RBC: 4.5 MIL/uL (ref 4.22–5.81)
RDW: 13 % (ref 11.5–15.5)
WBC: 9 K/uL (ref 4.0–10.5)
nRBC: 0 % (ref 0.0–0.2)

## 2024-10-26 LAB — BASIC METABOLIC PANEL WITH GFR
Anion gap: 10 (ref 5–15)
BUN: 21 mg/dL (ref 8–23)
CO2: 28 mmol/L (ref 22–32)
Calcium: 9.5 mg/dL (ref 8.9–10.3)
Chloride: 98 mmol/L (ref 98–111)
Creatinine, Ser: 1.08 mg/dL (ref 0.61–1.24)
GFR, Estimated: >60 mL/min (ref >60)
Glucose, Bld: 124 mg/dL — ABNORMAL HIGH (ref 70–99)
Potassium: 4.1 mmol/L (ref 3.5–5.1)
Sodium: 136 mmol/L (ref 135–145)

## 2024-10-26 LAB — SEDIMENTATION RATE: Sed Rate: 8 mm/h (ref 0–16)

## 2024-10-26 LAB — C-REACTIVE PROTEIN: CRP: 9.2 mg/dL — ABNORMAL HIGH (ref <1.0)

## 2024-10-26 MED ORDER — KETOROLAC TROMETHAMINE 15 MG/ML IJ SOLN
15.0000 mg | Freq: Once | INTRAMUSCULAR | Status: AC
  Administered 2024-10-26: 15 mg via INTRAMUSCULAR
  Filled 2024-10-26: qty 1

## 2024-10-26 MED ORDER — OXYCODONE HCL 5 MG PO TABS
5.0000 mg | ORAL_TABLET | Freq: Four times a day (QID) | ORAL | 0 refills | Status: DC | PRN
  Filled 2024-10-26: qty 28, 7d supply, fill #0

## 2024-10-26 MED ORDER — OXYCODONE-ACETAMINOPHEN 5-325 MG PO TABS
1.0000 | ORAL_TABLET | Freq: Once | ORAL | Status: AC
  Administered 2024-10-26: 1 via ORAL
  Filled 2024-10-26: qty 1

## 2024-10-26 NOTE — ED Provider Notes (Signed)
  Physical Exam  BP (!) 131/58 (BP Location: Right Arm)   Pulse 77   Temp 98.9 F (37.2 C) (Oral)   Resp 20   Ht 5' 10 (1.778 m)   Wt 97.5 kg   SpO2 96%   BMI 30.85 kg/m   Physical Exam  Procedures  Procedures  ED Course / MDM    Medical Decision Making Amount and/or Complexity of Data Reviewed Labs: ordered. Radiology: ordered.  Risk Prescription drug management.   Patient received in signout.  Hip pain after injection.  Differential diagnosis to include septic arthritis but felt less likely.  Normal white count and no elevated sed rate.  Treated symptomatically.  Given Toradol  and feeling better.  Able to ambulate.  Has follow-up in about 5 hours in the orthopedics office.  Appears stable for discharge home.  No lumbar tenderness although potentially could be radicular from the back.        Patsey Lot, MD 10/26/24 1005

## 2024-10-26 NOTE — Discharge Instructions (Signed)
 Go to the  orthopedic surgeon today as planned.

## 2024-10-26 NOTE — ED Notes (Signed)
 Christian, NT ambulated patient in hallway. Patient reports pain is down by 50%. Dr. Patsey in hallway to witness his ambulation as well.

## 2024-10-26 NOTE — ED Provider Notes (Signed)
 [facility] AT [facility] Provider Note   CSN: [id number] Arrival date & time: 10/26/24  9393     Patient presents with: Groin Pain   Duane Greene is a 73 y.o. male.   The history is provided by the patient.  Groin Pain   He has history of hypertension, hyperlipidemia, pulmonary embolism no longer on anticoagulation and comes in because of pain in his right hip and groin.  He states that he had injections into both hips 1 week ago and was doing well until 2 days ago when he started having pain in the right hip and groin which has been getting worse.  He has taken oxycodone  without relief.  He is okay when laying still, but it is worse with movement or attempts to ambulate.  He has had urgent continence where he is aware that he needs to urinate but it is too painful to get to the bathroom in time.  He denies any numbness or tingling.    Prior to Admission medications   Medication Sig Start Date End Date Taking? Authorizing Provider  amoxicillin  (AMOXIL ) 500 MG capsule Take 1 capsule (500 mg total) by mouth 30 minutes to 1 hour prior to procedure 06/28/24     apixaban  (ELIQUIS ) 5 MG TABS tablet Take 1 tablet (5 mg total) by mouth 2 (two) times daily. 10/12/24     celecoxib  (CELEBREX ) 100 MG capsule Take 1 capsule (100 mg total) by mouth 2 (two) times daily. 01/27/24     cholecalciferol  (VITAMIN D3) 25 MCG (1000 UNIT) tablet Take 1,000 Units by mouth daily.    [provider]  cyclobenzaprine  (FLEXERIL ) 10 MG tablet Take 1 tablet (10 mg total) by mouth 2 (two) times daily as needed. 03/23/24     gabapentin  (NEURONTIN ) 300 MG capsule Take 1 capsule (300 mg total) by mouth at bedtime. 12/31/23     losartan -hydrochlorothiazide  (HYZAAR ) 50-12.5 MG tablet Take 1 tablet by mouth daily. 10/08/22     losartan -hydrochlorothiazide  (HYZAAR ) 50-12.5 MG tablet Take 1 tablet by mouth daily. 05/06/24     meclizine  (ANTIVERT ) 12.5 MG tablet Take 1-2 tablets (12.5-25 mg  total) by mouth every 12 (twelve) hours as needed for vertigo Patient not taking: Reported on 02/06/2024 12/09/23     methocarbamol  (ROBAXIN ) 500 MG tablet Take 1 tablet (500 mg total) by mouth 4 (four) times daily. Patient not taking: Reported on 02/06/2024 09/06/22   Duane Kemps, MD  Multiple Vitamins-Minerals (MULTIVITAMIN WITH MINERALS) tablet Take 1 tablet by mouth at bedtime.    [provider]  mupirocin  ointment (BACTROBAN ) 2 % Apply 1 Application topically 2 (two) times daily to affected areas 11/25/23     naproxen  (NAPROSYN ) 500 MG tablet Take 1 tablet (500 mg total) by mouth every 12 (twelve) hours as needed with food or milk 03/23/24     NON FORMULARY Pt uses a cpap nightly    [provider]  ondansetron  (ZOFRAN ) 8 MG tablet Take 1 tablet (8 mg total) by mouth 2 (two) times daily as needed for nausea or vomiting Patient not taking: Reported on 02/06/2024 09/08/23     simvastatin  (ZOCOR ) 40 MG tablet Take 1 tablet (40 mg total) by mouth every evening. 12/29/23     temazepam  (RESTORIL ) 30 MG capsule Take 30 mg by mouth at bedtime as needed for sleep. Patient not taking: Reported on 02/06/2024 09/13/22   [provider]  terbinafine  (LAMISIL ) 250 MG tablet Take 1 tablet (250 mg total) by  mouth once a week to prevent onychomycosis  (nail fungus). Patient not taking: Reported on 02/06/2024 08/07/22     terbinafine  (LAMISIL ) 250 MG tablet Take 1 tablet (250 mg total) by mouth every 7 (seven) days to prevent onychomycosis. 09/29/24     triamcinolone  cream (KENALOG ) 0.1 % Apply 1 Application topically daily as needed. 07/05/24     zolpidem  (AMBIEN ) 5 MG tablet Take 1 tablet (5 mg total) by mouth daily as needed for insomnia Patient not taking: Reported on 02/06/2024 09/08/23       Allergies: Patient has no known allergies.    Review of Systems  All other systems reviewed and are negative.   Updated Vital Signs BP (!) 131/58 (BP Location: Right Arm)   Pulse 77   Temp  98.9 F (37.2 C) (Oral)   Resp 20   Ht 5' 10 (1.778 m)   Wt 97.5 kg   SpO2 96%   BMI 30.85 kg/m   Physical Exam Vitals and nursing note reviewed.   73 year old male, resting comfortably and in no acute distress. Vital signs are normal. Oxygen saturation is 96%, which is normal. Head is normocephalic and atraumatic. PERRLA, EOMI.  Back is nontender. Lungs are clear without rales, wheezes, or rhonchi. Heart has regular rate and rhythm without murmur. Abdomen is soft, flat, nontender. Genitalia: Circumcised penis, testes descended without masses or tenderness.  There is tenderness to palpation along the inguinal fold. Extremities: There is no swelling or deformity present.  There is moderate tenderness to palpation over the right hip especially laterally and anteriorly.  There is pain on range of motion of the right hip, full range of motion in all other joints without pain. Skin is warm and dry without rash. Neurologic: Mental status is normal, cranial nerves are intact, strength is 5/5 in all 4 extremities.  Sensation is symmetric throughout.  (all labs ordered are listed, but only abnormal results are displayed) Labs Reviewed - No data to display   Radiology: No results found.   Procedures   Medications Ordered in the ED  oxyCODONE -acetaminophen  (PERCOCET/ROXICET) 5-325 MG per tablet 1 tablet (1 tablet Oral Given 10/26/24 0646)                                    Medical Decision Making  Right hip pain and patient with recent injection.  It is not clear to me whether the injection was actually intra-articular versus bursal.  However, on review of past records, I do see an office visit with a diagnosis of osteoarthritis of left hip joint, osteoarthritis of right hip joint, pain of bilateral hip joints.  Unfortunately, I cannot see the actual office note.  I am ordering x-rays to rule out fracture.  At this point, doubt septic arthritis, but I am ordering screening labs..  I  have ordered oxycodone -acetaminophen  for pain.  Case is signed out to Dr. Patsey.     Final diagnoses:  Pain in right hip    ED Discharge Orders     None          Duane Lenis, MD 10/26/24 [phone]

## 2024-10-26 NOTE — ED Triage Notes (Signed)
 Pt complaining of pain in the groin that started Sunday night. He is also complaining of pain in the right hip as well. History of back pain. Pt said he is having trouble holding his urine.

## 2024-10-29 ENCOUNTER — Inpatient Hospital Stay (HOSPITAL_COMMUNITY): Admission: EM | Admit: 2024-10-29 | Discharge: 2024-11-04 | Attending: Hospitalist | Admitting: Hospitalist

## 2024-10-29 ENCOUNTER — Emergency Department (HOSPITAL_COMMUNITY)

## 2024-10-29 ENCOUNTER — Encounter (HOSPITAL_COMMUNITY): Payer: Self-pay

## 2024-10-29 DIAGNOSIS — M609 Myositis, unspecified: Secondary | ICD-10-CM | POA: Diagnosis present

## 2024-10-29 DIAGNOSIS — Z96611 Presence of right artificial shoulder joint: Secondary | ICD-10-CM | POA: Diagnosis present

## 2024-10-29 DIAGNOSIS — Z79899 Other long term (current) drug therapy: Secondary | ICD-10-CM

## 2024-10-29 DIAGNOSIS — Z1152 Encounter for screening for COVID-19: Secondary | ICD-10-CM

## 2024-10-29 DIAGNOSIS — S76011A Strain of muscle, fascia and tendon of right hip, initial encounter: Principal | ICD-10-CM

## 2024-10-29 DIAGNOSIS — E785 Hyperlipidemia, unspecified: Secondary | ICD-10-CM | POA: Diagnosis present

## 2024-10-29 DIAGNOSIS — K5900 Constipation, unspecified: Secondary | ICD-10-CM | POA: Diagnosis present

## 2024-10-29 DIAGNOSIS — G4733 Obstructive sleep apnea (adult) (pediatric): Secondary | ICD-10-CM | POA: Diagnosis present

## 2024-10-29 DIAGNOSIS — I10 Essential (primary) hypertension: Secondary | ICD-10-CM | POA: Diagnosis present

## 2024-10-29 DIAGNOSIS — M160 Bilateral primary osteoarthritis of hip: Secondary | ICD-10-CM | POA: Diagnosis present

## 2024-10-29 DIAGNOSIS — B9561 Methicillin susceptible Staphylococcus aureus infection as the cause of diseases classified elsewhere: Secondary | ICD-10-CM | POA: Diagnosis present

## 2024-10-29 DIAGNOSIS — Z66 Do not resuscitate: Secondary | ICD-10-CM | POA: Diagnosis present

## 2024-10-29 DIAGNOSIS — Z823 Family history of stroke: Secondary | ICD-10-CM

## 2024-10-29 DIAGNOSIS — E860 Dehydration: Secondary | ICD-10-CM | POA: Diagnosis present

## 2024-10-29 DIAGNOSIS — I330 Acute and subacute infective endocarditis: Secondary | ICD-10-CM | POA: Diagnosis not present

## 2024-10-29 DIAGNOSIS — M00051 Staphylococcal arthritis, right hip: Principal | ICD-10-CM | POA: Diagnosis present

## 2024-10-29 DIAGNOSIS — Z7409 Other reduced mobility: Secondary | ICD-10-CM | POA: Diagnosis present

## 2024-10-29 DIAGNOSIS — E871 Hypo-osmolality and hyponatremia: Secondary | ICD-10-CM | POA: Diagnosis not present

## 2024-10-29 DIAGNOSIS — K759 Inflammatory liver disease, unspecified: Secondary | ICD-10-CM | POA: Diagnosis present

## 2024-10-29 LAB — CBC WITH DIFFERENTIAL/PLATELET
Abs Immature Granulocytes: 0.06 K/uL (ref 0.00–0.07)
Basophils Absolute: 0 K/uL (ref 0.0–0.1)
Basophils Relative: 0 %
Eosinophils Absolute: 0 K/uL (ref 0.0–0.5)
Eosinophils Relative: 0 %
HCT: 44.4 % (ref 39.0–52.0)
Hemoglobin: 15.3 g/dL (ref 13.0–17.0)
Immature Granulocytes: 1 %
Lymphocytes Relative: 7 %
Lymphs Abs: 0.9 K/uL (ref 0.7–4.0)
MCH: 32.5 pg (ref 26.0–34.0)
MCHC: 34.5 g/dL (ref 30.0–36.0)
MCV: 94.3 fL (ref 80.0–100.0)
Monocytes Absolute: 1.9 K/uL — ABNORMAL HIGH (ref 0.1–1.0)
Monocytes Relative: 15 %
Neutro Abs: 10 K/uL — ABNORMAL HIGH (ref 1.7–7.7)
Neutrophils Relative %: 77 %
Platelets: 124 K/uL — ABNORMAL LOW (ref 150–400)
RBC: 4.71 MIL/uL (ref 4.22–5.81)
RDW: 13.1 % (ref 11.5–15.5)
WBC: 12.9 K/uL — ABNORMAL HIGH (ref 4.0–10.5)
nRBC: 0 % (ref 0.0–0.2)

## 2024-10-29 LAB — URINALYSIS, W/ REFLEX TO CULTURE (INFECTION SUSPECTED)
Bilirubin Urine: NEGATIVE
Glucose, UA: NEGATIVE mg/dL
Ketones, ur: 5 mg/dL — AB
Leukocytes,Ua: NEGATIVE
Nitrite: NEGATIVE
Protein, ur: 30 mg/dL — AB
Specific Gravity, Urine: >1.06 — ABNORMAL HIGH (ref 1.005–1.030)
pH: 5 (ref 5.0–8.0)

## 2024-10-29 LAB — COMPREHENSIVE METABOLIC PANEL WITH GFR
ALT: 55 U/L — ABNORMAL HIGH (ref 0–44)
AST: 48 U/L — ABNORMAL HIGH (ref 15–41)
Albumin: 3.1 g/dL — ABNORMAL LOW (ref 3.5–5.0)
Alkaline Phosphatase: 98 U/L (ref 38–126)
Anion gap: 12 (ref 5–15)
BUN: 20 mg/dL (ref 8–23)
CO2: 27 mmol/L (ref 22–32)
Calcium: 8.3 mg/dL — ABNORMAL LOW (ref 8.9–10.3)
Chloride: 93 mmol/L — ABNORMAL LOW (ref 98–111)
Creatinine, Ser: 1.01 mg/dL (ref 0.61–1.24)
GFR, Estimated: >60 mL/min (ref >60)
Glucose, Bld: 114 mg/dL — ABNORMAL HIGH (ref 70–99)
Potassium: 3.5 mmol/L (ref 3.5–5.1)
Sodium: 132 mmol/L — ABNORMAL LOW (ref 135–145)
Total Bilirubin: 1.8 mg/dL — ABNORMAL HIGH (ref 0.0–1.2)
Total Protein: 7.1 g/dL (ref 6.5–8.1)

## 2024-10-29 LAB — RESP PANEL BY RT-PCR (RSV, FLU A&B, COVID)  RVPGX2
Influenza A by PCR: NEGATIVE
Influenza B by PCR: NEGATIVE
Resp Syncytial Virus by PCR: NEGATIVE
SARS Coronavirus 2 by RT PCR: NEGATIVE

## 2024-10-29 LAB — PROTIME-INR
INR: 1.2 (ref 0.8–1.2)
Prothrombin Time: 16 s — ABNORMAL HIGH (ref 11.4–15.2)

## 2024-10-29 LAB — I-STAT CG4 LACTIC ACID, ED
Lactic Acid, Venous: 2 mmol/L (ref 0.5–1.9)
Lactic Acid, Venous: 1.1 mmol/L (ref 0.5–1.9)

## 2024-10-29 MED ORDER — SODIUM CHLORIDE 0.9 % IV BOLUS
1000.0000 mL | Freq: Once | INTRAVENOUS | Status: AC
  Administered 2024-10-29: 1000 mL via INTRAVENOUS

## 2024-10-29 MED ORDER — LIDOCAINE 5 % EX PTCH
1.0000 | MEDICATED_PATCH | CUTANEOUS | Status: DC
  Administered 2024-10-29 – 2024-11-03 (×6): 1 via TRANSDERMAL
  Filled 2024-10-29 (×6): qty 1

## 2024-10-29 MED ORDER — IOHEXOL 350 MG/ML SOLN
75.0000 mL | Freq: Once | INTRAVENOUS | Status: AC | PRN
  Administered 2024-10-29: 75 mL via INTRAVENOUS

## 2024-10-29 MED ORDER — ACETAMINOPHEN 325 MG PO TABS
650.0000 mg | ORAL_TABLET | ORAL | Status: DC | PRN
  Administered 2024-10-30: 650 mg via ORAL
  Filled 2024-10-29 (×2): qty 2

## 2024-10-29 MED ORDER — CYCLOBENZAPRINE HCL 10 MG PO TABS
10.0000 mg | ORAL_TABLET | Freq: Three times a day (TID) | ORAL | Status: DC | PRN
  Filled 2024-10-29: qty 1

## 2024-10-29 MED ORDER — GADOBUTROL 1 MMOL/ML IV SOLN
9.0000 mL | Freq: Once | INTRAVENOUS | Status: AC | PRN
  Administered 2024-10-29: 9 mL via INTRAVENOUS

## 2024-10-29 MED ORDER — SIMVASTATIN 20 MG PO TABS
40.0000 mg | ORAL_TABLET | Freq: Every evening | ORAL | Status: DC
  Administered 2024-10-29 – 2024-11-03 (×5): 40 mg via ORAL
  Filled 2024-10-29 (×5): qty 2

## 2024-10-29 MED ORDER — OXYCODONE HCL 5 MG PO TABS
5.0000 mg | ORAL_TABLET | Freq: Four times a day (QID) | ORAL | Status: DC | PRN
  Administered 2024-10-29 – 2024-10-30 (×2): 5 mg via ORAL
  Filled 2024-10-29 (×2): qty 1

## 2024-10-29 MED ORDER — GABAPENTIN 300 MG PO CAPS
300.0000 mg | ORAL_CAPSULE | Freq: Every day | ORAL | Status: DC
  Administered 2024-10-29: 300 mg via ORAL
  Filled 2024-10-29: qty 1

## 2024-10-29 MED ORDER — CELECOXIB 100 MG PO CAPS: 100.0000 mg | ORAL_CAPSULE | Freq: Two times a day (BID) | ORAL | Status: DC

## 2024-10-29 MED ORDER — MORPHINE SULFATE (PF) 4 MG/ML IV SOLN
4.0000 mg | Freq: Once | INTRAVENOUS | Status: AC
  Administered 2024-10-29: 4 mg via INTRAVENOUS
  Filled 2024-10-29: qty 1

## 2024-10-29 MED ORDER — VITAMIN D 25 MCG (1000 UNIT) PO TABS
1000.0000 [IU] | ORAL_TABLET | Freq: Every day | ORAL | Status: DC
  Administered 2024-10-30 – 2024-11-04 (×5): 1000 [IU] via ORAL
  Filled 2024-10-29 (×5): qty 1

## 2024-10-29 MED ORDER — LORAZEPAM 1 MG PO TABS
1.0000 mg | ORAL_TABLET | Freq: Once | ORAL | Status: AC
  Administered 2024-10-29: 1 mg via ORAL
  Filled 2024-10-29: qty 1

## 2024-10-29 MED ORDER — TERBINAFINE HCL 250 MG PO TABS: 250.0000 mg | ORAL_TABLET | ORAL | Status: DC

## 2024-10-29 MED ORDER — ADULT MULTIVITAMIN W/MINERALS CH
1.0000 | ORAL_TABLET | Freq: Every day | ORAL | Status: DC
  Administered 2024-10-29 – 2024-11-03 (×6): 1 via ORAL
  Filled 2024-10-29 (×6): qty 1

## 2024-10-29 MED ORDER — ACETAMINOPHEN 325 MG PO TABS
650.0000 mg | ORAL_TABLET | Freq: Once | ORAL | Status: AC
  Administered 2024-10-29: 650 mg via ORAL
  Filled 2024-10-29: qty 2

## 2024-10-29 NOTE — ED Provider Notes (Signed)
 [place] EMERGENCY DEPARTMENT AT [facility] Provider Note   CSN: [id number] Arrival date & time: 10/29/24  1148     Patient presents with: No chief complaint on file.   Duane Greene is a 73 y.o. male.   Patient is a 73 year old male with a past medical history of osteoarthritis and hypertension presenting to the emergency department with right hip pain.  Patient reports that he has had pain in his right hip for the last 2 weeks that has significantly worsened to the point where he is now unable to walk and has significant movement with pain.  He states that he was using a cane and walker previously but now are unable to use this.  He states that he was planned for a right knee replacement on Monday.  He states he has had no trauma or falls.  He denies any numbness but states that his leg is starting to feel weak.  He states that he has been taking oxycodone  and muscle relaxers at home without significant relief.  He was seen by his orthopedics yesterday who is planning on an MRI of his hip but he called back today that he was unable to get out of bed and they recommended that he come to the ED for evaluation.  His wife is also concerned that he may be having some skin breakdown on his bottom and she states that he has been sitting in the recliner most of the week.  The patient states that he has not felt feverish has had no redness or warmth to his hip and has not noticed any swelling.  He denies any recent cough, congestion or sore throat, dysuria or hematuria.  The history is provided by the patient and the spouse.       Prior to Admission medications  Medication Sig Start Date End Date Taking? Authorizing Provider  celecoxib  (CELEBREX ) 100 MG capsule Take 1 capsule (100 mg total) by mouth 2 (two) times daily. 01/27/24     cholecalciferol  (VITAMIN D3) 25 MCG (1000 UNIT) tablet Take 1,000 Units by mouth daily.    [provider]  cyclobenzaprine  (FLEXERIL ) 10 MG  tablet Take 1 tablet (10 mg total) by mouth 2 (two) times daily as needed. 03/23/24     gabapentin  (NEURONTIN ) 300 MG capsule Take 1 capsule (300 mg total) by mouth at bedtime. 12/31/23     losartan -hydrochlorothiazide  (HYZAAR ) 50-12.5 MG tablet Take 1 tablet by mouth daily. 10/08/22     losartan -hydrochlorothiazide  (HYZAAR ) 50-12.5 MG tablet Take 1 tablet by mouth daily. 05/06/24     meclizine  (ANTIVERT ) 12.5 MG tablet Take 1-2 tablets (12.5-25 mg total) by mouth every 12 (twelve) hours as needed for vertigo Patient not taking: Reported on 02/06/2024 12/09/23     methocarbamol  (ROBAXIN ) 500 MG tablet Take 1 tablet (500 mg total) by mouth 4 (four) times daily. Patient not taking: Reported on 02/06/2024 09/06/22   Kay Kemps, MD  Multiple Vitamins-Minerals (MULTIVITAMIN WITH MINERALS) tablet Take 1 tablet by mouth at bedtime.    [provider]  mupirocin  ointment (BACTROBAN ) 2 % Apply 1 Application topically 2 (two) times daily to affected areas 11/25/23     naproxen  (NAPROSYN ) 500 MG tablet Take 1 tablet (500 mg total) by mouth every 12 (twelve) hours as needed with food or milk 03/23/24     NON FORMULARY Pt uses a cpap nightly    [provider]  ondansetron  (ZOFRAN ) 8 MG tablet Take 1 tablet (8 mg total)  by mouth 2 (two) times daily as needed for nausea or vomiting Patient not taking: Reported on 02/06/2024 09/08/23     oxyCODONE  (OXY IR/ROXICODONE ) 5 MG immediate release tablet Take 1 tablet (5 mg total) by mouth every 6 (six) hours as needed for pain for 7 days. 10/26/24     simvastatin  (ZOCOR ) 40 MG tablet Take 1 tablet (40 mg total) by mouth every evening. 12/29/23     temazepam  (RESTORIL ) 30 MG capsule Take 30 mg by mouth at bedtime as needed for sleep. Patient not taking: Reported on 02/06/2024 09/13/22   [provider]  terbinafine  (LAMISIL ) 250 MG tablet Take 1 tablet (250 mg total) by mouth once a week to prevent onychomycosis  (nail fungus). Patient not taking:  Reported on 02/06/2024 08/07/22     terbinafine  (LAMISIL ) 250 MG tablet Take 1 tablet (250 mg total) by mouth every 7 (seven) days to prevent onychomycosis. 09/29/24     triamcinolone  cream (KENALOG ) 0.1 % Apply 1 Application topically daily as needed. 07/05/24     zolpidem  (AMBIEN ) 5 MG tablet Take 1 tablet (5 mg total) by mouth daily as needed for insomnia Patient not taking: Reported on 02/06/2024 09/08/23       Allergies: Patient has no known allergies.    Review of Systems  Updated Vital Signs BP 114/61 (BP Location: Right Arm)   Pulse 66   Temp 98 F (36.7 C) (Oral)   Resp 17   SpO2 97%   Physical Exam Vitals and nursing note reviewed.  Constitutional:      General: He is not in acute distress.    Appearance: Normal appearance.  HENT:     Head: Normocephalic.     Nose: Nose normal.     Mouth/Throat:     Mouth: Mucous membranes are moist.  Eyes:     Extraocular Movements: Extraocular movements intact.     Conjunctiva/sclera: Conjunctivae normal.  Cardiovascular:     Rate and Rhythm: Normal rate and regular rhythm.     Pulses: Normal pulses.     Heart sounds: Normal heart sounds.  Pulmonary:     Effort: Pulmonary effort is normal.     Breath sounds: Normal breath sounds.  Abdominal:     General: Abdomen is flat.     Palpations: Abdomen is soft.     Tenderness: There is no abdominal tenderness.  Musculoskeletal:     Cervical back: Normal range of motion.     Right lower leg: No edema.     Left lower leg: No edema.     Comments: Tenderness to palpation of R gluteus, significant pain with internal/external rotation of R hip, limited R knee flexion 2/2 R hip pain  Skin:    General: Skin is dry.     Comments: Skin hot to touch Erythema on sacrum, no visible skin breakdown though exam limited due to patient in hallway and difficultly rolling 2/2 pain  Neurological:     General: No focal deficit present.     Mental Status: He is alert and oriented to person, place, and  time.     Sensory: No sensory deficit.     Motor: No weakness (5/5 strenght in plantar/dorsiflexion, knee/hip strength exam limited due to pain).  Psychiatric:        Mood and Affect: Mood normal.        Behavior: Behavior normal.     (all labs ordered are listed, but only abnormal results are displayed) Labs Reviewed  CBC WITH  DIFFERENTIAL/PLATELET - Abnormal; Notable for the following components:      Result Value   WBC 12.9 (*)    Platelets 124 (*)    Neutro Abs 10.0 (*)    Monocytes Absolute 1.9 (*)    All other components within normal limits  COMPREHENSIVE METABOLIC PANEL WITH GFR - Abnormal; Notable for the following components:   Sodium 132 (*)    Chloride 93 (*)    Glucose, Bld 114 (*)    Calcium 8.3 (*)    Albumin 3.1 (*)    AST 48 (*)    ALT 55 (*)    Total Bilirubin 1.8 (*)    All other components within normal limits  PROTIME-INR - Abnormal; Notable for the following components:   Prothrombin Time 16.0 (*)    All other components within normal limits  URINALYSIS, W/ REFLEX TO CULTURE (INFECTION SUSPECTED) - Abnormal; Notable for the following components:   Color, Urine AMBER (*)    Specific Gravity, Urine >1.060 (*)    Hgb urine dipstick SMALL (*)    Ketones, ur 5 (*)    Protein, ur 30 (*)    Bacteria, UA RARE (*)    All other components within normal limits  I-STAT CG4 LACTIC ACID, ED - Abnormal; Notable for the following components:   Lactic Acid, Venous 2.0 (*)    All other components within normal limits  RESP PANEL BY RT-PCR (RSV, FLU A&B, COVID)  [id number]  CULTURE, BLOOD (ROUTINE X 2)  CULTURE, BLOOD (ROUTINE X 2)  I-STAT CG4 LACTIC ACID, ED    EKG: EKG Interpretation Date/Time:  Friday October 29 2024 17:29:14 EST Ventricular Rate:  74 PR Interval:  198 QRS Duration:  84 QT Interval:  372 QTC Calculation: 412 R Axis:   -7  Text Interpretation: Normal sinus rhythm Cannot rule out Anterior infarct , age undetermined Abnormal ECG No  significant change since last tracing Confirmed by Ellouise Fine ([id number]) on 10/29/2024 6:27:34 PM  Radiology: CT ABDOMEN PELVIS W CONTRAST Result Date: 10/29/2024 EXAM: CT ABDOMEN AND PELVIS WITH CONTRAST 10/29/2024 05:52:43 PM TECHNIQUE: CT of the abdomen and pelvis was performed with the administration of 75 mL of iohexol  (OMNIPAQUE ) 350 MG/ML injection. Multiplanar reformatted images are provided for review. Automated exposure control, iterative reconstruction, and/or weight-based adjustment of the mA/kV was utilized to reduce the radiation dose to as low as reasonably achievable. COMPARISON: 10/01/2019 CLINICAL HISTORY: Sepsis FINDINGS: LOWER CHEST: There is atelectasis in the lung bases. LIVER: The liver is unremarkable. GALLBLADDER AND BILE DUCTS: Gallbladder surgically absent. No biliary ductal dilatation. SPLEEN: No acute abnormality. PANCREAS: No acute abnormality. ADRENAL GLANDS: No acute abnormality. KIDNEYS, URETERS AND BLADDER: There is a punctate calculus in the inferior pole of the left kidney. There is mild nonspecific bilateral perinephric vascular stranding. There is some scarring in the lateral left kidney. No hydronephrosis. Urinary bladder is unremarkable. GI AND BOWEL: Stomach demonstrates no acute abnormality. There is diffuse colonic diverticulosis. The appendix appears normal. There is no bowel obstruction. PERITONEUM AND RETROPERITONEUM: No ascites. No free air. VASCULATURE: Aorta is normal in caliber. There are atherosclerotic calcifications of the aorta. LYMPH NODES: No lymphadenopathy. REPRODUCTIVE ORGANS: No acute abnormality. BONES AND SOFT TISSUES: Degenerative changes affect the spine. There is no focal osseous lesion. There is a very small fat containing left inguinal hernia seen best on coronal view. IMPRESSION: 1. No acute findings in the abdomen or pelvis. 2. Cholecystectomy without complication. 3. Diffuse colonic diverticulosis without evidence of acute diverticulitis.  4. Aortic atherosclerosis without aneurysm. 5. Nonobstructing punctate left inferior pole renal calculus. 6. Bilateral mild perinephric stranding and left renal cortical scarring, likely chronic. Electronically signed by: Greig Pique MD 10/29/2024 06:52 PM EST RP Workstation: [id number]     Procedures   Medications Ordered in the ED  acetaminophen  (TYLENOL ) tablet 650 mg (has no administration in time range)  cholecalciferol  (VITAMIN D3) 25 MCG (1000 UNIT) tablet 1,000 Units (has no administration in time range)  cyclobenzaprine  (FLEXERIL ) tablet 10 mg (has no administration in time range)  gabapentin  (NEURONTIN ) capsule 300 mg (has no administration in time range)  losartan -hydrochlorothiazide  (HYZAAR ) 50-12.5 MG per tablet 1 tablet (has no administration in time range)  multivitamin with minerals tablet 1 tablet (has no administration in time range)  celecoxib  (CELEBREX ) capsule 100 mg (has no administration in time range)  oxyCODONE  (Oxy IR/ROXICODONE ) immediate release tablet 5 mg (has no administration in time range)  simvastatin  (ZOCOR ) tablet 40 mg (has no administration in time range)  terbinafine  (LAMISIL ) tablet 250 mg (has no administration in time range)  lidocaine  (LIDODERM ) 5 % 1 patch (has no administration in time range)  morphine  (PF) 4 MG/ML injection 4 mg (4 mg Intravenous Given 10/29/24 1707)  LORazepam (ATIVAN) tablet 1 mg (1 mg Oral Given 10/29/24 1756)  acetaminophen  (TYLENOL ) tablet 650 mg (650 mg Oral Given 10/29/24 1736)  iohexol  (OMNIPAQUE ) 350 MG/ML injection 75 mL (75 mLs Intravenous Contrast Given 10/29/24 1756)  sodium chloride  0.9 % bolus 1,000 mL (0 mLs Intravenous Stopped 10/29/24 2112)  gadobutrol (GADAVIST) 1 MMOL/ML injection 9 mL (9 mLs Intravenous Contrast Given 10/29/24 1913)    Clinical Course as of 10/29/24 2238  Fri Oct 29, 2024  2201 Received a call from radiology - no fracture, AVN or hip effusion. MRI shows R gluteal and obturator strain.  With patient being unable to ambulate, will recommend PT eval. Unclear etiology of fever, but low suspicion for septic joint without effusion, erythema or warmth of the joint. Remainder of infectious work up negative.  [VK]    Clinical Course User Index [VK] Kingsley, Lorin Gawron K, DO                                 Medical Decision Making This patient presents to the ED with chief complaint(s) of R hip pain with pertinent past medical history of osteoarthritis, HTN which further complicates the presenting complaint. The complaint involves an extensive differential diagnosis and also carries with it a high risk of complications and morbidity.    The differential diagnosis includes muscle strain or spasm, fracture, dislocation, anemia, septic joint though less likely as no erythema or warmth  Additional history obtained: Additional history obtained from spouse Records reviewed Care Everywhere/External Records - ortho records  ED Course and Reassessment: On patient's arrival he was hemodynamically stable in no acute distress.  He had labs initiated in triage and MRI of his right hip ordered.  The patient's labs showed a mild leukocytosis and mild transaminitis.  The patient did feel warm to touch and concern for fever here.  Will have additional infectious workup with CTAP to evaluate for possible soft tissue infection or other intra-abdominal infection with abnormal LFTs, viral swab and urinalysis.  He was given pain control and will be closely reassessed.  Independent labs interpretation:  The following labs were independently interpreted: Leukocytosis, mild transaminitis otherwise within normal range  Independent visualization of imaging: - I independently  visualized the following imaging with scope of interpretation limited to determining acute life threatening conditions related to emergency care: CTAP, MRI right hip, which revealed no acute abnormality on CTAP, MRI right hip with right  gluteal strain, no bony abnormality  Consultation: - Consulted or discussed management/test interpretation w/ external professional: TOC/PT   Amount and/or Complexity of Data Reviewed Labs: ordered. Radiology: ordered.  Risk OTC drugs. Prescription drug management.       Final diagnoses:  Muscle strain of right gluteal region, initial encounter    ED Discharge Orders     None          Ellouise Richerd POUR, DO 10/29/24 2238

## 2024-10-29 NOTE — Progress Notes (Signed)
 CSW received stat consult for SNF Placement. CSW reviewed chart, met with patient and spouse, Consuelo [phone]), at bedside in hallway. They share that patient began to have significant hip pain last weekend that has worsened over the week, culminating in him waking up this morning and being able unable to get out of bed. In addition to pain limiting his functional capacity, Consuelo also fears that the decreased activity over the last week has resulted in a loss of strength. They share patient had a knee replacement scheduled for 12/15 but it has been cancelled, as his ability to rehab knee is currently limited.  We discussed that patient will likely be boarded in the ED to await PT/OT evaluation. We discussed PT/OT involvement in disposition recommendations. We discussed various LOC's, namely SNF and HH. Spouse Consuelo does state that she will advocate strongly for IPR/CIR LOC. Education provided on IPR/CIR criteria, CSW attempted to set expectations. They did not have any preferred SNF's, but state they will not go to [facility]. Medicare.gov SNF list offered but declined, state they prefer to go by word of mouth. Research encouraged.   TOC following. Will await PT/OT recommendations in the AM.

## 2024-10-29 NOTE — TOC CM/SW Note (Signed)
 TOC consult received for d/c planning needs, possible SNF placement. Per notes, patient is currently scheduled for a knee replacement next Monday (12/15). Follow-up to be completed with patient as appropriate.   Merilee Batty, MSN, RN Case Management [phone]

## 2024-10-29 NOTE — ED Triage Notes (Addendum)
 Pt BIB EMS from home with c/o right back hip and groin pain that started 2 weeks that has progressively increased. 10/10 now, denies injury/falls. States he was scheduled for MRI today at 6 PM but unable to make it.   122/52 96% room air  HR 74

## 2024-10-29 NOTE — ED Provider Triage Note (Signed)
 Emergency Medicine Provider Triage Evaluation Note  Duane Greene , a 73 y.o. male  was evaluated in triage.  Pt complains of having right groin pain, this is in the anterior inguinal region..  This patient has recently been seen a couple of days ago at the med center, he has had imaging of the hips which has been unremarkable other than having osteoarthritis of the right hip.  He states he has also had a CT scan although I do not have records of this.  He went to the orthopedist in [place] and they have ordered an MRI to be for performed later today but when the patient tried to get out of bed today he had such severe pain in the right groin that he was unable to stand and thus called the paramedics for transport  Review of Systems  Positive: Groin pain Negative: No numbness no weakness no tingling no abdominal pain or chest pain or back pain, no difficulty using the bathroom, no testicular or scrotal pain  Physical Exam  BP (!) 123/57 (BP Location: Right Arm)   Pulse 76   Resp 18   SpO2 97%  Gen:   Awake, no distress   Resp:  Normal effort  MSK:   Moves extremities without difficulty except for the right lower extremity, he is able to move all other extremities normally but when he tries to flex the right hip abduct or adduct the hips he has severe pain which limits it.  He has totally normal straight sensation to the leg and has totally normal pulses distally.  There is no reproducible tenderness with palpation of the inguinal region or the anterior thigh Genitourinary exam is totally normal with penoscrotal and testicles without any findings. Other:  No signs of zoster rash  Medical Decision Making  Medically screening exam initiated at 12:22 PM.  Appropriate orders placed.  Duane Greene was informed that the remainder of the evaluation will be completed by another provider, this initial triage assessment does not replace that evaluation, and the importance of remaining in the ED until  their evaluation is complete.  Unclear what is causing his pain, he has already been scheduled for an MRI, it is not clear exactly what he will need however I think at this time he would benefit from having advanced imaging of the pelvis   Cleotilde Rogue, MD 10/29/24 1224

## 2024-10-29 NOTE — ED Notes (Signed)
 Pt transported to MRI

## 2024-10-30 ENCOUNTER — Emergency Department (HOSPITAL_COMMUNITY)

## 2024-10-30 DIAGNOSIS — K5903 Drug induced constipation: Secondary | ICD-10-CM

## 2024-10-30 DIAGNOSIS — B9561 Methicillin susceptible Staphylococcus aureus infection as the cause of diseases classified elsewhere: Secondary | ICD-10-CM

## 2024-10-30 DIAGNOSIS — M009 Pyogenic arthritis, unspecified: Secondary | ICD-10-CM | POA: Diagnosis present

## 2024-10-30 DIAGNOSIS — Z7409 Other reduced mobility: Secondary | ICD-10-CM | POA: Diagnosis present

## 2024-10-30 DIAGNOSIS — E860 Dehydration: Secondary | ICD-10-CM | POA: Diagnosis not present

## 2024-10-30 DIAGNOSIS — M00051 Staphylococcal arthritis, right hip: Secondary | ICD-10-CM

## 2024-10-30 DIAGNOSIS — E871 Hypo-osmolality and hyponatremia: Secondary | ICD-10-CM | POA: Diagnosis not present

## 2024-10-30 DIAGNOSIS — R4182 Altered mental status, unspecified: Secondary | ICD-10-CM | POA: Diagnosis not present

## 2024-10-30 DIAGNOSIS — Z823 Family history of stroke: Secondary | ICD-10-CM | POA: Diagnosis not present

## 2024-10-30 DIAGNOSIS — I342 Nonrheumatic mitral (valve) stenosis: Secondary | ICD-10-CM | POA: Diagnosis not present

## 2024-10-30 DIAGNOSIS — I059 Rheumatic mitral valve disease, unspecified: Secondary | ICD-10-CM | POA: Diagnosis not present

## 2024-10-30 DIAGNOSIS — R7401 Elevation of levels of liver transaminase levels: Secondary | ICD-10-CM | POA: Diagnosis not present

## 2024-10-30 DIAGNOSIS — I330 Acute and subacute infective endocarditis: Secondary | ICD-10-CM | POA: Diagnosis not present

## 2024-10-30 DIAGNOSIS — Z96611 Presence of right artificial shoulder joint: Secondary | ICD-10-CM | POA: Diagnosis present

## 2024-10-30 DIAGNOSIS — M160 Bilateral primary osteoarthritis of hip: Secondary | ICD-10-CM | POA: Diagnosis present

## 2024-10-30 DIAGNOSIS — G4733 Obstructive sleep apnea (adult) (pediatric): Secondary | ICD-10-CM | POA: Diagnosis present

## 2024-10-30 DIAGNOSIS — G8918 Other acute postprocedural pain: Secondary | ICD-10-CM | POA: Diagnosis not present

## 2024-10-30 DIAGNOSIS — R350 Frequency of micturition: Secondary | ICD-10-CM | POA: Diagnosis not present

## 2024-10-30 DIAGNOSIS — I10 Essential (primary) hypertension: Secondary | ICD-10-CM | POA: Diagnosis present

## 2024-10-30 DIAGNOSIS — M0000 Staphylococcal arthritis, unspecified joint: Secondary | ICD-10-CM | POA: Diagnosis not present

## 2024-10-30 DIAGNOSIS — Z792 Long term (current) use of antibiotics: Secondary | ICD-10-CM | POA: Diagnosis not present

## 2024-10-30 DIAGNOSIS — Z66 Do not resuscitate: Secondary | ICD-10-CM | POA: Diagnosis present

## 2024-10-30 DIAGNOSIS — Z1152 Encounter for screening for COVID-19: Secondary | ICD-10-CM | POA: Diagnosis not present

## 2024-10-30 DIAGNOSIS — M609 Myositis, unspecified: Secondary | ICD-10-CM | POA: Diagnosis present

## 2024-10-30 DIAGNOSIS — M1711 Unilateral primary osteoarthritis, right knee: Secondary | ICD-10-CM | POA: Diagnosis not present

## 2024-10-30 DIAGNOSIS — I340 Nonrheumatic mitral (valve) insufficiency: Secondary | ICD-10-CM | POA: Diagnosis not present

## 2024-10-30 DIAGNOSIS — Z79899 Other long term (current) drug therapy: Secondary | ICD-10-CM | POA: Diagnosis not present

## 2024-10-30 DIAGNOSIS — G4730 Sleep apnea, unspecified: Secondary | ICD-10-CM | POA: Diagnosis not present

## 2024-10-30 DIAGNOSIS — R5381 Other malaise: Secondary | ICD-10-CM | POA: Diagnosis not present

## 2024-10-30 DIAGNOSIS — K759 Inflammatory liver disease, unspecified: Secondary | ICD-10-CM | POA: Diagnosis present

## 2024-10-30 DIAGNOSIS — E785 Hyperlipidemia, unspecified: Secondary | ICD-10-CM | POA: Diagnosis present

## 2024-10-30 DIAGNOSIS — R7881 Bacteremia: Secondary | ICD-10-CM | POA: Diagnosis not present

## 2024-10-30 DIAGNOSIS — K5900 Constipation, unspecified: Secondary | ICD-10-CM | POA: Diagnosis present

## 2024-10-30 DIAGNOSIS — R195 Other fecal abnormalities: Secondary | ICD-10-CM | POA: Diagnosis not present

## 2024-10-30 LAB — DIFFERENTIAL
Abs Immature Granulocytes: 0.04 K/uL (ref 0.00–0.07)
Basophils Absolute: 0 K/uL (ref 0.0–0.1)
Basophils Relative: 0 %
Eosinophils Absolute: 0 K/uL (ref 0.0–0.5)
Eosinophils Relative: 0 %
Immature Granulocytes: 0 %
Lymphocytes Relative: 7 %
Lymphs Abs: 0.8 K/uL (ref 0.7–4.0)
Monocytes Absolute: 1.8 K/uL — ABNORMAL HIGH (ref 0.1–1.0)
Monocytes Relative: 15 %
Neutro Abs: 9.6 K/uL — ABNORMAL HIGH (ref 1.7–7.7)
Neutrophils Relative %: 78 %

## 2024-10-30 LAB — COMPREHENSIVE METABOLIC PANEL WITH GFR
ALT: 50 U/L — ABNORMAL HIGH (ref 0–44)
AST: 51 U/L — ABNORMAL HIGH (ref 15–41)
Albumin: 2.5 g/dL — ABNORMAL LOW (ref 3.5–5.0)
Alkaline Phosphatase: 103 U/L (ref 38–126)
Anion gap: 8 (ref 5–15)
BUN: 22 mg/dL (ref 8–23)
CO2: 26 mmol/L (ref 22–32)
Calcium: 7.9 mg/dL — ABNORMAL LOW (ref 8.9–10.3)
Chloride: 100 mmol/L (ref 98–111)
Creatinine, Ser: 0.87 mg/dL (ref 0.61–1.24)
GFR, Estimated: >60 mL/min (ref >60)
Glucose, Bld: 165 mg/dL — ABNORMAL HIGH (ref 70–99)
Potassium: 3.6 mmol/L (ref 3.5–5.1)
Sodium: 134 mmol/L — ABNORMAL LOW (ref 135–145)
Total Bilirubin: 1.2 mg/dL (ref 0.0–1.2)
Total Protein: 5.8 g/dL — ABNORMAL LOW (ref 6.5–8.1)

## 2024-10-30 LAB — C-REACTIVE PROTEIN: CRP: 27.6 mg/dL — ABNORMAL HIGH (ref <1.0)

## 2024-10-30 LAB — BLOOD CULTURE ID PANEL (REFLEXED) - BCID2

## 2024-10-30 LAB — URINALYSIS, ROUTINE W REFLEX MICROSCOPIC
Bilirubin Urine: NEGATIVE
Glucose, UA: NEGATIVE mg/dL
Ketones, ur: NEGATIVE mg/dL
Leukocytes,Ua: NEGATIVE
Nitrite: NEGATIVE
Protein, ur: 100 mg/dL — AB
Specific Gravity, Urine: 1.031 — ABNORMAL HIGH (ref 1.005–1.030)
pH: 5 (ref 5.0–8.0)

## 2024-10-30 LAB — SYNOVIAL CELL COUNT + DIFF, W/ CRYSTALS
Crystals, Fluid: NONE SEEN
Eosinophils-Synovial: 0 % (ref 0–1)
Lymphocytes-Synovial Fld: 7 % (ref 0–20)
Monocyte-Macrophage-Synovial Fluid: 9 % — ABNORMAL LOW (ref 50–90)
Neutrophil, Synovial: 84 % — ABNORMAL HIGH (ref 0–25)
WBC, Synovial: 111000 /mm3 — ABNORMAL HIGH (ref 0–200)

## 2024-10-30 LAB — CBC
HCT: 40.5 % (ref 39.0–52.0)
Hemoglobin: 14.4 g/dL (ref 13.0–17.0)
MCH: 33 pg (ref 26.0–34.0)
MCHC: 35.6 g/dL (ref 30.0–36.0)
MCV: 92.9 fL (ref 80.0–100.0)
Platelets: 106 K/uL — ABNORMAL LOW (ref 150–400)
RBC: 4.36 MIL/uL (ref 4.22–5.81)
RDW: 13 % (ref 11.5–15.5)
WBC: 12.3 K/uL — ABNORMAL HIGH (ref 4.0–10.5)
nRBC: 0 % (ref 0.0–0.2)

## 2024-10-30 LAB — CREATININE, SERUM
Creatinine, Ser: 0.88 mg/dL (ref 0.61–1.24)
GFR, Estimated: >60 mL/min (ref >60)

## 2024-10-30 LAB — SEDIMENTATION RATE: Sed Rate: 32 mm/h — ABNORMAL HIGH (ref 0–16)

## 2024-10-30 LAB — CK: Total CK: 148 U/L (ref 49–397)

## 2024-10-30 MED ORDER — LOSARTAN POTASSIUM 50 MG PO TABS
50.0000 mg | ORAL_TABLET | Freq: Every day | ORAL | Status: DC
  Administered 2024-10-30 – 2024-11-04 (×5): 50 mg via ORAL
  Filled 2024-10-30 (×5): qty 1

## 2024-10-30 MED ORDER — POLYETHYLENE GLYCOL 3350 17 G PO PACK
17.0000 g | PACK | Freq: Every day | ORAL | Status: DC
  Administered 2024-10-30: 17 g via ORAL
  Filled 2024-10-30: qty 1

## 2024-10-30 MED ORDER — METHOCARBAMOL 500 MG PO TABS
500.0000 mg | ORAL_TABLET | Freq: Three times a day (TID) | ORAL | Status: DC
  Administered 2024-10-30 – 2024-11-04 (×14): 500 mg via ORAL
  Filled 2024-10-30 (×14): qty 1

## 2024-10-30 MED ORDER — TERBINAFINE HCL 250 MG PO TABS
250.0000 mg | ORAL_TABLET | ORAL | Status: DC
  Administered 2024-11-01: 09:00:00 250 mg via ORAL
  Filled 2024-10-30: qty 1

## 2024-10-30 MED ORDER — HYDROCHLOROTHIAZIDE 12.5 MG PO TABS
12.5000 mg | ORAL_TABLET | Freq: Every day | ORAL | Status: DC
  Administered 2024-10-30 – 2024-11-04 (×5): 12.5 mg via ORAL
  Filled 2024-10-30 (×5): qty 1

## 2024-10-30 MED ORDER — LOSARTAN POTASSIUM-HCTZ 50-12.5 MG PO TABS: 1.0000 | ORAL_TABLET | Freq: Every day | ORAL | Status: DC

## 2024-10-30 MED ORDER — IOHEXOL 180 MG/ML  SOLN
20.0000 mL | Freq: Once | INTRAMUSCULAR | Status: AC | PRN
  Administered 2024-10-30: 5 mL via INTRA_ARTICULAR

## 2024-10-30 MED ORDER — LIDOCAINE HCL (PF) 1 % IJ SOLN
5.0000 mL | Freq: Once | INTRAMUSCULAR | Status: AC
  Administered 2024-10-30: 4 mL via INTRADERMAL

## 2024-10-30 MED ORDER — POLYETHYLENE GLYCOL 3350 17 G PO PACK
17.0000 g | PACK | Freq: Two times a day (BID) | ORAL | Status: DC
  Administered 2024-10-30 – 2024-11-01 (×5): 17 g via ORAL
  Filled 2024-10-30 (×7): qty 1

## 2024-10-30 MED ORDER — DOCUSATE SODIUM 100 MG PO CAPS
100.0000 mg | ORAL_CAPSULE | Freq: Two times a day (BID) | ORAL | Status: DC
  Administered 2024-10-30: 100 mg via ORAL
  Filled 2024-10-30: qty 1

## 2024-10-30 MED ORDER — OXYCODONE HCL 5 MG PO TABS
5.0000 mg | ORAL_TABLET | ORAL | Status: DC | PRN
  Administered 2024-10-31 – 2024-11-04 (×8): 5 mg via ORAL
  Filled 2024-10-30 (×8): qty 1

## 2024-10-30 MED ORDER — MORPHINE SULFATE (PF) 4 MG/ML IV SOLN
4.0000 mg | Freq: Once | INTRAVENOUS | Status: AC
  Administered 2024-10-30: 4 mg via INTRAVENOUS
  Filled 2024-10-30: qty 1

## 2024-10-30 MED ORDER — GABAPENTIN 300 MG PO CAPS
300.0000 mg | ORAL_CAPSULE | Freq: Three times a day (TID) | ORAL | Status: DC
  Administered 2024-10-30 – 2024-11-04 (×14): 300 mg via ORAL
  Filled 2024-10-30 (×14): qty 1

## 2024-10-30 MED ORDER — CEFAZOLIN SODIUM-DEXTROSE 2-4 GM/100ML-% IV SOLN
2.0000 g | Freq: Three times a day (TID) | INTRAVENOUS | Status: DC
  Administered 2024-10-30 – 2024-11-04 (×15): 2 g via INTRAVENOUS
  Filled 2024-10-30 (×14): qty 100

## 2024-10-30 MED ORDER — VANCOMYCIN HCL IN DEXTROSE 1-5 GM/200ML-% IV SOLN: 1000.0000 mg | Freq: Two times a day (BID) | INTRAVENOUS | Status: DC

## 2024-10-30 MED ORDER — ACETAMINOPHEN 500 MG PO TABS
1000.0000 mg | ORAL_TABLET | Freq: Three times a day (TID) | ORAL | Status: DC
  Administered 2024-10-30 – 2024-11-04 (×14): 1000 mg via ORAL
  Filled 2024-10-30 (×14): qty 2

## 2024-10-30 MED ORDER — VANCOMYCIN HCL 1750 MG/350ML IV SOLN
1750.0000 mg | Freq: Once | INTRAVENOUS | Status: DC
  Filled 2024-10-30: qty 350

## 2024-10-30 MED ORDER — SENNOSIDES-DOCUSATE SODIUM 8.6-50 MG PO TABS
2.0000 | ORAL_TABLET | Freq: Two times a day (BID) | ORAL | Status: DC
  Administered 2024-10-30 – 2024-11-01 (×6): 2 via ORAL
  Filled 2024-10-30 (×7): qty 2

## 2024-10-30 MED ORDER — ENOXAPARIN SODIUM 40 MG/0.4ML IJ SOSY
40.0000 mg | PREFILLED_SYRINGE | INTRAMUSCULAR | Status: DC
  Administered 2024-10-30 – 2024-11-04 (×4): 40 mg via SUBCUTANEOUS
  Filled 2024-10-30 (×5): qty 0.4

## 2024-10-30 NOTE — Progress Notes (Signed)
 PHARMACY - PHYSICIAN COMMUNICATION CRITICAL VALUE ALERT - BLOOD CULTURE IDENTIFICATION (BCID)  Duane Greene is an 73 y.o. male who presented to [facility] on 10/29/2024 with a chief complaint of hip pain  Assessment:  3/4 BCX staph aureus (no resistance)  Name of physician (or Provider) Contacted: Eben + Georgina  Current antibiotics: vancomycin  x1  Changes to prescribed antibiotics recommended:  Cefazolin  2 g IV q 8h for MSSA coverage  Results for orders placed or performed during the hospital encounter of 10/29/24  Blood Culture ID Panel (Reflexed) (Collected: 10/29/2024  5:09 PM)  Result Value Ref Range   Enterococcus faecalis NOT DETECTED NOT DETECTED   Enterococcus Faecium NOT DETECTED NOT DETECTED   Listeria monocytogenes NOT DETECTED NOT DETECTED   Staphylococcus species DETECTED (A) NOT DETECTED   Staphylococcus aureus (BCID) DETECTED (A) NOT DETECTED   Staphylococcus epidermidis NOT DETECTED NOT DETECTED   Staphylococcus lugdunensis NOT DETECTED NOT DETECTED   Streptococcus species NOT DETECTED NOT DETECTED   Streptococcus agalactiae NOT DETECTED NOT DETECTED   Streptococcus pneumoniae NOT DETECTED NOT DETECTED   Streptococcus pyogenes NOT DETECTED NOT DETECTED   A.calcoaceticus-baumannii NOT DETECTED NOT DETECTED   Bacteroides fragilis NOT DETECTED NOT DETECTED   Enterobacterales NOT DETECTED NOT DETECTED   Enterobacter cloacae complex NOT DETECTED NOT DETECTED   Escherichia coli NOT DETECTED NOT DETECTED   Klebsiella aerogenes NOT DETECTED NOT DETECTED   Klebsiella oxytoca NOT DETECTED NOT DETECTED   Klebsiella pneumoniae NOT DETECTED NOT DETECTED   Proteus species NOT DETECTED NOT DETECTED   Salmonella species NOT DETECTED NOT DETECTED   Serratia marcescens NOT DETECTED NOT DETECTED   Haemophilus influenzae NOT DETECTED NOT DETECTED   Neisseria meningitidis NOT DETECTED NOT DETECTED   Pseudomonas aeruginosa NOT DETECTED NOT DETECTED   Stenotrophomonas  maltophilia NOT DETECTED NOT DETECTED   Candida albicans NOT DETECTED NOT DETECTED   Candida auris NOT DETECTED NOT DETECTED   Candida glabrata NOT DETECTED NOT DETECTED   Candida krusei NOT DETECTED NOT DETECTED   Candida parapsilosis NOT DETECTED NOT DETECTED   Candida tropicalis NOT DETECTED NOT DETECTED   Cryptococcus neoformans/gattii NOT DETECTED NOT DETECTED   Meth resistant mecA/C and MREJ NOT DETECTED NOT DETECTED    Sharyne LILLETTE Glatter 10/30/2024  9:56 AM

## 2024-10-30 NOTE — ED Notes (Signed)
 Hospital bed requested by patient/family. Secretary made away and placed order.

## 2024-10-30 NOTE — ED Notes (Signed)
 Secretary followed up on status of hospital bed delivery. ETA unknown at this time. Pt/family made aware.

## 2024-10-30 NOTE — Plan of Care (Signed)

## 2024-10-30 NOTE — Evaluation (Signed)
 Physical Therapy Evaluation Patient Details Name: Idus L Leary MRN: [id number] DOB: Aug 27, 1951 Today's Date: 10/30/2024  History of Present Illness  Nyeem L Sun is a 73 y.o. male who p/w R hip joint swelling and imaging/arthrocentesis c/f R hip septic arthritis. Past medical history significant of HTN, HLD, OSA (on CPAP), and OA b/l hips s/p US  guided steroid injection on 12/2.  Clinical Impression  Pt presents with admitting diagnosis above. Pt very limited by intense R hip pain requiring Max/Total A for all mobility. PTA pt was fully independent until 1 week ago when R hip pain became unbearable limiting his mobility. Anticipate that pt will be able to move fairly well once pain is better controlled. Patient will benefit from continued inpatient follow up therapy, <3 hours/day. PT will continue to follow.          If plan is discharge home, recommend the following: Two people to help with walking and/or transfers;A lot of help with bathing/dressing/bathroom;Assist for transportation;Help with stairs or ramp for entrance   Can travel by private vehicle   No    Equipment Recommendations Hospital bed;Hoyer lift;Wheelchair (measurements PT);Wheelchair cushion (measurements PT)  Recommendations for Other Services  Rehab consult (Family requesting)    Functional Status Assessment Patient has had a recent decline in their functional status and demonstrates the ability to make significant improvements in function in a reasonable and predictable amount of time.     Precautions / Restrictions Precautions Precautions: Fall Recall of Precautions/Restrictions: Intact Restrictions Weight Bearing Restrictions Per Provider Order: No      Mobility  Bed Mobility Overal bed mobility: Needs Assistance Bed Mobility: Supine to Sit, Sit to Supine     Supine to sit: Total assist Sit to supine: Total assist   General bed mobility comments: Total A due to pain    Transfers Overall  transfer level: Needs assistance Equipment used: Rolling walker (2 wheels) Transfers: Sit to/from Stand Sit to Stand: Max assist           General transfer comment: Max A to stand. Pt unable to come fully upright due to pain.    Ambulation/Gait               General Gait Details: Unable due to pain  Stairs            Wheelchair Mobility     Tilt Bed    Modified Rankin (Stroke Patients Only)       Balance Overall balance assessment: Needs assistance Sitting-balance support: Bilateral upper extremity supported, Feet supported Sitting balance-Leahy Scale: Fair     Standing balance support: Bilateral upper extremity supported, During functional activity Standing balance-Leahy Scale: Poor Standing balance comment: Reliant on RW                             Pertinent Vitals/Pain Pain Assessment Pain Assessment: 0-10 Pain Score: 10-Worst pain ever Pain Location: R hip Pain Descriptors / Indicators: Sore, Sharp, Stabbing, Constant, Grimacing, Discomfort Pain Intervention(s): Monitored during session, Premedicated before session, Limited activity within patient's tolerance    Home Living Family/patient expects to be discharged to:: Private residence Living Arrangements: Spouse/significant other Available Help at Discharge: Family;Available 24 hours/day Type of Home: House Home Access: Stairs to enter Entrance Stairs-Rails: None Entrance Stairs-Number of Steps: 3   Home Layout: One level Home Equipment: [employer] (2 wheels);Cane - single point;Shower seat - built in;Hand held shower head      Prior  Function Prior Level of Function : Independent/Modified Independent;Needs assist;Driving             Mobility Comments: Prior to last week, pt was ind with no AD however since then has been barely mobile with RW due to hip pain. ADLs Comments: Ind prior to last week     Extremity/Trunk Assessment   Upper Extremity Assessment Upper  Extremity Assessment: Overall WFL for tasks assessed    Lower Extremity Assessment Lower Extremity Assessment: RLE deficits/detail RLE: Unable to fully assess due to pain    Cervical / Trunk Assessment Cervical / Trunk Assessment: Normal  Communication   Communication Communication: No apparent difficulties    Cognition Arousal: Alert Behavior During Therapy: WFL for tasks assessed/performed   PT - Cognitive impairments: No apparent impairments                         Following commands: Intact       Cueing Cueing Techniques: Verbal cues, Tactile cues     General Comments General comments (skin integrity, edema, etc.): VSS    Exercises     Assessment/Plan    PT Assessment Patient needs continued PT services  PT Problem List Decreased strength;Decreased range of motion;Decreased balance;Decreased activity tolerance;Decreased mobility;Decreased coordination;Decreased safety awareness;Decreased knowledge of use of DME;Decreased knowledge of precautions;Cardiopulmonary status limiting activity;Pain       PT Treatment Interventions DME instruction;Gait training;Stair training;Functional mobility training;Therapeutic activities;Balance training;Therapeutic exercise;Neuromuscular re-education;Patient/family education;Cognitive remediation;Wheelchair mobility training    PT Goals (Current goals can be found in the Care Plan section)       Frequency Min 2X/week     Co-evaluation               AM-PAC PT 6 Clicks Mobility  Outcome Measure Help needed turning from your back to your side while in a flat bed without using bedrails?: Total Help needed moving from lying on your back to sitting on the side of a flat bed without using bedrails?: Total Help needed moving to and from a bed to a chair (including a wheelchair)?: Total Help needed standing up from a chair using your arms (e.g., wheelchair or bedside chair)?: A Lot Help needed to walk in hospital  room?: Total Help needed climbing 3-5 steps with a railing? : Total 6 Click Score: 7    End of Session Equipment Utilized During Treatment: Gait belt Activity Tolerance: Patient tolerated treatment well Patient left: in bed;with call bell/phone within reach;with family/visitor present Nurse Communication: Mobility status PT Visit Diagnosis: Other abnormalities of gait and mobility (R26.89)    Time: 8976-8942 PT Time Calculation (min) (ACUTE ONLY): 34 min   Charges:   PT Evaluation $PT Eval Moderate Complexity: 1 Mod PT Treatments $Therapeutic Activity: 8-22 mins PT General Charges $$ ACUTE PT VISIT: 1 Visit         Sueellen NOVAK, PT, DPT Acute Rehab Services [phone]   Fortino Haag 10/30/2024, 3:27 PM

## 2024-10-30 NOTE — Consult Note (Addendum)
 [facility]    Date of Admission:  10/29/2024   Total days of antibiotics: 0 vanco               Reason for Consult: Septic arthritis    Referring Provider: Freddi   Assessment: Septic Arthritis Myositis Dehydration Constipation Hepatitis, mild Decubitus R hip (per wife)  Plan: Start vanco Ortho to see Repeat BCx in am Needs TTE/TEE hydration Bowel hygiene F/u LFTs WOC eval (stage 1?)  Thank you so much for this interesting consult,  Active Problems:   * No active hospital problems. *    cholecalciferol   1,000 Units Oral Daily   docusate sodium   100 mg Oral BID   gabapentin   300 mg Oral QHS   losartan   50 mg Oral Daily   And   hydrochlorothiazide   12.5 mg Oral Daily   lidocaine   1 patch Transdermal Q24H   multivitamin with minerals  1 tablet Oral QHS   polyethylene glycol  17 g Oral Daily   simvastatin   40 mg Oral QPM   [START ON 11/01/2024] terbinafine   250 mg Oral Q Mon    HPI: Duane Greene is a 73 y.o. 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  Recent steroid injection into his bilateral hips on 12-2 to help with his R knee pain.  Comes into the hospital with worsening hip pain, he has been unable to walk and has needed to use a cane. He was seen in ortho on 12-11 (work up for R TKR 12-15) and was planned for MRI of his hip. He was unable to get out of bed and ortho sent him to ED 12-12. He denies fever but in ED has  had temp up to 100.4. He is now found to have 3/4 BCx+ GPC.  WBC is 12.9 and MRI shows septic arthritis and myositis.    Review of Systems: Review of Systems  Constitutional:  Negative for chills and fever.  Gastrointestinal:  Positive for constipation. Negative for abdominal pain and diarrhea.  Genitourinary:  Negative for dysuria, frequency and urgency.       Incontenence  Musculoskeletal:  Positive  for joint pain and myalgias.    Past Medical History:  Diagnosis Date   Arthritis    History of kidney stones    Hyperlipidemia    Hypertension    Right inguinal hernia    Sleep apnea    uses CPAP nightly    Social History[1]  Family History  Problem Relation Age of Onset   Heart Problems Father    CVA Father      Medications: I have reviewed the patient's current medications.  Abtx:  Anti-infectives (From admission, onward)    Start     Dose/Rate Route Frequency Ordered Stop   11/01/24 1000  terbinafine  (LAMISIL ) tablet 250 mg        250 mg Oral Every Mon 10/30/24 0811     10/29/24 2245  terbinafine  (LAMISIL ) tablet 250 mg  Status:  Discontinued        250 mg Oral Every 7 days 10/29/24 2235 10/30/24 0810         OBJECTIVE: Blood pressure (!) 115/49, pulse 75, temperature 98.7 F (37.1 C), temperature source Oral, resp. rate 20, SpO2 96%.  Physical Exam Vitals reviewed.  Constitutional:  General: He is not in acute distress.    Appearance: Normal appearance. He is not ill-appearing, toxic-appearing or diaphoretic.  HENT:     Mouth/Throat:     Mouth: Mucous membranes are dry.     Pharynx: No oropharyngeal exudate.  Eyes:     Extraocular Movements: Extraocular movements intact.     Pupils: Pupils are equal, round, and reactive to light.  Cardiovascular:     Rate and Rhythm: Normal rate and regular rhythm.     Heart sounds: No murmur heard. Pulmonary:     Effort: Pulmonary effort is normal.     Breath sounds: Normal breath sounds.  Abdominal:     General: Bowel sounds are normal. There is no distension.     Palpations: Abdomen is soft.     Tenderness: There is no abdominal tenderness.  Musculoskeletal:        General: Tenderness (R hip. no warmth. refuses rotation (too painful)) present.     Cervical back: Normal range of motion and neck supple.     Right lower leg: No edema.     Left lower leg: No edema.  Lymphadenopathy:     Cervical: No  cervical adenopathy.  Neurological:     General: No focal deficit present.  Psychiatric:        Mood and Affect: Mood normal.     Lab Results Results for orders placed or performed during the hospital encounter of 10/29/24 (from the past 48 hours)  CBC with Differential     Status: Abnormal   Collection Time: 10/29/24 12:25 PM  Result Value Ref Range   WBC 12.9 (H) 4.0 - 10.5 K/uL   RBC 4.71 4.22 - 5.81 MIL/uL   Hemoglobin 15.3 13.0 - 17.0 g/dL   HCT 55.5 60.9 - 47.9 %   MCV 94.3 80.0 - 100.0 fL   MCH 32.5 26.0 - 34.0 pg   MCHC 34.5 30.0 - 36.0 g/dL   RDW 86.8 88.4 - 84.4 %   Platelets 124 (L) 150 - 400 K/uL   nRBC 0.0 0.0 - 0.2 %   Neutrophils Relative % 77 %   Neutro Abs 10.0 (H) 1.7 - 7.7 K/uL   Lymphocytes Relative 7 %   Lymphs Abs 0.9 0.7 - 4.0 K/uL   Monocytes Relative 15 %   Monocytes Absolute 1.9 (H) 0.1 - 1.0 K/uL   Eosinophils Relative 0 %   Eosinophils Absolute 0.0 0.0 - 0.5 K/uL   Basophils Relative 0 %   Basophils Absolute 0.0 0.0 - 0.1 K/uL   Immature Granulocytes 1 %   Abs Immature Granulocytes 0.06 0.00 - 0.07 K/uL    Comment: Performed at [facility], [address], [place], [place] [address]  Comprehensive metabolic panel     Status: Abnormal   Collection Time: 10/29/24 12:25 PM  Result Value Ref Range   Sodium 132 (L) 135 - 145 mmol/L   Potassium 3.5 3.5 - 5.1 mmol/L   Chloride 93 (L) 98 - 111 mmol/L   CO2 27 22 - 32 mmol/L   Glucose, Bld 114 (H) 70 - 99 mg/dL    Comment: Glucose reference range applies only to samples taken after fasting for at least 8 hours.   BUN 20 8 - 23 mg/dL   Creatinine, Ser 8.98 0.61 - 1.24 mg/dL   Calcium 8.3 (L) 8.9 - 10.3 mg/dL   Total Protein 7.1 6.5 - 8.1 g/dL   Albumin 3.1 (L) 3.5 - 5.0 g/dL   AST  48 (H) 15 - 41 U/L   ALT 55 (H) 0 - 44 U/L   Alkaline Phosphatase 98 38 - 126 U/L   Total Bilirubin 1.8 (H) 0.0 - 1.2 mg/dL   GFR, Estimated >39 >39 mL/min    Comment: (NOTE) Calculated using the CKD-EPI  Creatinine Equation (2021)    Anion gap 12 5 - 15    Comment: Performed at [facility], [address] [place], [place] [address]  Protime-INR     Status: Abnormal   Collection Time: 10/29/24  5:09 PM  Result Value Ref Range   Prothrombin Time 16.0 (H) 11.4 - 15.2 seconds   INR 1.2 0.8 - 1.2    Comment: (NOTE) INR goal varies based on device and disease states. Performed at [facility], [address], [place], [place] [address]   Blood Culture (routine x 2)     Status: None (Preliminary result)   Collection Time: 10/29/24  5:09 PM   Specimen: BLOOD  Result Value Ref Range   Specimen Description BLOOD BLOOD LEFT ARM    Special Requests      BOTTLES DRAWN AEROBIC AND ANAEROBIC Blood Culture adequate volume   Culture  Setup Time      GRAM POSITIVE COCCI ANAEROBIC BOTTLE ONLY Organism ID to follow Performed at [facility], [address], [place], [place] [address]    Culture GRAM POSITIVE COCCI    Report Status PENDING   I-Stat Lactic Acid, ED     Status: Abnormal   Collection Time: 10/29/24  5:32 PM  Result Value Ref Range   Lactic Acid, Venous 2.0 (HH) 0.5 - 1.9 mmol/L   Comment NOTIFIED PHYSICIAN   Resp panel by RT-PCR (RSV, Flu A&B, Covid) Anterior Nasal Swab     Status: None   Collection Time: 10/29/24  5:33 PM   Specimen: Anterior Nasal Swab  Result Value Ref Range   SARS Coronavirus 2 by RT PCR NEGATIVE NEGATIVE   Influenza A by PCR NEGATIVE NEGATIVE   Influenza B by PCR NEGATIVE NEGATIVE    Comment: (NOTE) The Xpert Xpress SARS-CoV-2/FLU/RSV plus assay is intended as an aid in the diagnosis of influenza from Nasopharyngeal swab specimens and should not be used as a sole basis for treatment. Nasal washings and aspirates are unacceptable for Xpert Xpress SARS-CoV-2/FLU/RSV testing.  Fact Sheet for Patients: bloggercourse.com  Fact Sheet for Healthcare Providers: seriousbroker.it  This test  is not yet approved or cleared by the [place]  FDA and has been authorized for detection and/or diagnosis of SARS-CoV-2 by FDA under an Emergency Use Authorization (EUA). This EUA will remain in effect (meaning this test can be used) for the duration of the COVID-19 declaration under Section 564(b)(1) of the Act, 21 U.S.C. section 360bbb-3(b)(1), unless the authorization is terminated or revoked.     Resp Syncytial Virus by PCR NEGATIVE NEGATIVE    Comment: (NOTE) Fact Sheet for Patients: bloggercourse.com  Fact Sheet for Healthcare Providers: seriousbroker.it  This test is not yet approved or cleared by the [place]  FDA and has been authorized for detection and/or diagnosis of SARS-CoV-2 by FDA under an Emergency Use Authorization (EUA). This EUA will remain in effect (meaning this test can be used) for the duration of the COVID-19 declaration under Section 564(b)(1) of the Act, 21 U.S.C. section 360bbb-3(b)(1), unless the authorization is terminated or revoked.  Performed at [facility], [address], [place], [place] [address]   I-Stat Lactic Acid,  ED     Status: None   Collection Time: 10/29/24  7:48 PM  Result Value Ref Range   Lactic Acid, Venous 1.1 0.5 - 1.9 mmol/L  Urinalysis, w/ Reflex to Culture (Infection Suspected) -Urine, Clean Catch     Status: Abnormal   Collection Time: 10/29/24  9:07 PM  Result Value Ref Range   Specimen Source URINE, CLEAN CATCH    Color, Urine AMBER (A) YELLOW    Comment: BIOCHEMICALS MAY BE AFFECTED BY COLOR   APPearance CLEAR CLEAR   Specific Gravity, Urine >1.060 (H) 1.005 - 1.030   pH 5.0 5.0 - 8.0   Glucose, UA NEGATIVE NEGATIVE mg/dL   Hgb urine dipstick SMALL (A) NEGATIVE   Bilirubin Urine NEGATIVE NEGATIVE   Ketones, ur 5 (A) NEGATIVE mg/dL   Protein, ur 30 (A) NEGATIVE mg/dL   Nitrite NEGATIVE NEGATIVE   Leukocytes,Ua NEGATIVE NEGATIVE   RBC / HPF 0-5 0 - 5  RBC/hpf   WBC, UA 0-5 0 - 5 WBC/hpf    Comment:        Reflex urine culture not performed if WBC <=10, OR if Squamous epithelial cells >5. If Squamous epithelial cells >5 suggest recollection.    Bacteria, UA RARE (A) NONE SEEN   Squamous Epithelial / HPF 0-5 0 - 5 /HPF   Mucus PRESENT     Comment: Performed at [facility], [address] [place], [place] [address]      Component Value Date/Time   SDES BLOOD BLOOD LEFT ARM 10/29/2024 1709   SPECREQUEST  10/29/2024 1709    BOTTLES DRAWN AEROBIC AND ANAEROBIC Blood Culture adequate volume   CULT GRAM POSITIVE COCCI 10/29/2024 1709   REPTSTATUS PENDING 10/29/2024 1709   MR HIP RIGHT W WO CONTRAST Result Date: 10/30/2024 CLINICAL DATA:  Chronic right hip pain and groin pain EXAM: MRI OF THE RIGHT HIP WITHOUT AND WITH CONTRAST TECHNIQUE: Multiplanar, multisequence MR imaging was performed both before and after administration of intravenous contrast. CONTRAST:  9mL GADAVIST  GADOBUTROL  1 MMOL/ML IV SOLN COMPARISON:  Hip x-ray 10/26/2024 FINDINGS: Bones: No hip fracture, dislocation or avascular necrosis. No periosteal reaction or bone destruction. No aggressive osseous lesion. Normal sacrum and sacroiliac joints. No SI joint widening or erosive changes. Lower lumbar spine spondylosis. Articular cartilage and labrum Articular cartilage: High-grade partial is cartilage loss of the right anterosuperior femoral head and acetabulum. Labrum:  Nondisplaced right anterosuperior labral tear. Joint or bursal effusion Joint effusion: Large right hip joint effusion. No left hip joint effusion. No SI joint effusion. Bursae:  No bursal fluid. Muscles and tendons Flexors: Normal. Extensors: Normal. Abductors: Normal. Adductors: Normal. Gluteals: Severe muscle edema in the right gluteus minimus muscle. Mild muscle edema in the abductor musculature and perifascial edema. Small fluid collection in the right gluteus minimus muscle measuring 5.9 x 0.9 x 3.5 cm.  Hamstrings: Normal. Other findings No pelvic free fluid. No fluid collection or hematoma. No inguinal lymphadenopathy. No inguinal hernia. IMPRESSION: 1. Large right hip joint effusion with severe muscle edema in the right gluteus minimus muscle and mild muscle edema in the abductor musculature and perifascial edema. Small abscess in the right gluteus minimus muscle measuring 5.9 x 0.9 x 3.5 cm. Findings most concerning for septic arthritis and myositis. 2. High-grade partial is cartilage loss of the right anterosuperior femoral head and acetabulum. 3. Nondisplaced right anterosuperior labral tear. Electronically Signed   By: Julaine Blanch M.D.   On: 10/30/2024 08:15   CT ABDOMEN PELVIS  W CONTRAST Result Date: 10/29/2024 EXAM: CT ABDOMEN AND PELVIS WITH CONTRAST 10/29/2024 05:52:43 PM TECHNIQUE: CT of the abdomen and pelvis was performed with the administration of 75 mL of iohexol  (OMNIPAQUE ) 350 MG/ML injection. Multiplanar reformatted images are provided for review. Automated exposure control, iterative reconstruction, and/or weight-based adjustment of the mA/kV was utilized to reduce the radiation dose to as low as reasonably achievable. COMPARISON: 10/01/2019 CLINICAL HISTORY: Sepsis FINDINGS: LOWER CHEST: There is atelectasis in the lung bases. LIVER: The liver is unremarkable. GALLBLADDER AND BILE DUCTS: Gallbladder surgically absent. No biliary ductal dilatation. SPLEEN: No acute abnormality. PANCREAS: No acute abnormality. ADRENAL GLANDS: No acute abnormality. KIDNEYS, URETERS AND BLADDER: There is a punctate calculus in the inferior pole of the left kidney. There is mild nonspecific bilateral perinephric vascular stranding. There is some scarring in the lateral left kidney. No hydronephrosis. Urinary bladder is unremarkable. GI AND BOWEL: Stomach demonstrates no acute abnormality. There is diffuse colonic diverticulosis. The appendix appears normal. There is no bowel obstruction. PERITONEUM AND  RETROPERITONEUM: No ascites. No free air. VASCULATURE: Aorta is normal in caliber. There are atherosclerotic calcifications of the aorta. LYMPH NODES: No lymphadenopathy. REPRODUCTIVE ORGANS: No acute abnormality. BONES AND SOFT TISSUES: Degenerative changes affect the spine. There is no focal osseous lesion. There is a very small fat containing left inguinal hernia seen best on coronal view. IMPRESSION: 1. No acute findings in the abdomen or pelvis. 2. Cholecystectomy without complication. 3. Diffuse colonic diverticulosis without evidence of acute diverticulitis. 4. Aortic atherosclerosis without aneurysm. 5. Nonobstructing punctate left inferior pole renal calculus. 6. Bilateral mild perinephric stranding and left renal cortical scarring, likely chronic. Electronically signed by: Greig Pique MD 10/29/2024 06:52 PM EST RP Workstation: [id number]   Recent Results (from the past 240 hours)  Blood Culture (routine x 2)     Status: None (Preliminary result)   Collection Time: 10/29/24  5:09 PM   Specimen: BLOOD  Result Value Ref Range Status   Specimen Description BLOOD BLOOD LEFT ARM  Final   Special Requests   Final    BOTTLES DRAWN AEROBIC AND ANAEROBIC Blood Culture adequate volume   Culture  Setup Time   Final    GRAM POSITIVE COCCI ANAEROBIC BOTTLE ONLY Organism ID to follow Performed at [facility], [address], [place], [place] [address]    Culture GRAM POSITIVE COCCI  Final   Report Status PENDING  Incomplete  Resp panel by RT-PCR (RSV, Flu A&B, Covid) Anterior Nasal Swab     Status: None   Collection Time: 10/29/24  5:33 PM   Specimen: Anterior Nasal Swab  Result Value Ref Range Status   SARS Coronavirus 2 by RT PCR NEGATIVE NEGATIVE Final   Influenza A by PCR NEGATIVE NEGATIVE Final   Influenza B by PCR NEGATIVE NEGATIVE Final    Comment: (NOTE) The Xpert Xpress SARS-CoV-2/FLU/RSV plus assay is intended as an aid in the diagnosis of influenza from Nasopharyngeal swab  specimens and should not be used as a sole basis for treatment. Nasal washings and aspirates are unacceptable for Xpert Xpress SARS-CoV-2/FLU/RSV testing.  Fact Sheet for Patients: bloggercourse.com  Fact Sheet for Healthcare Providers: seriousbroker.it  This test is not yet approved or cleared by the [place]  FDA and has been authorized for detection and/or diagnosis of SARS-CoV-2 by FDA under an Emergency Use Authorization (EUA). This EUA will remain in effect (meaning this test can be used) for the duration of the COVID-19 declaration under Section 564(b)(1) of the  Act, 21 U.S.C. section 360bbb-3(b)(1), unless the authorization is terminated or revoked.     Resp Syncytial Virus by PCR NEGATIVE NEGATIVE Final    Comment: (NOTE) Fact Sheet for Patients: bloggercourse.com  Fact Sheet for Healthcare Providers: seriousbroker.it  This test is not yet approved or cleared by the [place]  FDA and has been authorized for detection and/or diagnosis of SARS-CoV-2 by FDA under an Emergency Use Authorization (EUA). This EUA will remain in effect (meaning this test can be used) for the duration of the COVID-19 declaration under Section 564(b)(1) of the Act, 21 U.S.C. section 360bbb-3(b)(1), unless the authorization is terminated or revoked.  Performed at [facility], [address] [place], [place] [address]     Microbiology: Recent Results (from the past 240 hours)  Blood Culture (routine x 2)     Status: None (Preliminary result)   Collection Time: 10/29/24  5:09 PM   Specimen: BLOOD  Result Value Ref Range Status   Specimen Description BLOOD BLOOD LEFT ARM  Final   Special Requests   Final    BOTTLES DRAWN AEROBIC AND ANAEROBIC Blood Culture adequate volume   Culture  Setup Time   Final    GRAM POSITIVE COCCI ANAEROBIC BOTTLE ONLY Organism ID to  follow Performed at [facility], [address] [place], [place] [address]    Culture GRAM POSITIVE COCCI  Final   Report Status PENDING  Incomplete  Resp panel by RT-PCR (RSV, Flu A&B, Covid) Anterior Nasal Swab     Status: None   Collection Time: 10/29/24  5:33 PM   Specimen: Anterior Nasal Swab  Result Value Ref Range Status   SARS Coronavirus 2 by RT PCR NEGATIVE NEGATIVE Final   Influenza A by PCR NEGATIVE NEGATIVE Final   Influenza B by PCR NEGATIVE NEGATIVE Final    Comment: (NOTE) The Xpert Xpress SARS-CoV-2/FLU/RSV plus assay is intended as an aid in the diagnosis of influenza from Nasopharyngeal swab specimens and should not be used as a sole basis for treatment. Nasal washings and aspirates are unacceptable for Xpert Xpress SARS-CoV-2/FLU/RSV testing.  Fact Sheet for Patients: bloggercourse.com  Fact Sheet for Healthcare Providers: seriousbroker.it  This test is not yet approved or cleared by the [place]  FDA and has been authorized for detection and/or diagnosis of SARS-CoV-2 by FDA under an Emergency Use Authorization (EUA). This EUA will remain in effect (meaning this test can be used) for the duration of the COVID-19 declaration under Section 564(b)(1) of the Act, 21 U.S.C. section 360bbb-3(b)(1), unless the authorization is terminated or revoked.     Resp Syncytial Virus by PCR NEGATIVE NEGATIVE Final    Comment: (NOTE) Fact Sheet for Patients: bloggercourse.com  Fact Sheet for Healthcare Providers: seriousbroker.it  This test is not yet approved or cleared by the [place]  FDA and has been authorized for detection and/or diagnosis of SARS-CoV-2 by FDA under an Emergency Use Authorization (EUA). This EUA will remain in effect (meaning this test can be used) for the duration of the COVID-19 declaration under Section 564(b)(1) of the Act, 21  U.S.C. section 360bbb-3(b)(1), unless the authorization is terminated or revoked.  Performed at [facility], [address], [place], [place] [address]     Radiographs and labs were personally reviewed by me.   Reyes Fenton, MD [facility] [phone] 10/30/2024, 8:59 AM     [1]  Social History Tobacco Use   Smoking status:  Never   Smokeless tobacco: Never  Vaping Use   Vaping status: Never Used  Substance Use Topics   Alcohol use: Never   Drug use: Never

## 2024-10-30 NOTE — Procedures (Signed)
 Interventional Radiology Procedure Note  Risks and benefits of arthrogram and aspiration were discussed with the patient including, but not limited to bleeding, infection, injection of contrast outside the joint, and damage to adjacent structures.  All of the patient's questions were answered, patient is agreeable to proceed. Consent signed and in chart.  A timeout was performed with all members of the team prior to start of the procedure. Correct patient and correct procedure was confirmed. Allergies were reviewed.   PROCEDURE SUMMARY:  Successful fluoro guided right hip arthrogram and aspiration. No immediate complications.  Pt tolerated well.   EBL = none  Please see full dictation in imaging section of Epic for procedure details.   Electronically Signed: Carlin DELENA Griffon, PA-C 10/30/2024, 12:25 PM

## 2024-10-30 NOTE — H&P (Signed)
 History and Physical    Patient: Duane Greene FMW:[id number] DOB: [birth year] DOA: 10/29/2024 DOS: the patient was seen and examined on 10/30/2024 PCP: [name] LABOR, MD  Patient coming from: Home  Chief Complaint: No chief complaint on file.  HPI: Duane Greene is a 73 y.o. male with medical history significant of HTN, HLD, OSA (on CPAP), and OA b/l hips s/p US  guided steroid injection on 12/2 who p/w R hip joint swelling and imaging/arthrocentesis c/f R hip septic arthritis.   The patient presented to ED after having ongoing pain in R hip following b/l hip steroid injections on 12/2. Per wife, pt experienced worsening R hip pain since 12/2 accompanied by decreased mobility and eventually immobility. He was evaluated at [facility] ED on 12/9 at 1005, where XR completed were c/w OA; as such, pt sent home with PO oxycodone  after receiving IV toradol  in the ED with some relief. He was also evaluated by Dr. Delane, who performed US  hip injection on 12/2) later that day on 12/9, and OP MRI ordered for possible avascular necrosis. Pt returned home, took around the clock pain meds (with little to no BMs) and laid in his recliner because of the pain until it became so unbearable that he presented to the ED today.  In the ED, pt febrile 100.4 deg F x1. Labs notable for WBC 12.9-->12.3, lactic acid 2-->1.1, and CRP 27.6. UA w/ rare bacteruria and WBC 0-5. Blood cultures pending, but blood culture ID panel c/w S. aureus. EDP consulted orthopedic surgery, ID, and requested medicine for admission.   Review of Systems: As mentioned in the history of present illness. All other systems reviewed and are negative. Past Medical History:  Diagnosis Date   Arthritis    History of kidney stones    Hyperlipidemia    Hypertension    Right inguinal hernia    Sleep apnea    uses CPAP nightly   Past Surgical History:  Procedure Laterality Date   CHOLECYSTECTOMY N/A 06/13/2020   Procedure: LAPAROSCOPIC  CHOLECYSTECTOMY;  Surgeon: Belinda Cough, MD;  Location: [facility] OR;  Service: General;  Laterality: N/A;   COLONOSCOPY     EXTRACORPOREAL SHOCK WAVE LITHOTRIPSY Left 10/18/2019   Procedure: EXTRACORPOREAL SHOCK WAVE LITHOTRIPSY (ESWL);  Surgeon: Renda Glance, MD;  Location: [facility]S;  Service: Urology;  Laterality: Left;   INGUINAL HERNIA REPAIR Right 06/30/2019   Procedure: RIGHT INGUINAL HERNIA REPAIR WITH MESH;  Surgeon: Belinda Cough, MD;  Location: [facility];  Service: General;  Laterality: Right;   INTRAOPERATIVE CHOLANGIOGRAM N/A 06/13/2020   Procedure: INTRAOPERATIVE CHOLANGIOGRAM;  Surgeon: Belinda Cough, MD;  Location: [facility] OR;  Service: General;  Laterality: N/A;   KNEE ARTHROSCOPY Left    LUMBAR LAMINECTOMY/DECOMPRESSION MICRODISCECTOMY N/A 08/28/2020   Procedure: Laminectomy - Lumbar One-Two, Lumbar Two-Three, Lumbar Three-Four, Lumbar Four-Five, Lumbar Five-Sacral One;  Surgeon: Joshua Alm RAMAN, MD;  Location: [facility] OR;  Service: Neurosurgery;  Laterality: N/A;  posterior   REVERSE SHOULDER ARTHROPLASTY Right 09/06/2022   Procedure: REVERSE Right SHOULDER ARTHROPLASTY;  Surgeon: Kay Kemps, MD;  Location: [facility];  Service: Orthopedics;  Laterality: Right;  120 hours choice and interscalene block   SHOULDER ARTHROSCOPY Bilateral    TONSILLECTOMY     Social History:  reports that he has never smoked. He has never used smokeless tobacco. He reports that he does not drink alcohol and does not use drugs.  Allergies[1]  Family History  Problem Relation Age of Onset   Heart Problems Father  CVA Father     Prior to Admission medications  Medication Sig Start Date End Date Taking? Authorizing Provider  aspirin  EC 81 MG tablet Take 81 mg by mouth at bedtime. Swallow whole.   Yes [provider]  cholecalciferol  (VITAMIN D3) 25 MCG (1000 UNIT) tablet Take 1,000 Units by mouth daily.   Yes [provider]  gabapentin  (NEURONTIN ) 300 MG capsule Take 1  capsule (300 mg total) by mouth at bedtime. 12/31/23  Yes   losartan -hydrochlorothiazide  (HYZAAR ) 50-12.5 MG tablet Take 1 tablet by mouth daily. 05/06/24  Yes   Multiple Vitamins-Minerals (MULTIVITAMIN WITH MINERALS) tablet Take 1 tablet by mouth at bedtime.   Yes [provider]  oxyCODONE  (OXY IR/ROXICODONE ) 5 MG immediate release tablet Take 1 tablet (5 mg total) by mouth every 6 (six) hours as needed for pain for 7 days. 10/26/24  Yes   simvastatin  (ZOCOR ) 40 MG tablet Take 1 tablet (40 mg total) by mouth every evening. Patient taking differently: Take 40 mg by mouth at bedtime. 12/29/23  Yes   terbinafine  (LAMISIL ) 250 MG tablet Take 1 tablet (250 mg total) by mouth every 7 (seven) days to prevent onychomycosis. Patient taking differently: Take 250 mg by mouth every Monday. 09/29/24  Yes   triamcinolone  cream (KENALOG ) 0.1 % Apply 1 Application topically daily as needed. 07/05/24  Yes     Physical Exam: Vitals:   10/30/24 0821 10/30/24 0945 10/30/24 1130 10/30/24 1345  BP:  117/61 122/61 (!) 118/54  Pulse:  72 71 72  Resp:      Temp: 98.7 F (37.1 C)     TempSrc: Oral     SpO2:  97% 100% 100%   General: Alert, oriented x3, resting comfortably in no acute distress Respiratory: Lungs clear to auscultation bilaterally with normal respiratory effort; no w/r/r Cardiovascular: Regular rate and rhythm w/o m/r/g Abdomen: Soft, nontender, nondistended. Positive bowel sounds MSK: R hip with bandage in place c/d/i   Data Reviewed:  Lab Results  Component Value Date   WBC 12.3 (H) 10/30/2024   HGB 14.4 10/30/2024   HCT 40.5 10/30/2024   MCV 92.9 10/30/2024   PLT 106 (L) 10/30/2024   Lab Results  Component Value Date   GLUCOSE 165 (H) 10/30/2024   CALCIUM 7.9 (L) 10/30/2024   NA 134 (L) 10/30/2024   K 3.6 10/30/2024   CO2 26 10/30/2024   CL 100 10/30/2024   BUN 22 10/30/2024   CREATININE 0.87 10/30/2024   CREATININE 0.88 10/30/2024   Lab Results  Component Value Date    ALT 50 (H) 10/30/2024   AST 51 (H) 10/30/2024   ALKPHOS 103 10/30/2024   BILITOT 1.2 10/30/2024   Lab Results  Component Value Date   INR 1.2 10/29/2024   INR 1.1 08/24/2020   Radiology: DG FL ASP/INJ MAJOR (SHOULDER, HIP, KNEE) Addendum Date: 10/30/2024 ADDENDUM REPORT: 10/30/2024 12:37 ADDENDUM: CLINICAL DATA: 73 year old male with right hip pain, concerning for septic arthritis. IR requested for diagnostic right hip intra-articular aspiration. Electronically Signed   By: Ester Sides M.D.   On: 10/30/2024 12:37   Result Date: 10/30/2024 CLINICAL DATA:  73 year old male with right hip pain, concerning for septic arthritis. IR requested for diagnostic right hip intra-articular aspiration. EXAM: RIGHT HIP INJECTION AND ASPIRATION UNDER FLUOROSCOPY COMPARISON:  MR Hip Right w w/o cm on 10/29/2024. FLUOROSCOPY: Radiation Exposure Index (as provided by the fluoroscopic device): 7.0 mGy Kerma PROCEDURE: Overlying skin prepped with Betadine, draped in the usual sterile fashion, and  infiltrated locally with buffered Lidocaine . Straight 20 gauge spinal needle advanced to the superolateral margin of the right femoral head, with immediate return of turbid, straw color fluid in needle hub, which was aspirated. Diagnostic injection of iodinated contrast demonstrates intra-articular spread without intravascular component. Subsequent aspiration of fluid was attempted post contrast injection, with total of 1 mL of fluid aspirated. Needle was then removed.  No immediate complication. IMPRESSION: Technically successful right hip injection and aspiration under fluoroscopy. Procedure performed by: Carlin Griffon, PA-C. Electronically Signed: By: Ester Sides M.D. On: 10/30/2024 12:34   MR HIP RIGHT W WO CONTRAST Result Date: 10/30/2024 CLINICAL DATA:  Chronic right hip pain and groin pain EXAM: MRI OF THE RIGHT HIP WITHOUT AND WITH CONTRAST TECHNIQUE: Multiplanar, multisequence MR imaging was performed both  before and after administration of intravenous contrast. CONTRAST:  9mL GADAVIST  GADOBUTROL  1 MMOL/ML IV SOLN COMPARISON:  Hip x-ray 10/26/2024 FINDINGS: Bones: No hip fracture, dislocation or avascular necrosis. No periosteal reaction or bone destruction. No aggressive osseous lesion. Normal sacrum and sacroiliac joints. No SI joint widening or erosive changes. Lower lumbar spine spondylosis. Articular cartilage and labrum Articular cartilage: High-grade partial is cartilage loss of the right anterosuperior femoral head and acetabulum. Labrum:  Nondisplaced right anterosuperior labral tear. Joint or bursal effusion Joint effusion: Large right hip joint effusion. No left hip joint effusion. No SI joint effusion. Bursae:  No bursal fluid. Muscles and tendons Flexors: Normal. Extensors: Normal. Abductors: Normal. Adductors: Normal. Gluteals: Severe muscle edema in the right gluteus minimus muscle. Mild muscle edema in the abductor musculature and perifascial edema. Small fluid collection in the right gluteus minimus muscle measuring 5.9 x 0.9 x 3.5 cm. Hamstrings: Normal. Other findings No pelvic free fluid. No fluid collection or hematoma. No inguinal lymphadenopathy. No inguinal hernia. IMPRESSION: 1. Large right hip joint effusion with severe muscle edema in the right gluteus minimus muscle and mild muscle edema in the abductor musculature and perifascial edema. Small abscess in the right gluteus minimus muscle measuring 5.9 x 0.9 x 3.5 cm. Findings most concerning for septic arthritis and myositis. 2. High-grade partial is cartilage loss of the right anterosuperior femoral head and acetabulum. 3. Nondisplaced right anterosuperior labral tear. Electronically Signed   By: Julaine Blanch M.D.   On: 10/30/2024 08:15   CT ABDOMEN PELVIS W CONTRAST Result Date: 10/29/2024 EXAM: CT ABDOMEN AND PELVIS WITH CONTRAST 10/29/2024 05:52:43 PM TECHNIQUE: CT of the abdomen and pelvis was performed with the administration of 75  mL of iohexol  (OMNIPAQUE ) 350 MG/ML injection. Multiplanar reformatted images are provided for review. Automated exposure control, iterative reconstruction, and/or weight-based adjustment of the mA/kV was utilized to reduce the radiation dose to as low as reasonably achievable. COMPARISON: 10/01/2019 CLINICAL HISTORY: Sepsis FINDINGS: LOWER CHEST: There is atelectasis in the lung bases. LIVER: The liver is unremarkable. GALLBLADDER AND BILE DUCTS: Gallbladder surgically absent. No biliary ductal dilatation. SPLEEN: No acute abnormality. PANCREAS: No acute abnormality. ADRENAL GLANDS: No acute abnormality. KIDNEYS, URETERS AND BLADDER: There is a punctate calculus in the inferior pole of the left kidney. There is mild nonspecific bilateral perinephric vascular stranding. There is some scarring in the lateral left kidney. No hydronephrosis. Urinary bladder is unremarkable. GI AND BOWEL: Stomach demonstrates no acute abnormality. There is diffuse colonic diverticulosis. The appendix appears normal. There is no bowel obstruction. PERITONEUM AND RETROPERITONEUM: No ascites. No free air. VASCULATURE: Aorta is normal in caliber. There are atherosclerotic calcifications of the aorta. LYMPH NODES: No lymphadenopathy. REPRODUCTIVE ORGANS:  No acute abnormality. BONES AND SOFT TISSUES: Degenerative changes affect the spine. There is no focal osseous lesion. There is a very small fat containing left inguinal hernia seen best on coronal view. IMPRESSION: 1. No acute findings in the abdomen or pelvis. 2. Cholecystectomy without complication. 3. Diffuse colonic diverticulosis without evidence of acute diverticulitis. 4. Aortic atherosclerosis without aneurysm. 5. Nonobstructing punctate left inferior pole renal calculus. 6. Bilateral mild perinephric stranding and left renal cortical scarring, likely chronic. Electronically signed by: Greig Pique MD 10/29/2024 06:52 PM EST RP Workstation: [id number]    Assessment and Plan: 41M  h/o HTN, HLD, OSA (on CPAP), and OA b/l hips s/p US  guided steroid injection on 12/2 who p/w R hip joint swelling and imaging/arthrocentesis c/f R hip septic arthritis.   R hip septic arthritis GPC bacteremia -ID consulted; apprec eval/recs (IV cefazolin  alone for now) -Ortho consulted; apprec eval/recs (s/p R hip aspiration on 12/13) -F/u blood and R hip cultures; adjust abx accordingly -Multimodal pain control w/ tylenol  1g TID, robaxin  500mg  TID, gabapentin  300mg  TID, and oxycodone  5mg  q4h prn -F/u TTE to eval for IE given bacteremia -Will need repeat blood cultures in 24-48h to document clearance  HTN -PTA losartan -hydrochlorothiazide  50-12.5mg  daily  OSA -CPAP at bedtime   Advance Care Planning:   Code Status: Limited: Do not attempt resuscitation (DNR) -DNR-LIMITED -Do Not Intubate/DNI    Consults: Ortho and ID  Family Communication: Wife  Severity of Illness: The appropriate patient status for this patient is INPATIENT. Inpatient status is judged to be reasonable and necessary in order to provide the required intensity of service to ensure the patient's safety. The patient's presenting symptoms, physical exam findings, and initial radiographic and laboratory data in the context of their chronic comorbidities is felt to place them at high risk for further clinical deterioration. Furthermore, it is not anticipated that the patient will be medically stable for discharge from the hospital within 2 midnights of admission.   * I certify that at the point of admission it is my clinical judgment that the patient will require inpatient hospital care spanning beyond 2 midnights from the point of admission due to high intensity of service, high risk for further deterioration and high frequency of surveillance required.*   ------- I spent 56 minutes reviewing previous notes, at the bedside counseling/discussing the treatment plan, and performing clinical documentation.  Author: Marsha Ada, MD 10/30/2024 1:51 PM  For on call review [email or web address].     [1] No Known Allergies

## 2024-10-30 NOTE — ED Provider Notes (Addendum)
 Emergency Medicine Observation Re-evaluation Note  Duane Greene is a 73 y.o. male, seen on rounds today.  Pt initially presented to the ED for complaints of No chief complaint on file. Currently, the patient is resting.  Is having continued hip pain but no other new complaints.  He denies any other obvious source of a fever such as cough, sore throat, runny nose, urine or abdominal symptoms or new rash besides a developing decubitus injury.  Physical Exam  BP (!) 121/58   Pulse 86   Temp (!) 100.4 F (38 C) (Oral)   Resp 20   SpO2 94%  Physical Exam General: No distress Lungs: Normal effort Psych: No agitation Skin: Mild skin breakdown near the buttocks but no ulceration or abscess  ED Course / MDM  EKG:EKG Interpretation Date/Time:  Friday October 29 2024 17:29:14 EST Ventricular Rate:  74 PR Interval:  198 QRS Duration:  84 QT Interval:  372 QTC Calculation: 412 R Axis:   -7  Text Interpretation: Normal sinus rhythm Cannot rule out Anterior infarct , age undetermined Abnormal ECG No significant change since last tracing Confirmed by Ellouise Fine ([id number]) on 10/29/2024 6:27:34 PM  I have reviewed the labs performed to date as well as medications administered while in observation.  Recent changes in the last 24 hours include home meds.  Plan  Current plan is for [facility] consult.  However with his continued low-grade temperature, I will recheck labs, send a urine culture (and repeat urine given age).  Will send CK.    Freddi Hamilton, MD 10/30/24 [phone]  Addendum: Radiology has officially read his MRI which now shows large joint effusion and concern for septic arthritis and myositis with small abscess.  He is clinically stable and well-appearing. I discussed with Dr. Eben, who will consult for ID. Dr. Georgina will admit for the hospitalist team. IR is going to aspirate this morning. Discussed with orthopedics, Dr. Georgina, who will see the patient. He asks for antibiotics to be  held until fluid is obtained.   Freddi Hamilton, MD 10/30/24 [phone]

## 2024-10-30 NOTE — Consult Note (Signed)
 Orthopedic Surgery Consult Note  Assessment: Patient is a 73 y.o. male with right hip septic arthritis, right gluteus muscles myositis   Plan: -Given his aspirate results that showed no crystals but 111,000 white blood cells, I recommended operative incision and drainage of the hip joint for septic arthritis -The patient reports that he has a knee replacement with Dr. Mariea with [facility] scheduled for Monday.  I advised him to cancel that since he has an ongoing infection -Recommend infectious disease consult -Diet: N.p.o. at midnight -DVT ppx: Per primary  -Weight bearing status: As tolerated -PT evaluate and treat -Pain control -Dispo: pending completion of operative plans   Discussed recommendation for operative intervention in the form of right hip incision and drainage. Explained the risks of this procedure included, but were not limited to: infection, bleeding, stiffness, hip dislocation, sciatic nerve injury, need for additional procedures, recurrent or different infection, deep vein thrombosis, pulmonary embolism, and death. The benefits of this procedure would be to decrease the infectious burden within the hip joint.  I explained that I would not debride the infected appearing muscles since that would result in permanent weakness.  I believe that the myositis can be treated with antibiotics alone.  The alternatives of this surgery would be to treat the septic arthritis with antibiotics alone or to do no intervention. The patient and his wife's questions were answered to their satisfaction. After this discussion, patient elected to proceed with surgery. Informed consent was obtained.   ___________________________________________________________________________   Reason for consult: rule out right hip septic arthritis  History:  Patient is a 73 y.o. male who patient has had several weeks of right hip pain.  It has gotten worse within the last week.  He has had difficulty  with mobilizing.  He has been mostly in chairs or bed.  His wife states that at 1 point their son-in-law had to help move him.  He has been seeing Dr[facility] Delane with [facility] for this right hip pain.  A MRI was ordered but the patient was not able to get it.  He also had a hip injection with Dr. Delane.  His pain has been persistent and getting worse.  He was advised to go to the ER.  Per the ED, [facility] was called but deferred to the on-call physician.  MRI had been ordered.  At the time of seeing the patient, he had a MRI of the hip and an aspirate of the right hip.  He is still reporting significant right hip pain.  Pain medications are helping control the pain.  He continues to have difficulty with mobilizing even in the hospital setting with pain medication.   Past medical history:  HTN HLD OSA  Allergies: NKDA   Past surgical history:  Cholecystectomy Tonsillectomy Hernia repair Lumbar decompression surgery Shoulder arthroplasty Shoulder arthroscopy  Social history: Denies use of nicotine-containing products (cigarettes, vaping, smokeless, etc.) Alcohol use: denies Denies use of recreational drugs  Family history: -reviewed and not pertinent to septic arthritis and myositis   Physical Exam:  General: no acute distress, appears stated age Neurologic: alert, answering questions appropriately, following commands Cardiovascular: regular rate, no cyanosis Respiratory: unlabored breathing on room air, symmetric chest rise Psychiatric: appropriate affect, normal cadence to speech  MSK:   -Right lower extremity  No tenderness to palpation over extremity, no gross deformity, no open wounds  Pain with logroll, pain with passive range of motion at the hip Fires hip flexors, quadriceps, hamstrings, tibialis anterior, gastrocnemius  and soleus, extensor hallucis longus Plantarflexes and dorsiflexes toes Sensation intact to light touch in sural, saphenous, tibial, deep  peroneal, and superficial peroneal nerve distributions Foot warm and well perfused  Imaging: MRI of the right hip from 10/29/2024 was independently reviewed and interpreted, showing increased T2 signal within the gluteus muscles.  There is an effusion within the hip joint.  No fracture or dislocation seen.   Patient name: Duane Greene Patient MRN: [id number] Date: 10/30/2024

## 2024-10-30 NOTE — Progress Notes (Addendum)
 Patient is an established patient of [facility] and has had surgery with them in the past. Looks like a hip MRI was ordered by [facility] on 10/26/2024. The on-call provider for [facility] was called by the emergency department but deferred to [facility] on-call because that doctor felt that this was a separate issue from what the patient had been seen for by their group per my conversation with the ER. I am not sure what the MRI of the hip was ordered for if it was not to work up hip pain but I cannot see their notes. As a result of this deferral, I was consulted.  I agree with the radiologist interpretation that there is significant T2 signal within the gluteus musculature that appears to be consistent with myositis.  There is also effusion of the hip joint.  The ER has already got him set up for a aspiration of the hip to rule out septic arthritis.  I agree with this plan.  I will follow the results.  If the results turn positive either with a cell count greater than 50K or Gram stain positive or culture that returns positive, would recommend incision and drainage of the hip.  Please hold antibiotics until the specimen is collected by radiology.  Full consult note to follow.   Duane DELENA Ada, MD Orthopedic Surgeon

## 2024-10-31 ENCOUNTER — Encounter (HOSPITAL_COMMUNITY): Admission: EM | Disposition: A | Payer: Self-pay | Source: Home / Self Care | Attending: Hospitalist

## 2024-10-31 ENCOUNTER — Inpatient Hospital Stay (HOSPITAL_COMMUNITY): Admitting: Anesthesiology

## 2024-10-31 ENCOUNTER — Inpatient Hospital Stay (HOSPITAL_COMMUNITY)

## 2024-10-31 ENCOUNTER — Encounter (HOSPITAL_COMMUNITY): Payer: Self-pay | Admitting: Hospitalist

## 2024-10-31 DIAGNOSIS — Z792 Long term (current) use of antibiotics: Secondary | ICD-10-CM | POA: Diagnosis not present

## 2024-10-31 DIAGNOSIS — B9561 Methicillin susceptible Staphylococcus aureus infection as the cause of diseases classified elsewhere: Secondary | ICD-10-CM | POA: Diagnosis not present

## 2024-10-31 DIAGNOSIS — M00051 Staphylococcal arthritis, right hip: Secondary | ICD-10-CM | POA: Diagnosis not present

## 2024-10-31 DIAGNOSIS — E785 Hyperlipidemia, unspecified: Secondary | ICD-10-CM

## 2024-10-31 DIAGNOSIS — I10 Essential (primary) hypertension: Secondary | ICD-10-CM

## 2024-10-31 DIAGNOSIS — G4730 Sleep apnea, unspecified: Secondary | ICD-10-CM

## 2024-10-31 DIAGNOSIS — M009 Pyogenic arthritis, unspecified: Secondary | ICD-10-CM

## 2024-10-31 LAB — BASIC METABOLIC PANEL WITH GFR
Anion gap: 11 (ref 5–15)
BUN: 25 mg/dL — ABNORMAL HIGH (ref 8–23)
CO2: 25 mmol/L (ref 22–32)
Calcium: 8.3 mg/dL — ABNORMAL LOW (ref 8.9–10.3)
Chloride: 99 mmol/L (ref 98–111)
Creatinine, Ser: 0.88 mg/dL (ref 0.61–1.24)
GFR, Estimated: >60 mL/min (ref >60)
Glucose, Bld: 184 mg/dL — ABNORMAL HIGH (ref 70–99)
Potassium: 3.5 mmol/L (ref 3.5–5.1)
Sodium: 135 mmol/L (ref 135–145)

## 2024-10-31 LAB — CBC
HCT: 38.9 % — ABNORMAL LOW (ref 39.0–52.0)
Hemoglobin: 13.8 g/dL (ref 13.0–17.0)
MCH: 32.5 pg (ref 26.0–34.0)
MCHC: 35.5 g/dL (ref 30.0–36.0)
MCV: 91.5 fL (ref 80.0–100.0)
Platelets: 133 K/uL — ABNORMAL LOW (ref 150–400)
RBC: 4.25 MIL/uL (ref 4.22–5.81)
RDW: 13.2 % (ref 11.5–15.5)
WBC: 14.4 K/uL — ABNORMAL HIGH (ref 4.0–10.5)
nRBC: 0 % (ref 0.0–0.2)

## 2024-10-31 LAB — URINE CULTURE: Culture: NO GROWTH

## 2024-10-31 LAB — GLUCOSE, BODY FLUID OTHER: Glucose, Body Fluid Other: 8 mg/dL

## 2024-10-31 SURGERY — IRRIGATION AND DEBRIDEMENT WOUND
Anesthesia: General | Site: Hip | Laterality: Right

## 2024-10-31 MED ORDER — PROPOFOL 10 MG/ML IV BOLUS
INTRAVENOUS | Status: AC
  Filled 2024-10-31: qty 20

## 2024-10-31 MED ORDER — ORAL CARE MOUTH RINSE: 15.0000 mL | Freq: Once | OROMUCOSAL | Status: AC

## 2024-10-31 MED ORDER — TRANEXAMIC ACID-NACL 1000-0.7 MG/100ML-% IV SOLN
1000.0000 mg | INTRAVENOUS | Status: AC
  Administered 2024-10-31: 1000 mg via INTRAVENOUS
  Filled 2024-10-31: qty 100

## 2024-10-31 MED ORDER — ONDANSETRON HCL 4 MG/2ML IJ SOLN
INTRAMUSCULAR | Status: DC | PRN
  Administered 2024-10-31: 4 mg via INTRAVENOUS

## 2024-10-31 MED ORDER — CEFAZOLIN SODIUM-DEXTROSE 2-4 GM/100ML-% IV SOLN
INTRAVENOUS | Status: AC
  Filled 2024-10-31: qty 100

## 2024-10-31 MED ORDER — LACTATED RINGERS IV SOLN: INTRAVENOUS | Status: DC | PRN

## 2024-10-31 MED ORDER — SUGAMMADEX SODIUM 200 MG/2ML IV SOLN
INTRAVENOUS | Status: DC | PRN
  Administered 2024-10-31: 200 mg via INTRAVENOUS

## 2024-10-31 MED ORDER — VANCOMYCIN HCL 1000 MG IV SOLR
INTRAVENOUS | Status: DC | PRN
  Administered 2024-10-31: 1000 mg via TOPICAL

## 2024-10-31 MED ORDER — DEXAMETHASONE SODIUM PHOSPHATE 4 MG/ML IJ SOLN
INTRAMUSCULAR | Status: DC | PRN
  Administered 2024-10-31: 5 mg via INTRAVENOUS

## 2024-10-31 MED ORDER — FENTANYL CITRATE (PF) 100 MCG/2ML IJ SOLN: 25.0000 ug | INTRAMUSCULAR | Status: DC | PRN

## 2024-10-31 MED ORDER — CHLORHEXIDINE GLUCONATE 0.12 % MT SOLN
15.0000 mL | Freq: Once | OROMUCOSAL | Status: AC
  Administered 2024-10-31: 15 mL via OROMUCOSAL

## 2024-10-31 MED ORDER — PHENYLEPHRINE HCL (PRESSORS) 10 MG/ML IV SOLN
INTRAVENOUS | Status: DC | PRN
  Administered 2024-10-31: 80 ug via INTRAVENOUS

## 2024-10-31 MED ORDER — LACTATED RINGERS IV SOLN: INTRAVENOUS | Status: DC

## 2024-10-31 MED ORDER — PHENYLEPHRINE HCL-NACL 20-0.9 MG/250ML-% IV SOLN
INTRAVENOUS | Status: DC | PRN
  Administered 2024-10-31: 50 ug/min via INTRAVENOUS

## 2024-10-31 MED ORDER — PROPOFOL 10 MG/ML IV BOLUS
INTRAVENOUS | Status: DC | PRN
  Administered 2024-10-31: 120 mg via INTRAVENOUS

## 2024-10-31 MED ORDER — 0.9 % SODIUM CHLORIDE (POUR BTL) OPTIME
TOPICAL | Status: DC | PRN
  Administered 2024-10-31: 1000 mL

## 2024-10-31 MED ORDER — FENTANYL CITRATE (PF) 250 MCG/5ML IJ SOLN
INTRAMUSCULAR | Status: AC
  Filled 2024-10-31: qty 5

## 2024-10-31 MED ORDER — VANCOMYCIN HCL 1000 MG IV SOLR
INTRAVENOUS | Status: AC
  Filled 2024-10-31: qty 20

## 2024-10-31 MED ORDER — SODIUM CHLORIDE 0.9 % IR SOLN
Status: DC | PRN
  Administered 2024-10-31: 3000 mL

## 2024-10-31 MED ORDER — LIDOCAINE HCL (CARDIAC) PF 50 MG/5ML IV SOSY
PREFILLED_SYRINGE | INTRAVENOUS | Status: DC | PRN
  Administered 2024-10-31: 60 mg via INTRAVENOUS

## 2024-10-31 MED ORDER — TRANEXAMIC ACID 1000 MG/10ML IV SOLN: INTRAVENOUS | Status: DC | PRN

## 2024-10-31 MED ORDER — ROCURONIUM BROMIDE 100 MG/10ML IV SOLN
INTRAVENOUS | Status: DC | PRN
  Administered 2024-10-31: 50 mg via INTRAVENOUS
  Administered 2024-10-31: 10 mg via INTRAVENOUS

## 2024-10-31 MED ORDER — FENTANYL CITRATE (PF) 250 MCG/5ML IJ SOLN
INTRAMUSCULAR | Status: DC | PRN
  Administered 2024-10-31 (×5): 50 ug via INTRAVENOUS

## 2024-10-31 SURGICAL SUPPLY — 45 items
ALCOHOL 70% 16 OZ (MISCELLANEOUS) ×1 IMPLANT
BAG COUNTER SPONGE SURGICOUNT (BAG) ×1 IMPLANT
BNDG COHESIVE 4X5 TAN STRL LF (GAUZE/BANDAGES/DRESSINGS) IMPLANT
CNTNR URN SCR LID CUP LEK RST (MISCELLANEOUS) IMPLANT
COVER SURGICAL LIGHT HANDLE (MISCELLANEOUS) ×1 IMPLANT
DRAPE C-ARM 42X72 X-RAY (DRAPES) IMPLANT
DRAPE IMP U-DRAPE 54X76 (DRAPES) ×1 IMPLANT
DRAPE SURG ORHT 6 SPLT 77X108 (DRAPES) IMPLANT
DRAPE U-SHAPE 47X51 STRL (DRAPES) ×1 IMPLANT
DRSG AQUACEL AG ADV 3.5X10 (GAUZE/BANDAGES/DRESSINGS) IMPLANT
DURAPREP 26ML APPLICATOR (WOUND CARE) ×1 IMPLANT
ELECTRODE REM PT RTRN 9FT ADLT (ELECTROSURGICAL) ×1 IMPLANT
GAUZE PAD ABD 8X10 STRL (GAUZE/BANDAGES/DRESSINGS) IMPLANT
GLOVE BIO SURGEON STRL SZ7.5 (GLOVE) ×1 IMPLANT
GLOVE INDICATOR 7.5 STRL GRN (GLOVE) ×1 IMPLANT
GLOVE SS BIOGEL STRL SZ 7.5 (GLOVE) ×1 IMPLANT
GOWN STRL REUS W/ TWL XL LVL3 (GOWN DISPOSABLE) ×1 IMPLANT
GOWN STRL SURGICAL XL XLNG (GOWN DISPOSABLE) ×1 IMPLANT
KIT BASIN OR (CUSTOM PROCEDURE TRAY) ×1 IMPLANT
KIT TURNOVER KIT B (KITS) ×1 IMPLANT
MANIFOLD NEPTUNE II (INSTRUMENTS) ×1 IMPLANT
NDL 22X1.5 STRL (OR ONLY) (MISCELLANEOUS) IMPLANT
NEEDLE 22X1.5 STRL (OR ONLY) (MISCELLANEOUS) IMPLANT
PACK ORTHO EXTREMITY (CUSTOM PROCEDURE TRAY) ×1 IMPLANT
PAD ARMBOARD POSITIONER FOAM (MISCELLANEOUS) ×1 IMPLANT
PENCIL SMOKE EVACUATOR (MISCELLANEOUS) IMPLANT
RESTRAINT HEAD UNIVERSAL NS (MISCELLANEOUS) ×1 IMPLANT
SET CYSTO IRRIGATION (SET/KITS/TRAYS/PACK) ×1 IMPLANT
SOL PREP POV-IOD 4OZ 10% (MISCELLANEOUS) ×1 IMPLANT
SOLN 0.9% NACL POUR BTL 1000ML (IV SOLUTION) ×1 IMPLANT
SOLN STERILE WATER BTL 1000 ML (IV SOLUTION) ×1 IMPLANT
SPONGE T-LAP 18X18 ~~LOC~~+RFID (SPONGE) ×2 IMPLANT
SPONGE T-LAP 4X18 ~~LOC~~+RFID (SPONGE) IMPLANT
STAPLER SKIN PROX 35W (STAPLE) IMPLANT
STOCKINETTE IMPERVIOUS 9X36 MD (GAUZE/BANDAGES/DRESSINGS) IMPLANT
SUCTION TUBE FRAZIER 10FR DISP (SUCTIONS) IMPLANT
SUT ETHILON 2 0 FS 18 (SUTURE) ×1 IMPLANT
SUT PDS AB 0 CT 36 (SUTURE) IMPLANT
SUT PDS AB 2-0 CT1 27 (SUTURE) ×1 IMPLANT
SUT VIC AB 0 CT1 18XCR BRD 8 (SUTURE) ×1 IMPLANT
SUT VIC AB 2-0 CT1 18 (SUTURE) ×1 IMPLANT
SUT VIC AB 2-0 CT2 18 VCP726D (SUTURE) ×1 IMPLANT
TOWEL GREEN STERILE (TOWEL DISPOSABLE) ×1 IMPLANT
TOWEL GREEN STERILE FF (TOWEL DISPOSABLE) ×1 IMPLANT
YANKAUER SUCT BULB TIP NO VENT (SUCTIONS) ×1 IMPLANT

## 2024-10-31 NOTE — Progress Notes (Signed)
 [facility]    Date of Admission:  10/29/2024   Total days of antibiotics 1 ancef            ID: Duane Greene is a 73 y.o. male with   Principal Problem:   Septic arthritis (HCC)    Subjective: Feels better Hot Decreased pain when not moving.   Medications:   acetaminophen   1,000 mg Oral TID   cholecalciferol   1,000 Units Oral Daily   enoxaparin  (LOVENOX ) injection  40 mg Subcutaneous Q24H   gabapentin   300 mg Oral TID   losartan   50 mg Oral Daily   And   hydrochlorothiazide   12.5 mg Oral Daily   lidocaine   1 patch Transdermal Q24H   methocarbamol   500 mg Oral TID   multivitamin with minerals  1 tablet Oral QHS   polyethylene glycol  17 g Oral BID   senna-docusate  2 tablet Oral BID   simvastatin   40 mg Oral QPM   [START ON 11/01/2024] terbinafine   250 mg Oral Q Mon    Objective: Vital signs in last 24 hours: Temp:  [98 F (36.7 C)-101.1 F (38.4 C)] 98 F (36.7 C) (12/14 0730) Pulse Rate:  [63-83] 66 (12/14 0730) Resp:  [16-18] 18 (12/14 0730) BP: (92-124)/(41-89) 108/59 (12/14 0730) SpO2:  [95 %-100 %] 97 % (12/14 0730)   General appearance: alert, cooperative, flushed, and no distress Resp: clear to auscultation bilaterally Cardio: regular rate and rhythm GI: normal findings: bowel sounds normal and soft, non-tender  Lab Results Recent Labs    10/30/24 1000 10/31/24 0013  WBC 12.3* 14.4*  HGB 14.4 13.8  HCT 40.5 38.9*  NA 134* 135  K 3.6 3.5  CL 100 99  CO2 26 25  BUN 22 25*  CREATININE 0.88  0.87 0.88   Liver Panel Recent Labs    10/29/24 1225 10/30/24 1000  PROT 7.1 5.8*  ALBUMIN 3.1* 2.5*  AST 48* 51*  ALT 55* 50*  ALKPHOS 98 103  BILITOT 1.8* 1.2   Sedimentation Rate Recent Labs    10/30/24 1000  ESRSEDRATE 32*   C-Reactive Protein Recent Labs    10/30/24 1000  CRP 27.6*    Microbiology:  Studies/Results: DG FL ASP/INJ MAJOR (SHOULDER, HIP, KNEE) Addendum Date: 10/30/2024 ADDENDUM  REPORT: 10/30/2024 12:37 ADDENDUM: CLINICAL DATA: 73 year old male with right hip pain, concerning for septic arthritis. IR requested for diagnostic right hip intra-articular aspiration. Electronically Signed   By: Ester Sides M.D.   On: 10/30/2024 12:37   Result Date: 10/30/2024 CLINICAL DATA:  73 year old male with right hip pain, concerning for septic arthritis. IR requested for diagnostic right hip intra-articular aspiration. EXAM: RIGHT HIP INJECTION AND ASPIRATION UNDER FLUOROSCOPY COMPARISON:  MR Hip Right w w/o cm on 10/29/2024. FLUOROSCOPY: Radiation Exposure Index (as provided by the fluoroscopic device): 7.0 mGy Kerma PROCEDURE: Overlying skin prepped with Betadine, draped in the usual sterile fashion, and infiltrated locally with buffered Lidocaine . Straight 20 gauge spinal needle advanced to the superolateral margin of the right femoral head, with immediate return of turbid, straw color fluid in needle hub, which was aspirated. Diagnostic injection of iodinated contrast demonstrates intra-articular spread without intravascular component. Subsequent aspiration of fluid was attempted post contrast injection, with total of 1 mL of fluid aspirated. Needle was then removed.  No immediate complication. IMPRESSION: Technically successful right hip injection and aspiration under fluoroscopy. Procedure performed by: Carlin Griffon, PA-C. Electronically Signed: By: Ester Sides M.D. On: 10/30/2024  12:34   MR HIP RIGHT W WO CONTRAST Result Date: 10/30/2024 CLINICAL DATA:  Chronic right hip pain and groin pain EXAM: MRI OF THE RIGHT HIP WITHOUT AND WITH CONTRAST TECHNIQUE: Multiplanar, multisequence MR imaging was performed both before and after administration of intravenous contrast. CONTRAST:  9mL GADAVIST  GADOBUTROL  1 MMOL/ML IV SOLN COMPARISON:  Hip x-ray 10/26/2024 FINDINGS: Bones: No hip fracture, dislocation or avascular necrosis. No periosteal reaction or bone destruction. No aggressive osseous  lesion. Normal sacrum and sacroiliac joints. No SI joint widening or erosive changes. Lower lumbar spine spondylosis. Articular cartilage and labrum Articular cartilage: High-grade partial is cartilage loss of the right anterosuperior femoral head and acetabulum. Labrum:  Nondisplaced right anterosuperior labral tear. Joint or bursal effusion Joint effusion: Large right hip joint effusion. No left hip joint effusion. No SI joint effusion. Bursae:  No bursal fluid. Muscles and tendons Flexors: Normal. Extensors: Normal. Abductors: Normal. Adductors: Normal. Gluteals: Severe muscle edema in the right gluteus minimus muscle. Mild muscle edema in the abductor musculature and perifascial edema. Small fluid collection in the right gluteus minimus muscle measuring 5.9 x 0.9 x 3.5 cm. Hamstrings: Normal. Other findings No pelvic free fluid. No fluid collection or hematoma. No inguinal lymphadenopathy. No inguinal hernia. IMPRESSION: 1. Large right hip joint effusion with severe muscle edema in the right gluteus minimus muscle and mild muscle edema in the abductor musculature and perifascial edema. Small abscess in the right gluteus minimus muscle measuring 5.9 x 0.9 x 3.5 cm. Findings most concerning for septic arthritis and myositis. 2. High-grade partial is cartilage loss of the right anterosuperior femoral head and acetabulum. 3. Nondisplaced right anterosuperior labral tear. Electronically Signed   By: Julaine Blanch M.D.   On: 10/30/2024 08:15   CT ABDOMEN PELVIS W CONTRAST Result Date: 10/29/2024 EXAM: CT ABDOMEN AND PELVIS WITH CONTRAST 10/29/2024 05:52:43 PM TECHNIQUE: CT of the abdomen and pelvis was performed with the administration of 75 mL of iohexol  (OMNIPAQUE ) 350 MG/ML injection. Multiplanar reformatted images are provided for review. Automated exposure control, iterative reconstruction, and/or weight-based adjustment of the mA/kV was utilized to reduce the radiation dose to as low as reasonably achievable.  COMPARISON: 10/01/2019 CLINICAL HISTORY: Sepsis FINDINGS: LOWER CHEST: There is atelectasis in the lung bases. LIVER: The liver is unremarkable. GALLBLADDER AND BILE DUCTS: Gallbladder surgically absent. No biliary ductal dilatation. SPLEEN: No acute abnormality. PANCREAS: No acute abnormality. ADRENAL GLANDS: No acute abnormality. KIDNEYS, URETERS AND BLADDER: There is a punctate calculus in the inferior pole of the left kidney. There is mild nonspecific bilateral perinephric vascular stranding. There is some scarring in the lateral left kidney. No hydronephrosis. Urinary bladder is unremarkable. GI AND BOWEL: Stomach demonstrates no acute abnormality. There is diffuse colonic diverticulosis. The appendix appears normal. There is no bowel obstruction. PERITONEUM AND RETROPERITONEUM: No ascites. No free air. VASCULATURE: Aorta is normal in caliber. There are atherosclerotic calcifications of the aorta. LYMPH NODES: No lymphadenopathy. REPRODUCTIVE ORGANS: No acute abnormality. BONES AND SOFT TISSUES: Degenerative changes affect the spine. There is no focal osseous lesion. There is a very small fat containing left inguinal hernia seen best on coronal view. IMPRESSION: 1. No acute findings in the abdomen or pelvis. 2. Cholecystectomy without complication. 3. Diffuse colonic diverticulosis without evidence of acute diverticulitis. 4. Aortic atherosclerosis without aneurysm. 5. Nonobstructing punctate left inferior pole renal calculus. 6. Bilateral mild perinephric stranding and left renal cortical scarring, likely chronic. Electronically signed by: Greig Pique MD 10/29/2024 06:52 PM EST RP Workstation: [id number]  Assessment/Plan: R hip Septic Arthritis Bacteremia S aureus Myositis Dehydration Constipation Hepatitis, mild Decubitus R hip (per wife)  For wash out of hip today.  Continue ancef  Repeat BCx sent this am. TTE pending Pic when bacteremia cleared, repeat BCx (-)  [facility] Teaching Service/Infectious Disease Pager: [phone]  10/31/2024, 9:19 AM

## 2024-10-31 NOTE — Anesthesia Postprocedure Evaluation (Signed)
 Anesthesia Post Note  Patient: Duane Greene  Procedure(s) Performed: IRRIGATION AND DEBRIDEMENT WOUND (Right: Hip)     Patient location during evaluation: PACU Anesthesia Type: General Level of consciousness: awake and alert Pain management: pain level controlled Vital Signs Assessment: post-procedure vital signs reviewed and stable Respiratory status: spontaneous breathing, nonlabored ventilation and respiratory function stable Cardiovascular status: blood pressure returned to baseline and stable Postop Assessment: no apparent nausea or vomiting Anesthetic complications: no   No notable events documented.  Last Vitals:  Vitals:   10/31/24 1945 10/31/24 1959  BP: (!) 127/58 117/62  Pulse: 76 65  Resp: 15 14  Temp: 36.7 C (!) 36.3 C  SpO2: 92% 99%    Last Pain:  Vitals:   10/31/24 2005  TempSrc:   PainSc: 7                  Larrissa Stivers,W. EDMOND

## 2024-10-31 NOTE — Progress Notes (Signed)
 Orthopedic Surgery Progress Note   Assessment: Patient is a 73 y.o. male with right hip septic arthritis, right gluteus musculature myositis    Plan: -Planning for incision and drainage of the right hip today -Risks of surgery covered again with patient -Consent verified, site marked  -Diet: NPO for procedure -DVT ppx: aspirin  81mg  BID -Antibiotics: per primary -Weight bearing status: as tolerated  -PT evaluate and treat -Pain control -Dispo: pending completion of operative plans  ___________________________________________________________________________  Subjective: No acute events overnight. Still having right hip pain. Remaining questions about surgery answered. Understands plan.    Physical Exam:  General: no acute distress, appears stated age Neurologic: alert, answering questions appropriately, following commands Respiratory: unlabored breathing on room air, symmetric chest rise Psychiatric: appropriate affect, normal cadence to speech  MSK:    -Right lower extremity  Positive stinchfield, pain with log roll, significant pain through passive range of motion EHL/TA/GSC intact Plantarflexes and dorsiflexes toes Sensation intact to light touch in sural, saphenous, tibial, deep peroneal, and superficial peroneal nerve distributions Foot warm and well perfused   Yesterday's total administered Morphine  Milligram Equivalents: 19.5   Patient name: Duane Greene Patient MRN: [id number] Date: 10/31/2024

## 2024-10-31 NOTE — Progress Notes (Signed)
 PROGRESS NOTE  Duane Greene FMW:[id number] DOB: [birth year] DOA: 10/29/2024 PCP: [name] LABOR, MD   LOS: 1 day   Brief narrative:  Duane Greene is a 73 y.o. male with past medical history significant of HTN, hyperlipidemia, obstructive sleep apnea on CPAP, and OA bilateral hips s/p US  guided steroid injection on 10/19/24 presented to hospital with right hip joint swelling since injection with decreased mobility.  Patient then presented to [facility] ED on 12/ 9/25 where an x-ray was done which was consistent with osteoarthritis so was given medications to go home with.  Subsequently, patient also saw orthopedics in outpatient MRI was ordered.  Patient then went home but then despite taking medications for pain it was unbearable so presented to the ED.   In the ED, patient was febrile with temperature of 100.4 deg F x1. Labs with notable for WBC 12.9-->12.3, lactic acid 2-->1.1, and CRP 27.6.  Urinalysis showed are bacteruria and WBC 0-5. Blood cultures were sent from the ED was consistent with S. aureus. ED provider consulted orthopedic surgery, ID, and requested medicine for admission.   Assessment/Plan: Principal Problem:   Septic arthritis (HCC)  Right hip septic arthritis Gram-positive cocci bacteremia Infectious disease on board and currently on IV cefazolin .  Orthopedics on board and underwent right hip aspiration on 10/30/2024.  Plan is I&D of the hip joint for septic arthritis.  Will follow blood and right hip cultures.  Continue pain management with Tylenol  Robaxin  gabapentin  and oxycodone .  Plan for TTE given bacteremia.  ID on board.  Follow recommendations.  Essential hypertension Patient is on losartan -hydrochlorothiazide  50-12.5mg  daily at home.  Will continue to monitor blood pressure.   OSA Continue CPAP at nighttime.   DVT prophylaxis: enoxaparin  (LOVENOX ) injection 40 mg Start: 10/30/24 0930 SCDs Start: 10/30/24 9083   Disposition: Likely home with home  health.  Uncertain at this time  Status is: Inpatient Remains inpatient appropriate because: Pending clinical improvement, plan for surgical intervention    Code Status:     Code Status: Limited: Do not attempt resuscitation (DNR) -DNR-LIMITED -Do Not Intubate/DNI   Family Communication: Spoke with the patient's spouse at bedside.  Consultants: Orthopedics Infectious disease  Procedures: Right hip aspiration 10/30/2024  Anti-infectives:  Cefazolin   Anti-infectives (From admission, onward)    Start     Dose/Rate Route Frequency Ordered Stop   11/01/24 1000  terbinafine  (LAMISIL ) tablet 250 mg        250 mg Oral Every [facility] 10/30/24 0811     10/30/24 2300  vancomycin  (VANCOCIN ) IVPB 1000 mg/200 mL premix  Status:  Discontinued        1,000 mg 200 mL/hr over 60 Minutes Intravenous Every 12 hours 10/30/24 0959 10/30/24 1002   10/30/24 1000  vancomycin  (VANCOREADY) IVPB 1750 mg/350 mL  Status:  Discontinued        1,750 mg 175 mL/hr over 120 Minutes Intravenous  Once 10/30/24 0952 10/30/24 1002   10/30/24 1000  ceFAZolin  (ANCEF ) IVPB 2g/100 mL premix        2 g 200 mL/hr over 30 Minutes Intravenous Every 8 hours 10/30/24 0957     10/29/24 2245  terbinafine  (LAMISIL ) tablet 250 mg  Status:  Discontinued        250 mg Oral Every 7 days 10/29/24 2235 10/30/24 0810        Subjective: Today, patient was seen and examined at bedside.  Patient complains of pain over the hip especially on movement.  Denies any fever chills and  nausea vomiting or shortness of breath.  Objective: Vitals:   10/31/24 0430 10/31/24 0730  BP: (!) 102/50 (!) 108/59  Pulse: 71 66  Resp: 18 18  Temp: 98.8 F (37.1 C) 98 F (36.7 C)  SpO2: 97% 97%    Intake/Output Summary (Last 24 hours) at 10/31/2024 1116 Last data filed at 10/31/2024 0926 Gross per 24 hour  Intake 590.28 ml  Output 125 ml  Net 465.28 ml   There were no vitals filed for this visit. There is no height or weight on file to  calculate BMI.   Physical Exam: GENERAL: Patient is alert awake and oriented. Not in obvious distress. HENT: No scleral pallor or icterus. Pupils equally reactive to light. Oral mucosa is moist NECK: is supple, no gross swelling noted. CHEST: Clear to auscultation. No crackles or wheezes.   CVS: S1 and S2 heard, no murmur. Regular rate and rhythm.  ABDOMEN: Soft, non-tender, bowel sounds are present. EXTREMITIES: No edema.  Tenderness of the right hip on movement. CNS: Cranial nerves are intact. No focal motor deficits. SKIN: warm and dry without rashes.  Data Review: I have personally reviewed the following laboratory data and studies,  CBC: Recent Labs  Lab 10/26/24 0657 10/29/24 1225 10/30/24 1000 10/31/24 0013  WBC 9.0 12.9* 12.3* 14.4*  NEUTROABS 7.0 10.0* 9.6*  --   HGB 14.8 15.3 14.4 13.8  HCT 42.0 44.4 40.5 38.9*  MCV 93.3 94.3 92.9 91.5  PLT 147* 124* 106* 133*   Basic Metabolic Panel: Recent Labs  Lab 10/26/24 0657 10/29/24 1225 10/30/24 1000 10/31/24 0013  NA 136 132* 134* 135  K 4.1 3.5 3.6 3.5  CL 98 93* 100 99  CO2 28 27 26 25   GLUCOSE 124* 114* 165* 184*  BUN 21 20 22  25*  CREATININE 1.08 1.01 0.88  0.87 0.88  CALCIUM 9.5 8.3* 7.9* 8.3*   Liver Function Tests: Recent Labs  Lab 10/29/24 1225 10/30/24 1000  AST 48* 51*  ALT 55* 50*  ALKPHOS 98 103  BILITOT 1.8* 1.2  PROT 7.1 5.8*  ALBUMIN 3.1* 2.5*   No results for input(s): LIPASE, AMYLASE in the last 168 hours. No results for input(s): AMMONIA in the last 168 hours. Cardiac Enzymes: Recent Labs  Lab 10/30/24 1000  CKTOTAL 148   BNP (last 3 results) No results for input(s): BNP in the last 8760 hours.  ProBNP (last 3 results) No results for input(s): PROBNP in the last 8760 hours.  CBG: No results for input(s): GLUCAP in the last 168 hours. Recent Results (from the past 240 hours)  Blood Culture (routine x 2)     Status: Abnormal (Preliminary result)   Collection  Time: 10/29/24  5:09 PM   Specimen: BLOOD LEFT ARM  Result Value Ref Range Status   Specimen Description BLOOD LEFT ARM  Final   Special Requests   Final    BOTTLES DRAWN AEROBIC AND ANAEROBIC Blood Culture adequate volume   Culture  Setup Time   Final    GRAM POSITIVE COCCI IN BOTH AEROBIC AND ANAEROBIC BOTTLES CRITICAL RESULT CALLED TO, READ BACK BY AND VERIFIED WITH: PHARMD M. MITCHELL 1212 AT 0955, ADC    Culture (A)  Final    STAPHYLOCOCCUS AUREUS SUSCEPTIBILITIES TO FOLLOW Performed at [facility], [address] [place], [place] [address]    Report Status PENDING  Incomplete  Blood Culture ID Panel (Reflexed)     Status: Abnormal   Collection Time: 10/29/24  5:09 PM  Result Value Ref Range Status   Enterococcus faecalis NOT DETECTED NOT DETECTED Final   Enterococcus Faecium NOT DETECTED NOT DETECTED Final   Listeria monocytogenes NOT DETECTED NOT DETECTED Final   Staphylococcus species DETECTED (A) NOT DETECTED Final    Comment: CRITICAL RESULT CALLED TO, READ BACK BY AND VERIFIED WITH: PHARMD M. MITCHELL 1212 AT 0955, ADC    Staphylococcus aureus (BCID) DETECTED (A) NOT DETECTED Final    Comment: CRITICAL RESULT CALLED TO, READ BACK BY AND VERIFIED WITH: PHARMD M. MITCHELL 1212 AT 0955, ADC    Staphylococcus epidermidis NOT DETECTED NOT DETECTED Final   Staphylococcus lugdunensis NOT DETECTED NOT DETECTED Final   Streptococcus species NOT DETECTED NOT DETECTED Final   Streptococcus agalactiae NOT DETECTED NOT DETECTED Final   Streptococcus pneumoniae NOT DETECTED NOT DETECTED Final   Streptococcus pyogenes NOT DETECTED NOT DETECTED Final   A.calcoaceticus-baumannii NOT DETECTED NOT DETECTED Final   Bacteroides fragilis NOT DETECTED NOT DETECTED Final   Enterobacterales NOT DETECTED NOT DETECTED Final   Enterobacter cloacae complex NOT DETECTED NOT DETECTED Final   Escherichia coli NOT DETECTED NOT DETECTED Final   Klebsiella aerogenes NOT DETECTED NOT  DETECTED Final   Klebsiella oxytoca NOT DETECTED NOT DETECTED Final   Klebsiella pneumoniae NOT DETECTED NOT DETECTED Final   Proteus species NOT DETECTED NOT DETECTED Final   Salmonella species NOT DETECTED NOT DETECTED Final   Serratia marcescens NOT DETECTED NOT DETECTED Final   Haemophilus influenzae NOT DETECTED NOT DETECTED Final   Neisseria meningitidis NOT DETECTED NOT DETECTED Final   Pseudomonas aeruginosa NOT DETECTED NOT DETECTED Final   Stenotrophomonas maltophilia NOT DETECTED NOT DETECTED Final   Candida albicans NOT DETECTED NOT DETECTED Final   Candida auris NOT DETECTED NOT DETECTED Final   Candida glabrata NOT DETECTED NOT DETECTED Final   Candida krusei NOT DETECTED NOT DETECTED Final   Candida parapsilosis NOT DETECTED NOT DETECTED Final   Candida tropicalis NOT DETECTED NOT DETECTED Final   Cryptococcus neoformans/gattii NOT DETECTED NOT DETECTED Final   Meth resistant mecA/C and MREJ NOT DETECTED NOT DETECTED Final    Comment: Performed at [facility], [address] [place], [place] [address]  Blood Culture (routine x 2)     Status: Abnormal (Preliminary result)   Collection Time: 10/29/24  5:14 PM   Specimen: BLOOD RIGHT ARM  Result Value Ref Range Status   Specimen Description BLOOD RIGHT ARM  Final   Special Requests   Final    BOTTLES DRAWN AEROBIC AND ANAEROBIC Blood Culture adequate volume   Culture  Setup Time   Final    GRAM POSITIVE COCCI AEROBIC BOTTLE ONLY CRITICAL VALUE NOTED.  VALUE IS CONSISTENT WITH PREVIOUSLY REPORTED AND CALLED VALUE. Performed at [facility], [address], [place], [place] [address]    Culture STAPHYLOCOCCUS AUREUS (A)  Final   Report Status PENDING  Incomplete  Resp panel by RT-PCR (RSV, Flu A&B, Covid) Anterior Nasal Swab     Status: None   Collection Time: 10/29/24  5:33 PM   Specimen: Anterior Nasal Swab  Result Value Ref Range Status   SARS Coronavirus 2 by RT PCR NEGATIVE NEGATIVE Final    Influenza A by PCR NEGATIVE NEGATIVE Final   Influenza B by PCR NEGATIVE NEGATIVE Final    Comment: (NOTE) The Xpert Xpress SARS-CoV-2/FLU/RSV plus assay is intended as an aid in the diagnosis of influenza from Nasopharyngeal swab specimens and should not be used as a sole basis  for treatment. Nasal washings and aspirates are unacceptable for Xpert Xpress SARS-CoV-2/FLU/RSV testing.  Fact Sheet for Patients: bloggercourse.com  Fact Sheet for Healthcare Providers: seriousbroker.it  This test is not yet approved or cleared by the [place]  FDA and has been authorized for detection and/or diagnosis of SARS-CoV-2 by FDA under an Emergency Use Authorization (EUA). This EUA will remain in effect (meaning this test can be used) for the duration of the COVID-19 declaration under Section 564(b)(1) of the Act, 21 U.S.C. section 360bbb-3(b)(1), unless the authorization is terminated or revoked.     Resp Syncytial Virus by PCR NEGATIVE NEGATIVE Final    Comment: (NOTE) Fact Sheet for Patients: bloggercourse.com  Fact Sheet for Healthcare Providers: seriousbroker.it  This test is not yet approved or cleared by the [place]  FDA and has been authorized for detection and/or diagnosis of SARS-CoV-2 by FDA under an Emergency Use Authorization (EUA). This EUA will remain in effect (meaning this test can be used) for the duration of the COVID-19 declaration under Section 564(b)(1) of the Act, 21 U.S.C. section 360bbb-3(b)(1), unless the authorization is terminated or revoked.  Performed at [facility], [address] [place], [place] [address]   Culture, blood (Routine X 2) w Reflex to ID Panel     Status: None (Preliminary result)   Collection Time: 10/31/24 12:15 AM   Specimen: BLOOD RIGHT HAND  Result Value Ref Range Status   Specimen Description BLOOD RIGHT HAND  Final    Special Requests   Final    BOTTLES DRAWN AEROBIC AND ANAEROBIC Blood Culture adequate volume   Culture   Final    NO GROWTH < 12 HOURS Performed at [facility], [address] [place], [place] [address]    Report Status PENDING  Incomplete  Culture, blood (Routine X 2) w Reflex to ID Panel     Status: None (Preliminary result)   Collection Time: 10/31/24 12:26 AM   Specimen: BLOOD  Result Value Ref Range Status   Specimen Description BLOOD BLOOD RIGHT ARM  Final   Special Requests   Final    BOTTLES DRAWN AEROBIC AND ANAEROBIC Blood Culture adequate volume   Culture   Final    NO GROWTH < 12 HOURS Performed at [facility], [address], [place], [place] [address]    Report Status PENDING  Incomplete     Studies: DG FL ASP/INJ MAJOR (SHOULDER, HIP, KNEE) Addendum Date: 10/30/2024 ADDENDUM REPORT: 10/30/2024 12:37 ADDENDUM: CLINICAL DATA: 73 year old male with right hip pain, concerning for septic arthritis. IR requested for diagnostic right hip intra-articular aspiration. Electronically Signed   By: Ester Sides M.D.   On: 10/30/2024 12:37   Result Date: 10/30/2024 CLINICAL DATA:  73 year old male with right hip pain, concerning for septic arthritis. IR requested for diagnostic right hip intra-articular aspiration. EXAM: RIGHT HIP INJECTION AND ASPIRATION UNDER FLUOROSCOPY COMPARISON:  MR Hip Right w w/o cm on 10/29/2024. FLUOROSCOPY: Radiation Exposure Index (as provided by the fluoroscopic device): 7.0 mGy Kerma PROCEDURE: Overlying skin prepped with Betadine, draped in the usual sterile fashion, and infiltrated locally with buffered Lidocaine . Straight 20 gauge spinal needle advanced to the superolateral margin of the right femoral head, with immediate return of turbid, straw color fluid in needle hub, which was aspirated. Diagnostic injection of iodinated contrast demonstrates intra-articular spread without intravascular component. Subsequent aspiration of fluid was  attempted post contrast injection, with total of 1 mL of fluid aspirated. Needle was then removed.  No immediate complication. IMPRESSION: Technically successful right hip injection and aspiration under fluoroscopy. Procedure performed by: Carlin Griffon, PA-C. Electronically Signed: By: Ester Sides M.D. On: 10/30/2024 12:34   MR HIP RIGHT W WO CONTRAST Result Date: 10/30/2024 CLINICAL DATA:  Chronic right hip pain and groin pain EXAM: MRI OF THE RIGHT HIP WITHOUT AND WITH CONTRAST TECHNIQUE: Multiplanar, multisequence MR imaging was performed both before and after administration of intravenous contrast. CONTRAST:  9mL GADAVIST  GADOBUTROL  1 MMOL/ML IV SOLN COMPARISON:  Hip x-ray 10/26/2024 FINDINGS: Bones: No hip fracture, dislocation or avascular necrosis. No periosteal reaction or bone destruction. No aggressive osseous lesion. Normal sacrum and sacroiliac joints. No SI joint widening or erosive changes. Lower lumbar spine spondylosis. Articular cartilage and labrum Articular cartilage: High-grade partial is cartilage loss of the right anterosuperior femoral head and acetabulum. Labrum:  Nondisplaced right anterosuperior labral tear. Joint or bursal effusion Joint effusion: Large right hip joint effusion. No left hip joint effusion. No SI joint effusion. Bursae:  No bursal fluid. Muscles and tendons Flexors: Normal. Extensors: Normal. Abductors: Normal. Adductors: Normal. Gluteals: Severe muscle edema in the right gluteus minimus muscle. Mild muscle edema in the abductor musculature and perifascial edema. Small fluid collection in the right gluteus minimus muscle measuring 5.9 x 0.9 x 3.5 cm. Hamstrings: Normal. Other findings No pelvic free fluid. No fluid collection or hematoma. No inguinal lymphadenopathy. No inguinal hernia. IMPRESSION: 1. Large right hip joint effusion with severe muscle edema in the right gluteus minimus muscle and mild muscle edema in the abductor musculature and perifascial edema.  Small abscess in the right gluteus minimus muscle measuring 5.9 x 0.9 x 3.5 cm. Findings most concerning for septic arthritis and myositis. 2. High-grade partial is cartilage loss of the right anterosuperior femoral head and acetabulum. 3. Nondisplaced right anterosuperior labral tear. Electronically Signed   By: Julaine Blanch M.D.   On: 10/30/2024 08:15   CT ABDOMEN PELVIS W CONTRAST Result Date: 10/29/2024 EXAM: CT ABDOMEN AND PELVIS WITH CONTRAST 10/29/2024 05:52:43 PM TECHNIQUE: CT of the abdomen and pelvis was performed with the administration of 75 mL of iohexol  (OMNIPAQUE ) 350 MG/ML injection. Multiplanar reformatted images are provided for review. Automated exposure control, iterative reconstruction, and/or weight-based adjustment of the mA/kV was utilized to reduce the radiation dose to as low as reasonably achievable. COMPARISON: 10/01/2019 CLINICAL HISTORY: Sepsis FINDINGS: LOWER CHEST: There is atelectasis in the lung bases. LIVER: The liver is unremarkable. GALLBLADDER AND BILE DUCTS: Gallbladder surgically absent. No biliary ductal dilatation. SPLEEN: No acute abnormality. PANCREAS: No acute abnormality. ADRENAL GLANDS: No acute abnormality. KIDNEYS, URETERS AND BLADDER: There is a punctate calculus in the inferior pole of the left kidney. There is mild nonspecific bilateral perinephric vascular stranding. There is some scarring in the lateral left kidney. No hydronephrosis. Urinary bladder is unremarkable. GI AND BOWEL: Stomach demonstrates no acute abnormality. There is diffuse colonic diverticulosis. The appendix appears normal. There is no bowel obstruction. PERITONEUM AND RETROPERITONEUM: No ascites. No free air. VASCULATURE: Aorta is normal in caliber. There are atherosclerotic calcifications of the aorta. LYMPH NODES: No lymphadenopathy. REPRODUCTIVE ORGANS: No acute abnormality. BONES AND SOFT TISSUES: Degenerative changes affect the spine. There is no focal osseous lesion. There is a very  small fat containing left inguinal hernia seen best on coronal view. IMPRESSION: 1. No acute findings in the abdomen or pelvis. 2. Cholecystectomy without complication. 3. Diffuse colonic diverticulosis without evidence of acute diverticulitis. 4. Aortic atherosclerosis without aneurysm. 5. Nonobstructing punctate left inferior pole  renal calculus. 6. Bilateral mild perinephric stranding and left renal cortical scarring, likely chronic. Electronically signed by: Greig Pique MD 10/29/2024 06:52 PM EST RP Workstation: [id number]      Vernal Alstrom, MD  [facility] 10/31/2024  If 7PM-7AM, please contact night-coverage

## 2024-10-31 NOTE — Progress Notes (Signed)
.. ° ° °  PROCEDURAL EXPEDITER PROGRESS NOTE  Patient Name: Duane Greene  DOB:[birth year] Date of Admission: 10/29/2024  Date of Assessment:10/31/2024   -------------------------------------------------------------------------------------------------------------------   Brief clinical summary: Pt to OR today for I&D of right hip  Orders in place:  No - Consent to be done at bedside  Communication with surgical team if no orders: N/A  Labs, test, and orders reviewed: Y  Requires surgical clearance:  No  Barriers noted: N/A   Intervention provided by [facility] team: N/A  Barrier resolved: not applicable   -------------------------------------------------------------------------------------------------------------------  [employer], [place], [place] Please contact us  directly via secure chat (search for [facility]) or by calling us  at [phone] [facility]).

## 2024-10-31 NOTE — Anesthesia Preprocedure Evaluation (Addendum)
 Anesthesia Evaluation  Patient identified by MRN, date of birth, ID band Patient awake    Reviewed: Allergy & Precautions, H&P , NPO status , Patient's Chart, lab work & pertinent test results  Airway Mallampati: II  TM Distance: >3 FB Neck ROM: Full    Dental no notable dental hx. (+) Teeth Intact, Dental Advisory Given   Pulmonary sleep apnea and Continuous Positive Airway Pressure Ventilation    Pulmonary exam normal breath sounds clear to auscultation       Cardiovascular hypertension, Pt. on medications  Rhythm:Regular Rate:Normal     Neuro/Psych negative neurological ROS  negative psych ROS   GI/Hepatic negative GI ROS, Neg liver ROS,,,  Endo/Other  negative endocrine ROS    Renal/GU negative Renal ROS  negative genitourinary   Musculoskeletal   Abdominal   Peds  Hematology negative hematology ROS (+)   Anesthesia Other Findings   Reproductive/Obstetrics negative OB ROS                              Anesthesia Physical Anesthesia Plan  ASA: 3  Anesthesia Plan: General   Post-op Pain Management:    Induction: Intravenous  PONV Risk Score and Plan: 3 and Ondansetron , Dexamethasone  and Treatment may vary due to age or medical condition  Airway Management Planned: Oral ETT  Additional Equipment:   Intra-op Plan:   Post-operative Plan: Extubation in OR  Informed Consent: I have reviewed the patients History and Physical, chart, labs and discussed the procedure including the risks, benefits and alternatives for the proposed anesthesia with the patient or authorized representative who has indicated his/her understanding and acceptance.   Patient has DNR.  Discussed DNR with patient and Suspend DNR.   Dental advisory given  Plan Discussed with: CRNA  Anesthesia Plan Comments:          Anesthesia Quick Evaluation

## 2024-10-31 NOTE — Transfer of Care (Signed)
 Immediate Anesthesia Transfer of Care Note  Patient: Duane Greene  Procedure(s) Performed: IRRIGATION AND DEBRIDEMENT WOUND (Right: Hip)  Patient Location: PACU  Anesthesia Type:General  Level of Consciousness: awake and alert   Airway & Oxygen Therapy: Patient Spontanous Breathing  Post-op Assessment: Report given to RN and Post -op Vital signs reviewed and stable  Post vital signs: Reviewed and stable  Last Vitals:  Vitals Value Taken Time  BP 118/64 10/31/24 18:30  Temp    Pulse 68 10/31/24 18:33  Resp 22 10/31/24 18:33  SpO2 97 % 10/31/24 18:33  Vitals shown include unfiled device data.  Last Pain:  Vitals:   10/31/24 1610  TempSrc: Oral  PainSc: 0-No pain      Patients Stated Pain Goal: 4 (10/30/24 1950)  Complications: No notable events documented.

## 2024-10-31 NOTE — Progress Notes (Signed)
 OT Cancellation Note  Patient Details Name: Delwin L Delagarza MRN: [id number] DOB: [birth year]   Cancelled Treatment:    Reason Eval/Treat Not Completed:  (pt is pending procedure and will check back post for optimal time to start OT eval)  Ely Molt 10/31/2024, 6:46 AM

## 2024-10-31 NOTE — Anesthesia Procedure Notes (Signed)
 Procedure Name: Intubation Date/Time: 10/31/2024 5:06 PM  Performed by: Celia Alan HERO, CRNAPre-anesthesia Checklist: Emergency Drugs available, Patient identified, Suction available, Patient being monitored and Timeout performed Patient Re-evaluated:Patient Re-evaluated prior to induction Oxygen Delivery Method: Circle system utilized Preoxygenation: Pre-oxygenation with 100% oxygen Induction Type: IV induction Ventilation: Mask ventilation without difficulty Laryngoscope Size: Glidescope and 3 Grade View: Grade I Tube type: Oral Tube size: 7.5 mm Number of attempts: 1 Airway Equipment and Method: Stylet and Video-laryngoscopy Placement Confirmation: ETT inserted through vocal cords under direct vision, positive ETCO2 and breath sounds checked- equal and bilateral Secured at: 23 cm Tube secured with: Tape Dental Injury: Teeth and Oropharynx as per pre-operative assessment  Comments: Elective glidescope- intubated on bed for patient comfort

## 2024-10-31 NOTE — Brief Op Note (Signed)
 10/31/2024  6:38 PM  PATIENT:  Duane Greene  73 y.o. male  PRE-OPERATIVE DIAGNOSIS:  RIGHT HIP SEPTIC ARTHRITIS  POST-OPERATIVE DIAGNOSIS:  RIGHT HIP SEPTIC ARTHRITIS  PROCEDURE:  Procedures: IRRIGATION AND DEBRIDEMENT WOUND (Right)  SURGEON:  Surgeons and Role:    DEWAINE Georgina Ozell DELENA, MD - Primary  PHYSICIAN ASSISTANT: none   ASSISTANTS: none   ANESTHESIA:   general  EBL:  20 mL   BLOOD ADMINISTERED:none  DRAINS: none   LOCAL MEDICATIONS USED:  NONE  SPECIMEN:  Yes, aspirate of synovial fluid form the right hip  DISPOSITION OF SPECIMEN:  microbiology  COUNTS:  YES  TOURNIQUET: NONE  DICTATION: .Note written in EPIC  PLAN OF CARE: Admit to inpatient   PATIENT DISPOSITION:  PACU - hemodynamically stable.   Delay start of Pharmacological VTE agent (>24hrs) due to surgical blood loss or risk of bleeding: no

## 2024-11-01 ENCOUNTER — Encounter (HOSPITAL_COMMUNITY): Payer: Self-pay | Admitting: Orthopedic Surgery

## 2024-11-01 ENCOUNTER — Inpatient Hospital Stay (HOSPITAL_COMMUNITY)

## 2024-11-01 DIAGNOSIS — R7881 Bacteremia: Secondary | ICD-10-CM

## 2024-11-01 LAB — CBC WITH DIFFERENTIAL/PLATELET
Abs Immature Granulocytes: 0.14 K/uL — ABNORMAL HIGH (ref 0.00–0.07)
Basophils Absolute: 0 K/uL (ref 0.0–0.1)
Basophils Relative: 0 %
Eosinophils Absolute: 0 K/uL (ref 0.0–0.5)
Eosinophils Relative: 0 %
HCT: 37.8 % — ABNORMAL LOW (ref 39.0–52.0)
Hemoglobin: 13.2 g/dL (ref 13.0–17.0)
Immature Granulocytes: 1 %
Lymphocytes Relative: 4 %
Lymphs Abs: 0.6 K/uL — ABNORMAL LOW (ref 0.7–4.0)
MCH: 32.3 pg (ref 26.0–34.0)
MCHC: 34.9 g/dL (ref 30.0–36.0)
MCV: 92.4 fL (ref 80.0–100.0)
Monocytes Absolute: 1 K/uL (ref 0.1–1.0)
Monocytes Relative: 7 %
Neutro Abs: 13.1 K/uL — ABNORMAL HIGH (ref 1.7–7.7)
Neutrophils Relative %: 88 %
Platelets: 165 K/uL (ref 150–400)
RBC: 4.09 MIL/uL — ABNORMAL LOW (ref 4.22–5.81)
RDW: 13.2 % (ref 11.5–15.5)
WBC: 14.9 K/uL — ABNORMAL HIGH (ref 4.0–10.5)
nRBC: 0 % (ref 0.0–0.2)

## 2024-11-01 LAB — CULTURE, BLOOD (ROUTINE X 2)
Special Requests: ADEQUATE
Special Requests: ADEQUATE

## 2024-11-01 LAB — ECHOCARDIOGRAM COMPLETE
AR max vel: 2.91 cm2
AV Area VTI: 3.03 cm2
AV Area mean vel: 3.16 cm2
AV Mean grad: 3 mmHg
AV Peak grad: 7 mmHg
Ao pk vel: 1.32 m/s
Area-P 1/2: 3.34 cm2
Calc EF: 59.5 %
S' Lateral: 3.4 cm
Single Plane A2C EF: 63.6 %
Single Plane A4C EF: 57 %

## 2024-11-01 NOTE — Progress Notes (Signed)
 Physical Therapy Treatment Patient Details Name: Duane Greene MRN: [id number] DOB: 1951/03/13 Today's Date: 11/01/2024   History of Present Illness Duane Greene is a 73 y.o. male who p/w R hip joint swelling and imaging/arthrocentesis c/f R hip septic arthritis. Past medical history significant of HTN, HLD, OSA (on CPAP), and OA b/l hips s/p US  guided steroid injection on 12/2.    PT Comments  Pt progressing towards his physical therapy goals. He was received sitting up in the chair. Utilized Stedy (lift equipment) to transition up to standing with BLE weightbearing. Pt able to sustain static standing for posterior peri care performed by PT. Transferred back to edge of bed, where he worked on serial sit to stands to [employer] with emphasis on anterior weight shift. Pt requiring moderate assist. Suspect will be able to progress to gait trials tomorrow. As he was completely independent prior to admission and has good activity tolerance throughout session, patient will benefit from intensive inpatient follow-up therapy, >3 hours/day in order to address deficits, maximize functional mobility and decrease caregiver burden. Pt has excellent family support.     If plan is discharge home, recommend the following: A lot of help with walking and/or transfers;A lot of help with bathing/dressing/bathroom   Can travel by private vehicle        Equipment Recommendations  Wheelchair (measurements PT);Wheelchair cushion (measurements PT);Hospital bed    Recommendations for Other Services       Precautions / Restrictions Precautions Precautions: Fall Recall of Precautions/Restrictions: Intact Restrictions Weight Bearing Restrictions Per Provider Order: Yes RLE Weight Bearing Per Provider Order: Weight bearing as tolerated     Mobility  Bed Mobility Overal bed mobility: Needs Assistance Bed Mobility: Sit to Supine       Sit to supine: Mod assist   General bed mobility comments: RLE management  back into bed    Transfers Overall transfer level: Needs assistance Equipment used: Rolling walker (2 wheels), Ambulation equipment used Transfers: Sit to/from Stand, Bed to chair/wheelchair/BSC Sit to Stand: Mod assist, From elevated surface           General transfer comment: Heavy modA to power up to [facility] from chair; able to sustain static standing with crouched posture for posterior peri care. Utilized Stedy from chair back to bed. Worked on serial sit to stands from edge of bed to [employer]    Ambulation/Gait                   Stairs             Wheelchair Mobility     Tilt Bed    Modified Rankin (Stroke Patients Only)       Balance Overall balance assessment: Needs assistance Sitting-balance support: Single extremity supported, Feet supported Sitting balance-Leahy Scale: Fair     Standing balance support: Bilateral upper extremity supported, During functional activity, Reliant on assistive device for balance Standing balance-Leahy Scale: Poor Standing balance comment: Dependent on RW and external support                            Communication Communication Communication: No apparent difficulties  Cognition Arousal: Alert Behavior During Therapy: WFL for tasks assessed/performed   PT - Cognitive impairments: No apparent impairments                         Following commands: Intact      Cueing Cueing Techniques:  Verbal cues, Tactile cues, Visual cues  Exercises Other Exercises Other Exercises: Serial sit to stands x 5    General Comments        Pertinent Vitals/Pain Pain Assessment Pain Assessment: Faces Faces Pain Scale: Hurts even more Pain Location: R hip Pain Descriptors / Indicators: Discomfort, Grimacing, Guarding Pain Intervention(s): Limited activity within patient's tolerance, Monitored during session, Premedicated before session    Home Living                          Prior Function             PT Goals (current goals can now be found in the care plan section) Progress towards PT goals: Progressing toward goals    Frequency    Min 3X/week      PT Plan      Co-evaluation              AM-PAC PT 6 Clicks Mobility   Outcome Measure  Help needed turning from your back to your side while in a flat bed without using bedrails?: A Lot Help needed moving from lying on your back to sitting on the side of a flat bed without using bedrails?: A Lot Help needed moving to and from a bed to a chair (including a wheelchair)?: A Lot Help needed standing up from a chair using your arms (e.g., wheelchair or bedside chair)?: A Lot Help needed to walk in hospital room?: Total Help needed climbing 3-5 steps with a railing? : Total 6 Click Score: 10    End of Session Equipment Utilized During Treatment: Gait belt Activity Tolerance: Patient tolerated treatment well Patient left: with call bell/phone within reach;in bed;with bed alarm set Nurse Communication: Mobility status;Other (comment) (primo fit needed changing) PT Visit Diagnosis: Other abnormalities of gait and mobility (R26.89)     Time: 8570-8485 PT Time Calculation (min) (ACUTE ONLY): 45 min  Charges:    $Therapeutic Activity: 38-52 mins PT General Charges $$ ACUTE PT VISIT: 1 Visit                     Aleck Daring, PT, DPT Acute Rehabilitation Services Office [phone]    Aleck ONEIDA Daring 11/01/2024, 4:14 PM

## 2024-11-01 NOTE — Progress Notes (Signed)
 Orthopedic Surgery Progress Note   Assessment: Patient is a 73 y.o. male with right hip septic arthritis, right gluteus musculature myositis   S/p incision and drainage of right hip   Plan: -Planning for incision and drainage of the right hip today -Intraoperative culture showing gram-positive cocci -Infectious disease consulted, appreciate their recommendations for antibiotic management -Okay for diet from orthopedic perspective -DVT ppx: aspirin  81mg  BID -Weight bearing status: as tolerated  -PT evaluate and treat -Pain control -Dispo: pending continued clinical improvement  ___________________________________________________________________________  Subjective: No acute events overnight.  Patient states that his groin pain has gotten notably better since surgery.  He still has some.  He still has some weakness with hip flexion and extension.  He has noticed less pain when he is moved in bed by staff.  He does not have as much pain when rolling his leg in bed.   Physical Exam:  General: no acute distress, appears stated age Neurologic: alert, answering questions appropriately, following commands Respiratory: unlabored breathing on room air, symmetric chest rise Psychiatric: appropriate affect, normal cadence to speech  MSK:    -Right lower extremity  Less pain this morning with logroll, can tolerate more passive internal and external rotation, pain past 40 degrees of flexion at the hip  Weak hip flexion EHL/TA/GSC intact Plantarflexes and dorsiflexes toes Sensation intact to light touch in sural, saphenous, tibial, deep peroneal, and superficial peroneal nerve distributions Foot warm and well perfused   Yesterday's total administered Morphine  Milligram Equivalents: 90   Patient name: Duane Greene Patient MRN: [id number] Date: 11/01/2024

## 2024-11-01 NOTE — TOC Initial Note (Signed)
 Transition of Care [facility]) - Initial/Assessment Note    Patient Details  Name: Duane Greene MRN: [id number] Date of Birth: 1951-07-05  [facility]) CM/SW Contact:    Rosalva Jon Bloch, RN Phone Number: 11/01/2024, 4:04 PM  Clinical Narrative:                   RIGHT HIP SEPTIC ARTHRITIS      - s/p IRRIGATION AND DEBRIDEMENT WOUND (Right), 12/14 NCM spoke with pt @ beside in regard to d/c planning. Pt states not interested in SNF placement per therapy recommendations. States hoping to progress to home with home health services. Pt without a provider preference. Pt faxed out via hub. Pt without RX med concerns or transportation issues.   IP CM team following and will assist with needs....  Expected Discharge Plan: Home w Home Health Services Barriers to Discharge: Continued Medical Work up   Patient Goals and CMS Choice     Choice offered to / list presented to : Patient      Expected Discharge Plan and Services   Discharge Planning Services: CM Consult   Living arrangements for the past 2 months: Single Family Home                                      Prior Living Arrangements/Services Living arrangements for the past 2 months: Single Family Home Lives with:: Spouse Patient language and need for interpreter reviewed:: Yes Do you feel safe going back to the place where you live?: Yes      Need for Family Participation in Patient Care: Yes (Comment) Care giver support system in place?: Yes (comment) Current home services: DME (RW, BSC, cane) Criminal Activity/Legal Involvement Pertinent to Current Situation/Hospitalization: No - Comment as needed  Activities of Daily Living   ADL Screening (condition at time of admission) Independently performs ADLs?: No Does the patient have a NEW difficulty with bathing/dressing/toileting/self-feeding that is expected to last >3 days?: Yes (Initiates electronic notice to provider for possible OT  consult) Does the patient have a NEW difficulty with getting in/out of bed, walking, or climbing stairs that is expected to last >3 days?: Yes (Initiates electronic notice to provider for possible PT consult) Does the patient have a NEW difficulty with communication that is expected to last >3 days?: No Is the patient deaf or have difficulty hearing?: No Does the patient have difficulty seeing, even when wearing glasses/contacts?: No Does the patient have difficulty concentrating, remembering, or making decisions?: No  Permission Sought/Granted   Permission granted to share information with : Yes, Verbal Permission Granted  Share Information with NAME: Bonnie Ellers  Spouse  [phone]           Emotional Assessment       Orientation: : Oriented to Self, Oriented to Place, Oriented to  Time, Oriented to Situation Alcohol / Substance Use: Not Applicable Psych Involvement: No (comment)  Admission diagnosis:  Septic arthritis (HCC) [M00.9] Muscle strain of right gluteal region, initial encounter [S76.011A] Patient Active Problem List   Diagnosis Date Noted   Septic arthritis (HCC) 10/30/2024   Hyperlipidemia 02/03/2023   Hypertension 02/03/2023   Sleep apnea 02/03/2023   DNR (do not resuscitate) 02/03/2023   Acute pulmonary embolism with acute cor pulmonale (HCC) 02/02/2023   S/P shoulder replacement, 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   S/P lumbar laminectomy 08/28/2020   L knee weakness 10/29/2016  Benign positional vertigo 08/21/2012   Bee sting allergy 04/22/2011   TALIPES CAVUS 08/07/2010   DEGENERATIVE DISC DISEASE, 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 [place] [address] Phone: [phone] Fax: [phone]     Social Drivers of Health (SDOH) Social History: SDOH Screenings   Food Insecurity: No Food Insecurity (10/30/2024)  Housing: Low Risk (10/30/2024)  Transportation Needs: No Transportation Needs (10/30/2024)  Utilities: Not At Risk (10/30/2024)  Social Connections: Moderately Integrated (10/30/2024)  Tobacco Use: Low Risk (10/31/2024)   SDOH Interventions:     Readmission Risk Interventions     No data to display

## 2024-11-01 NOTE — Progress Notes (Signed)
° °  [facility] has been requested to perform a transesophageal echocardiogram on Duane Greene for Bacteremia.     The patient does NOT have any absolute or relative contraindications to a Transesophageal Echocardiogram (TEE).  The patient has: History of Obstructive Sleep Apnea is on CPAP   After careful review of history and examination, the risks and benefits of transesophageal echocardiogram have been explained including risks of esophageal damage, perforation (1:10,000 risk), bleeding, pharyngeal hematoma as well as other potential complications associated with conscious sedation including aspiration, arrhythmia, respiratory failure and death. Alternatives to treatment were discussed, questions were answered. Patient is willing to proceed.   Signed, Morse Clause, PA-C  11/01/2024 4:10 PM   The patient wanted me to contact his wife about the TEE.  Addendum: Returned the following day and discussed the procedure with the patient's wife and answered all the questions that she had.

## 2024-11-01 NOTE — Plan of Care (Signed)

## 2024-11-01 NOTE — Evaluation (Addendum)
 Occupational Therapy Evaluation Patient Details Name: Duane Greene MRN: [id number] DOB: Aug 21, 1951 Today's Date: 11/01/2024   History of Present Illness   Duane Greene is a 73 y.o. male who p/w R hip joint swelling and imaging/arthrocentesis c/f R hip septic arthritis. Past medical history significant of HTN, HLD, OSA (on CPAP), and OA b/l hips s/p US  guided steroid injection on 12/2.     Clinical Impressions PTA Pt was independent with ADL/IADL tasks and functional mobility. Pt currently requires up to Mod A +2 for functional transfers and up to Max A for ADL engagement. Pt remains highly motivated to engage in therapy as he is baseline independent with all tasks. Pt is primarily limited by unsteadiness on feet, decreased activity tolerance/endurance, and pain. OT to continue to follow Pt acutely to facilitate progress towards goals. Patient will benefit from intensive inpatient follow-up therapy, >3 hours/day.      If plan is discharge home, recommend the following:   Two people to help with walking and/or transfers;Two people to help with bathing/dressing/bathroom;Assistance with cooking/housework;Assist for transportation;Help with stairs or ramp for entrance     Functional Status Assessment   Patient has had a recent decline in their functional status and demonstrates the ability to make significant improvements in function in a reasonable and predictable amount of time.     Equipment Recommendations   Wheelchair (measurements OT);Wheelchair cushion (measurements OT)     Recommendations for Other Services   Rehab consult     Precautions/Restrictions   Precautions Precautions: Fall Recall of Precautions/Restrictions: Intact Restrictions Weight Bearing Restrictions Per Provider Order: Yes RLE Weight Bearing Per Provider Order: Weight bearing as tolerated     Mobility Bed Mobility Overal bed mobility: Needs Assistance Bed Mobility: Supine to Sit      Supine to sit: Mod assist, +2 for physical assistance, +2 for safety/equipment, HOB elevated, Used rails     General bed mobility comments: Mod A +2 to come to EOB. Increased assistance to manage RLE off of bed.    Transfers Overall transfer level: Needs assistance Equipment used: Rolling walker (2 wheels) Transfers: Sit to/from Stand, Bed to chair/wheelchair/BSC Sit to Stand: Mod assist, +2 physical assistance, +2 safety/equipment, From elevated surface     Step pivot transfers: Mod assist, +2 physical assistance, +2 safety/equipment     General transfer comment: Mod A +2 and verbal cues for proper hand placement on RW to rise from bed slightly elevated. Pt able to step pivot to recliner on L side with Mod A +2. Increased time and occassional verbal cue required.      Balance Overall balance assessment: Needs assistance Sitting-balance support: Single extremity supported, Feet supported Sitting balance-Leahy Scale: Fair Sitting balance - Comments: Cannot tolerate change. Close supervision to CGA required.   Standing balance support: Bilateral upper extremity supported, During functional activity, Reliant on assistive device for balance Standing balance-Leahy Scale: Poor Standing balance comment: Dependent on RW and external support                           ADL either performed or assessed with clinical judgement   ADL Overall ADL's : Needs assistance/impaired Eating/Feeding: Set up;Sitting   Grooming: Wash/dry face;Set up;Sitting   Upper Body Bathing: Minimal assistance;Sitting   Lower Body Bathing: Maximal assistance   Upper Body Dressing : Set up;Sitting   Lower Body Dressing: Maximal assistance   Toilet Transfer: Moderate assistance;+2 for physical assistance;+2 for safety/equipment;BSC/3in1;Rolling walker (2 wheels)  Toileting- Clothing Manipulation and Hygiene: Total assistance Toileting - Clothing Manipulation Details (indicate cue type and  reason): external catheter             Vision Patient Visual Report: No change from baseline Vision Assessment?: No apparent visual deficits     Perception         Praxis         Pertinent Vitals/Pain Pain Assessment Pain Assessment: 0-10 Pain Score: 5  Pain Location: R hip Pain Descriptors / Indicators: Discomfort, Grimacing, Guarding Pain Intervention(s): Limited activity within patient's tolerance, Monitored during session, Repositioned, Premedicated before session     Extremity/Trunk Assessment         Cervical / Trunk Assessment Cervical / Trunk Assessment: Normal   Communication Communication Communication: No apparent difficulties   Cognition Arousal: Alert Behavior During Therapy: WFL for tasks assessed/performed Cognition: No apparent impairments                               Following commands: Intact       Cueing  General Comments   Cueing Techniques: Verbal cues;Tactile cues;Visual cues      Exercises     Shoulder Instructions      Home Living Family/patient expects to be discharged to:: Private residence Living Arrangements: Spouse/significant other Available Help at Discharge: Family;Available 24 hours/day Type of Home: House Home Access: Stairs to enter [employer] of Steps: 3 Entrance Stairs-Rails: None Home Layout: One level     Bathroom Shower/Tub: Producer, Television/film/video: Handicapped height Bathroom Accessibility: No   Home Equipment: [employer] (2 wheels);Cane - single point;Shower seat - built in;Hand held shower head;BSC/3in1          Prior Functioning/Environment Prior Level of Function : Independent/Modified Independent;Driving             Mobility Comments: Prior to last week, pt was ind with no AD however since then has been barely mobile with RW due to hip pain. ADLs Comments: Ind prior to last week    OT Problem List: Decreased strength;Decreased activity  tolerance;Impaired balance (sitting and/or standing);Decreased knowledge of use of DME or AE;Pain   OT Treatment/Interventions: Self-care/ADL training;Therapeutic exercise;Energy conservation;DME and/or AE instruction;Therapeutic activities;Patient/family education;Balance training      OT Goals(Current goals can be found in the care plan section)   Acute Rehab OT Goals OT Goal Formulation: With patient Time For Goal Achievement: 11/15/24 Potential to Achieve Goals: Good ADL Goals Pt Will Perform Lower Body Bathing: with modified independence;with adaptive equipment;sitting/lateral leans Pt Will Perform Lower Body Dressing: with min assist;with adaptive equipment;sitting/lateral leans Pt Will Transfer to Toilet: with contact guard assist;stand pivot transfer;bedside commode Additional ADL Goal #1: Pt will tolerate 5 minutes of ADL task OOB with Min A to address decreased activity tolerance.   OT Frequency:  Min 2X/week    Co-evaluation              AM-PAC OT 6 Clicks Daily Activity     Outcome Measure Help from another person eating meals?: A Little Help from another person taking care of personal grooming?: A Little Help from another person toileting, which includes using toliet, bedpan, or urinal?: A Lot Help from another person bathing (including washing, rinsing, drying)?: A Lot Help from another person to put on and taking off regular upper body clothing?: A Little Help from another person to put on and taking off regular lower body clothing?:  A Lot 6 Click Score: 15   End of Session Equipment Utilized During Treatment: Gait belt;Rolling walker (2 wheels) Nurse Communication: Mobility status  Activity Tolerance: Patient tolerated treatment well Patient left: in chair;with call bell/phone within reach;with chair alarm set;with family/visitor present  OT Visit Diagnosis: Unsteadiness on feet (R26.81);Muscle weakness (generalized) (M62.81);Pain Pain - Right/Left:  Right Pain - part of body: Hip                Time: 8859-8787 OT Time Calculation (min): 32 min Charges:  OT General Charges $OT Visit: 1 Visit OT Evaluation $OT Eval Moderate Complexity: 1 Mod OT Treatments $Therapeutic Activity: 8-22 mins  Duane Greene, OTR/L.  [facility] Acute Rehabilitation  Office: [phone]   Duane Greene Melinda Gwinner 11/01/2024, 4:20 PM

## 2024-11-01 NOTE — Progress Notes (Signed)
 Inpatient Rehab Admissions Coordinator Note:   Per updated therapy recommendations patient was screened for CIR candidacy by Reche FORBES Lowers, PT. At this time, pt appears to be a potential candidate for CIR. I will place an order for rehab consult for full assessment, per our protocol.  Please contact me any with questions.SABRA Reche Lowers, PT, DPT [phone] 11/01/2024 4:33 PM

## 2024-11-01 NOTE — Progress Notes (Signed)
 PROGRESS NOTE  Duane Greene FMW:[id number] DOB: 1950-11-21 DOA: 10/29/2024 PCP: [name] LABOR, MD   LOS: 2 days   Brief narrative:  Duane Greene is a 73 y.o. male with past medical history significant for hypertension hyperlipidemia, obstructive sleep apnea on CPAP, and OA bilateral hips s/p US  guided steroid injection on 10/19/24 presented to hospital with right hip joint swelling since injection with decreased mobility.  Patient then presented to [facility] ED on 12/ 9/25 where an x-ray was done which was consistent with osteoarthritis so was given medications to go home with.  Subsequently, patient also saw orthopedics in outpatient MRI was ordered.  Patient then went home but then despite taking medications for pain it was unbearable so presented to the ED.   In the ED, patient was febrile with temperature of 100.4 deg F x1. Labs with notable for WBC 12.9-->12.3, lactic acid 2-->1.1, and CRP 27.6.  Urinalysis showed are bacteruria and WBC 0-5. Blood cultures were sent from the ED was consistent with S. aureus. ED provider consulted orthopedic surgery, ID, and  patient was admitted to the hospital for further evaluation and treatment   Assessment/Plan: Principal Problem:   Septic arthritis (HCC)  Right hip septic arthritis Gram-positive cocci bacteremia Infectious disease on board and currently on IV cefazolin .  Orthopedics was consulted and patient underwent right hip aspiration on 10/30/2024.  Subsequently plan was made for I&D of the hip joint and underwent irrigation and debridement of the right hip on 10/31/2024.   Continue pain management.  Intraoperative cultures showing gram-positive cocci.  ID recommended TTE which shows LV ejection fraction of 55 to 60% with no regional wall motion abnormality and no mention of vegetation.  Follow orthopedic and ID recommendations.  On aspirin  twice daily for DVT prophylaxis.  Patient is weightbearing as tolerated.  Essential  hypertension Patient is on losartan -hydrochlorothiazide  50-12.5mg  daily at home.  Blood pressure stable at this time.   OSA Continue CPAP at nighttime.   DVT prophylaxis: enoxaparin  (LOVENOX ) injection 40 mg Start: 10/30/24 0930 SCDs Start: 10/30/24 9083   Disposition: PT OT recommends skilled nursing facility placement at this time.  Status is: Inpatient Remains inpatient appropriate because: Pending clinical improvement, need for rehabilitation    Code Status:     Code Status: Limited: Do not attempt resuscitation (DNR) -DNR-LIMITED -Do Not Intubate/DNI   Family Communication:  Spoke with the patient's spouse at bedside on 11/01/2019.  Consultants: Orthopedics Infectious disease  Procedures: Right hip aspiration 10/30/2024 Irrigation and debridement of the right hip on 10/31/2024  Anti-infectives:  Cefazolin  IV  Anti-infectives (From admission, onward)    Start     Dose/Rate Route Frequency Ordered Stop   11/01/24 1000  terbinafine  (LAMISIL ) tablet 250 mg        250 mg Oral Every Mon 10/30/24 0811     10/31/24 1738  vancomycin  (VANCOCIN ) powder  Status:  Discontinued          As needed 10/31/24 1738 10/31/24 1827   10/30/24 2300  vancomycin  (VANCOCIN ) IVPB 1000 mg/200 mL premix  Status:  Discontinued        1,000 mg 200 mL/hr over 60 Minutes Intravenous Every 12 hours 10/30/24 0959 10/30/24 1002   10/30/24 1000  vancomycin  (VANCOREADY) IVPB 1750 mg/350 mL  Status:  Discontinued        1,750 mg 175 mL/hr over 120 Minutes Intravenous  Once 10/30/24 0952 10/30/24 1002   10/30/24 1000  ceFAZolin  (ANCEF ) IVPB 2g/100 mL premix  2 g 200 mL/hr over 30 Minutes Intravenous Every 8 hours 10/30/24 0957     10/29/24 2245  terbinafine  (LAMISIL ) tablet 250 mg  Status:  Discontinued        250 mg Oral Every 7 days 10/29/24 2235 10/30/24 0810        Subjective: Today, patient was seen and examined at bedside.  Patient complains of decreased pain over the hip but still  difficulty movement and ambulation.  Has been having some dark urine.  Objective: Vitals:   11/01/24 0405 11/01/24 0826  BP: (!) 105/51 106/60  Pulse: 64 62  Resp: 14 18  Temp: 97.9 F (36.6 C) 97.7 F (36.5 C)  SpO2: 96% 95%    Intake/Output Summary (Last 24 hours) at 11/01/2024 1126 Last data filed at 11/01/2024 0924 Gross per 24 hour  Intake 1660 ml  Output 520 ml  Net 1140 ml   There were no vitals filed for this visit. There is no height or weight on file to calculate BMI.   Physical Exam:  GENERAL: Patient is alert awake and oriented. Not in obvious distress. HENT: No scleral pallor or icterus. Pupils equally reactive to light. Oral mucosa is moist NECK: is supple, no gross swelling noted. CHEST: Clear to auscultation. No crackles or wheezes.   CVS: S1 and S2 heard, no murmur. Regular rate and rhythm.  ABDOMEN: Soft, non-tender, bowel sounds are present. EXTREMITIES: No edema.  Right hip with difficulty movement. CNS: Cranial nerves are intact SKIN: warm and dry without rashes.  Data Review: I have personally reviewed the following laboratory data and studies,  CBC: Recent Labs  Lab 10/26/24 0657 10/29/24 1225 10/30/24 1000 10/31/24 0013 11/01/24 0555  WBC 9.0 12.9* 12.3* 14.4* 14.9*  NEUTROABS 7.0 10.0* 9.6*  --  13.1*  HGB 14.8 15.3 14.4 13.8 13.2  HCT 42.0 44.4 40.5 38.9* 37.8*  MCV 93.3 94.3 92.9 91.5 92.4  PLT 147* 124* 106* 133* 165   Basic Metabolic Panel: Recent Labs  Lab 10/26/24 0657 10/29/24 1225 10/30/24 1000 10/31/24 0013  NA 136 132* 134* 135  K 4.1 3.5 3.6 3.5  CL 98 93* 100 99  CO2 28 27 26 25   GLUCOSE 124* 114* 165* 184*  BUN 21 20 22  25*  CREATININE 1.08 1.01 0.88  0.87 0.88  CALCIUM 9.5 8.3* 7.9* 8.3*   Liver Function Tests: Recent Labs  Lab 10/29/24 1225 10/30/24 1000  AST 48* 51*  ALT 55* 50*  ALKPHOS 98 103  BILITOT 1.8* 1.2  PROT 7.1 5.8*  ALBUMIN 3.1* 2.5*   No results for input(s): LIPASE, AMYLASE in  the last 168 hours. No results for input(s): AMMONIA in the last 168 hours. Cardiac Enzymes: Recent Labs  Lab 10/30/24 1000  CKTOTAL 148   BNP (last 3 results) No results for input(s): BNP in the last 8760 hours.  ProBNP (last 3 results) No results for input(s): PROBNP in the last 8760 hours.  CBG: No results for input(s): GLUCAP in the last 168 hours. Recent Results (from the past 240 hours)  Blood Culture (routine x 2)     Status: Abnormal   Collection Time: 10/29/24  5:09 PM   Specimen: BLOOD LEFT ARM  Result Value Ref Range Status   Specimen Description BLOOD LEFT ARM  Final   Special Requests   Final    BOTTLES DRAWN AEROBIC AND ANAEROBIC Blood Culture adequate volume   Culture  Setup Time   Final    GRAM POSITIVE COCCI IN  BOTH AEROBIC AND ANAEROBIC BOTTLES CRITICAL RESULT CALLED TO, READ BACK BY AND VERIFIED WITH: PHARMD M. MITCHELL 1212 AT 0955, ADC Performed at [facility], [address] [place], [place] [address]    Culture STAPHYLOCOCCUS AUREUS (A)  Final   Report Status 11/01/2024 FINAL  Final   Organism ID, Bacteria STAPHYLOCOCCUS AUREUS  Final      Susceptibility   Staphylococcus aureus - MIC*    CIPROFLOXACIN <=0.5 SENSITIVE Sensitive     ERYTHROMYCIN <=0.25 SENSITIVE Sensitive     GENTAMICIN <=0.5 SENSITIVE Sensitive     OXACILLIN 0.5 SENSITIVE Sensitive     TETRACYCLINE <=1 SENSITIVE Sensitive     VANCOMYCIN  1 SENSITIVE Sensitive     TRIMETH/SULFA <=10 SENSITIVE Sensitive     CLINDAMYCIN <=0.25 SENSITIVE Sensitive     RIFAMPIN <=0.5 SENSITIVE Sensitive     Inducible Clindamycin NEGATIVE Sensitive     LINEZOLID 2 SENSITIVE Sensitive     * STAPHYLOCOCCUS AUREUS  Blood Culture ID Panel (Reflexed)     Status: Abnormal   Collection Time: 10/29/24  5:09 PM  Result Value Ref Range Status   Enterococcus faecalis NOT DETECTED NOT DETECTED Final   Enterococcus Faecium NOT DETECTED NOT DETECTED Final   Listeria monocytogenes NOT DETECTED NOT  DETECTED Final   Staphylococcus species DETECTED (A) NOT DETECTED Final    Comment: CRITICAL RESULT CALLED TO, READ BACK BY AND VERIFIED WITH: PHARMD M. MITCHELL 1212 AT 0955, ADC    Staphylococcus aureus (BCID) DETECTED (A) NOT DETECTED Final    Comment: CRITICAL RESULT CALLED TO, READ BACK BY AND VERIFIED WITH: PHARMD M. MITCHELL 1212 AT 0955, ADC    Staphylococcus epidermidis NOT DETECTED NOT DETECTED Final   Staphylococcus lugdunensis NOT DETECTED NOT DETECTED Final   Streptococcus species NOT DETECTED NOT DETECTED Final   Streptococcus agalactiae NOT DETECTED NOT DETECTED Final   Streptococcus pneumoniae NOT DETECTED NOT DETECTED Final   Streptococcus pyogenes NOT DETECTED NOT DETECTED Final   A.calcoaceticus-baumannii NOT DETECTED NOT DETECTED Final   Bacteroides fragilis NOT DETECTED NOT DETECTED Final   Enterobacterales NOT DETECTED NOT DETECTED Final   Enterobacter cloacae complex NOT DETECTED NOT DETECTED Final   Escherichia coli NOT DETECTED NOT DETECTED Final   Klebsiella aerogenes NOT DETECTED NOT DETECTED Final   Klebsiella oxytoca NOT DETECTED NOT DETECTED Final   Klebsiella pneumoniae NOT DETECTED NOT DETECTED Final   Proteus species NOT DETECTED NOT DETECTED Final   Salmonella species NOT DETECTED NOT DETECTED Final   Serratia marcescens NOT DETECTED NOT DETECTED Final   Haemophilus influenzae NOT DETECTED NOT DETECTED Final   Neisseria meningitidis NOT DETECTED NOT DETECTED Final   Pseudomonas aeruginosa NOT DETECTED NOT DETECTED Final   Stenotrophomonas maltophilia NOT DETECTED NOT DETECTED Final   Candida albicans NOT DETECTED NOT DETECTED Final   Candida auris NOT DETECTED NOT DETECTED Final   Candida glabrata NOT DETECTED NOT DETECTED Final   Candida krusei NOT DETECTED NOT DETECTED Final   Candida parapsilosis NOT DETECTED NOT DETECTED Final   Candida tropicalis NOT DETECTED NOT DETECTED Final   Cryptococcus neoformans/gattii NOT DETECTED NOT DETECTED Final    Meth resistant mecA/C and MREJ NOT DETECTED NOT DETECTED Final    Comment: Performed at [facility], [address], [place], [place] [address]  Blood Culture (routine x 2)     Status: Abnormal   Collection Time: 10/29/24  5:14 PM   Specimen: BLOOD RIGHT ARM  Result Value Ref Range Status   Specimen  Description BLOOD RIGHT ARM  Final   Special Requests   Final    BOTTLES DRAWN AEROBIC AND ANAEROBIC Blood Culture adequate volume   Culture  Setup Time   Final    GRAM POSITIVE COCCI IN BOTH AEROBIC AND ANAEROBIC BOTTLES CRITICAL VALUE NOTED.  VALUE IS CONSISTENT WITH PREVIOUSLY REPORTED AND CALLED VALUE.    Culture (A)  Final    STAPHYLOCOCCUS AUREUS SUSCEPTIBILITIES PERFORMED ON PREVIOUS CULTURE WITHIN THE LAST 5 DAYS. Performed at [facility] Lab, [address], [place], [place] [address]    Report Status 11/01/2024 FINAL  Final  Resp panel by RT-PCR (RSV, Flu A&B, Covid) Anterior Nasal Swab     Status: None   Collection Time: 10/29/24  5:33 PM   Specimen: Anterior Nasal Swab  Result Value Ref Range Status   SARS Coronavirus 2 by RT PCR NEGATIVE NEGATIVE Final   Influenza A by PCR NEGATIVE NEGATIVE Final   Influenza B by PCR NEGATIVE NEGATIVE Final    Comment: (NOTE) The Xpert Xpress SARS-CoV-2/FLU/RSV plus assay is intended as an aid in the diagnosis of influenza from Nasopharyngeal swab specimens and should not be used as a sole basis for treatment. Nasal washings and aspirates are unacceptable for Xpert Xpress SARS-CoV-2/FLU/RSV testing.  Fact Sheet for Patients: bloggercourse.com  Fact Sheet for Healthcare Providers: seriousbroker.it  This test is not yet approved or cleared by the [place]  FDA and has been authorized for detection and/or diagnosis of SARS-CoV-2 by FDA under an Emergency Use Authorization (EUA). This EUA will remain in effect (meaning this test can be used) for the duration of  the COVID-19 declaration under Section 564(b)(1) of the Act, 21 U.S.C. section 360bbb-3(b)(1), unless the authorization is terminated or revoked.     Resp Syncytial Virus by PCR NEGATIVE NEGATIVE Final    Comment: (NOTE) Fact Sheet for Patients: bloggercourse.com  Fact Sheet for Healthcare Providers: seriousbroker.it  This test is not yet approved or cleared by the [place]  FDA and has been authorized for detection and/or diagnosis of SARS-CoV-2 by FDA under an Emergency Use Authorization (EUA). This EUA will remain in effect (meaning this test can be used) for the duration of the COVID-19 declaration under Section 564(b)(1) of the Act, 21 U.S.C. section 360bbb-3(b)(1), unless the authorization is terminated or revoked.  Performed at [facility], [address], [place], [place] [address]   Urine Culture     Status: None   Collection Time: 10/30/24  7:49 AM   Specimen: Urine, Clean Catch  Result Value Ref Range Status   Specimen Description URINE, CLEAN CATCH  Final   Special Requests NONE  Final   Culture   Final    NO GROWTH Performed at [facility], [address] [place], [place] [address]    Report Status 10/31/2024 FINAL  Final  Culture, blood (Routine X 2) w Reflex to ID Panel     Status: None (Preliminary result)   Collection Time: 10/31/24 12:15 AM   Specimen: BLOOD RIGHT HAND  Result Value Ref Range Status   Specimen Description BLOOD RIGHT HAND  Final   Special Requests   Final    BOTTLES DRAWN AEROBIC AND ANAEROBIC Blood Culture adequate volume   Culture   Final    NO GROWTH 1 DAY Performed at [facility], [address] [place], [place] [address]    Report Status PENDING  Incomplete  Culture, blood (Routine X 2) w Reflex to ID Panel  Status: None (Preliminary result)   Collection Time: 10/31/24 12:26 AM   Specimen: BLOOD  Result Value Ref Range Status   Specimen Description  BLOOD BLOOD RIGHT ARM  Final   Special Requests   Final    BOTTLES DRAWN AEROBIC AND ANAEROBIC Blood Culture adequate volume   Culture   Final    NO GROWTH 1 DAY Performed at  [facility] Lab, [address] [place], [place] [address]    Report Status PENDING  Incomplete  Aerobic/Anaerobic Culture w Gram Stain (surgical/deep wound)     Status: None (Preliminary result)   Collection Time: 10/31/24  5:45 PM   Specimen: Synovial, Right Hip; Body Fluid  Result Value Ref Range Status   Specimen Description SYNOVIAL  Final   Special Requests RIGHT HIP  Final   Gram Stain   Final    MODERATE GRAM POSITIVE COCCI RARE GRAM POSITIVE COCCI    Culture   Final    NO GROWTH < 12 HOURS Performed at [facility] Lab, [address] [place], [place] [address]    Report Status PENDING  Incomplete     Studies: ECHOCARDIOGRAM COMPLETE Result Date: 11/01/2024    ECHOCARDIOGRAM REPORT   Patient Name:   Duane Greene Date of Exam: 11/01/2024 Medical Rec #:  [id number]      Height:       70.0 in Accession #:    [id number]     Weight:       215.0 lb Date of Birth:  August 08, 1951      BSA:          2.152 m Patient Age:    73 years       BP:           105/51 mmHg Patient Gender: M              HR:           65 bpm. Exam Location:  Inpatient Procedure: 2D Echo, Cardiac Doppler and Color Doppler (Both Spectral and Color            Flow Doppler were utilized during procedure). Indications:    Bacteremia R78.81  History:        Patient has prior history of Echocardiogram examinations, most                 recent 02/01/2023. Signs/Symptoms:Hypertensive Heart Disease.  Sonographer:    Nathanel Devonshire Referring Phys: [id number] MICHAEL A MOORE IMPRESSIONS  1. Left ventricular ejection fraction, by estimation, is 55 to 60%. Left ventricular ejection fraction by 2D MOD biplane is 59.5 %. The left ventricle has normal function. The left ventricle has no regional wall motion abnormalities. Left ventricular diastolic parameters were  normal.  2. Right ventricular systolic function is normal. The right ventricular size is normal. There is normal pulmonary artery systolic pressure.  3. The mitral valve is normal in structure. No evidence of mitral valve regurgitation. No evidence of mitral stenosis.  4. The aortic valve is normal in structure. Aortic valve regurgitation is not visualized. No aortic stenosis is present. Comparison(s): No significant change from prior study. FINDINGS  Left Ventricle: Left ventricular ejection fraction, by estimation, is 55 to 60%. Left ventricular ejection fraction by 2D MOD biplane is 59.5 %. The left ventricle has normal function. The left ventricle has no regional wall motion abnormalities. The left ventricular internal cavity size was normal in size. There is no left ventricular hypertrophy. Left ventricular diastolic parameters were  normal. Right Ventricle: The right ventricular size is normal. No increase in right ventricular wall thickness. Right ventricular systolic function is normal. There is normal pulmonary artery systolic pressure. The tricuspid regurgitant velocity is 2.71 m/s, and  with an assumed right atrial pressure of 3 mmHg, the estimated right ventricular systolic pressure is 32.4 mmHg. Left Atrium: Left atrial size was normal in size. Right Atrium: Right atrial size was normal in size. Pericardium: There is no evidence of pericardial effusion. Mitral Valve: The mitral valve is normal in structure. No evidence of mitral valve regurgitation. No evidence of mitral valve stenosis. Tricuspid Valve: The tricuspid valve is normal in structure. Tricuspid valve regurgitation is not demonstrated. No evidence of tricuspid stenosis. Aortic Valve: The aortic valve is normal in structure. Aortic valve regurgitation is not visualized. No aortic stenosis is present. Aortic valve mean gradient measures 3.0 mmHg. Aortic valve peak gradient measures 7.0 mmHg. Aortic valve area, by VTI measures 3.03 cm. Pulmonic  Valve: The pulmonic valve was not well visualized. Pulmonic valve regurgitation is not visualized. No evidence of pulmonic stenosis. Aorta: The aortic root is normal in size and structure. Venous: The inferior vena cava was not well visualized. IAS/Shunts: The interatrial septum was not well visualized.  LEFT VENTRICLE PLAX 2D                        Biplane EF (MOD) LVIDd:         5.10 cm         LV Biplane EF:   Left LVIDs:         3.40 cm                          ventricular LV PW:         1.00 cm                          ejection LV IVS:        0.90 cm                          fraction by LVOT diam:     2.10 cm                          2D MOD LV SV:         80                               biplane is LV SV Index:   37                               59.5 %. LVOT Area:     3.46 cm LV IVRT:       77 msec         Diastology                                LV e' medial:    9.25 cm/s                                LV E/e' medial:  9.7  LV Volumes (MOD)               LV e' lateral:   9.14 cm/s LV vol d, MOD    93.1 ml       LV E/e' lateral: 9.8 A2C: LV vol d, MOD    68.6 ml A4C: LV vol s, MOD    33.9 ml A2C: LV vol s, MOD    29.5 ml A4C: LV SV MOD A2C:   59.2 ml LV SV MOD A4C:   68.6 ml LV SV MOD BP:    47.4 ml RIGHT VENTRICLE RV Basal diam:  3.50 cm     PULMONARY VEINS RV S prime:     17.10 cm/s  Diastolic Velocity: 29.00 cm/s TAPSE (M-mode): 2.7 cm      S/D Velocity:       1.60                             Systolic Velocity:  45.30 cm/s LEFT ATRIUM             Index        RIGHT ATRIUM           Index LA diam:        3.80 cm 1.77 cm/m   RA Area:     17.70 cm LA Vol (A2C):   44.5 ml 20.67 ml/m  RA Volume:   48.80 ml  22.67 ml/m LA Vol (A4C):   37.2 ml 17.28 ml/m LA Biplane Vol: 40.7 ml 18.91 ml/m  AORTIC VALVE AV Area (Vmax):    2.91 cm AV Area (Vmean):   3.16 cm AV Area (VTI):     3.03 cm AV Vmax:           132.00 cm/s AV Vmean:          78.000 cm/s AV VTI:            0.264 m AV Peak Grad:      7.0 mmHg AV Mean  Grad:      3.0 mmHg LVOT Vmax:         111.00 cm/s LVOT Vmean:        71.200 cm/s LVOT VTI:          0.231 m LVOT/AV VTI ratio: 0.88  AORTA Ao Root diam: 3.50 cm Ao Asc diam:  2.90 cm MITRAL VALVE               TRICUSPID VALVE MV Area (PHT): 3.34 cm    TR Peak grad:   29.4 mmHg MV Decel Time: 227 msec    TR Vmax:        271.00 cm/s MV E velocity: 89.50 cm/s MV A velocity: 93.40 cm/s  SHUNTS MV E/A ratio:  0.96        Systemic VTI:  0.23 m                            Systemic Diam: 2.10 cm Joelle Azobou Tonleu Electronically signed by Joelle Cedars Tonleu Signature Date/Time: 11/01/2024/10:35:36 AM    Final    DG FL ASP/INJ MAJOR (SHOULDER, HIP, KNEE) Addendum Date: 10/30/2024 ADDENDUM REPORT: 10/30/2024 12:37 ADDENDUM: CLINICAL DATA: 73 year old male with right hip pain, concerning for septic arthritis. IR requested for diagnostic right hip intra-articular aspiration. Electronically Signed   By: Ester Sides M.D.   On: 10/30/2024 12:37   Result Date: 10/30/2024  CLINICAL DATA:  73 year old male with right hip pain, concerning for septic arthritis. IR requested for diagnostic right hip intra-articular aspiration. EXAM: RIGHT HIP INJECTION AND ASPIRATION UNDER FLUOROSCOPY COMPARISON:  MR Hip Right w w/o cm on 10/29/2024. FLUOROSCOPY: Radiation Exposure Index (as provided by the fluoroscopic device): 7.0 mGy Kerma PROCEDURE: Overlying skin prepped with Betadine, draped in the usual sterile fashion, and infiltrated locally with buffered Lidocaine . Straight 20 gauge spinal needle advanced to the superolateral margin of the right femoral head, with immediate return of turbid, straw color fluid in needle hub, which was aspirated. Diagnostic injection of iodinated contrast demonstrates intra-articular spread without intravascular component. Subsequent aspiration of fluid was attempted post contrast injection, with total of 1 mL of fluid aspirated. Needle was then removed.  No immediate complication. IMPRESSION:  Technically successful right hip injection and aspiration under fluoroscopy. Procedure performed by: Carlin Griffon, PA-C. Electronically Signed: By: Ester Sides M.D. On: 10/30/2024 12:34      Vernal Alstrom, MD  [facility] 11/01/2024  If 7PM-7AM, please contact night-coverage

## 2024-11-02 ENCOUNTER — Inpatient Hospital Stay (HOSPITAL_COMMUNITY)

## 2024-11-02 LAB — GLUCOSE, CAPILLARY: Glucose-Capillary: 149 mg/dL — ABNORMAL HIGH (ref 70–99)

## 2024-11-02 NOTE — Progress Notes (Addendum)
 PROGRESS NOTE  Duane Greene FMW:[id number] DOB: [birth year] DOA: 10/29/2024 PCP: [name] LABOR, MD   LOS: 3 days   Brief narrative:  Duane Greene is a 73 y.o. male with past medical history significant for hypertension hyperlipidemia, obstructive sleep apnea on CPAP, and OA bilateral hips s/p US  guided steroid injection on 10/19/24 presented to hospital with right hip joint swelling since injection with decreased mobility.  Patient then presented to [facility] ED on 12/ 9/25 where an x-ray was done which was consistent with osteoarthritis so was given medications to go home with.  Subsequently, patient also saw orthopedics in outpatient MRI was ordered.  Patient then went home but then despite taking medications for pain it was unbearable so presented to the ED.   In the ED, patient was febrile with temperature of 100.4 deg F x1. Labs with notable for WBC 12.9-->12.3, lactic acid 2-->1.1, and CRP 27.6.  Urinalysis showed are bacteruria and WBC 0-5. Blood cultures were sent from the ED was consistent with S. aureus. ED provider consulted orthopedic surgery, ID, and  patient was admitted to the hospital for further evaluation and treatment   Assessment/Plan: Principal Problem:   Septic arthritis (HCC)  Right hip septic arthritis Gram-positive cocci bacteremia Infectious disease on board and currently on IV cefazolin .  Orthopedics was consulted and patient underwent right hip aspiration on 10/30/2024.  Subsequently plan was made for I&D of the hip joint and underwent irrigation and debridement of the right hip on 10/31/2024.   Continue pain management.  Intraoperative cultures showing gram-positive cocci.  ID recommended TTE which shows LV ejection fraction of 55 to 60% with no regional wall motion abnormality and no mention of vegetation.  ID has recommended TEE at this time.  Follow orthopedic and ID recommendations.  On aspirin  twice daily for DVT prophylaxis.  Patient is weightbearing as  tolerated.  Essential hypertension Patient is on losartan -hydrochlorothiazide  50-12.5mg  daily at home.  Blood pressure stable at this time.   OSA Continue CPAP at nighttime.   Hyponatremia. Mild improved.  Follow closely.   DVT prophylaxis: enoxaparin  (LOVENOX ) injection 40 mg Start: 10/30/24 0930 SCDs Start: 10/30/24 9083   Disposition: PT OT recommends CIR at this time  Status is: Inpatient Remains inpatient appropriate because: Pending clinical improvement, need for rehabilitation    Code Status:     Code Status: Limited: Do not attempt resuscitation (DNR) -DNR-LIMITED -Do Not Intubate/DNI   Family Communication:  Spoke with the patient's spouse at bedside on 11/02/2024.  Consultants: Orthopedics Infectious disease  Procedures: Right hip aspiration 10/30/2024 Irrigation and debridement of the right hip on 10/31/2024  Anti-infectives:  Cefazolin  IV  Anti-infectives (From admission, onward)    Start     Dose/Rate Route Frequency Ordered Stop   11/01/24 1000  terbinafine  (LAMISIL ) tablet 250 mg        250 mg Oral Every [facility] 10/30/24 0811     10/31/24 1738  vancomycin  (VANCOCIN ) powder  Status:  Discontinued          As needed 10/31/24 1738 10/31/24 1827   10/30/24 2300  vancomycin  (VANCOCIN ) IVPB 1000 mg/200 mL premix  Status:  Discontinued        1,000 mg 200 mL/hr over 60 Minutes Intravenous Every 12 hours 10/30/24 0959 10/30/24 1002   10/30/24 1000  vancomycin  (VANCOREADY) IVPB 1750 mg/350 mL  Status:  Discontinued        1,750 mg 175 mL/hr over 120 Minutes Intravenous  Once 10/30/24 0952 10/30/24 1002  10/30/24 1000  ceFAZolin  (ANCEF ) IVPB 2g/100 mL premix        2 g 200 mL/hr over 30 Minutes Intravenous Every 8 hours 10/30/24 0957     10/29/24 2245  terbinafine  (LAMISIL ) tablet 250 mg  Status:  Discontinued        250 mg Oral Every 7 days 10/29/24 2235 10/30/24 0810        Subjective: Today, patient was seen and examined at bedside.  Patient states  that he does not feel too well today.  Denies any nausea vomiting shortness of breath dyspnea but overall weakness.  Has decreased pain on the legs.   Objective: Vitals:   11/02/24 0516 11/02/24 0756  BP: 113/63 (!) 113/52  Pulse: 73 71  Resp: 16 17  Temp: (!) 100.7 F (38.2 C) 99.4 F (37.4 C)  SpO2: 96% 97%    Intake/Output Summary (Last 24 hours) at 11/02/2024 1028 Last data filed at 11/02/2024 9095 Gross per 24 hour  Intake 600 ml  Output 1200 ml  Net -600 ml   There were no vitals filed for this visit. There is no height or weight on file to calculate BMI.   Physical Exam:  GENERAL: Patient is alert awake and oriented. Not in obvious distress.  Appears slightly weak. HENT: No scleral pallor or icterus. Pupils equally reactive to light. Oral mucosa is moist NECK: is supple, no gross swelling noted. CHEST: Clear to auscultation. No crackles or wheezes.   CVS: S1 and S2 heard, no murmur. Regular rate and rhythm.  ABDOMEN: Soft, non-tender, bowel sounds are present. EXTREMITIES: No edema.  Right hip with difficulty movement but better range of movement than before. CNS: Cranial nerves are intact SKIN: warm and dry without rashes.  Data Review: I have personally reviewed the following laboratory data and studies,  CBC: Recent Labs  Lab 10/29/24 1225 10/30/24 1000 10/31/24 0013 11/01/24 0555  WBC 12.9* 12.3* 14.4* 14.9*  NEUTROABS 10.0* 9.6*  --  13.1*  HGB 15.3 14.4 13.8 13.2  HCT 44.4 40.5 38.9* 37.8*  MCV 94.3 92.9 91.5 92.4  PLT 124* 106* 133* 165   Basic Metabolic Panel: Recent Labs  Lab 10/29/24 1225 10/30/24 1000 10/31/24 0013  NA 132* 134* 135  K 3.5 3.6 3.5  CL 93* 100 99  CO2 27 26 25   GLUCOSE 114* 165* 184*  BUN 20 22 25*  CREATININE 1.01 0.88  0.87 0.88  CALCIUM 8.3* 7.9* 8.3*   Liver Function Tests: Recent Labs  Lab 10/29/24 1225 10/30/24 1000  AST 48* 51*  ALT 55* 50*  ALKPHOS 98 103  BILITOT 1.8* 1.2  PROT 7.1 5.8*  ALBUMIN  3.1* 2.5*   No results for input(s): LIPASE, AMYLASE in the last 168 hours. No results for input(s): AMMONIA in the last 168 hours. Cardiac Enzymes: Recent Labs  Lab 10/30/24 1000  CKTOTAL 148   BNP (last 3 results) No results for input(s): BNP in the last 8760 hours.  ProBNP (last 3 results) No results for input(s): PROBNP in the last 8760 hours.  CBG: Recent Labs  Lab 11/02/24 0559  GLUCAP 149*   Recent Results (from the past 240 hours)  Blood Culture (routine x 2)     Status: Abnormal   Collection Time: 10/29/24  5:09 PM   Specimen: BLOOD LEFT ARM  Result Value Ref Range Status   Specimen Description BLOOD LEFT ARM  Final   Special Requests   Final    BOTTLES DRAWN AEROBIC AND ANAEROBIC  Blood Culture adequate volume   Culture  Setup Time   Final    GRAM POSITIVE COCCI IN BOTH AEROBIC AND ANAEROBIC BOTTLES CRITICAL RESULT CALLED TO, READ BACK BY AND VERIFIED WITH: PHARMD M. MITCHELL 1212 AT 0955, ADC Performed at [facility], [address], [place], [place] [address]    Culture STAPHYLOCOCCUS AUREUS (A)  Final   Report Status 11/01/2024 FINAL  Final   Organism ID, Bacteria STAPHYLOCOCCUS AUREUS  Final      Susceptibility   Staphylococcus aureus - MIC*    CIPROFLOXACIN <=0.5 SENSITIVE Sensitive     ERYTHROMYCIN <=0.25 SENSITIVE Sensitive     GENTAMICIN <=0.5 SENSITIVE Sensitive     OXACILLIN 0.5 SENSITIVE Sensitive     TETRACYCLINE <=1 SENSITIVE Sensitive     VANCOMYCIN  1 SENSITIVE Sensitive     TRIMETH/SULFA <=10 SENSITIVE Sensitive     CLINDAMYCIN <=0.25 SENSITIVE Sensitive     RIFAMPIN <=0.5 SENSITIVE Sensitive     Inducible Clindamycin NEGATIVE Sensitive     LINEZOLID 2 SENSITIVE Sensitive     * STAPHYLOCOCCUS AUREUS  Blood Culture ID Panel (Reflexed)     Status: Abnormal   Collection Time: 10/29/24  5:09 PM  Result Value Ref Range Status   Enterococcus faecalis NOT DETECTED NOT DETECTED Final   Enterococcus Faecium NOT DETECTED NOT  DETECTED Final   Listeria monocytogenes NOT DETECTED NOT DETECTED Final   Staphylococcus species DETECTED (A) NOT DETECTED Final    Comment: CRITICAL RESULT CALLED TO, READ BACK BY AND VERIFIED WITH: PHARMD M. MITCHELL 1212 AT 0955, ADC    Staphylococcus aureus (BCID) DETECTED (A) NOT DETECTED Final    Comment: CRITICAL RESULT CALLED TO, READ BACK BY AND VERIFIED WITH: PHARMD M. MITCHELL 1212 AT 0955, ADC    Staphylococcus epidermidis NOT DETECTED NOT DETECTED Final   Staphylococcus lugdunensis NOT DETECTED NOT DETECTED Final   Streptococcus species NOT DETECTED NOT DETECTED Final   Streptococcus agalactiae NOT DETECTED NOT DETECTED Final   Streptococcus pneumoniae NOT DETECTED NOT DETECTED Final   Streptococcus pyogenes NOT DETECTED NOT DETECTED Final   A.calcoaceticus-baumannii NOT DETECTED NOT DETECTED Final   Bacteroides fragilis NOT DETECTED NOT DETECTED Final   Enterobacterales NOT DETECTED NOT DETECTED Final   Enterobacter cloacae complex NOT DETECTED NOT DETECTED Final   Escherichia coli NOT DETECTED NOT DETECTED Final   Klebsiella aerogenes NOT DETECTED NOT DETECTED Final   Klebsiella oxytoca NOT DETECTED NOT DETECTED Final   Klebsiella pneumoniae NOT DETECTED NOT DETECTED Final   Proteus species NOT DETECTED NOT DETECTED Final   Salmonella species NOT DETECTED NOT DETECTED Final   Serratia marcescens NOT DETECTED NOT DETECTED Final   Haemophilus influenzae NOT DETECTED NOT DETECTED Final   Neisseria meningitidis NOT DETECTED NOT DETECTED Final   Pseudomonas aeruginosa NOT DETECTED NOT DETECTED Final   Stenotrophomonas maltophilia NOT DETECTED NOT DETECTED Final   Candida albicans NOT DETECTED NOT DETECTED Final   Candida auris NOT DETECTED NOT DETECTED Final   Candida glabrata NOT DETECTED NOT DETECTED Final   Candida krusei NOT DETECTED NOT DETECTED Final   Candida parapsilosis NOT DETECTED NOT DETECTED Final   Candida tropicalis NOT DETECTED NOT DETECTED Final    Cryptococcus neoformans/gattii NOT DETECTED NOT DETECTED Final   Meth resistant mecA/C and MREJ NOT DETECTED NOT DETECTED Final    Comment: Performed at [facility], [address], [place], [place] [address]  Blood Culture (routine x 2)     Status: Abnormal   Collection  Time: 10/29/24  5:14 PM   Specimen: BLOOD RIGHT ARM  Result Value Ref Range Status   Specimen Description BLOOD RIGHT ARM  Final   Special Requests   Final    BOTTLES DRAWN AEROBIC AND ANAEROBIC Blood Culture adequate volume   Culture  Setup Time   Final    GRAM POSITIVE COCCI IN BOTH AEROBIC AND ANAEROBIC BOTTLES CRITICAL VALUE NOTED.  VALUE IS CONSISTENT WITH PREVIOUSLY REPORTED AND CALLED VALUE.    Culture (A)  Final    STAPHYLOCOCCUS AUREUS SUSCEPTIBILITIES PERFORMED ON PREVIOUS CULTURE WITHIN THE LAST 5 DAYS. Performed at [facility], [address] [place], [place] [address]    Report Status 11/01/2024 FINAL  Final  Resp panel by RT-PCR (RSV, Flu A&B, Covid) Anterior Nasal Swab     Status: None   Collection Time: 10/29/24  5:33 PM   Specimen: Anterior Nasal Swab  Result Value Ref Range Status   SARS Coronavirus 2 by RT PCR NEGATIVE NEGATIVE Final   Influenza A by PCR NEGATIVE NEGATIVE Final   Influenza B by PCR NEGATIVE NEGATIVE Final    Comment: (NOTE) The Xpert Xpress SARS-CoV-2/FLU/RSV plus assay is intended as an aid in the diagnosis of influenza from Nasopharyngeal swab specimens and should not be used as a sole basis for treatment. Nasal washings and aspirates are unacceptable for Xpert Xpress SARS-CoV-2/FLU/RSV testing.  Fact Sheet for Patients: bloggercourse.com  Fact Sheet for Healthcare Providers: seriousbroker.it  This test is not yet approved or cleared by the [place]  FDA and has been authorized for detection and/or diagnosis of SARS-CoV-2 by FDA under an Emergency Use Authorization (EUA). This EUA will remain in effect  (meaning this test can be used) for the duration of the COVID-19 declaration under Section 564(b)(1) of the Act, 21 U.S.C. section 360bbb-3(b)(1), unless the authorization is terminated or revoked.     Resp Syncytial Virus by PCR NEGATIVE NEGATIVE Final    Comment: (NOTE) Fact Sheet for Patients: bloggercourse.com  Fact Sheet for Healthcare Providers: seriousbroker.it  This test is not yet approved or cleared by the [place]  FDA and has been authorized for detection and/or diagnosis of SARS-CoV-2 by FDA under an Emergency Use Authorization (EUA). This EUA will remain in effect (meaning this test can be used) for the duration of the COVID-19 declaration under Section 564(b)(1) of the Act, 21 U.S.C. section 360bbb-3(b)(1), unless the authorization is terminated or revoked.  Performed at [facility], [address], [place], [place] [address]   Urine Culture     Status: None   Collection Time: 10/30/24  7:49 AM   Specimen: Urine, Clean Catch  Result Value Ref Range Status   Specimen Description URINE, CLEAN CATCH  Final   Special Requests NONE  Final   Culture   Final    NO GROWTH Performed at [facility], [address], [place], [place] [address]    Report Status 10/31/2024 FINAL  Final  Culture, blood (Routine X 2) w Reflex to ID Panel     Status: None (Preliminary result)   Collection Time: 10/31/24 12:15 AM   Specimen: BLOOD RIGHT HAND  Result Value Ref Range Status   Specimen Description BLOOD RIGHT HAND  Final   Special Requests   Final    BOTTLES DRAWN AEROBIC AND ANAEROBIC Blood Culture adequate volume   Culture   Final    NO GROWTH 1 DAY Performed at [facility], [address] [place], [place]  [address]    Report Status PENDING  Incomplete  Culture, blood (Routine X 2) w Reflex to ID Panel     Status: None (Preliminary result)   Collection Time: 10/31/24 12:26 AM   Specimen: BLOOD   Result Value Ref Range Status   Specimen Description BLOOD BLOOD RIGHT ARM  Final   Special Requests   Final    BOTTLES DRAWN AEROBIC AND ANAEROBIC Blood Culture adequate volume   Culture   Final    NO GROWTH 1 DAY Performed at [facility], [address], [place], [place] [address]    Report Status PENDING  Incomplete  Aerobic/Anaerobic Culture w Gram Stain (surgical/deep wound)     Status: None (Preliminary result)   Collection Time: 10/31/24  5:45 PM   Specimen: Synovial, Right Hip; Body Fluid  Result Value Ref Range Status   Specimen Description SYNOVIAL  Final   Special Requests RIGHT HIP  Final   Gram Stain   Final    MODERATE GRAM POSITIVE COCCI RARE GRAM POSITIVE COCCI    Culture   Final    RARE STAPHYLOCOCCUS AUREUS SUSCEPTIBILITIES TO FOLLOW Performed at [facility], [address], [place], [place] [address]    Report Status PENDING  Incomplete     Studies: ECHOCARDIOGRAM COMPLETE Result Date: 11/01/2024    ECHOCARDIOGRAM REPORT   Patient Name:   Duane Greene Date of Exam: 11/01/2024 Medical Rec #:  [id number]      Height:       70.0 in Accession #:    [id number]     Weight:       215.0 lb Date of Birth:  September 18, 1951      BSA:          2.152 m Patient Age:    73 years       BP:           105/51 mmHg Patient Gender: M              HR:           65 bpm. Exam Location:  Inpatient Procedure: 2D Echo, Cardiac Doppler and Color Doppler (Both Spectral and Color            Flow Doppler were utilized during procedure). Indications:    Bacteremia R78.81  History:        Patient has prior history of Echocardiogram examinations, most                 recent 02/01/2023. Signs/Symptoms:Hypertensive Heart Disease.  Sonographer:    Nathanel Devonshire Referring Phys: [id number] MICHAEL A MOORE IMPRESSIONS  1. Left ventricular ejection fraction, by estimation, is 55 to 60%. Left ventricular ejection fraction by 2D MOD biplane is 59.5 %. The left ventricle has normal function. The left  ventricle has no regional wall motion abnormalities. Left ventricular diastolic parameters were normal.  2. Right ventricular systolic function is normal. The right ventricular size is normal. There is normal pulmonary artery systolic pressure.  3. The mitral valve is normal in structure. No evidence of mitral valve regurgitation. No evidence of mitral stenosis.  4. The aortic valve is normal in structure. Aortic valve regurgitation is not visualized. No aortic stenosis is present. Comparison(s): No significant change from prior study. FINDINGS  Left Ventricle: Left ventricular ejection fraction, by estimation, is 55 to 60%. Left ventricular ejection fraction by 2D MOD biplane is 59.5 %. The left ventricle has normal function. The left ventricle has no  regional wall motion abnormalities. The left ventricular internal cavity size was normal in size. There is no left ventricular hypertrophy. Left ventricular diastolic parameters were normal. Right Ventricle: The right ventricular size is normal. No increase in right ventricular wall thickness. Right ventricular systolic function is normal. There is normal pulmonary artery systolic pressure. The tricuspid regurgitant velocity is 2.71 m/s, and  with an assumed right atrial pressure of 3 mmHg, the estimated right ventricular systolic pressure is 32.4 mmHg. Left Atrium: Left atrial size was normal in size. Right Atrium: Right atrial size was normal in size. Pericardium: There is no evidence of pericardial effusion. Mitral Valve: The mitral valve is normal in structure. No evidence of mitral valve regurgitation. No evidence of mitral valve stenosis. Tricuspid Valve: The tricuspid valve is normal in structure. Tricuspid valve regurgitation is not demonstrated. No evidence of tricuspid stenosis. Aortic Valve: The aortic valve is normal in structure. Aortic valve regurgitation is not visualized. No aortic stenosis is present. Aortic valve mean gradient measures 3.0 mmHg.  Aortic valve peak gradient measures 7.0 mmHg. Aortic valve area, by VTI measures 3.03 cm. Pulmonic Valve: The pulmonic valve was not well visualized. Pulmonic valve regurgitation is not visualized. No evidence of pulmonic stenosis. Aorta: The aortic root is normal in size and structure. Venous: The inferior vena cava was not well visualized. IAS/Shunts: The interatrial septum was not well visualized.  LEFT VENTRICLE PLAX 2D                        Biplane EF (MOD) LVIDd:         5.10 cm         LV Biplane EF:   Left LVIDs:         3.40 cm                          ventricular LV PW:         1.00 cm                          ejection LV IVS:        0.90 cm                          fraction by LVOT diam:     2.10 cm                          2D MOD LV SV:         80                               biplane is LV SV Index:   37                               59.5 %. LVOT Area:     3.46 cm LV IVRT:       77 msec         Diastology                                LV e' medial:    9.25 cm/s  LV E/e' medial:  9.7 LV Volumes (MOD)               LV e' lateral:   9.14 cm/s LV vol d, MOD    93.1 ml       LV E/e' lateral: 9.8 A2C: LV vol d, MOD    68.6 ml A4C: LV vol s, MOD    33.9 ml A2C: LV vol s, MOD    29.5 ml A4C: LV SV MOD A2C:   59.2 ml LV SV MOD A4C:   68.6 ml LV SV MOD BP:    47.4 ml RIGHT VENTRICLE RV Basal diam:  3.50 cm     PULMONARY VEINS RV S prime:     17.10 cm/s  Diastolic Velocity: 29.00 cm/s TAPSE (M-mode): 2.7 cm      S/D Velocity:       1.60                             Systolic Velocity:  45.30 cm/s LEFT ATRIUM             Index        RIGHT ATRIUM           Index LA diam:        3.80 cm 1.77 cm/m   RA Area:     17.70 cm LA Vol (A2C):   44.5 ml 20.67 ml/m  RA Volume:   48.80 ml  22.67 ml/m LA Vol (A4C):   37.2 ml 17.28 ml/m LA Biplane Vol: 40.7 ml 18.91 ml/m  AORTIC VALVE AV Area (Vmax):    2.91 cm AV Area (Vmean):   3.16 cm AV Area (VTI):     3.03 cm AV Vmax:           132.00  cm/s AV Vmean:          78.000 cm/s AV VTI:            0.264 m AV Peak Grad:      7.0 mmHg AV Mean Grad:      3.0 mmHg LVOT Vmax:         111.00 cm/s LVOT Vmean:        71.200 cm/s LVOT VTI:          0.231 m LVOT/AV VTI ratio: 0.88  AORTA Ao Root diam: 3.50 cm Ao Asc diam:  2.90 cm MITRAL VALVE               TRICUSPID VALVE MV Area (PHT): 3.34 cm    TR Peak grad:   29.4 mmHg MV Decel Time: 227 msec    TR Vmax:        271.00 cm/s MV E velocity: 89.50 cm/s MV A velocity: 93.40 cm/s  SHUNTS MV E/A ratio:  0.96        Systemic VTI:  0.23 m                            Systemic Diam: 2.10 cm Joelle Cedars Tonleu Electronically signed by Joelle Cedars Tonleu Signature Date/Time: 11/01/2024/10:35:36 AM    Final       Vernal Alstrom, MD  [facility] 11/02/2024  If 7PM-7AM, please contact night-coverage

## 2024-11-02 NOTE — Progress Notes (Signed)
 [facility]  Date of Admission:  10/29/2024   Total days of inpatient antibiotics 4  Principal Problem:   Septic arthritis 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  Received steroid injection in the outpatient separate setting.  Supposed to get outpatient MRI, advised to go to ED given significant have pain. - Blood cultures from admission grew 2 out of 2 sets MSSA - MRI right hip showed large right joint effusion and severe muscle edema in right gluteus minus, small abscess right gluteus minimus measuring 5.9 X0.9X 3.5 cm concern for septic arthritis/myositis.  Underwent aspiration with IR on 12/13 with 111 K WBC, 84% neutrophils, 1 9 Chrismon lymphocytes, 7% lymphs.  Orthopedics engaged patient taken to the OR Taken to the OR with orthopedics on 12/14 for right hip I&D for septic arthritis.  Or findings notable for capsules distended, purulent material came out out of the capsule laparotomy at time of incision. Cx + stpah aureus    Recommendations: -CT head, pt answered question approprietly. Discussed with wife at bedside pt's waxing ad waning mental status could be some delirium, but will order ct head to be complete. Tmax 100.7 overnight.  -tee pending -Follow repeat blood cultures to ensure clearance. OR cx + staph aureus( suspect mssa) -follow cbc/cmp - Continue cefazolin  - Standard precautions -plan communicated to primary   Microbiology:   Antibiotics: Cefazolin  12/13-present Cultures: Blood 12/12 2 out of 2 sets of MSSA 12/14 Urine   Other 12/14 OR cultures with staph aureus  SUBJECTIVE: Resting in bed. Wife at bedside Interval:  Tmax 100.7 ovvernight  Review of Systems: Review of Systems  All other systems reviewed and are negative.    Scheduled Meds:  acetaminophen   1,000 mg Oral TID   cholecalciferol   1,000 Units Oral Daily   enoxaparin  (LOVENOX ) injection  40 mg Subcutaneous Q24H   gabapentin   300 mg Oral TID   losartan   50 mg Oral Daily   And   hydrochlorothiazide   12.5 mg Oral Daily   lidocaine   1 patch Transdermal Q24H   methocarbamol   500 mg Oral TID   multivitamin with minerals  1 tablet Oral QHS   polyethylene glycol  17 g Oral BID   senna-docusate  2 tablet Oral BID   simvastatin   40 mg Oral QPM   terbinafine   250 mg Oral Q Mon   Continuous Infusions:   ceFAZolin  (ANCEF ) IV 2 g (11/02/24 0855)   PRN Meds:.acetaminophen , oxyCODONE  Allergies[1]  OBJECTIVE: Vitals:   11/01/24 2031 11/02/24 0022 11/02/24 0516 11/02/24 0756  BP: (!) 101/55 (!) 105/53 113/63 (!) 113/52  Pulse: 63 64 73 71  Resp: 18 18 16 17   Temp: 98.3 F (36.8 C) (!) 97.5 F (36.4 C) (!) 100.7 F (38.2 C) 99.4 F (37.4 C)  TempSrc: Oral     SpO2: 92% 98% 96% 97%   There is no height or weight on file to calculate BMI.  Physical Exam Constitutional:      General: He is not in acute distress.    Appearance: He is normal weight. He is not toxic-appearing.  HENT:     Head: Normocephalic  and atraumatic.     Right Ear: External ear normal.     Left Ear: External ear normal.     Nose: No congestion or rhinorrhea.     Mouth/Throat:     Mouth: Mucous membranes are moist.     Pharynx: Oropharynx is clear.  Eyes:     Extraocular Movements: Extraocular movements intact.     Conjunctiva/sclera: Conjunctivae normal.     Pupils: Pupils are equal, round, and reactive to light.  Cardiovascular:     Rate and Rhythm: Normal rate and regular rhythm.     Heart sounds: No murmur heard.    No friction rub. No gallop.  Pulmonary:     Effort: Pulmonary effort is normal.     Breath sounds: Normal breath sounds.  Abdominal:      General: Abdomen is flat. Bowel sounds are normal.     Palpations: Abdomen is soft.  Musculoskeletal:     Cervical back: Normal range of motion and neck supple.  Skin:    General: Skin is warm and dry.  Neurological:     General: No focal deficit present.  Psychiatric:        Mood and Affect: Mood normal.       Lab Results Lab Results  Component Value Date   WBC 14.9 (H) 11/01/2024   HGB 13.2 11/01/2024   HCT 37.8 (L) 11/01/2024   MCV 92.4 11/01/2024   PLT 165 11/01/2024    Lab Results  Component Value Date   CREATININE 0.88 10/31/2024   BUN 25 (H) 10/31/2024   NA 135 10/31/2024   K 3.5 10/31/2024   CL 99 10/31/2024   CO2 25 10/31/2024    Lab Results  Component Value Date   ALT 50 (H) 10/30/2024   AST 51 (H) 10/30/2024   ALKPHOS 103 10/30/2024   BILITOT 1.2 10/30/2024        Loney Stank, MD [facility] 11/02/2024, 12:34 PM Evaluation of this patient requires complex antimicrobial therapy evaluation and counseling + isolation needs for disease transmission risk assessment and mitigation      [1] No Known Allergies

## 2024-11-02 NOTE — Progress Notes (Signed)
 Orthopedic Surgery Progress Note   Assessment: Patient is a 73 y.o. male with right hip septic arthritis, right gluteus musculature myositis   S/p incision and drainage of right hip   Plan: -Orthopedic operative plans complete -Intraoperative culture showing Staph aureus  -Infectious disease consulted, appreciate their recommendations for antibiotic management -Okay for diet from orthopedic perspective -DVT ppx: aspirin  81mg  BID -Weight bearing status: as tolerated  -PT evaluate and treat -Pain control -Dispo: per primary  ___________________________________________________________________________  Subjective: Hip feeling better. Has tolerated being moved in better and the hip moving better. He feels today is even an improvement from yesterday. Is happy that his hip is finally making progress since it has been very pain full for over a week now. Has been needing less medication to control the pain.   Physical Exam:  General: no acute distress, appears stated age Neurologic: alert, answering questions appropriately, following commands Respiratory: unlabored breathing on room air, symmetric chest rise Psychiatric: appropriate affect, normal cadence to speech  MSK:    -Right lower extremity  Improved tolerance for passive range of motion at the hip  Weak hip flexion EHL/TA/GSC intact Plantarflexes and dorsiflexes toes Sensation intact to light touch in sural, saphenous, tibial, deep peroneal, and superficial peroneal nerve distributions Foot warm and well perfused   Yesterday's total administered Morphine  Milligram Equivalents: 7.5   Patient name: Duane Greene Patient MRN: [id number] Date: 11/02/2024

## 2024-11-02 NOTE — Care Management Important Message (Signed)
 Important Message  Patient Details  Name: Duane Greene MRN: [id number] Date of Birth: 1951-05-02   Important Message Given:  Yes - Medicare IM     Jennie Laneta Dragon 11/02/2024, 12:47 PM

## 2024-11-02 NOTE — Progress Notes (Signed)
 Physical Therapy Treatment Patient Details Name: Duane Greene MRN: [id number] DOB: [birth year] Today's Date: 11/02/2024   History of Present Illness Duane Greene is a 73 y.o. male who p/w R hip joint swelling and imaging/arthrocentesis c/f R hip septic arthritis. Past medical history significant of HTN, HLD, OSA (on CPAP), and OA b/l hips s/p US  guided steroid injection on 12/2.    PT Comments  Pt remains eager and motivated to participate. Pt requiring mod-max assist for bed mobility. Worked on transition to standing again from edge of bed to RW with emphasis on anterior weight shift. Once in standing, performed pre gait training I.e. weight shifting and marching. Further mobility limited due to arrival of transport to take pt to CT. Patient will benefit from intensive inpatient follow-up therapy, >3 hours/day.    If plan is discharge home, recommend the following: A lot of help with walking and/or transfers;A lot of help with bathing/dressing/bathroom   Can travel by private vehicle        Equipment Recommendations  Wheelchair (measurements PT);Wheelchair cushion (measurements PT);Hospital bed    Recommendations for Other Services       Precautions / Restrictions Precautions Precautions: Fall Recall of Precautions/Restrictions: Intact Restrictions Weight Bearing Restrictions Per Provider Order: Yes RLE Weight Bearing Per Provider Order: Weight bearing as tolerated     Mobility  Bed Mobility Overal bed mobility: Needs Assistance Bed Mobility: Supine to Sit, Sit to Supine     Supine to sit: Max assist Sit to supine: Mod assist   General bed mobility comments: Assist for RLE management and heavy trunk assist to sit upright. Pt slightly resistive and guarding away from pain. Step by step cueing provided. BLE elevation back into bed    Transfers Overall transfer level: Needs assistance Equipment used: Rolling walker (2 wheels), Ambulation equipment used Transfers: Sit  to/from Stand Sit to Stand: Mod assist, From elevated surface           General transfer comment: ModA to power up to stand from edge of bed. Worked on lateral weight shifting x 10 and marching in place x 5    Ambulation/Gait                   Stairs             Wheelchair Mobility     Tilt Bed    Modified Rankin (Stroke Patients Only)       Balance Overall balance assessment: Needs assistance Sitting-balance support: Single extremity supported, Feet supported Sitting balance-Leahy Scale: Fair     Standing balance support: Bilateral upper extremity supported, During functional activity, Reliant on assistive device for balance Standing balance-Leahy Scale: Poor Standing balance comment: Dependent on RW and external support                            Communication Communication Communication: No apparent difficulties  Cognition Arousal: Alert Behavior During Therapy: WFL for tasks assessed/performed   PT - Cognitive impairments: No apparent impairments                         Following commands: Intact      Cueing Cueing Techniques: Verbal cues, Tactile cues, Visual cues  Exercises General Exercises - Lower Extremity Quad Sets: AROM, Both, 10 reps, Supine Short Arc Quad: AROM, Right, 15 reps, Supine Hip ABduction/ADduction: AAROM, Right, 5 reps, Supine    General Comments  Pertinent Vitals/Pain Pain Assessment Pain Assessment: Faces Faces Pain Scale: Hurts even more Pain Location: R hip Pain Descriptors / Indicators: Discomfort, Grimacing, Guarding Pain Intervention(s): Limited activity within patient's tolerance, Monitored during session    Home Living                          Prior Function            PT Goals (current goals can now be found in the care plan section) Progress towards PT goals: Progressing toward goals    Frequency    Min 3X/week      PT Plan      Co-evaluation               AM-PAC PT 6 Clicks Mobility   Outcome Measure  Help needed turning from your back to your side while in a flat bed without using bedrails?: A Lot Help needed moving from lying on your back to sitting on the side of a flat bed without using bedrails?: A Lot Help needed moving to and from a bed to a chair (including a wheelchair)?: A Lot Help needed standing up from a chair using your arms (e.g., wheelchair or bedside chair)?: A Lot Help needed to walk in hospital room?: Total Help needed climbing 3-5 steps with a railing? : Total 6 Click Score: 10    End of Session Equipment Utilized During Treatment: Gait belt Activity Tolerance: Patient tolerated treatment well Patient left: with call bell/phone within reach;in bed;with bed alarm set Nurse Communication: Mobility status PT Visit Diagnosis: Other abnormalities of gait and mobility (R26.89)     Time: 8585-8550 PT Time Calculation (min) (ACUTE ONLY): 35 min  Charges:    $Therapeutic Activity: 23-37 mins PT General Charges $$ ACUTE PT VISIT: 1 Visit                     Aleck Daring, PT, DPT Acute Rehabilitation Services Office [phone]    Aleck ONEIDA Daring 11/02/2024, 4:25 PM

## 2024-11-02 NOTE — Progress Notes (Signed)
 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  Received steroid injection in the outpatient separate setting.  Supposed to get outpatient MRI, advised to go to ED given significant have pain. - Blood cultures from admission grew 2 out of 2 sets MSSA - MRI right hip showed large right joint effusion and severe muscle edema in right gluteus minus, small abscess right gluteus minimus measuring 5.9 X0.9X 3.5 cm concern for septic arthritis/myositis.  Underwent aspiration with IR on 12/13 with 111 K WBC, 84% neutrophils, 1 9 Chrismon lymphocytes, 7% lymphs.  Orthopedics engaged patient taken to the OR Taken to the OR with orthopedics on 12/14 for right hip I&D for septic arthritis.  Or findings notable for capsules distended, purulent material came out out of the capsule laparotomy at time of incision  Recommendations: -Follow repeat blood cultures to ensure clearance - Follow OR cultures GPC on Gram stain so far - TTE being done while in room, will need TEE - Continue cefazolin  - Standard precautions   Microbiology:   Antibiotics: Cefazolin  12/13-present Cultures: Blood 12/12 2 out of 2 sets of MSSA 12/14 Urine  Other 12/14 OR cultures with Gram stain rare GPC  SUBJECTIVE: Resting in bed.  Wife at bedside. Interval: Afebrile overnight.  Review of Systems: Review of Systems  All other systems reviewed and are negative.    Scheduled Meds:  acetaminophen   1,000 mg Oral TID   cholecalciferol   1,000 Units Oral Daily   enoxaparin  (LOVENOX ) injection  40 mg  Subcutaneous Q24H   gabapentin   300 mg Oral TID   losartan   50 mg Oral Daily   And   hydrochlorothiazide   12.5 mg Oral Daily   lidocaine   1 patch Transdermal Q24H   methocarbamol   500 mg Oral TID   multivitamin with minerals  1 tablet Oral QHS   polyethylene glycol  17 g Oral BID   senna-docusate  2 tablet Oral BID   simvastatin   40 mg Oral QPM   terbinafine   250 mg Oral Q Mon   Continuous Infusions:   ceFAZolin  (ANCEF ) IV 2 g (11/02/24 0220)   PRN Meds:.acetaminophen , oxyCODONE  Allergies[1]  OBJECTIVE: Vitals:   11/01/24 1322 11/01/24 2031 11/02/24 0022 11/02/24 0516  BP: (!) 103/57 (!) 101/55 (!) 105/53 113/63  Pulse: 71 63 64 73  Resp: 19 18 18 16   Temp: 98 F (36.7 C) 98.3 F (36.8 C) (!) 97.5 F (36.4 C) (!) 100.7 F (38.2 C)  TempSrc:  Oral    SpO2: 97% 92% 98% 96%   There is no height or weight on file to calculate BMI.  Physical Exam Constitutional:      General: He is not in acute distress.    Appearance: He is normal weight. He is not toxic-appearing.  HENT:     Head: Normocephalic and atraumatic.     Right Ear: External ear normal.     Left Ear: External ear normal.     Nose: No congestion or rhinorrhea.     Mouth/Throat:     Mouth: Mucous  membranes are moist.     Pharynx: Oropharynx is clear.  Eyes:     Extraocular Movements: Extraocular movements intact.     Conjunctiva/sclera: Conjunctivae normal.     Pupils: Pupils are equal, round, and reactive to light.  Cardiovascular:     Rate and Rhythm: Normal rate and regular rhythm.     Heart sounds: No murmur heard.    No friction rub. No gallop.  Pulmonary:     Effort: Pulmonary effort is normal.     Breath sounds: Normal breath sounds.  Abdominal:     General: Abdomen is flat. Bowel sounds are normal.     Palpations: Abdomen is soft.  Musculoskeletal:        General: No swelling. Normal range of motion.     Cervical back: Normal range of motion and neck supple.  Skin:    General: Skin is warm  and dry.  Neurological:     General: No focal deficit present.     Mental Status: He is oriented to person, place, and time.  Psychiatric:        Mood and Affect: Mood normal.       Lab Results Lab Results  Component Value Date   WBC 14.9 (H) 11/01/2024   HGB 13.2 11/01/2024   HCT 37.8 (L) 11/01/2024   MCV 92.4 11/01/2024   PLT 165 11/01/2024    Lab Results  Component Value Date   CREATININE 0.88 10/31/2024   BUN 25 (H) 10/31/2024   NA 135 10/31/2024   K 3.5 10/31/2024   CL 99 10/31/2024   CO2 25 10/31/2024    Lab Results  Component Value Date   ALT 50 (H) 10/30/2024   AST 51 (H) 10/30/2024   ALKPHOS 103 10/30/2024   BILITOT 1.2 10/30/2024        Loney Stank, MD [facility] 11/02/2024, 6:10 AM Evaluation of this patient requires complex antimicrobial therapy evaluation and counseling + isolation needs for disease transmission risk assessment and mitigation      [1] No Known Allergies

## 2024-11-02 NOTE — Op Note (Signed)
 Orthopedic Surgery Operative Report   Procedure: Right hip incision and drainage for septic arthritis   Modifier: none   Date of procedure: 10/31/2024   Patient name: Duane Greene MRN: [id number] DOB: Mar 07, 1951   Surgeon: Ozell Ada, MD Assistant: none Pre-operative diagnosis: right hip septic arthritis Post-operative diagnosis: same as above Findings: capsule was distended, purulent material came out of capsulotomy at time of incision   Specimens: none Anesthesia: general EBL: 20cc Complications: none Pre-incision: patient was on scheduled antibiotics TXA was given prior to incision   Implants: none     Indication for procedure: Patient is a 73 year old male who had been having hip pain for several weeks.  Patient had received a steroid injection in the outpatient setting.  He was supposed to get a outpatient MRI as well to workup this hip pain further.  His hip pain got worse within the last week.  He was having difficulty mobilizing and was mostly chair bound.  He eventually was advised to come to the ER and presented with significant hip pain.  He was unable to tolerate passive range of motion.  The ER ordered and a MRI.  MRI showed significant effusion within the hip joint and T2 signal within the right gluteus musculature concerning for myositis.  The ER had already consulted radiology for aspiration of right hip joint.  After that consultation, orthopedics was consulted to evaluate the patient.  By the time of my examination, his aspiration results noted come back with 110K cells.  He had significant pain through range of motion at the hip.  Given this aspirate result and the clinical exam, I recommended incision and drainage of the hip joint to treat a septic arthritis.  The risks, benefits, alternatives of this proposed procedure were covered with the patient. All his questions were answered to his satisfaction. After this conversation, patient elected to proceed.     Procedure Description: The patient was met in the pre-operative holding area. The patient's identity and consent were verified. The operative site was marked. The patient's remaining questions about surgery were answered. The patient was brought back to the operating room. General anesthesia was induced and an endotracheal tube was placed by the anesthesia staff. The patient was transferred to the operating table in the lateral position on a peg board with the operative hip up. An axillary roll was placed in the axilla of the down arm. Pegs were placed to secure the patient in the lateral position. All bony prominences were well padded. The surgical area was cleansed with chlorhexidine  scrub brush and then alcohol. Patient was on scheduled antibiotics. 1g of TXA was also given before incision. The patient's skin was then prepped and draped in a standard, sterile fashion. A time out was performed that identified the patient, the procedure, and the laterality. All team members agreed with what was stated in the time out.    An incision was made from the base of the greater trochanter to the buttock. This was incision was gently curved posteriorly and centered over the posterior 1/3 of the greater trochanter. Incision was taken sharply down through skin and dermis. Electrocautery was used to dissect through the subcutaneous fat. The fascia was divided in line with the skin incision.The leg was internally rotated and a deaver was placed under the gluteus medius. The short external rotator tendons were observed inserting around the greater trochanter. Electrocautery was used to cut the piriformis muscle. It was not take down more distal than that.  I then used the tendon to mobilize the piriformis laterally. The remaining soft tissue overlying the capsule was bluntly dissected away with a elevator. The capsule was distended. A capsulotomy was made. Frank pus was seen coming out of the capsulotomy immediately. This was  collected into a syringe. The capsulotomy was extended slightly enough to visualize in the joint and irrigated through the joint. 3L of sterile saline was then irrigated through the joint. The hip joint was manipulated as it was irrigated. Further purulent material was seen in the irrigation initially. By the end no further purulent material was seen. Vancomycin  powder was placed into the wound. The capsulotomy was reapproximated with 0 PDS. The fascia was reapproximated with 0 PDS. The deep dermal layer was closed with 2-0 PDS. The skin was closed with staples. Aquacel dressing was applied. All counts were correct at the end of the case. The patient was transferred back to a hospital bed. The patient was extubated and brought back to the post anesthesia care unit in stable condition.    Post-operative plan: The patient will recover in the post-anesthesia care unit and then go to the floor. His intraoperative cultures will be followed to determine antibiotic management. He will continued with his scheduled antibiotics for now. The patient will be weight bearing as tolerated. The patient will work with physical therapy. The patient's disposition will be determined by the medicine service.     Ozell Ada, MD Orthopedic Surgeon

## 2024-11-02 NOTE — Consult Note (Signed)
 Physical Medicine and Rehabilitation Consult Reason for Consult: Impaired functional mobility Referring Physician: Pokhrel   HPI: Duane Greene is a 73 y.o. male with a history of hypertension, obstructive sleep apnea on CPAP, bilateral OA of the hips who had recent steroid injections in early December.  He presented to the hospital on 10/30/2024 with right hip swelling and warmth.  Workup notable for low-grade temperature as well as leukocytosis and elevated inflammatory markers.  Orthopedic surgery was consulted and the patient underwent an I&D of his right hip on 10/31/2024.  Intraoperative cultures demonstrated gram-positive cocci.  ID was consulted and recommended TEE which showed no signs of valve vegetation.  Patient remains on IV cefazolin  for Staph aureus.  Patient has been up with therapy and yesterday was mod assist for sit to stand.  He did not ambulate.  Gait trials pending for today.  Patient was independent and driving prior to this admission.  He lives with his spouse in a 1 level house with 3 steps to enter.    Home: Home Living Family/patient expects to be discharged to:: Private residence Living Arrangements: Spouse/significant other Available Help at Discharge: Family, Available 24 hours/day Type of Home: House Home Access: Stairs to enter Secretary/administrator of Steps: 3 Entrance Stairs-Rails: None Home Layout: One level Bathroom Shower/Tub: Health Visitor: Handicapped height Bathroom Accessibility: No Home Equipment: Agricultural Consultant (2 wheels), [employer] - single point, Information systems manager - built in, [employer] held shower head, BSC/3in1  Functional History: Prior Function Prior Level of Function : Independent/Modified Independent, Driving Mobility Comments: Prior to last week, pt was ind with no AD however since then has been barely mobile with RW due to hip pain. ADLs Comments: Ind prior to last week Functional Status:  Mobility: Bed Mobility Overal  bed mobility: Needs Assistance Bed Mobility: Supine to Sit Supine to sit: Mod assist, +2 for physical assistance, +2 for safety/equipment, HOB elevated, Used rails Sit to supine: Mod assist General bed mobility comments: Mod A +2 to come to EOB. Increased assistance to manage RLE off of bed. Transfers Overall transfer level: Needs assistance Equipment used: Rolling walker (2 wheels) Transfers: Sit to/from Stand, Bed to chair/wheelchair/BSC Sit to Stand: Mod assist, +2 physical assistance, +2 safety/equipment, From elevated surface Bed to/from chair/wheelchair/BSC transfer type:: Via Lift equipment Step pivot transfers: Mod assist, +2 physical assistance, +2 safety/equipment General transfer comment: Mod A +2 and verbal cues for proper hand placement on RW to rise from bed slightly elevated. Pt able to step pivot to recliner on L side with Mod A +2. Increased time and occassional verbal cue required. Ambulation/Gait General Gait Details: Unable due to pain    ADL: ADL Overall ADL's : Needs assistance/impaired Eating/Feeding: Set up, Sitting Grooming: Wash/dry face, Set up, Sitting Upper Body Bathing: Minimal assistance, Sitting Lower Body Bathing: Maximal assistance Upper Body Dressing : Set up, Sitting Lower Body Dressing: Maximal assistance Toilet Transfer: Moderate assistance, +2 for physical assistance, +2 for safety/equipment, BSC/3in1, Rolling walker (2 wheels) Toileting- Clothing Manipulation and Hygiene: Total assistance Toileting - Clothing Manipulation Details (indicate cue type and reason): external catheter  Cognition: Cognition Orientation Level: Oriented X4 Cognition Arousal: Alert Behavior During Therapy: WFL for tasks assessed/performed   Review of Systems  Constitutional:  Positive for fever.  HENT: Negative.    Eyes: Negative.   Respiratory: Negative.    Cardiovascular: Negative.   Gastrointestinal: Negative.   Genitourinary:  Negative for dysuria.   Musculoskeletal:  Positive for back  pain, joint pain and myalgias.  Skin: Negative.   Neurological:  Positive for focal weakness and weakness. Negative for sensory change.  Psychiatric/Behavioral:  Negative for depression.    Past Medical History:  Diagnosis Date   Arthritis    History of kidney stones    Hyperlipidemia    Hypertension    Right inguinal hernia    Sleep apnea    uses CPAP nightly   Past Surgical History:  Procedure Laterality Date   CHOLECYSTECTOMY N/A 06/13/2020   Procedure: LAPAROSCOPIC CHOLECYSTECTOMY;  Surgeon: Belinda Cough, MD;  Location: [facility] OR;  Service: General;  Laterality: N/A;   COLONOSCOPY     EXTRACORPOREAL SHOCK WAVE LITHOTRIPSY Left 10/18/2019   Procedure: EXTRACORPOREAL SHOCK WAVE LITHOTRIPSY (ESWL);  Surgeon: Renda Glance, MD;  Location: [facility] ORS;  Service: Urology;  Laterality: Left;   INCISION AND DRAINAGE OF WOUND Right 10/31/2024   Procedure: IRRIGATION AND DEBRIDEMENT WOUND;  Surgeon: Georgina Ozell LABOR, MD;  [facility];  Service: Orthopedics;  Laterality: Right;   INGUINAL HERNIA REPAIR Right 06/30/2019   Procedure: RIGHT INGUINAL HERNIA REPAIR WITH MESH;  Surgeon: Belinda Cough, MD;  Location: [facility];  Service: General;  Laterality: Right;   INTRAOPERATIVE CHOLANGIOGRAM N/A 06/13/2020   Procedure: INTRAOPERATIVE CHOLANGIOGRAM;  Surgeon: Belinda Cough, MD;  Location: [facility] OR;  Service: General;  Laterality: N/A;   KNEE ARTHROSCOPY Left    LUMBAR LAMINECTOMY/DECOMPRESSION MICRODISCECTOMY N/A 08/28/2020   Procedure: Laminectomy - Lumbar One-Two, Lumbar Two-Three, Lumbar Three-Four, Lumbar Four-Five, Lumbar Five-Sacral One;  Surgeon: Joshua Alm RAMAN, MD;  Location: [facility] OR;  Service: Neurosurgery;  Laterality: N/A;  posterior   REVERSE SHOULDER ARTHROPLASTY Right 09/06/2022   Procedure: REVERSE Right SHOULDER ARTHROPLASTY;  Surgeon: Kay Kemps, MD;  Location: [facility] ORS;  Service: Orthopedics;  Laterality: Right;  120 hours choice  and interscalene block   SHOULDER ARTHROSCOPY Bilateral    TONSILLECTOMY     Family History  Problem Relation Age of Onset   Heart Problems Father    CVA Father    Social History:  reports that he has never smoked. He has never used smokeless tobacco. He reports that he does not drink alcohol and does not use drugs. Allergies: Allergies[1] Medications Prior to Admission  Medication Sig Dispense Refill   aspirin  EC 81 MG tablet Take 81 mg by mouth at bedtime. Swallow whole.     cholecalciferol  (VITAMIN D3) 25 MCG (1000 UNIT) tablet Take 1,000 Units by mouth daily.     gabapentin  (NEURONTIN ) 300 MG capsule Take 1 capsule (300 mg total) by mouth at bedtime. 90 capsule 3   losartan -hydrochlorothiazide  (HYZAAR ) 50-12.5 MG tablet Take 1 tablet by mouth daily. 100 tablet 3   Multiple Vitamins-Minerals (MULTIVITAMIN WITH MINERALS) tablet Take 1 tablet by mouth at bedtime.     oxyCODONE  (OXY IR/ROXICODONE ) 5 MG immediate release tablet Take 1 tablet (5 mg total) by mouth every 6 (six) hours as needed for pain for 7 days. 28 tablet 0   simvastatin  (ZOCOR ) 40 MG tablet Take 1 tablet (40 mg total) by mouth every evening. (Patient taking differently: Take 40 mg by mouth at bedtime.) 100 tablet 3   terbinafine  (LAMISIL ) 250 MG tablet Take 1 tablet (250 mg total) by mouth every 7 (seven) days to prevent onychomycosis. (Patient taking differently: Take 250 mg by mouth every Monday.) 12 tablet 2   triamcinolone  cream (KENALOG ) 0.1 % Apply 1 Application topically daily as needed. 15 g 3     Blood pressure ROLLEN)  113/52, pulse 71, temperature 99.4 F (37.4 C), resp. rate 17, SpO2 97%. Physical Exam Constitutional:      General: He is not in acute distress. HENT:     Head: Normocephalic.     Right Ear: External ear normal.     Left Ear: External ear normal.     Nose: Nose normal.  Eyes:     Extraocular Movements: Extraocular movements intact.     Conjunctiva/sclera: Conjunctivae normal.   Cardiovascular:     Rate and Rhythm: Normal rate.  Pulmonary:     Effort: Pulmonary effort is normal.  Abdominal:     Palpations: Abdomen is soft.  Musculoskeletal:        General: Swelling and tenderness (Right hip and thigh) present.     Cervical back: Normal range of motion.     Right lower leg: Edema present.  Skin:    General: Skin is warm.  Neurological:     Mental Status: He is alert.     Comments: Alert and oriented x 3. Normal insight and awareness. Intact Memory. Normal language and speech. Cranial nerve exam unremarkable. MMT: BUE 4+ to 5/5. RLE limited by pain proximally, 4/5 ADF/PF.LLE 3/5 HF, KE and 4+ to 5/5 ADF/PF. SABRA    Psychiatric:        Mood and Affect: Mood normal.        Behavior: Behavior normal.     Results for orders placed or performed during the hospital encounter of 10/29/24 (from the past 24 hours)  Glucose, capillary     Status: Abnormal   Collection Time: 11/02/24  5:59 AM  Result Value Ref Range   Glucose-Capillary 149 (H) 70 - 99 mg/dL   ECHOCARDIOGRAM COMPLETE Result Date: 11/01/2024    ECHOCARDIOGRAM REPORT   Patient Name:   Kellis L Clavin Date of Exam: 11/01/2024 Medical Rec #:  [id number]      Height:       70.0 in Accession #:    [id number]     Weight:       215.0 lb Date of Birth:  1951-07-30      BSA:          2.152 m Patient Age:    73 years       BP:           105/51 mmHg Patient Gender: M              HR:           65 bpm. Exam Location:  Inpatient Procedure: 2D Echo, Cardiac Doppler and Color Doppler (Both Spectral and Color            Flow Doppler were utilized during procedure). Indications:    Bacteremia R78.81  History:        Patient has prior history of Echocardiogram examinations, most                 recent 02/01/2023. Signs/Symptoms:Hypertensive Heart Disease.  Sonographer:    Nathanel Devonshire Referring Phys: [id number] MICHAEL A MOORE IMPRESSIONS  1. Left ventricular ejection fraction, by estimation, is 55 to 60%. Left ventricular ejection  fraction by 2D MOD biplane is 59.5 %. The left ventricle has normal function. The left ventricle has no regional wall motion abnormalities. Left ventricular diastolic parameters were normal.  2. Right ventricular systolic function is normal. The right ventricular size is normal. There is normal pulmonary artery systolic pressure.  3. The mitral valve is normal in structure. No evidence of  mitral valve regurgitation. No evidence of mitral stenosis.  4. The aortic valve is normal in structure. Aortic valve regurgitation is not visualized. No aortic stenosis is present. Comparison(s): No significant change from prior study. FINDINGS  Left Ventricle: Left ventricular ejection fraction, by estimation, is 55 to 60%. Left ventricular ejection fraction by 2D MOD biplane is 59.5 %. The left ventricle has normal function. The left ventricle has no regional wall motion abnormalities. The left ventricular internal cavity size was normal in size. There is no left ventricular hypertrophy. Left ventricular diastolic parameters were normal. Right Ventricle: The right ventricular size is normal. No increase in right ventricular wall thickness. Right ventricular systolic function is normal. There is normal pulmonary artery systolic pressure. The tricuspid regurgitant velocity is 2.71 m/s, and  with an assumed right atrial pressure of 3 mmHg, the estimated right ventricular systolic pressure is 32.4 mmHg. Left Atrium: Left atrial size was normal in size. Right Atrium: Right atrial size was normal in size. Pericardium: There is no evidence of pericardial effusion. Mitral Valve: The mitral valve is normal in structure. No evidence of mitral valve regurgitation. No evidence of mitral valve stenosis. Tricuspid Valve: The tricuspid valve is normal in structure. Tricuspid valve regurgitation is not demonstrated. No evidence of tricuspid stenosis. Aortic Valve: The aortic valve is normal in structure. Aortic valve regurgitation is not  visualized. No aortic stenosis is present. Aortic valve mean gradient measures 3.0 mmHg. Aortic valve peak gradient measures 7.0 mmHg. Aortic valve area, by VTI measures 3.03 cm. Pulmonic Valve: The pulmonic valve was not well visualized. Pulmonic valve regurgitation is not visualized. No evidence of pulmonic stenosis. Aorta: The aortic root is normal in size and structure. Venous: The inferior vena cava was not well visualized. IAS/Shunts: The interatrial septum was not well visualized.  LEFT VENTRICLE PLAX 2D                        Biplane EF (MOD) LVIDd:         5.10 cm         LV Biplane EF:   Left LVIDs:         3.40 cm                          ventricular LV PW:         1.00 cm                          ejection LV IVS:        0.90 cm                          fraction by LVOT diam:     2.10 cm                          2D MOD LV SV:         80                               biplane is LV SV Index:   37                               59.5 %. LVOT Area:  3.46 cm LV IVRT:       77 msec         Diastology                                LV e' medial:    9.25 cm/s                                LV E/e' medial:  9.7 LV Volumes (MOD)               LV e' lateral:   9.14 cm/s LV vol d, MOD    93.1 ml       LV E/e' lateral: 9.8 A2C: LV vol d, MOD    68.6 ml A4C: LV vol s, MOD    33.9 ml A2C: LV vol s, MOD    29.5 ml A4C: LV SV MOD A2C:   59.2 ml LV SV MOD A4C:   68.6 ml LV SV MOD BP:    47.4 ml RIGHT VENTRICLE RV Basal diam:  3.50 cm     PULMONARY VEINS RV S prime:     17.10 cm/s  Diastolic Velocity: 29.00 cm/s TAPSE (M-mode): 2.7 cm      S/D Velocity:       1.60                             Systolic Velocity:  45.30 cm/s LEFT ATRIUM             Index        RIGHT ATRIUM           Index LA diam:        3.80 cm 1.77 cm/m   RA Area:     17.70 cm LA Vol (A2C):   44.5 ml 20.67 ml/m  RA Volume:   48.80 ml  22.67 ml/m LA Vol (A4C):   37.2 ml 17.28 ml/m LA Biplane Vol: 40.7 ml 18.91 ml/m  AORTIC VALVE AV Area (Vmax):     2.91 cm AV Area (Vmean):   3.16 cm AV Area (VTI):     3.03 cm AV Vmax:           132.00 cm/s AV Vmean:          78.000 cm/s AV VTI:            0.264 m AV Peak Grad:      7.0 mmHg AV Mean Grad:      3.0 mmHg LVOT Vmax:         111.00 cm/s LVOT Vmean:        71.200 cm/s LVOT VTI:          0.231 m LVOT/AV VTI ratio: 0.88  AORTA Ao Root diam: 3.50 cm Ao Asc diam:  2.90 cm MITRAL VALVE               TRICUSPID VALVE MV Area (PHT): 3.34 cm    TR Peak grad:   29.4 mmHg MV Decel Time: 227 msec    TR Vmax:        271.00 cm/s MV E velocity: 89.50 cm/s MV A velocity: 93.40 cm/s  SHUNTS MV E/A ratio:  0.96        Systemic VTI:  0.23 m  Systemic Diam: 2.10 cm Joelle Cedars Tonleu Electronically signed by Joelle Cedars Tonleu Signature Date/Time: 11/01/2024/10:35:36 AM    Final     Assessment/Plan: Diagnosis: 73 year old male with septic right hip status post I&D. Does the need for close, 24 hr/day medical supervision in concert with the patient's rehab needs make it unreasonable for this patient to be served in a less intensive setting? Yes Co-Morbidities requiring supervision/potential complications:  - Infectious disease considerations -Wound care -Pain control -Obstructive sleep apnea on CPAP -Essential hypertension Due to bladder management, bowel management, safety, skin/wound care, disease management, medication administration, pain management, and patient education, does the patient require 24 hr/day rehab nursing? Yes Does the patient require coordinated care of a physician, rehab nurse, therapy disciplines of PT, OT to address physical and functional deficits in the context of the above medical diagnosis(es)? Yes Addressing deficits in the following areas: balance, endurance, locomotion, strength, transferring, bowel/bladder control, bathing, dressing, feeding, grooming, toileting, and psychosocial support Can the patient actively participate in an intensive therapy program of  at least 3 hrs of therapy per day at least 5 days per week? Yes The potential for patient to make measurable gains while on inpatient rehab is excellent Anticipated functional outcomes upon discharge from inpatient rehab are modified independent  with PT, modified independent and supervision with OT, n/a with SLP. Estimated rehab length of stay to reach the above functional goals is: 10-14 days Anticipated discharge destination: Home Overall Rehab/Functional Prognosis: excellent  POST ACUTE RECOMMENDATIONS: This patient's condition is appropriate for continued rehabilitative care in the following setting: CIR Patient has agreed to participate in recommended program. Yes Note that insurance prior authorization may be required for reimbursement for recommended care.  Comment: Pt has supportive wife and has been motivated to work through pain. He and his wife were very active at home PTA.    I have personally performed a face to face diagnostic evaluation of this patient. Additionally, I have examined the patient's medical record including any pertinent labs and radiographic images.    Thanks,  Arthea ONEIDA Gunther, MD 11/02/2024      [1] No Known Allergies

## 2024-11-02 NOTE — Progress Notes (Signed)
° ° °  Inpatient Rehabilitation Admissions Coordinator   Met with patient and wife at bedside for rehab assessment. We discussed goals and expectations of a possible CIR admit. They prefer CIR for rehab. Family can provide expected caregiver support that is recommended . I will begin insurance Auth with [employer] for possible CIR admit pending approval. Dr Babs consulted. Please call me with any questions.   Heron Leavell, RN, MSN Rehab Admissions Coordinator [phone]

## 2024-11-03 ENCOUNTER — Inpatient Hospital Stay (HOSPITAL_COMMUNITY)

## 2024-11-03 ENCOUNTER — Inpatient Hospital Stay (HOSPITAL_COMMUNITY): Admitting: Anesthesiology

## 2024-11-03 ENCOUNTER — Encounter (HOSPITAL_COMMUNITY)
Admission: EM | Disposition: A | Discharge status: Rehabilitation Facility | Payer: Self-pay | Source: Home / Self Care | Attending: Hospitalist

## 2024-11-03 ENCOUNTER — Encounter (HOSPITAL_COMMUNITY): Payer: Self-pay | Admitting: Hospitalist

## 2024-11-03 DIAGNOSIS — I340 Nonrheumatic mitral (valve) insufficiency: Secondary | ICD-10-CM | POA: Diagnosis not present

## 2024-11-03 DIAGNOSIS — I10 Essential (primary) hypertension: Secondary | ICD-10-CM | POA: Diagnosis not present

## 2024-11-03 DIAGNOSIS — I342 Nonrheumatic mitral (valve) stenosis: Secondary | ICD-10-CM

## 2024-11-03 DIAGNOSIS — M00051 Staphylococcal arthritis, right hip: Secondary | ICD-10-CM | POA: Diagnosis not present

## 2024-11-03 DIAGNOSIS — R7881 Bacteremia: Principal | ICD-10-CM

## 2024-11-03 HISTORY — PX: TRANSESOPHAGEAL ECHOCARDIOGRAM (CATH LAB): EP1270

## 2024-11-03 LAB — BASIC METABOLIC PANEL WITH GFR
Anion gap: 8 (ref 5–15)
BUN: 23 mg/dL (ref 8–23)
CO2: 28 mmol/L (ref 22–32)
Calcium: 8.5 mg/dL — ABNORMAL LOW (ref 8.9–10.3)
Chloride: 103 mmol/L (ref 98–111)
Creatinine, Ser: 0.66 mg/dL (ref 0.61–1.24)
GFR, Estimated: >60 mL/min (ref >60)
Glucose, Bld: 105 mg/dL — ABNORMAL HIGH (ref 70–99)
Potassium: 4 mmol/L (ref 3.5–5.1)
Sodium: 139 mmol/L (ref 135–145)

## 2024-11-03 LAB — CBC
HCT: 37.8 % — ABNORMAL LOW (ref 39.0–52.0)
Hemoglobin: 13.1 g/dL (ref 13.0–17.0)
MCH: 32.8 pg (ref 26.0–34.0)
MCHC: 34.7 g/dL (ref 30.0–36.0)
MCV: 94.7 fL (ref 80.0–100.0)
Platelets: 248 K/uL (ref 150–400)
RBC: 3.99 MIL/uL — ABNORMAL LOW (ref 4.22–5.81)
RDW: 13.6 % (ref 11.5–15.5)
WBC: 10 K/uL (ref 4.0–10.5)
nRBC: 0 % (ref 0.0–0.2)

## 2024-11-03 LAB — ECHO TEE

## 2024-11-03 SURGERY — TRANSESOPHAGEAL ECHOCARDIOGRAM (TEE) (CATHLAB)
Anesthesia: Monitor Anesthesia Care

## 2024-11-03 MED ORDER — SODIUM CHLORIDE 0.9 % IV SOLN: INTRAVENOUS | Status: DC

## 2024-11-03 MED ORDER — PROPOFOL 500 MG/50ML IV EMUL
INTRAVENOUS | Status: DC | PRN
  Administered 2024-11-03: 12:00:00 100 ug/kg/min via INTRAVENOUS

## 2024-11-03 MED ORDER — PROPOFOL 10 MG/ML IV BOLUS
INTRAVENOUS | Status: DC | PRN
  Administered 2024-11-03: 12:00:00 50 mg via INTRAVENOUS

## 2024-11-03 NOTE — CV Procedure (Signed)
° ° °  TRANSESOPHAGEAL ECHOCARDIOGRAM   NAME:  Duane Greene    MRN: [id number] DOB:  29-Nov-1950    ADMIT DATE: 10/29/2024  INDICATIONS: bacteremia  PROCEDURE:   Informed consent was obtained prior to the procedure. The risks, benefits and alternatives for the procedure were discussed and the patient comprehended these risks.  Risks include, but are not limited to, cough, sore throat, vomiting, nausea, somnolence, esophageal and stomach trauma or perforation, bleeding, low blood pressure, aspiration, pneumonia, infection, trauma to the teeth and death.    Procedural time out performed. The oropharynx was anesthetized with viscous lidocaine .  Anesthesia was administered by the anaesthesilogy team to achieve and maintain moderate to deep conscious sedation.  The patient's heart rate, blood pressure, and oxygen saturation were monitored continuously during the procedure.  The transesophageal probe was inserted in the esophagus and stomach without difficulty and multiple views were obtained.   The patient tolerated the procedure well.  COMPLICATIONS:    There were no immediate complications.  KEY FINDINGS:  Normal left ventricular systolic function. Small mobile mass on the ventricular side of the anterior leaflet. Mild mitral regurgitation. Trivial aortic regurgitation Full report to follow. Further management per primary team.   Rosielee Corporan, DO [facility]  [facility]  12:30 PM

## 2024-11-03 NOTE — Anesthesia Preprocedure Evaluation (Addendum)
 Anesthesia Evaluation  Patient identified by MRN, date of birth, ID band Patient awake    Reviewed: Allergy & Precautions, NPO status , Patient's Chart, lab work & pertinent test results  Airway Mallampati: II  TM Distance: >3 FB Neck ROM: Full    Dental no notable dental hx.    Pulmonary sleep apnea and Continuous Positive Airway Pressure Ventilation    Pulmonary exam normal        Cardiovascular hypertension,  Rhythm:Regular Rate:Normal  Bacteremia   ECHO: IMPRESSIONS   1. Left ventricular ejection fraction, by estimation, is 55 to 60%. Left ventricular ejection fraction by 2D MOD biplane is 59.5 %. The left ventricle has normal function. The left ventricle has no regional wall motion abnormalities. Left ventricular diastolic parameters were normal.  2. Right ventricular systolic function is normal. The right ventricular size is normal. There is normal pulmonary artery systolic pressure.  3. The mitral valve is normal in structure. No evidence of mitral valve regurgitation. No evidence of mitral stenosis.  4. The aortic valve is normal in structure. Aortic valve regurgitation is not visualized. No aortic stenosis is present.   Comparison(s): No significant change from prior study.    Neuro/Psych negative neurological ROS  negative psych ROS   GI/Hepatic negative GI ROS, Neg liver ROS,,,  Endo/Other  negative endocrine ROS    Renal/GU negative Renal ROS  negative genitourinary   Musculoskeletal  (+) Arthritis , Osteoarthritis,    Abdominal Normal abdominal exam  (+)   Peds  Hematology negative hematology ROS (+)   Anesthesia Other Findings   Reproductive/Obstetrics                              Anesthesia Physical Anesthesia Plan  ASA: 3  Anesthesia Plan: MAC   Post-op Pain Management:    Induction: Intravenous  PONV Risk Score and Plan: Propofol  infusion and Treatment may vary  due to age or medical condition  Airway Management Planned: Simple Face Mask and Nasal Cannula  Additional Equipment: None  Intra-op Plan:   Post-operative Plan:   Informed Consent: I have reviewed the patients History and Physical, chart, labs and discussed the procedure including the risks, benefits and alternatives for the proposed anesthesia with the patient or authorized representative who has indicated his/her understanding and acceptance.     Dental advisory given  Plan Discussed with: CRNA  Anesthesia Plan Comments:          Anesthesia Quick Evaluation

## 2024-11-03 NOTE — PMR Pre-admission (Signed)
 PMR Admission Coordinator Pre-Admission Assessment  Patient: Duane Greene is an 73 y.o., male MRN: [id number] DOB: October 28, 1951 Height: 5' 7 (170.2 cm) Weight: 89.8 kg  Insurance Information HMO:     PPO: yes     PCP:      IPA:      80/20:      OTHER:  PRIMARY: [employer]      Policy#: [id number]; Medicare [id number]      Subscriber: pt CM Name: Madelin      Phone#: [phone]     Fax#: EPIC access Pre-Cert#:  [id number] Auth for CIR from Tammy  with [employer] for admit 12/18 with next review date in 7 days.  HTA has Epic access for continued stay reviews.        Employer:  Benefits:  Phone #: [phone]     Name: 12/16 Eff. Date: 11/19/23 until 11/17/24     Deduct: none      Out of Pocket Max: $3400      Life Max: none CIR: $325 co pay per day days 1 until 6      SNF: no copay per day days 1 until 20; $214 co pay per day days 21 until 100 Outpatient: $15 per visit     Co-Pay: visits per medical neccesity Home Health: 100%     Co-Pay:  DME: 75%     Co-Pay: 25% Providers: in network  SECONDARY: none      Policy#:      Phone#:   Artist:       Phone#:   The Best Boy for patients in Inpatient Rehabilitation Facilities with attached Privacy Act Statement-Health Care Records was provided and verbally reviewed with: Patient and Family  Emergency Contact Information Contact Information     Name Relation Home Work Mobile   [place] Spouse [phone]  [phone]      Other Contacts   None on File    Current Medical History  Patient Admitting Diagnosis: septic arthritis  History of Present Illness: 73 yo male with history of HTN, HLD, OSA on CPAP, and OA bilateral hips s/p guided steroid injection on 12/2 to right hip joint and imaging /arthrocentesis of right hip septic arthritis. Presented on 12/12 to ED with ongoing pain since injection along with decreased mobility. Was evaluated at [facility] 12/9 where  xray consistent with OA. Sent home with po oxycodone  after receiving IV Toradol  with some relief. Followed up on 12/9 with Dr Delane with OP MRI ordered for possible avascular necrosis.   On 12/12 febrile. Blood cultures consistent with S. Aureus. Ortho consulted. ID consulted and placed on IV Cefazolin . Underwent right hip aspiration on 12/13. Subsequently underwent I and D of the hip joint on 12/14. Continue pain management. Intraoperative cultures showing gram-positive cocci. ID recommended TEE 12/17. Findings of mitral valve vegetation. ID follow up with course of treatment.  Patient[facility] has been reviewed by the rehabilitation admission coordinator and physician.  Past Medical History  Past Medical History:  Diagnosis Date   Arthritis    History of kidney stones    Hyperlipidemia    Hypertension    Right inguinal hernia    Sleep apnea    uses CPAP nightly    Has the patient had major surgery during 100 days prior to admission? Yes  Family History   family history includes CVA in his father; Heart Problems in his father.  Current Medications Current Medications[1]  Patients Current Diet:  Diet Order             Diet general           Diet regular Room service appropriate? Yes; Fluid consistency: Thin  Diet effective now                  Precautions / Restrictions Precautions Precautions: Fall Restrictions Weight Bearing Restrictions Per Provider Order: Yes RLE Weight Bearing Per Provider Order: Weight bearing as tolerated   Has the patient had 2 or more falls or a fall with injury in the past year? No  Prior Activity Level Community (5-7x/wk): retired, Independent, active, driving  Prior Functional Level Self Care: Did the patient need help bathing, dressing, using the toilet or eating? Independent  Indoor Mobility: Did the patient need assistance with walking from room to room (with or without device)?  Independent  Stairs: Did the patient need assistance with internal or external stairs (with or without device)? Independent  Functional Cognition: Did the patient need help planning regular tasks such as shopping or remembering to take medications? Independent  Patient Information Are you of Hispanic, Latino/a,or Spanish origin?: A. No, not of Hispanic, Latino/a, or Spanish origin What is your race?: A. White Do you need or want an interpreter to communicate with a doctor or health care staff?: 0. No  Patient's Response To:  Health Literacy and Transportation Is the patient able to respond to health literacy and transportation needs?: Yes Health Literacy - How often do you need to have someone help you when you read instructions, pamphlets, or other written material from your doctor or pharmacy?: Never In the past 12 months, has lack of transportation kept you from medical appointments or from getting medications?: No In the past 12 months, has lack of transportation kept you from meetings, work, or from getting things needed for daily living?: No  Home Assistive Devices / Equipment Home Equipment: Agricultural Consultant (2 wheels), [employer] - single point, Information systems manager - built in, [employer] held shower head, BSC/3in1  Prior Device Use: Indicate devices/aids used by the patient prior to current illness, exacerbation or injury? None of the above  Current Functional Level Cognition  Orientation Level: Oriented X4    Extremity Assessment (includes Sensation/Coordination)  Upper Extremity Assessment: Overall WFL for tasks assessed  Lower Extremity Assessment: Defer to PT evaluation RLE: Unable to fully assess due to pain    ADLs  Overall ADL's : Needs assistance/impaired Eating/Feeding: Set up, Sitting Grooming: Wash/dry face, Set up, Sitting Upper Body Bathing: Minimal assistance, Sitting Lower Body Bathing: Maximal assistance Upper Body Dressing : Set up, Sitting Lower Body Dressing: Maximal  assistance Lower Body Dressing Details (indicate cue type and reason): Sit EOB Toilet Transfer: Moderate assistance, +2 for physical assistance, +2 for safety/equipment, BSC/3in1, Rolling walker (2 wheels) Toileting- Clothing Manipulation and Hygiene: Total assistance, Sit to/from stand, Bed level Toileting - Clothing Manipulation Details (indicate cue type and reason): Pt soiled in bed with BM. Completed posterior peri care bed level with Pt rolling to L and R sides. Pt with additional active BM at beginning of supine to sit transfer. Posterior completed for 2nd BM with Pt in standing.    Mobility  Overal bed mobility: Needs Assistance Bed Mobility: Supine to Sit Rolling: Mod assist, Max assist Sidelying to sit: Max assist Supine to sit: Min assist Sit to supine: Mod assist General bed mobility comments: RLE management, significantly increased time, cues for use of bed rail and sequencing  Transfers  Overall transfer level: Needs assistance Equipment used: Rolling walker (2 wheels), Ambulation equipment used Transfers: Sit to/from Stand, Bed to chair/wheelchair/BSC Sit to Stand: From elevated surface, Min assist Bed to/from chair/wheelchair/BSC transfer type:: Step pivot Step pivot transfers: Min assist, +2 physical assistance, +2 safety/equipment General transfer comment: Rocking forward to gain momentum, cues for hand placement, minA to power up to standing    Ambulation / Gait / Stairs / Wheelchair Mobility  Ambulation/Gait Ambulation/Gait assistance: Min assist, +2 safety/equipment Gait Distance (Feet): 15 Feet (15, 15) Assistive device: Rolling walker (2 wheels) Gait Pattern/deviations: Decreased stride length, Step-to pattern, Trunk flexed, Decreased weight shift to left, Decreased dorsiflexion - left, Decreased stance time - left General Gait Details: Verbal cues for sequencing/technique, upright posture. Pt with increased R foot external rotation (pt spouse states is  baseline). Pt with step to gait pattern. Gait velocity: decreased Gait velocity interpretation: <1.31 ft/sec, indicative of household ambulator    Posture / Balance Dynamic Sitting Balance Sitting balance - Comments: Cannot tolerate change. L lateral lean to off load hip Balance Overall balance assessment: Needs assistance Sitting-balance support: Single extremity supported, Feet supported Sitting balance-Leahy Scale: Fair Sitting balance - Comments: Cannot tolerate change. L lateral lean to off load hip Postural control: Left lateral lean Standing balance support: Bilateral upper extremity supported, During functional activity, Reliant on assistive device for balance Standing balance-Leahy Scale: Poor Standing balance comment: Dependent on RW and external support    Special considerations/life events  Limited DNR on acute Wife states ID planning 6 weeks IV antibiotics. They have neighbor who is retired IV engineer, civil (consulting) for [employer] and wife also capable to administer.   Previous Home Environment  Living Arrangements: Spouse/significant other  Lives With: Spouse Available Help at Discharge: Family, Available 24 hours/day Type of Home: House Home Layout: One level Home Access: Stairs to enter Entrance Stairs-Rails: None Entrance Stairs-Number of Steps: 3 Bathroom Shower/Tub: Health Visitor: Handicapped height Bathroom Accessibility: Yes How Accessible: Accessible via walker Home Care Services: No  Discharge Living Setting Plans for Discharge Living Setting: Patient's home, House, Lives with (comment) (wife) Type of Home at Discharge: House Discharge Home Layout: One level Discharge Home Access: Stairs to enter Entrance Stairs-Rails: None Entrance Stairs-Number of Steps: 3 Discharge Bathroom Shower/Tub: Walk-in shower Discharge Bathroom Toilet: Handicapped height Discharge Bathroom Accessibility: Yes How Accessible: Accessible via walker Does the patient have any  problems obtaining your medications?: No  Social/Family/Support Systems Patient Roles: Spouse Contact Information: wife Anticipated Caregiver: wife Anticipated Caregiver's Contact Information: see contacts Ability/Limitations of Caregiver: no limitations Caregiver Availability: 24/7 Discharge Plan Discussed with Primary Caregiver: Yes Is Caregiver In Agreement with Plan?: Yes Does Caregiver/Family have Issues with Lodging/Transportation while Pt is in Rehab?: No  Goals Patient/Family Goal for Rehab: Mod I with PT and OT Expected length of stay: ELOS 10 to 14 days Pt/Family Agrees to Admission and willing to participate: Yes Program Orientation Provided & Reviewed with Pt/Caregiver Including Roles  & Responsibilities: Yes  Decrease burden of Care through IP rehab admission: n/a  Possible need for SNF placement upon discharge: not anticipated  Patient Condition: This patient's condition remains as documented in the consult dated 11/02/24, in which the Rehabilitation Physician determined and documented that the patient's condition is appropriate for intensive rehabilitative care in an inpatient rehabilitation facility. Will admit to inpatient rehab today.  Preadmission Screen Completed By:  Alison Heron Lot RN MSN, 11/04/2024 12:02 PM ______________________________________________________________________   Discussed status with Dr. Emeline on 11/14/24  at 1144 and received approval for admission today.  Admission Coordinator:  Alison Heron Lot, RN MSN time [id number] Date 11/04/24   Assessment/Plan: Diagnosis: Debility due to R hip septic arthritis s/p I&D 12/14 Does the need for close, 24 hr/day Medical supervision in concert with the patient's rehab needs make it unreasonable for this patient to be served in a less intensive setting? Yes Co-Morbidities requiring supervision/potential complications: MSSA bacteremia, hypertension, hyponatremia, OSA Due to bladder management,  bowel management, safety, skin/wound care, disease management, medication administration, pain management, and patient education, does the patient require 24 hr/day rehab nursing? Yes Does the patient require coordinated care of a physician, rehab nurse, PT, OT to address physical and functional deficits in the context of the above medical diagnosis(es)? Yes Addressing deficits in the following areas: balance, endurance, locomotion, strength, transferring, bathing, dressing, and toileting Can the patient actively participate in an intensive therapy program of at least 3 hrs of therapy 5 days a week? Yes The potential for patient to make measurable gains while on inpatient rehab is excellent Anticipated functional outcomes upon discharge from inpatient rehab: modified independent PT, modified independent OT Estimated rehab length of stay to reach the above functional goals is: 10-14 days Anticipated discharge destination: Home 10. Overall Rehab/Functional Prognosis: good   MD Signature:  Joesph JAYSON Likes, DO 11/04/2024      [1]  Current Facility-Administered Medications:    acetaminophen  (TYLENOL ) tablet 1,000 mg, 1,000 mg, Oral, TID, Moore, Michael A, MD, 1,000 mg at 11/04/24 9182   acetaminophen  (TYLENOL ) tablet 650 mg, 650 mg, Oral, Q4H PRN, Moore, Michael A, MD, 650 mg at 10/30/24 0400   ceFAZolin  (ANCEF ) IVPB 2g/100 mL premix, 2 g, Intravenous, Q8H, Georgina Ozell LABOR, MD, Last Rate: 200 mL/hr at 11/04/24 0820, 2 g at 11/04/24 0820   cholecalciferol  (VITAMIN D3) 25 MCG (1000 UNIT) tablet 1,000 Units, 1,000 Units, Oral, Daily, Georgina Ozell LABOR, MD, 1,000 Units at 11/04/24 9182   enoxaparin  (LOVENOX ) injection 40 mg, 40 mg, Subcutaneous, Q24H, Moore, Michael A, MD, 40 mg at 11/04/24 9182   gabapentin  (NEURONTIN ) capsule 300 mg, 300 mg, Oral, TID, Moore, Michael A, MD, 300 mg at 11/04/24 9182   losartan  (COZAAR ) tablet 50 mg, 50 mg, Oral, Daily, 50 mg at 11/04/24 0817 **AND**  hydrochlorothiazide  (HYDRODIURIL ) tablet 12.5 mg, 12.5 mg, Oral, Daily, Moore, Michael A, MD, 12.5 mg at 11/04/24 9182   lidocaine  (LIDODERM ) 5 % 1 patch, 1 patch, Transdermal, Q24H, Georgina Ozell LABOR, MD, 1 patch at 11/03/24 2153   methocarbamol  (ROBAXIN ) tablet 500 mg, 500 mg, Oral, TID, Moore, Michael A, MD, 500 mg at 11/04/24 9182   multivitamin with minerals tablet 1 tablet, 1 tablet, Oral, QHS, Georgina Ozell LABOR, MD, 1 tablet at 11/03/24 2152   oxyCODONE  (Oxy IR/ROXICODONE ) immediate release tablet 5 mg, 5 mg, Oral, Q4H PRN, Moore, Michael A, MD, 5 mg at 11/04/24 0405   polyethylene glycol (MIRALAX  / GLYCOLAX ) packet 17 g, 17 g, Oral, BID, Georgina Ozell LABOR, MD, 17 g at 11/01/24 0915   senna-docusate (Senokot-S) tablet 2 tablet, 2 tablet, Oral, BID, Georgina Ozell LABOR, MD, 2 tablet at 11/01/24 2204   simvastatin  (ZOCOR ) tablet 40 mg, 40 mg, Oral, QPM, Moore, Michael A, MD, 40 mg at 11/03/24 1717   terbinafine  (LAMISIL ) tablet 250 mg, 250 mg, Oral, Q Pablo Georgina Ozell LABOR, MD, 250 mg at 11/01/24 [phone]

## 2024-11-03 NOTE — Progress Notes (Signed)
 Physical Therapy Treatment Patient Details Name: Duane Greene MRN: [id number] DOB: 25-Aug-1951 Today's Date: 11/03/2024   History of Present Illness Duane Greene is a 73 y.o. male who p/w R hip joint swelling and imaging/arthrocentesis c/f R hip septic arthritis. Past medical history significant of HTN, HLD, OSA (on CPAP), and OA b/l hips s/p US  guided steroid injection on 12/2.    PT Comments  Pt tolerated treatment well today. Pt still requiring Mod/Max A for bed mobility for pericare however was able to step pivot transfer to recliner today with +2 Min A using RW. Anticipate pt will be able to progress ambulation very soon. No change in DC/DME recs at this time. PT will continue to follow.     If plan is discharge home, recommend the following: A lot of help with walking and/or transfers;A lot of help with bathing/dressing/bathroom   Can travel by private vehicle        Equipment Recommendations  Wheelchair (measurements PT);Wheelchair cushion (measurements PT);Hospital bed    Recommendations for Other Services       Precautions / Restrictions Precautions Precautions: Fall Recall of Precautions/Restrictions: Intact Restrictions Weight Bearing Restrictions Per Provider Order: Yes RLE Weight Bearing Per Provider Order: Weight bearing as tolerated     Mobility  Bed Mobility Overal bed mobility: Needs Assistance Bed Mobility: Rolling, Sidelying to Sit Rolling: Mod assist, Max assist Sidelying to sit: Max assist       General bed mobility comments: Mod/Max A to roll for prolonged pericare. Max A for trunk elevation.    Transfers Overall transfer level: Needs assistance Equipment used: Rolling walker (2 wheels), Ambulation equipment used Transfers: Sit to/from Stand, Bed to chair/wheelchair/BSC Sit to Stand: Mod assist, From elevated surface   Step pivot transfers: +2 physical assistance, Min assist, +2 safety/equipment       General transfer comment: ModA to  power up to stand from edge of bed. +2 Min A to step pivot to chair.    Ambulation/Gait               General Gait Details: Deferred   Stairs             Wheelchair Mobility     Tilt Bed    Modified Rankin (Stroke Patients Only)       Balance Overall balance assessment: Needs assistance Sitting-balance support: Single extremity supported, Feet supported Sitting balance-Leahy Scale: Fair     Standing balance support: Bilateral upper extremity supported, During functional activity, Reliant on assistive device for balance Standing balance-Leahy Scale: Poor Standing balance comment: Dependent on RW and external support                            Communication Communication Communication: No apparent difficulties  Cognition Arousal: Alert Behavior During Therapy: WFL for tasks assessed/performed   PT - Cognitive impairments: No apparent impairments                         Following commands: Intact      Cueing Cueing Techniques: Verbal cues, Tactile cues, Visual cues  Exercises      General Comments        Pertinent Vitals/Pain Pain Assessment Pain Assessment: Faces Faces Pain Scale: Hurts even more Pain Location: R hip Pain Descriptors / Indicators: Discomfort, Grimacing, Guarding Pain Intervention(s): Limited activity within patient's tolerance, Monitored during session, Repositioned    Home Living  Prior Function            PT Goals (current goals can now be found in the care plan section) Progress towards PT goals: Progressing toward goals    Frequency    Min 3X/week      PT Plan      Co-evaluation              AM-PAC PT 6 Clicks Mobility   Outcome Measure  Help needed turning from your back to your side while in a flat bed without using bedrails?: A Lot Help needed moving from lying on your back to sitting on the side of a flat bed without using bedrails?: A  Lot Help needed moving to and from a bed to a chair (including a wheelchair)?: A Lot Help needed standing up from a chair using your arms (e.g., wheelchair or bedside chair)?: A Lot Help needed to walk in hospital room?: Total Help needed climbing 3-5 steps with a railing? : Total 6 Click Score: 10    End of Session Equipment Utilized During Treatment: Gait belt Activity Tolerance: Patient tolerated treatment well Patient left: with call bell/phone within reach;in bed;with bed alarm set Nurse Communication: Mobility status PT Visit Diagnosis: Other abnormalities of gait and mobility (R26.89)     Time: 8459-8392 PT Time Calculation (min) (ACUTE ONLY): 27 min  Charges:    $Gait Training: 8-22 mins PT General Charges $$ ACUTE PT VISIT: 1 Visit                     Jerran Tappan B, PT, DPT Acute Rehab Services [phone]    Lillieann Pavlich 11/03/2024, 4:30 PM

## 2024-11-03 NOTE — Discharge Instructions (Addendum)
 Orthopedic Surgery Discharge Instructions  Patient name: Duane Greene Diagnosis: Right hip septic arthritis, right gluteus muscle myositis Procedure Performed: Right hip incision and drainage Date of Surgery: 10/31/2025 Surgeon: Ozell Ada, MD  Activity: You are allowed to put as much weight on your leg as you would like. You can walk as much as you would like.   Incision Care: Your incision site has a dressing over it. That dressing should remain in place and dry at all times for a total of one week after surgery. After one week, you can remove the dressing. Underneath the dressing, you will find skin staples. You should leave these staples in place. They will be taken out in the office when the wound has healed. Do not pick, rub, or scrub at them. Do not put cream or lotion over the surgical area. After one week and once the dressing is off, it is okay to let soap and water run over your incision. Again, do not pick, scrub, or rub at the staples when bathing. Do not submerge (e.g., take a bath, swim, go in a hot tub, etc.) until six weeks after surgery. There may be some bloody drainage from the incision into the dressing after surgery. This is normal. You do not need to replace the dressing. Continue to leave it in place for the one week as instructed above. Should the dressing become saturated with blood or drainage, please call the office for further instructions.   In order to set expectations for opioid prescriptions, you will only be prescribed opioids for a total of six weeks after surgery and, at two-weeks after surgery, your opioid prescription will start to tapered (decreased dosage and number of pills). If you have ongoing need for opioid medication six weeks after surgery, you will be referred to pain management. If you are already established with a provider that is giving you opioid medications, you should schedule an appointment with them for six weeks after surgery if you feel you  are going to need another prescription. State law only allows for opioid prescriptions one week at a time. If you are running out of opioid medication near the end of the week, please call the office during business hours before running out so I can send you another prescription.   Driving: You should not drive while taking narcotic pain medications. You should start getting back to driving slowly and you may want to try driving in a parking lot before doing anything more.   Diet: You are safe to resume your regular diet after surgery.   Reasons to Call the Office After Surgery: You should feel free to call the office with any concerns or questions you have in the post-operative period, but you should definitely notify the office if you develop: -shortness of breath, chest pain, or trouble breathing -excessive bleeding, drainage, redness, or swelling around the surgical site -fevers, chills, or pain that is getting worse with each passing day -persistent nausea or vomiting -new weakness in the right lower extremity, new or worsening numbness or tingling in the right lower extremity -other concerns about your surgery  Follow Up Appointments: You have a follow-up visit scheduled with Dr. Ada on 11/23/2024 at 10:30 AM.  The office location and phone number are listed below.  Please arrive on time.  Office Information:  -Ozell Ada, MD -Phone number: [phone] -Address: [address]       [place], [place] [address]

## 2024-11-03 NOTE — Progress Notes (Signed)
 Orthopedic Surgery Progress Note   Assessment: Patient is a 73 y.o. male with right hip septic arthritis, right gluteus musculature myositis   S/p incision and drainage of right hip   Plan: -Orthopedic operative plans complete -Intraoperative culture showing Staph aureus  -Infectious disease consulted, appreciate their recommendations for antibiotic management -Okay for diet from orthopedic perspective -DVT ppx: aspirin  81mg  BID -Weight bearing status: as tolerated  -PT evaluate and treat -Pain control -Dispo: per primary  ___________________________________________________________________________  Subjective: No acute events overnight.  Pain in hip still improved from presentation.  No significant changes in the amount of pain since he was seen last night.  Was able to get some sleep last night.  No longer having any pain when resting in bed.  Most of his pain at this point is in the buttock.  Not having as much groin pain.  Is requiring less narcotics to control the pain.   Physical Exam:  General: no acute distress, appears stated age Neurologic: alert, answering questions appropriately, following commands Respiratory: unlabored breathing on room air, symmetric chest rise Psychiatric: appropriate affect, normal cadence to speech  MSK:    -Right lower extremity  Improved tolerance for passive range of motion at the hip  Weak hip flexion EHL/TA/GSC intact Plantarflexes and dorsiflexes toes Sensation intact to light touch in sural, saphenous, tibial, deep peroneal, and superficial peroneal nerve distributions Foot warm and well perfused   Yesterday's total administered Morphine  Milligram Equivalents: 7.5   Patient name: Duane Greene Patient MRN: [id number] Date: 11/03/2024

## 2024-11-03 NOTE — H&P (View-Only) (Signed)
 PROGRESS NOTE  Duane Greene FMW:[id number] DOB: 09-Dec-1950 DOA: 10/29/2024 PCP: Duane Greene LABOR, MD   LOS: 4 days   Brief narrative:  Duane Greene is a 73 y.o. male with past medical history significant for hypertension hyperlipidemia, obstructive sleep apnea on CPAP, and OA bilateral hips s/p US  guided steroid injection on 10/19/24 presented to hospital with right hip joint swelling since injection with decreased mobility.  Patient then presented to [facility] ED on 12/ 9/25 where an x-ray was done which was consistent with osteoarthritis so was given medications to go home with.  Subsequently, patient also saw orthopedics in outpatient MRI was ordered.  Patient then went home but then despite taking medications for pain it was unbearable so presented to the ED.   In the ED, patient was febrile with temperature of 100.4 deg F x1. Labs with notable for WBC 12.9-->12.3, lactic acid 2-->1.1, and CRP 27.6.  Urinalysis showed are bacteruria and WBC 0-5. Blood cultures were sent from the ED was consistent with S. aureus. ED provider consulted orthopedic surgery, ID, and  patient was admitted to the hospital for further evaluation and treatment   Assessment/Plan: Principal Problem:   Septic arthritis (HCC)  Right hip septic arthritis Gram-positive cocci bacteremia Infectious disease on board and currently on IV cefazolin .  Orthopedics was consulted and patient underwent right hip aspiration on 10/30/2024.  Patient then underwent I&D of the hip joint and underwent irrigation and debridement of the right hip on 10/31/2024.    Intraoperative cultures showing gram-positive cocci.  ID recommended TTE which showed LV ejection fraction of 55 to 60% with no regional wall motion abnormality and no mention of vegetation.  Plan for TEE at this time.  Follow orthopedic and ID recommendations.  On aspirin  twice daily for DVT prophylaxis.  Patient is weightbearing as tolerated.  Essential  hypertension Patient is on losartan -hydrochlorothiazide  50-12.5mg  daily at home.  Blood pressure stable at this time.   OSA Continue CPAP at nighttime.   Hyponatremia. Mild improved.  Sodium level of 139  DVT prophylaxis: enoxaparin  (LOVENOX ) injection 40 mg Start: 10/30/24 0930 SCDs Start: 10/30/24 9083   Disposition: PT OT recommends CIR at this time  Status is: Inpatient Remains inpatient appropriate because: Pending clinical improvement, need for rehabilitation    Code Status:     Code Status: Limited: Do not attempt resuscitation (DNR) -DNR-LIMITED -Do Not Intubate/DNI   Family Communication:  Spoke with the patient's spouse at bedside on 11/03/2024.  Consultants: Orthopedics Infectious disease  Procedures: Right hip aspiration 10/30/2024 Irrigation and debridement of the right hip on 10/31/2024  Anti-infectives:  Cefazolin  IV  Anti-infectives (From admission, onward)    Start     Dose/Rate Route Frequency Ordered Stop   11/01/24 1000  terbinafine  (LAMISIL ) tablet 250 mg        250 mg Oral Every Mon 10/30/24 0811     10/31/24 1738  vancomycin  (VANCOCIN ) powder  Status:  Discontinued          As needed 10/31/24 1738 10/31/24 1827   10/30/24 2300  vancomycin  (VANCOCIN ) IVPB 1000 mg/200 mL premix  Status:  Discontinued        1,000 mg 200 mL/hr over 60 Minutes Intravenous Every 12 hours 10/30/24 0959 10/30/24 1002   10/30/24 1000  vancomycin  (VANCOREADY) IVPB 1750 mg/350 mL  Status:  Discontinued        1,750 mg 175 mL/hr over 120 Minutes Intravenous  Once 10/30/24 0952 10/30/24 1002   10/30/24 1000  ceFAZolin  (  ANCEF ) IVPB 2g/100 mL premix        2 g 200 mL/hr over 30 Minutes Intravenous Every 8 hours 10/30/24 0957     10/29/24 2245  terbinafine  (LAMISIL ) tablet 250 mg  Status:  Discontinued        250 mg Oral Every 7 days 10/29/24 2235 10/30/24 0810        Subjective: Today, patient was seen and examined at bedside.  Patient states that he feels a little  better today.  Denies any fever chills nausea vomiting shortness of breath or chest pain.  He is n.p.o. for TEE today.   Objective: Vitals:   11/03/24 0447 11/03/24 0809  BP: 119/60 (!) 124/55  Pulse: 67 67  Resp: 19 17  Temp: 98.3 F (36.8 C) 98 F (36.7 C)  SpO2: 95% 94%   No intake or output data in the 24 hours ending 11/03/24 1034  There were no vitals filed for this visit. There is no height or weight on file to calculate BMI.   Physical Exam:  General:  Average built, not in obvious distress, alert awake and oriented, HENT:   No scleral pallor or icterus noted. Oral mucosa is moist.  Chest:  Clear breath sounds.  Diminished breath sounds bilaterally. No crackles or wheezes.  CVS: S1 &S2 heard. No murmur.  Regular rate and rhythm. Abdomen: Soft, nontender, nondistended.  Bowel sounds are heard.   Extremities: No cyanosis, clubbing or edema.  Peripheral pulses are palpable.  Right hip with difficulty movement of the hip Psych: Alert, awake and oriented, normal mood CNS:  No cranial nerve deficits.  Moves all extremities Skin: Warm and dry.  No rashes noted.   Data Review: I have personally reviewed the following laboratory data and studies,  CBC: Recent Labs  Lab 10/29/24 1225 10/30/24 1000 10/31/24 0013 11/01/24 0555 11/03/24 0508  WBC 12.9* 12.3* 14.4* 14.9* 10.0  NEUTROABS 10.0* 9.6*  --  13.1*  --   HGB 15.3 14.4 13.8 13.2 13.1  HCT 44.4 40.5 38.9* 37.8* 37.8*  MCV 94.3 92.9 91.5 92.4 94.7  PLT 124* 106* 133* 165 248   Basic Metabolic Panel: Recent Labs  Lab 10/29/24 1225 10/30/24 1000 10/31/24 0013 11/03/24 0508  NA 132* 134* 135 139  K 3.5 3.6 3.5 4.0  CL 93* 100 99 103  CO2 27 26 25 28   GLUCOSE 114* 165* 184* 105*  BUN 20 22 25* 23  CREATININE 1.01 0.88  0.87 0.88 0.66  CALCIUM 8.3* 7.9* 8.3* 8.5*   Liver Function Tests: Recent Labs  Lab 10/29/24 1225 10/30/24 1000  AST 48* 51*  ALT 55* 50*  ALKPHOS 98 103  BILITOT 1.8* 1.2  PROT  7.1 5.8*  ALBUMIN 3.1* 2.5*   No results for input(s): LIPASE, AMYLASE in the last 168 hours. No results for input(s): AMMONIA in the last 168 hours. Cardiac Enzymes: Recent Labs  Lab 10/30/24 1000  CKTOTAL 148   BNP (last 3 results) No results for input(s): BNP in the last 8760 hours.  ProBNP (last 3 results) No results for input(s): PROBNP in the last 8760 hours.  CBG: Recent Labs  Lab 11/02/24 0559  GLUCAP 149*   Recent Results (from the past 240 hours)  Blood Culture (routine x 2)     Status: Abnormal   Collection Time: 10/29/24  5:09 PM   Specimen: BLOOD LEFT ARM  Result Value Ref Range Status   Specimen Description BLOOD LEFT ARM  Final   Special Requests  Final    BOTTLES DRAWN AEROBIC AND ANAEROBIC Blood Culture adequate volume   Culture  Setup Time   Final    GRAM POSITIVE COCCI IN BOTH AEROBIC AND ANAEROBIC BOTTLES CRITICAL RESULT CALLED TO, READ BACK BY AND VERIFIED WITH: PHARMD M. MITCHELL 1212 AT 0955, ADC Performed at [facility], [address] [place], [place] [address]    Culture STAPHYLOCOCCUS AUREUS (A)  Final   Report Status 11/01/2024 FINAL  Final   Organism ID, Bacteria STAPHYLOCOCCUS AUREUS  Final      Susceptibility   Staphylococcus aureus - MIC*    CIPROFLOXACIN <=0.5 SENSITIVE Sensitive     ERYTHROMYCIN <=0.25 SENSITIVE Sensitive     GENTAMICIN <=0.5 SENSITIVE Sensitive     OXACILLIN 0.5 SENSITIVE Sensitive     TETRACYCLINE <=1 SENSITIVE Sensitive     VANCOMYCIN  1 SENSITIVE Sensitive     TRIMETH/SULFA <=10 SENSITIVE Sensitive     CLINDAMYCIN <=0.25 SENSITIVE Sensitive     RIFAMPIN <=0.5 SENSITIVE Sensitive     Inducible Clindamycin NEGATIVE Sensitive     LINEZOLID 2 SENSITIVE Sensitive     * STAPHYLOCOCCUS AUREUS  Blood Culture ID Panel (Reflexed)     Status: Abnormal   Collection Time: 10/29/24  5:09 PM  Result Value Ref Range Status   Enterococcus faecalis NOT DETECTED NOT DETECTED Final   Enterococcus Faecium  NOT DETECTED NOT DETECTED Final   Listeria monocytogenes NOT DETECTED NOT DETECTED Final   Staphylococcus species DETECTED (A) NOT DETECTED Final    Comment: CRITICAL RESULT CALLED TO, READ BACK BY AND VERIFIED WITH: PHARMD M. MITCHELL 1212 AT 0955, ADC    Staphylococcus aureus (BCID) DETECTED (A) NOT DETECTED Final    Comment: CRITICAL RESULT CALLED TO, READ BACK BY AND VERIFIED WITH: PHARMD M. MITCHELL 1212 AT 0955, ADC    Staphylococcus epidermidis NOT DETECTED NOT DETECTED Final   Staphylococcus lugdunensis NOT DETECTED NOT DETECTED Final   Streptococcus species NOT DETECTED NOT DETECTED Final   Streptococcus agalactiae NOT DETECTED NOT DETECTED Final   Streptococcus pneumoniae NOT DETECTED NOT DETECTED Final   Streptococcus pyogenes NOT DETECTED NOT DETECTED Final   A.calcoaceticus-baumannii NOT DETECTED NOT DETECTED Final   Bacteroides fragilis NOT DETECTED NOT DETECTED Final   Enterobacterales NOT DETECTED NOT DETECTED Final   Enterobacter cloacae complex NOT DETECTED NOT DETECTED Final   Escherichia coli NOT DETECTED NOT DETECTED Final   Klebsiella aerogenes NOT DETECTED NOT DETECTED Final   Klebsiella oxytoca NOT DETECTED NOT DETECTED Final   Klebsiella pneumoniae NOT DETECTED NOT DETECTED Final   Proteus species NOT DETECTED NOT DETECTED Final   Salmonella species NOT DETECTED NOT DETECTED Final   Serratia marcescens NOT DETECTED NOT DETECTED Final   Haemophilus influenzae NOT DETECTED NOT DETECTED Final   Neisseria meningitidis NOT DETECTED NOT DETECTED Final   Pseudomonas aeruginosa NOT DETECTED NOT DETECTED Final   Stenotrophomonas maltophilia NOT DETECTED NOT DETECTED Final   Candida albicans NOT DETECTED NOT DETECTED Final   Candida auris NOT DETECTED NOT DETECTED Final   Candida glabrata NOT DETECTED NOT DETECTED Final   Candida krusei NOT DETECTED NOT DETECTED Final   Candida parapsilosis NOT DETECTED NOT DETECTED Final   Candida tropicalis NOT DETECTED NOT  DETECTED Final   Cryptococcus neoformans/gattii NOT DETECTED NOT DETECTED Final   Meth resistant mecA/C and MREJ NOT DETECTED NOT DETECTED Final    Comment: Performed at [facility], [address], [place], [place] [address]  Blood Culture (routine x 2)  Status: Abnormal   Collection Time: 10/29/24  5:14 PM   Specimen: BLOOD RIGHT ARM  Result Value Ref Range Status   Specimen Description BLOOD RIGHT ARM  Final   Special Requests   Final    BOTTLES DRAWN AEROBIC AND ANAEROBIC Blood Culture adequate volume   Culture  Setup Time   Final    GRAM POSITIVE COCCI IN BOTH AEROBIC AND ANAEROBIC BOTTLES CRITICAL VALUE NOTED.  VALUE IS CONSISTENT WITH PREVIOUSLY REPORTED AND CALLED VALUE.    Culture (A)  Final    STAPHYLOCOCCUS AUREUS SUSCEPTIBILITIES PERFORMED ON PREVIOUS CULTURE WITHIN THE LAST 5 DAYS. Performed at [facility], [address] [place], [place] [address]    Report Status 11/01/2024 FINAL  Final  Resp panel by RT-PCR (RSV, Flu A&B, Covid) Anterior Nasal Swab     Status: None   Collection Time: 10/29/24  5:33 PM   Specimen: Anterior Nasal Swab  Result Value Ref Range Status   SARS Coronavirus 2 by RT PCR NEGATIVE NEGATIVE Final   Influenza A by PCR NEGATIVE NEGATIVE Final   Influenza B by PCR NEGATIVE NEGATIVE Final    Comment: (NOTE) The Xpert Xpress SARS-CoV-2/FLU/RSV plus assay is intended as an aid in the diagnosis of influenza from Nasopharyngeal swab specimens and should not be used as a sole basis for treatment. Nasal washings and aspirates are unacceptable for Xpert Xpress SARS-CoV-2/FLU/RSV testing.  Fact Sheet for Patients: bloggercourse.com  Fact Sheet for Healthcare Providers: seriousbroker.it  This test is not yet approved or cleared by the [place]  FDA and has been authorized for detection and/or diagnosis of SARS-CoV-2 by FDA under an Emergency Use Authorization (EUA). This EUA will  remain in effect (meaning this test can be used) for the duration of the COVID-19 declaration under Section 564(b)(1) of the Act, 21 U.S.C. section 360bbb-3(b)(1), unless the authorization is terminated or revoked.     Resp Syncytial Virus by PCR NEGATIVE NEGATIVE Final    Comment: (NOTE) Fact Sheet for Patients: bloggercourse.com  Fact Sheet for Healthcare Providers: seriousbroker.it  This test is not yet approved or cleared by the [place]  FDA and has been authorized for detection and/or diagnosis of SARS-CoV-2 by FDA under an Emergency Use Authorization (EUA). This EUA will remain in effect (meaning this test can be used) for the duration of the COVID-19 declaration under Section 564(b)(1) of the Act, 21 U.S.C. section 360bbb-3(b)(1), unless the authorization is terminated or revoked.  Performed at [facility], [address], [place], [place] [address]   Urine Culture     Status: None   Collection Time: 10/30/24  7:49 AM   Specimen: Urine, Clean Catch  Result Value Ref Range Status   Specimen Description URINE, CLEAN CATCH  Final   Special Requests NONE  Final   Culture   Final    NO GROWTH Performed at [facility], [address], [place], [place] [address]    Report Status 10/31/2024 FINAL  Final  Culture, blood (Routine X 2) w Reflex to ID Panel     Status: None (Preliminary result)   Collection Time: 10/31/24 12:15 AM   Specimen: BLOOD RIGHT HAND  Result Value Ref Range Status   Specimen Description BLOOD RIGHT HAND  Final   Special Requests   Final    BOTTLES DRAWN AEROBIC AND ANAEROBIC Blood Culture adequate volume   Culture   Final    NO GROWTH 3 DAYS Performed at [facility], [address] [place], [place] [address]    Report Status PENDING  Incomplete  Culture, blood (Routine X 2) w Reflex to ID Panel     Status: None (Preliminary result)   Collection Time: 10/31/24 12:26 AM    Specimen: BLOOD RIGHT ARM  Result Value Ref Range Status   Specimen Description BLOOD RIGHT ARM  Final   Special Requests   Final    BOTTLES DRAWN AEROBIC AND ANAEROBIC Blood Culture adequate volume   Culture   Final    NO GROWTH 3 DAYS Performed at [facility], [address] [place], [place] [address]    Report Status PENDING  Incomplete  Aerobic/Anaerobic Culture w Gram Stain (surgical/deep wound)     Status: None (Preliminary result)   Collection Time: 10/31/24  5:45 PM   Specimen: Synovial, Right Hip; Body Fluid  Result Value Ref Range Status   Specimen Description SYNOVIAL  Final   Special Requests RIGHT HIP  Final   Gram Stain   Final    MODERATE GRAM POSITIVE COCCI RARE GRAM POSITIVE COCCI Performed at [facility], [address], [place], [place] [address]    Culture   Final    RARE STAPHYLOCOCCUS AUREUS NO ANAEROBES ISOLATED; CULTURE IN PROGRESS FOR 5 DAYS    Report Status PENDING  Incomplete   Organism ID, Bacteria STAPHYLOCOCCUS AUREUS  Final      Susceptibility   Staphylococcus aureus - MIC*    CIPROFLOXACIN <=0.5 SENSITIVE Sensitive     ERYTHROMYCIN <=0.25 SENSITIVE Sensitive     GENTAMICIN <=0.5 SENSITIVE Sensitive     OXACILLIN 0.5 SENSITIVE Sensitive     TETRACYCLINE <=1 SENSITIVE Sensitive     VANCOMYCIN  1 SENSITIVE Sensitive     TRIMETH/SULFA <=10 SENSITIVE Sensitive     CLINDAMYCIN <=0.25 SENSITIVE Sensitive     RIFAMPIN <=0.5 SENSITIVE Sensitive     Inducible Clindamycin NEGATIVE Sensitive     LINEZOLID 2 SENSITIVE Sensitive     * RARE STAPHYLOCOCCUS AUREUS     Studies: CT HEAD WO CONTRAST ( ) Result Date: 11/02/2024 EXAM: CT HEAD WITHOUT CONTRAST 11/02/2024 03:36:33 PM TECHNIQUE: CT of the head was performed without the administration of intravenous contrast. Automated exposure control, iterative reconstruction, and/or weight based adjustment of the mA/kV was utilized to reduce the radiation dose to as low as reasonably achievable.  COMPARISON: None available. CLINICAL HISTORY: Waxing and waning confusion FINDINGS: BRAIN AND VENTRICLES: No acute hemorrhage. No evidence of acute infarct. No hydrocephalus. No extra-axial collection. No mass effect or midline shift. Periventricular and subcortical white matter hypoattenuation, consistent with moderate chronic ischemic microvascular disease. Calcified atherosclerotic plaque within cavernous/supraclinoid internal carotid arteries. ORBITS: No acute abnormality. SINUSES: No acute abnormality. SOFT TISSUES AND SKULL: No acute soft tissue abnormality. No skull fracture. IMPRESSION: 1. No acute intracranial abnormality. Electronically signed by: Donnice Mania MD 11/02/2024 09:20 PM EST RP Workstation: [id number]      Vernal Alstrom, MD  [facility] 11/03/2024  If 7PM-7AM, please contact night-coverage

## 2024-11-03 NOTE — Transfer of Care (Signed)
 Immediate Anesthesia Transfer of Care Note  Patient: Duane Greene  Procedure(s) Performed: TRANSESOPHAGEAL ECHOCARDIOGRAM  Patient Location: PACU  Anesthesia Type:MAC  Level of Consciousness: drowsy  Airway & Oxygen Therapy: Patient Spontanous Breathing and Patient connected to nasal cannula oxygen  Post-op Assessment: Report given to RN and Post -op Vital signs reviewed and stable  Post vital signs: Reviewed and stable  Last Vitals:  Vitals Value Taken Time  BP    Temp    Pulse 72 11/03/24 12:34  Resp 21 11/03/24 12:34  SpO2 94 % 11/03/24 12:34  Vitals shown include unfiled device data.  Last Pain:  Vitals:   11/03/24 1043  TempSrc:   PainSc: 0-No pain      Patients Stated Pain Goal: 4 (10/30/24 1950)  Complications: No notable events documented.

## 2024-11-03 NOTE — Progress Notes (Signed)
 OT Cancellation Note  Patient Details Name: Duane Greene MRN: [id number] DOB: Jul 20, 1951   Cancelled Treatment:    Reason Eval/Treat Not Completed: Patient at procedure or test/ unavailable. Pt currently off floor for transesophageal echocardiogram. OT to continue to follow Pt as appropriate and schedule allows.   Duane Greene, OTR/LSABRA  [facility] Acute Rehabilitation  Office: [phone]   Duane PARAS Gigi Onstad 11/03/2024, 10:40 AM

## 2024-11-03 NOTE — Interval H&P Note (Signed)
 History and Physical Interval Note:  11/03/2024 10:52 AM  Duane Greene  has presented today for surgery, with the diagnosis of bacteremia.  The various methods of treatment have been discussed with the patient and family. After consideration of risks, benefits and other options for treatment, the patient has consented to  Procedures: TRANSESOPHAGEAL ECHOCARDIOGRAM (N/A) as a surgical intervention.  The patient's history has been reviewed, patient examined, no change in status, stable for surgery.  I have reviewed the patient's chart and labs.  Questions were answered to the patient's satisfaction.     [name]

## 2024-11-03 NOTE — Progress Notes (Signed)
 PROGRESS NOTE  Duane Greene:[id number] DOB: 09-Dec-1950 DOA: 10/29/2024 PCP: [name] LABOR, MD   LOS: 4 days   Brief narrative:  Duane Greene is a 73 y.o. male with past medical history significant for hypertension hyperlipidemia, obstructive sleep apnea on CPAP, and OA bilateral hips s/p US  guided steroid injection on 10/19/24 presented to hospital with right hip joint swelling since injection with decreased mobility.  Patient then presented to [facility] ED on 12/ 9/25 where an x-ray was done which was consistent with osteoarthritis so was given medications to go home with.  Subsequently, patient also saw orthopedics in outpatient MRI was ordered.  Patient then went home but then despite taking medications for pain it was unbearable so presented to the ED.   In the ED, patient was febrile with temperature of 100.4 deg F x1. Labs with notable for WBC 12.9-->12.3, lactic acid 2-->1.1, and CRP 27.6.  Urinalysis showed are bacteruria and WBC 0-5. Blood cultures were sent from the ED was consistent with S. aureus. ED provider consulted orthopedic surgery, ID, and  patient was admitted to the hospital for further evaluation and treatment   Assessment/Plan: Principal Problem:   Septic arthritis (HCC)  Right hip septic arthritis Gram-positive cocci bacteremia Infectious disease on board and currently on IV cefazolin .  Orthopedics was consulted and patient underwent right hip aspiration on 10/30/2024.  Patient then underwent I&D of the hip joint and underwent irrigation and debridement of the right hip on 10/31/2024.    Intraoperative cultures showing gram-positive cocci.  ID recommended TTE which showed LV ejection fraction of 55 to 60% with no regional wall motion abnormality and no mention of vegetation.  Plan for TEE at this time.  Follow orthopedic and ID recommendations.  On aspirin  twice daily for DVT prophylaxis.  Patient is weightbearing as tolerated.  Essential  hypertension Patient is on losartan -hydrochlorothiazide  50-12.5mg  daily at home.  Blood pressure stable at this time.   OSA Continue CPAP at nighttime.   Hyponatremia. Mild improved.  Sodium level of 139  DVT prophylaxis: enoxaparin  (LOVENOX ) injection 40 mg Start: 10/30/24 0930 SCDs Start: 10/30/24 9083   Disposition: PT OT recommends CIR at this time  Status is: Inpatient Remains inpatient appropriate because: Pending clinical improvement, need for rehabilitation    Code Status:     Code Status: Limited: Do not attempt resuscitation (DNR) -DNR-LIMITED -Do Not Intubate/DNI   Family Communication:  Spoke with the patient's spouse at bedside on 11/03/2024.  Consultants: Orthopedics Infectious disease  Procedures: Right hip aspiration 10/30/2024 Irrigation and debridement of the right hip on 10/31/2024  Anti-infectives:  Cefazolin  IV  Anti-infectives (From admission, onward)    Start     Dose/Rate Route Frequency Ordered Stop   11/01/24 1000  terbinafine  (LAMISIL ) tablet 250 mg        250 mg Oral Every Mon 10/30/24 0811     10/31/24 1738  vancomycin  (VANCOCIN ) powder  Status:  Discontinued          As needed 10/31/24 1738 10/31/24 1827   10/30/24 2300  vancomycin  (VANCOCIN ) IVPB 1000 mg/200 mL premix  Status:  Discontinued        1,000 mg 200 mL/hr over 60 Minutes Intravenous Every 12 hours 10/30/24 0959 10/30/24 1002   10/30/24 1000  vancomycin  (VANCOREADY) IVPB 1750 mg/350 mL  Status:  Discontinued        1,750 mg 175 mL/hr over 120 Minutes Intravenous  Once 10/30/24 0952 10/30/24 1002   10/30/24 1000  ceFAZolin  (  ANCEF ) IVPB 2g/100 mL premix        2 g 200 mL/hr over 30 Minutes Intravenous Every 8 hours 10/30/24 0957     10/29/24 2245  terbinafine  (LAMISIL ) tablet 250 mg  Status:  Discontinued        250 mg Oral Every 7 days 10/29/24 2235 10/30/24 0810        Subjective: Today, patient was seen and examined at bedside.  Patient states that he feels a little  better today.  Denies any fever chills nausea vomiting shortness of breath or chest pain.  He is n.p.o. for TEE today.   Objective: Vitals:   11/03/24 0447 11/03/24 0809  BP: 119/60 (!) 124/55  Pulse: 67 67  Resp: 19 17  Temp: 98.3 F (36.8 C) 98 F (36.7 C)  SpO2: 95% 94%   No intake or output data in the 24 hours ending 11/03/24 1034  There were no vitals filed for this visit. There is no height or weight on file to calculate BMI.   Physical Exam:  General:  Average built, not in obvious distress, alert awake and oriented, HENT:   No scleral pallor or icterus noted. Oral mucosa is moist.  Chest:  Clear breath sounds.  Diminished breath sounds bilaterally. No crackles or wheezes.  CVS: S1 &S2 heard. No murmur.  Regular rate and rhythm. Abdomen: Soft, nontender, nondistended.  Bowel sounds are heard.   Extremities: No cyanosis, clubbing or edema.  Peripheral pulses are palpable.  Right hip with difficulty movement of the hip Psych: Alert, awake and oriented, normal mood CNS:  No cranial nerve deficits.  Moves all extremities Skin: Warm and dry.  No rashes noted.   Data Review: I have personally reviewed the following laboratory data and studies,  CBC: Recent Labs  Lab 10/29/24 1225 10/30/24 1000 10/31/24 0013 11/01/24 0555 11/03/24 0508  WBC 12.9* 12.3* 14.4* 14.9* 10.0  NEUTROABS 10.0* 9.6*  --  13.1*  --   HGB 15.3 14.4 13.8 13.2 13.1  HCT 44.4 40.5 38.9* 37.8* 37.8*  MCV 94.3 92.9 91.5 92.4 94.7  PLT 124* 106* 133* 165 248   Basic Metabolic Panel: Recent Labs  Lab 10/29/24 1225 10/30/24 1000 10/31/24 0013 11/03/24 0508  NA 132* 134* 135 139  K 3.5 3.6 3.5 4.0  CL 93* 100 99 103  CO2 27 26 25 28   GLUCOSE 114* 165* 184* 105*  BUN 20 22 25* 23  CREATININE 1.01 0.88  0.87 0.88 0.66  CALCIUM 8.3* 7.9* 8.3* 8.5*   Liver Function Tests: Recent Labs  Lab 10/29/24 1225 10/30/24 1000  AST 48* 51*  ALT 55* 50*  ALKPHOS 98 103  BILITOT 1.8* 1.2  PROT  7.1 5.8*  ALBUMIN 3.1* 2.5*   No results for input(s): LIPASE, AMYLASE in the last 168 hours. No results for input(s): AMMONIA in the last 168 hours. Cardiac Enzymes: Recent Labs  Lab 10/30/24 1000  CKTOTAL 148   BNP (last 3 results) No results for input(s): BNP in the last 8760 hours.  ProBNP (last 3 results) No results for input(s): PROBNP in the last 8760 hours.  CBG: Recent Labs  Lab 11/02/24 0559  GLUCAP 149*   Recent Results (from the past 240 hours)  Blood Culture (routine x 2)     Status: Abnormal   Collection Time: 10/29/24  5:09 PM   Specimen: BLOOD LEFT ARM  Result Value Ref Range Status   Specimen Description BLOOD LEFT ARM  Final   Special Requests  Final    BOTTLES DRAWN AEROBIC AND ANAEROBIC Blood Culture adequate volume   Culture  Setup Time   Final    GRAM POSITIVE COCCI IN BOTH AEROBIC AND ANAEROBIC BOTTLES CRITICAL RESULT CALLED TO, READ BACK BY AND VERIFIED WITH: PHARMD M. MITCHELL 1212 AT 0955, ADC Performed at [facility], [address] [place], [place] [address]    Culture STAPHYLOCOCCUS AUREUS (A)  Final   Report Status 11/01/2024 FINAL  Final   Organism ID, Bacteria STAPHYLOCOCCUS AUREUS  Final      Susceptibility   Staphylococcus aureus - MIC*    CIPROFLOXACIN <=0.5 SENSITIVE Sensitive     ERYTHROMYCIN <=0.25 SENSITIVE Sensitive     GENTAMICIN <=0.5 SENSITIVE Sensitive     OXACILLIN 0.5 SENSITIVE Sensitive     TETRACYCLINE <=1 SENSITIVE Sensitive     VANCOMYCIN  1 SENSITIVE Sensitive     TRIMETH/SULFA <=10 SENSITIVE Sensitive     CLINDAMYCIN <=0.25 SENSITIVE Sensitive     RIFAMPIN <=0.5 SENSITIVE Sensitive     Inducible Clindamycin NEGATIVE Sensitive     LINEZOLID 2 SENSITIVE Sensitive     * STAPHYLOCOCCUS AUREUS  Blood Culture ID Panel (Reflexed)     Status: Abnormal   Collection Time: 10/29/24  5:09 PM  Result Value Ref Range Status   Enterococcus faecalis NOT DETECTED NOT DETECTED Final   Enterococcus Faecium  NOT DETECTED NOT DETECTED Final   Listeria monocytogenes NOT DETECTED NOT DETECTED Final   Staphylococcus species DETECTED (A) NOT DETECTED Final    Comment: CRITICAL RESULT CALLED TO, READ BACK BY AND VERIFIED WITH: PHARMD M. MITCHELL 1212 AT 0955, ADC    Staphylococcus aureus (BCID) DETECTED (A) NOT DETECTED Final    Comment: CRITICAL RESULT CALLED TO, READ BACK BY AND VERIFIED WITH: PHARMD M. MITCHELL 1212 AT 0955, ADC    Staphylococcus epidermidis NOT DETECTED NOT DETECTED Final   Staphylococcus lugdunensis NOT DETECTED NOT DETECTED Final   Streptococcus species NOT DETECTED NOT DETECTED Final   Streptococcus agalactiae NOT DETECTED NOT DETECTED Final   Streptococcus pneumoniae NOT DETECTED NOT DETECTED Final   Streptococcus pyogenes NOT DETECTED NOT DETECTED Final   A.calcoaceticus-baumannii NOT DETECTED NOT DETECTED Final   Bacteroides fragilis NOT DETECTED NOT DETECTED Final   Enterobacterales NOT DETECTED NOT DETECTED Final   Enterobacter cloacae complex NOT DETECTED NOT DETECTED Final   Escherichia coli NOT DETECTED NOT DETECTED Final   Klebsiella aerogenes NOT DETECTED NOT DETECTED Final   Klebsiella oxytoca NOT DETECTED NOT DETECTED Final   Klebsiella pneumoniae NOT DETECTED NOT DETECTED Final   Proteus species NOT DETECTED NOT DETECTED Final   Salmonella species NOT DETECTED NOT DETECTED Final   Serratia marcescens NOT DETECTED NOT DETECTED Final   Haemophilus influenzae NOT DETECTED NOT DETECTED Final   Neisseria meningitidis NOT DETECTED NOT DETECTED Final   Pseudomonas aeruginosa NOT DETECTED NOT DETECTED Final   Stenotrophomonas maltophilia NOT DETECTED NOT DETECTED Final   Candida albicans NOT DETECTED NOT DETECTED Final   Candida auris NOT DETECTED NOT DETECTED Final   Candida glabrata NOT DETECTED NOT DETECTED Final   Candida krusei NOT DETECTED NOT DETECTED Final   Candida parapsilosis NOT DETECTED NOT DETECTED Final   Candida tropicalis NOT DETECTED NOT  DETECTED Final   Cryptococcus neoformans/gattii NOT DETECTED NOT DETECTED Final   Meth resistant mecA/C and MREJ NOT DETECTED NOT DETECTED Final    Comment: Performed at [facility], [address], [place], [place] [address]  Blood Culture (routine x 2)  Status: Abnormal   Collection Time: 10/29/24  5:14 PM   Specimen: BLOOD RIGHT ARM  Result Value Ref Range Status   Specimen Description BLOOD RIGHT ARM  Final   Special Requests   Final    BOTTLES DRAWN AEROBIC AND ANAEROBIC Blood Culture adequate volume   Culture  Setup Time   Final    GRAM POSITIVE COCCI IN BOTH AEROBIC AND ANAEROBIC BOTTLES CRITICAL VALUE NOTED.  VALUE IS CONSISTENT WITH PREVIOUSLY REPORTED AND CALLED VALUE.    Culture (A)  Final    STAPHYLOCOCCUS AUREUS SUSCEPTIBILITIES PERFORMED ON PREVIOUS CULTURE WITHIN THE LAST 5 DAYS. Performed at [facility], [address] [place], [place] [address]    Report Status 11/01/2024 FINAL  Final  Resp panel by RT-PCR (RSV, Flu A&B, Covid) Anterior Nasal Swab     Status: None   Collection Time: 10/29/24  5:33 PM   Specimen: Anterior Nasal Swab  Result Value Ref Range Status   SARS Coronavirus 2 by RT PCR NEGATIVE NEGATIVE Final   Influenza A by PCR NEGATIVE NEGATIVE Final   Influenza B by PCR NEGATIVE NEGATIVE Final    Comment: (NOTE) The Xpert Xpress SARS-CoV-2/FLU/RSV plus assay is intended as an aid in the diagnosis of influenza from Nasopharyngeal swab specimens and should not be used as a sole basis for treatment. Nasal washings and aspirates are unacceptable for Xpert Xpress SARS-CoV-2/FLU/RSV testing.  Fact Sheet for Patients: bloggercourse.com  Fact Sheet for Healthcare Providers: seriousbroker.it  This test is not yet approved or cleared by the [place]  FDA and has been authorized for detection and/or diagnosis of SARS-CoV-2 by FDA under an Emergency Use Authorization (EUA). This EUA will  remain in effect (meaning this test can be used) for the duration of the COVID-19 declaration under Section 564(b)(1) of the Act, 21 U.S.C. section 360bbb-3(b)(1), unless the authorization is terminated or revoked.     Resp Syncytial Virus by PCR NEGATIVE NEGATIVE Final    Comment: (NOTE) Fact Sheet for Patients: bloggercourse.com  Fact Sheet for Healthcare Providers: seriousbroker.it  This test is not yet approved or cleared by the [place]  FDA and has been authorized for detection and/or diagnosis of SARS-CoV-2 by FDA under an Emergency Use Authorization (EUA). This EUA will remain in effect (meaning this test can be used) for the duration of the COVID-19 declaration under Section 564(b)(1) of the Act, 21 U.S.C. section 360bbb-3(b)(1), unless the authorization is terminated or revoked.  Performed at [facility], [address], [place], [place] [address]   Urine Culture     Status: None   Collection Time: 10/30/24  7:49 AM   Specimen: Urine, Clean Catch  Result Value Ref Range Status   Specimen Description URINE, CLEAN CATCH  Final   Special Requests NONE  Final   Culture   Final    NO GROWTH Performed at [facility], [address], [place], [place] [address]    Report Status 10/31/2024 FINAL  Final  Culture, blood (Routine X 2) w Reflex to ID Panel     Status: None (Preliminary result)   Collection Time: 10/31/24 12:15 AM   Specimen: BLOOD RIGHT HAND  Result Value Ref Range Status   Specimen Description BLOOD RIGHT HAND  Final   Special Requests   Final    BOTTLES DRAWN AEROBIC AND ANAEROBIC Blood Culture adequate volume   Culture   Final    NO GROWTH 3 DAYS Performed at [facility], [address] [place], [place] [address]    Report Status PENDING  Incomplete  Culture, blood (Routine X 2) w Reflex to ID Panel     Status: None (Preliminary result)   Collection Time: 10/31/24 12:26 AM    Specimen: BLOOD RIGHT ARM  Result Value Ref Range Status   Specimen Description BLOOD RIGHT ARM  Final   Special Requests   Final    BOTTLES DRAWN AEROBIC AND ANAEROBIC Blood Culture adequate volume   Culture   Final    NO GROWTH 3 DAYS Performed at [facility], [address] [place], [place] [address]    Report Status PENDING  Incomplete  Aerobic/Anaerobic Culture w Gram Stain (surgical/deep wound)     Status: None (Preliminary result)   Collection Time: 10/31/24  5:45 PM   Specimen: Synovial, Right Hip; Body Fluid  Result Value Ref Range Status   Specimen Description SYNOVIAL  Final   Special Requests RIGHT HIP  Final   Gram Stain   Final    MODERATE GRAM POSITIVE COCCI RARE GRAM POSITIVE COCCI Performed at [facility], [address], [place], [place] [address]    Culture   Final    RARE STAPHYLOCOCCUS AUREUS NO ANAEROBES ISOLATED; CULTURE IN PROGRESS FOR 5 DAYS    Report Status PENDING  Incomplete   Organism ID, Bacteria STAPHYLOCOCCUS AUREUS  Final      Susceptibility   Staphylococcus aureus - MIC*    CIPROFLOXACIN <=0.5 SENSITIVE Sensitive     ERYTHROMYCIN <=0.25 SENSITIVE Sensitive     GENTAMICIN <=0.5 SENSITIVE Sensitive     OXACILLIN 0.5 SENSITIVE Sensitive     TETRACYCLINE <=1 SENSITIVE Sensitive     VANCOMYCIN  1 SENSITIVE Sensitive     TRIMETH/SULFA <=10 SENSITIVE Sensitive     CLINDAMYCIN <=0.25 SENSITIVE Sensitive     RIFAMPIN <=0.5 SENSITIVE Sensitive     Inducible Clindamycin NEGATIVE Sensitive     LINEZOLID 2 SENSITIVE Sensitive     * RARE STAPHYLOCOCCUS AUREUS     Studies: CT HEAD WO CONTRAST ( ) Result Date: 11/02/2024 EXAM: CT HEAD WITHOUT CONTRAST 11/02/2024 03:36:33 PM TECHNIQUE: CT of the head was performed without the administration of intravenous contrast. Automated exposure control, iterative reconstruction, and/or weight based adjustment of the mA/kV was utilized to reduce the radiation dose to as low as reasonably achievable.  COMPARISON: None available. CLINICAL HISTORY: Waxing and waning confusion FINDINGS: BRAIN AND VENTRICLES: No acute hemorrhage. No evidence of acute infarct. No hydrocephalus. No extra-axial collection. No mass effect or midline shift. Periventricular and subcortical white matter hypoattenuation, consistent with moderate chronic ischemic microvascular disease. Calcified atherosclerotic plaque within cavernous/supraclinoid internal carotid arteries. ORBITS: No acute abnormality. SINUSES: No acute abnormality. SOFT TISSUES AND SKULL: No acute soft tissue abnormality. No skull fracture. IMPRESSION: 1. No acute intracranial abnormality. Electronically signed by: Donnice Mania MD 11/02/2024 09:20 PM EST RP Workstation: [id number]      Vernal Alstrom, MD  [facility] 11/03/2024  If 7PM-7AM, please contact night-coverage

## 2024-11-03 NOTE — Progress Notes (Signed)
 Occupational Therapy Treatment Patient Details Name: Duane Greene MRN: [id number] DOB: 10/05/1951 Today's Date: 11/03/2024   History of present illness Duane Greene is a 73 y.o. male who p/w R hip joint swelling and imaging/arthrocentesis c/f R hip septic arthritis. Past medical history significant of HTN, HLD, OSA (on CPAP), and OA b/l hips s/p US  guided steroid injection on 12/2.   OT comments  Pt is progressing well towards OT goals and remains highly motivated to engage in acute therapies. Focus of session on progressing functional transfers and increasing independent engagement in ADL tasks. Pt required up to total A for posterior peri care bed level and in standing. Up to Max A bed mobility transitioning to Min A +2 safety with transfer to recliner. Pt continues to benefit from acute skilled OT services to facilitate progress towards goals. Continue per POC.       If plan is discharge home, recommend the following:  Two people to help with walking and/or transfers;Two people to help with bathing/dressing/bathroom;Assistance with cooking/housework;Assist for transportation;Help with stairs or ramp for entrance   Equipment Recommendations  Wheelchair (measurements OT);Wheelchair cushion (measurements OT)    Recommendations for Other Services Rehab consult    Precautions / Restrictions Precautions Precautions: Fall Recall of Precautions/Restrictions: Intact Restrictions Weight Bearing Restrictions Per Provider Order: Yes RLE Weight Bearing Per Provider Order: Weight bearing as tolerated       Mobility Bed Mobility Overal bed mobility: Needs Assistance Bed Mobility: Rolling, Sidelying to Sit Rolling: Mod assist, Max assist Sidelying to sit: Max assist       General bed mobility comments: Mod/Max A to roll for prolonged pericare. Max A for trunk elevation.    Transfers Overall transfer level: Needs assistance Equipment used: Rolling walker (2 wheels) Transfers: Sit  to/from Stand, Bed to chair/wheelchair/BSC Sit to Stand: Mod assist, From elevated surface     Step pivot transfers: Min assist, +2 physical assistance, +2 safety/equipment     General transfer comment: ModA to power up to stand from edge of bed with bed slightly elevated. +2 Min A to step pivot to chair. Pt with improved mobility this session. Requires light verbal cues for alignment with recliner prior to sitting. Decreased control of descent onto recliner requiring Mod A.     Balance Overall balance assessment: Needs assistance Sitting-balance support: Single extremity supported, Feet supported Sitting balance-Leahy Scale: Fair Sitting balance - Comments: Cannot tolerate change. L lateral lean to off load hip Postural control: Left lateral lean Standing balance support: Bilateral upper extremity supported, During functional activity, Reliant on assistive device for balance Standing balance-Leahy Scale: Poor Standing balance comment: Dependent on RW and external support                           ADL either performed or assessed with clinical judgement   ADL Overall ADL's : Needs assistance/impaired                     Lower Body Dressing: Maximal assistance Lower Body Dressing Details (indicate cue type and reason): Sit EOB     Toileting- Clothing Manipulation and Hygiene: Total assistance;Sit to/from stand;Bed level Toileting - Clothing Manipulation Details (indicate cue type and reason): Pt soiled in bed with BM. Completed posterior peri care bed level with Pt rolling to L and R sides. Pt with additional active BM at beginning of supine to sit transfer. Posterior completed for 2nd BM with Pt in standing.  Extremity/Trunk Assessment Upper Extremity Assessment Upper Extremity Assessment: Overall WFL for tasks assessed            Vision       Perception     Praxis     Communication Communication Communication: No apparent difficulties    Cognition Arousal: Alert Behavior During Therapy: WFL for tasks assessed/performed Cognition: No apparent impairments                               Following commands: Intact        Cueing   Cueing Techniques: Verbal cues, Tactile cues, Visual cues  Exercises      Shoulder Instructions       General Comments VSS on RA. Pt wife present and encouraging throughout session.    Pertinent Vitals/ Pain       Pain Assessment Pain Assessment: Faces Faces Pain Scale: Hurts even more Pain Location: R hip Pain Descriptors / Indicators: Discomfort, Grimacing, Guarding Pain Intervention(s): Limited activity within patient's tolerance, Monitored during session, Repositioned  Home Living                                          Prior Functioning/Environment              Frequency  Min 2X/week        Progress Toward Goals  OT Goals(current goals can now be found in the care plan section)  Progress towards OT goals: Progressing toward goals  Acute Rehab OT Goals Patient Stated Goal: to get better OT Goal Formulation: With patient Time For Goal Achievement: 11/15/24 Potential to Achieve Goals: Good ADL Goals Pt Will Perform Lower Body Bathing: with modified independence;with adaptive equipment;sitting/lateral leans Pt Will Perform Lower Body Dressing: with min assist;with adaptive equipment;sitting/lateral leans Pt Will Transfer to Toilet: with contact guard assist;stand pivot transfer;bedside commode Additional ADL Goal #1: Pt will tolerate 5 minutes of ADL task OOB with Min A to address decreased activity tolerance.  Plan      Co-evaluation    PT/OT/SLP Co-Evaluation/Treatment: Yes Reason for Co-Treatment: For patient/therapist safety;To address functional/ADL transfers   OT goals addressed during session: ADL's and self-care;Proper use of Adaptive equipment and DME      AM-PAC OT 6 Clicks Daily Activity     Outcome  Measure   Help from another person eating meals?: A Little Help from another person taking care of personal grooming?: A Little Help from another person toileting, which includes using toliet, bedpan, or urinal?: A Lot Help from another person bathing (including washing, rinsing, drying)?: A Lot Help from another person to put on and taking off regular upper body clothing?: A Little Help from another person to put on and taking off regular lower body clothing?: A Lot 6 Click Score: 15    End of Session Equipment Utilized During Treatment: Gait belt;Rolling walker (2 wheels)  OT Visit Diagnosis: Unsteadiness on feet (R26.81);Muscle weakness (generalized) (M62.81);Pain Pain - Right/Left: Right Pain - part of body: Hip   Activity Tolerance Patient tolerated treatment well   Patient Left in chair;with call bell/phone within reach;with chair alarm set   Nurse Communication Mobility status        Time: 8461-8390 OT Time Calculation (min): 31 min  Charges: OT General Charges $OT Visit: 1 Visit OT Treatments $Self Care/Home Management : 8-22 mins  Duane Greene, OTR/LSABRA  [facility]  Office: [phone]   Duane Greene 11/03/2024, 4:45 PM

## 2024-11-03 NOTE — Plan of Care (Signed)
 ?  Problem: Education: ?Goal: Knowledge of General Education information will improve ?Description: Including pain rating scale, medication(s)/side effects and non-pharmacologic comfort measures ?Outcome: Progressing ?  ?Problem: Health Behavior/Discharge Planning: ?Goal: Ability to manage health-related needs will improve ?Outcome: Progressing ?  ?Problem: Coping: ?Goal: Level of anxiety will decrease ?Outcome: Progressing ?  ?

## 2024-11-03 NOTE — Anesthesia Postprocedure Evaluation (Signed)
 Anesthesia Post Note  Patient: Duane Greene  Procedure(s) Performed: TRANSESOPHAGEAL ECHOCARDIOGRAM     Patient location during evaluation: PACU Anesthesia Type: MAC Level of consciousness: awake and alert Pain management: pain level controlled Vital Signs Assessment: post-procedure vital signs reviewed and stable Respiratory status: spontaneous breathing, nonlabored ventilation, respiratory function stable and patient connected to nasal cannula oxygen Cardiovascular status: stable and blood pressure returned to baseline Postop Assessment: no apparent nausea or vomiting Anesthetic complications: no   No notable events documented.  Last Vitals:  Vitals:   11/03/24 1300 11/03/24 1327  BP: (!) 123/57 123/61  Pulse: 72 69  Resp: (!) 21 18  Temp:  36.8 C  SpO2: 93% 96%    Last Pain:  Vitals:   11/03/24 1346  TempSrc:   PainSc: 7                  Keagan Anthis P Jahlisa Rossitto

## 2024-11-03 NOTE — Progress Notes (Signed)
°  Echocardiogram Echocardiogram Transesophageal has been performed.  Duane Greene, RDCS 11/03/2024, 12:30 PM

## 2024-11-04 ENCOUNTER — Other Ambulatory Visit: Payer: Self-pay

## 2024-11-04 ENCOUNTER — Encounter (HOSPITAL_COMMUNITY): Payer: Self-pay | Admitting: Physical Medicine & Rehabilitation

## 2024-11-04 ENCOUNTER — Inpatient Hospital Stay (HOSPITAL_COMMUNITY)
Admission: AD | Admit: 2024-11-04 | Discharge: 2024-11-19 | DRG: 945 | Disposition: A | Discharge status: Home / Self Care | Source: Intra-hospital | Attending: Physical Medicine & Rehabilitation | Admitting: Physical Medicine & Rehabilitation

## 2024-11-04 DIAGNOSIS — R7401 Elevation of levels of liver transaminase levels: Secondary | ICD-10-CM | POA: Diagnosis not present

## 2024-11-04 DIAGNOSIS — M0000 Staphylococcal arthritis, unspecified joint: Secondary | ICD-10-CM | POA: Diagnosis not present

## 2024-11-04 DIAGNOSIS — Z96611 Presence of right artificial shoulder joint: Secondary | ICD-10-CM | POA: Diagnosis present

## 2024-11-04 DIAGNOSIS — E785 Hyperlipidemia, unspecified: Secondary | ICD-10-CM | POA: Diagnosis present

## 2024-11-04 DIAGNOSIS — R195 Other fecal abnormalities: Secondary | ICD-10-CM | POA: Diagnosis not present

## 2024-11-04 DIAGNOSIS — E871 Hypo-osmolality and hyponatremia: Secondary | ICD-10-CM | POA: Diagnosis present

## 2024-11-04 DIAGNOSIS — N401 Enlarged prostate with lower urinary tract symptoms: Secondary | ICD-10-CM | POA: Diagnosis present

## 2024-11-04 DIAGNOSIS — D649 Anemia, unspecified: Secondary | ICD-10-CM | POA: Diagnosis present

## 2024-11-04 DIAGNOSIS — R5381 Other malaise: Principal | ICD-10-CM | POA: Diagnosis present

## 2024-11-04 DIAGNOSIS — Z823 Family history of stroke: Secondary | ICD-10-CM

## 2024-11-04 DIAGNOSIS — R7881 Bacteremia: Secondary | ICD-10-CM | POA: Diagnosis present

## 2024-11-04 DIAGNOSIS — I10 Essential (primary) hypertension: Secondary | ICD-10-CM | POA: Diagnosis present

## 2024-11-04 DIAGNOSIS — Z86711 Personal history of pulmonary embolism: Secondary | ICD-10-CM

## 2024-11-04 DIAGNOSIS — I38 Endocarditis, valve unspecified: Secondary | ICD-10-CM | POA: Diagnosis present

## 2024-11-04 DIAGNOSIS — M009 Pyogenic arthritis, unspecified: Principal | ICD-10-CM | POA: Diagnosis present

## 2024-11-04 DIAGNOSIS — K5900 Constipation, unspecified: Secondary | ICD-10-CM | POA: Diagnosis present

## 2024-11-04 DIAGNOSIS — B9561 Methicillin susceptible Staphylococcus aureus infection as the cause of diseases classified elsewhere: Secondary | ICD-10-CM | POA: Diagnosis present

## 2024-11-04 DIAGNOSIS — G8918 Other acute postprocedural pain: Secondary | ICD-10-CM | POA: Diagnosis not present

## 2024-11-04 DIAGNOSIS — Z79899 Other long term (current) drug therapy: Secondary | ICD-10-CM

## 2024-11-04 DIAGNOSIS — M1711 Unilateral primary osteoarthritis, right knee: Secondary | ICD-10-CM | POA: Diagnosis not present

## 2024-11-04 DIAGNOSIS — R3915 Urgency of urination: Secondary | ICD-10-CM | POA: Diagnosis present

## 2024-11-04 DIAGNOSIS — R350 Frequency of micturition: Secondary | ICD-10-CM | POA: Diagnosis not present

## 2024-11-04 DIAGNOSIS — Z8249 Family history of ischemic heart disease and other diseases of the circulatory system: Secondary | ICD-10-CM

## 2024-11-04 DIAGNOSIS — G4733 Obstructive sleep apnea (adult) (pediatric): Secondary | ICD-10-CM | POA: Diagnosis present

## 2024-11-04 DIAGNOSIS — I059 Rheumatic mitral valve disease, unspecified: Secondary | ICD-10-CM | POA: Diagnosis not present

## 2024-11-04 DIAGNOSIS — M00051 Staphylococcal arthritis, right hip: Secondary | ICD-10-CM | POA: Diagnosis not present

## 2024-11-04 MED ORDER — ZINC SULFATE 220 (50 ZN) MG PO CAPS
220.0000 mg | ORAL_CAPSULE | Freq: Every day | ORAL | Status: AC
  Administered 2024-11-04 – 2024-11-17 (×14): 220 mg via ORAL
  Filled 2024-11-04 (×11): qty 1

## 2024-11-04 MED ORDER — SENNOSIDES-DOCUSATE SODIUM 8.6-50 MG PO TABS
2.0000 | ORAL_TABLET | Freq: Two times a day (BID) | ORAL | Status: DC
  Administered 2024-11-04 – 2024-11-06 (×5): 2 via ORAL
  Filled 2024-11-04 (×6): qty 2

## 2024-11-04 MED ORDER — ACETAMINOPHEN 500 MG PO TABS
1000.0000 mg | ORAL_TABLET | Freq: Three times a day (TID) | ORAL | Status: DC
  Administered 2024-11-04 – 2024-11-19 (×42): 1000 mg via ORAL
  Filled 2024-11-04 (×31): qty 2

## 2024-11-04 MED ORDER — SENNOSIDES-DOCUSATE SODIUM 8.6-50 MG PO TABS: 2.0000 | ORAL_TABLET | Freq: Two times a day (BID) | ORAL | Status: DC

## 2024-11-04 MED ORDER — BISACODYL 10 MG RE SUPP: 10.0000 mg | Freq: Every day | RECTAL | Status: DC | PRN

## 2024-11-04 MED ORDER — ENOXAPARIN SODIUM 40 MG/0.4ML IJ SOSY
40.0000 mg | PREFILLED_SYRINGE | INTRAMUSCULAR | Status: DC
  Administered 2024-11-05 – 2024-11-19 (×15): 40 mg via SUBCUTANEOUS
  Filled 2024-11-04 (×10): qty 0.4

## 2024-11-04 MED ORDER — LIDOCAINE 5 % EX PTCH
1.0000 | MEDICATED_PATCH | CUTANEOUS | Status: DC
  Administered 2024-11-04: 22:00:00 1 via TRANSDERMAL
  Filled 2024-11-04 (×3): qty 1

## 2024-11-04 MED ORDER — SIMVASTATIN 20 MG PO TABS
40.0000 mg | ORAL_TABLET | Freq: Every evening | ORAL | Status: DC
  Administered 2024-11-04 – 2024-11-18 (×15): 40 mg via ORAL
  Filled 2024-11-04 (×11): qty 2

## 2024-11-04 MED ORDER — CEFAZOLIN IV (FOR PTA / DISCHARGE USE ONLY): 2.0000 g | Freq: Three times a day (TID) | INTRAVENOUS | 0 refills | Status: DC

## 2024-11-04 MED ORDER — OXYCODONE HCL 5 MG PO TABS
5.0000 mg | ORAL_TABLET | ORAL | Status: DC | PRN
  Administered 2024-11-04 – 2024-11-09 (×11): 5 mg via ORAL
  Filled 2024-11-04 (×11): qty 1

## 2024-11-04 MED ORDER — PROCHLORPERAZINE MALEATE 5 MG PO TABS: 5.0000 mg | ORAL_TABLET | Freq: Four times a day (QID) | ORAL | Status: DC | PRN

## 2024-11-04 MED ORDER — CHLORHEXIDINE GLUCONATE CLOTH 2 % EX PADS
6.0000 | MEDICATED_PAD | Freq: Two times a day (BID) | CUTANEOUS | Status: DC
  Administered 2024-11-04 – 2024-11-19 (×28): 6 via TOPICAL

## 2024-11-04 MED ORDER — METHOCARBAMOL 500 MG PO TABS
500.0000 mg | ORAL_TABLET | Freq: Three times a day (TID) | ORAL | Status: DC
  Administered 2024-11-04 – 2024-11-19 (×44): 500 mg via ORAL
  Filled 2024-11-04 (×31): qty 1

## 2024-11-04 MED ORDER — LOSARTAN POTASSIUM 50 MG PO TABS
50.0000 mg | ORAL_TABLET | Freq: Every day | ORAL | Status: DC
  Administered 2024-11-05 – 2024-11-19 (×15): 50 mg via ORAL
  Filled 2024-11-04 (×10): qty 2

## 2024-11-04 MED ORDER — TRAZODONE HCL 50 MG PO TABS
25.0000 mg | ORAL_TABLET | Freq: Every evening | ORAL | Status: DC | PRN
  Administered 2024-11-04 – 2024-11-06 (×2): 50 mg via ORAL
  Filled 2024-11-04 (×2): qty 1

## 2024-11-04 MED ORDER — POLYETHYLENE GLYCOL 3350 17 G PO PACK: 17.0000 g | PACK | Freq: Two times a day (BID) | ORAL | Status: DC

## 2024-11-04 MED ORDER — FLEET ENEMA RE ENEM: 1.0000 | ENEMA | Freq: Once | RECTAL | Status: DC | PRN

## 2024-11-04 MED ORDER — ENOXAPARIN SODIUM 40 MG/0.4ML IJ SOSY: 40.0000 mg | PREFILLED_SYRINGE | INTRAMUSCULAR | Status: DC

## 2024-11-04 MED ORDER — ADULT MULTIVITAMIN W/MINERALS CH
1.0000 | ORAL_TABLET | Freq: Every day | ORAL | Status: DC
  Administered 2024-11-04 – 2024-11-18 (×15): 1 via ORAL
  Filled 2024-11-04 (×11): qty 1

## 2024-11-04 MED ORDER — ACETAMINOPHEN 500 MG PO TABS: 1000.0000 mg | ORAL_TABLET | Freq: Three times a day (TID) | ORAL | Status: DC

## 2024-11-04 MED ORDER — PROCHLORPERAZINE EDISYLATE 10 MG/2ML IJ SOLN: 5.0000 mg | Freq: Four times a day (QID) | INTRAMUSCULAR | Status: DC | PRN

## 2024-11-04 MED ORDER — VITAMIN D 25 MCG (1000 UNIT) PO TABS
1000.0000 [IU] | ORAL_TABLET | Freq: Every day | ORAL | Status: DC
  Administered 2024-11-05 – 2024-11-19 (×15): 1000 [IU] via ORAL
  Filled 2024-11-04 (×10): qty 1

## 2024-11-04 MED ORDER — METHOCARBAMOL 500 MG PO TABS: 500.0000 mg | ORAL_TABLET | Freq: Three times a day (TID) | ORAL | Status: DC

## 2024-11-04 MED ORDER — GUAIFENESIN-DM 100-10 MG/5ML PO SYRP: 5.0000 mL | ORAL_SOLUTION | Freq: Four times a day (QID) | ORAL | Status: DC | PRN

## 2024-11-04 MED ORDER — PROCHLORPERAZINE 25 MG RE SUPP: 12.5000 mg | Freq: Four times a day (QID) | RECTAL | Status: DC | PRN

## 2024-11-04 MED ORDER — ALUM & MAG HYDROXIDE-SIMETH 200-200-20 MG/5ML PO SUSP: 30.0000 mL | ORAL | Status: DC | PRN

## 2024-11-04 MED ORDER — ACETAMINOPHEN 325 MG PO TABS
650.0000 mg | ORAL_TABLET | ORAL | Status: DC | PRN
  Administered 2024-11-06: 650 mg via ORAL
  Filled 2024-11-04: qty 2

## 2024-11-04 MED ORDER — DIPHENHYDRAMINE HCL 25 MG PO CAPS: 25.0000 mg | ORAL_CAPSULE | Freq: Four times a day (QID) | ORAL | Status: DC | PRN

## 2024-11-04 MED ORDER — GABAPENTIN 300 MG PO CAPS: 300.0000 mg | ORAL_CAPSULE | Freq: Three times a day (TID) | ORAL | Status: DC

## 2024-11-04 MED ORDER — GABAPENTIN 300 MG PO CAPS
300.0000 mg | ORAL_CAPSULE | Freq: Three times a day (TID) | ORAL | Status: DC
  Administered 2024-11-04 – 2024-11-12 (×23): 300 mg via ORAL
  Filled 2024-11-04 (×23): qty 1

## 2024-11-04 MED ORDER — LIDOCAINE 5 % EX PTCH: 1.0000 | MEDICATED_PATCH | CUTANEOUS | Status: DC

## 2024-11-04 MED ORDER — TERBINAFINE HCL 250 MG PO TABS
250.0000 mg | ORAL_TABLET | ORAL | Status: DC
  Administered 2024-11-08 – 2024-11-15 (×2): 250 mg via ORAL
  Filled 2024-11-04: qty 1

## 2024-11-04 MED ORDER — POLYETHYLENE GLYCOL 3350 17 G PO PACK
17.0000 g | PACK | Freq: Two times a day (BID) | ORAL | Status: DC
  Administered 2024-11-05 – 2024-11-06 (×3): 17 g via ORAL
  Filled 2024-11-04 (×7): qty 1

## 2024-11-04 MED ORDER — CEFAZOLIN SODIUM-DEXTROSE 2-4 GM/100ML-% IV SOLN
2.0000 g | Freq: Three times a day (TID) | INTRAVENOUS | Status: DC
  Administered 2024-11-04 – 2024-11-19 (×45): 2 g via INTRAVENOUS
  Filled 2024-11-04 (×32): qty 100

## 2024-11-04 MED ORDER — HYDROCHLOROTHIAZIDE 12.5 MG PO TABS
12.5000 mg | ORAL_TABLET | Freq: Every day | ORAL | Status: DC
  Administered 2024-11-05 – 2024-11-19 (×15): 12.5 mg via ORAL
  Filled 2024-11-04 (×10): qty 1

## 2024-11-04 NOTE — Progress Notes (Signed)
 Physical Therapy Treatment Patient Details Name: Boubacar L Erny MRN: [id number] DOB: 09/10/1951 Today's Date: 11/04/2024   History of Present Illness Jai L Piche is a 73 y.o. male who p/w R hip joint swelling and imaging/arthrocentesis c/f R hip septic arthritis. Past medical history significant of HTN, HLD, OSA (on CPAP), and OA b/l hips s/p US  guided steroid injection on 12/2.    PT Comments  Pt making excellent progress towards his physical therapy goals this session with improved pain control, activity tolerance and ambulation distance. Pt participating in AAROM/AROM exercises at bed level. Requiring min assist (+2 safety) for functional mobility. Pt ambulating 15 ft x 2 with RW and step to gait pattern. Seated rest break in between bouts. Patient will benefit from intensive inpatient follow-up therapy, >3 hours/day.    If plan is discharge home, recommend the following: A lot of help with walking and/or transfers;A lot of help with bathing/dressing/bathroom   Can travel by private vehicle        Equipment Recommendations  Wheelchair (measurements PT);Wheelchair cushion (measurements PT);Hospital bed    Recommendations for Other Services       Precautions / Restrictions Precautions Precautions: Fall Recall of Precautions/Restrictions: Intact Restrictions Weight Bearing Restrictions Per Provider Order: Yes RLE Weight Bearing Per Provider Order: Weight bearing as tolerated     Mobility  Bed Mobility Overal bed mobility: Needs Assistance Bed Mobility: Supine to Sit   Sidelying to sit: Max assist Supine to sit: Min assist     General bed mobility comments: RLE management, significantly increased time, cues for use of bed rail and sequencing    Transfers Overall transfer level: Needs assistance Equipment used: Rolling walker (2 wheels), Ambulation equipment used Transfers: Sit to/from Stand, Bed to chair/wheelchair/BSC Sit to Stand: From elevated surface, Min assist            General transfer comment: Rocking forward to gain momentum, cues for hand placement, minA to power up to standing    Ambulation/Gait Ambulation/Gait assistance: Min assist, +2 safety/equipment Gait Distance (Feet): 15 Feet (15, 15) Assistive device: Rolling walker (2 wheels) Gait Pattern/deviations: Decreased stride length, Step-to pattern, Trunk flexed, Decreased weight shift to left, Decreased dorsiflexion - left, Decreased stance time - left Gait velocity: decreased Gait velocity interpretation: <1.31 ft/sec, indicative of household ambulator   General Gait Details: Verbal cues for sequencing/technique, upright posture. Pt with increased R foot external rotation (pt spouse states is baseline). Pt with step to gait pattern.   Stairs             Wheelchair Mobility     Tilt Bed    Modified Rankin (Stroke Patients Only)       Balance Overall balance assessment: Needs assistance Sitting-balance support: Single extremity supported, Feet supported Sitting balance-Leahy Scale: Fair     Standing balance support: Bilateral upper extremity supported, During functional activity, Reliant on assistive device for balance Standing balance-Leahy Scale: Poor Standing balance comment: Dependent on RW and external support                            Communication Communication Communication: No apparent difficulties  Cognition Arousal: Alert Behavior During Therapy: WFL for tasks assessed/performed   PT - Cognitive impairments: No apparent impairments                         Following commands: Intact      Cueing Cueing Techniques: Verbal  cues, Tactile cues, Visual cues  Exercises General Exercises - Lower Extremity Ankle Circles/Pumps: AROM, Both, 20 reps, Supine Quad Sets: AROM, Both, 10 reps, Supine Short Arc Quad: AROM, Right, Supine, 15 reps Heel Slides: AAROM, 10 reps, Supine, AROM, Both Hip ABduction/ADduction: AAROM, Right, 10  reps, Supine    General Comments        Pertinent Vitals/Pain Pain Assessment Pain Assessment: Faces Faces Pain Scale: Hurts little more Pain Location: R hip Pain Descriptors / Indicators: Discomfort, Grimacing, Guarding Pain Intervention(s): Monitored during session    Home Living                          Prior Function            PT Goals (current goals can now be found in the care plan section) Progress towards PT goals: Progressing toward goals    Frequency    Min 3X/week      PT Plan      Co-evaluation              AM-PAC PT 6 Clicks Mobility   Outcome Measure  Help needed turning from your back to your side while in a flat bed without using bedrails?: A Little Help needed moving from lying on your back to sitting on the side of a flat bed without using bedrails?: A Little Help needed moving to and from a bed to a chair (including a wheelchair)?: A Little Help needed standing up from a chair using your arms (e.g., wheelchair or bedside chair)?: A Little Help needed to walk in hospital room?: A Little Help needed climbing 3-5 steps with a railing? : Total 6 Click Score: 16    End of Session Equipment Utilized During Treatment: Gait belt Activity Tolerance: Patient tolerated treatment well Patient left: with call bell/phone within reach;in bed;with bed alarm set Nurse Communication: Mobility status PT Visit Diagnosis: Other abnormalities of gait and mobility (R26.89)     Time: 9147-9077 PT Time Calculation (min) (ACUTE ONLY): 30 min  Charges:    $Gait Training: 8-22 mins $Therapeutic Activity: 8-22 mins PT General Charges $$ ACUTE PT VISIT: 1 Visit                     Aleck Daring, PT, DPT Acute Rehabilitation Services Office [phone]    Aleck ONEIDA Daring 11/04/2024, 10:33 AM

## 2024-11-04 NOTE — Progress Notes (Signed)
 Babs Arthea DASEN, MD  Physician Physical Medicine and Rehabilitation   Consult Note    Signed   Date of Service: 11/02/2024 10:41 AM  Related encounter: ED to Hosp-Admission (Discharged) from 10/29/2024 in [facility]   Signed     Expand All Collapse All  Show:Clear all [x] Written[x] Templated[] Copied  Added by: [x] Babs Arthea DASEN, MD  [] Hover for details          Physical Medicine and Rehabilitation Consult Reason for Consult: Impaired functional mobility Referring Physician: Pokhrel     HPI: Duane Greene is a 73 y.o. male with a history of hypertension, obstructive sleep apnea on CPAP, bilateral OA of the hips who had recent steroid injections in early December.  He presented to the hospital on 10/30/2024 with right hip swelling and warmth.  Workup notable for low-grade temperature as well as leukocytosis and elevated inflammatory markers.  Orthopedic surgery was consulted and the patient underwent an I&D of his right hip on 10/31/2024.  Intraoperative cultures demonstrated gram-positive cocci.  ID was consulted and recommended TEE which showed no signs of valve vegetation.  Patient remains on IV cefazolin  for Staph aureus.  Patient has been up with therapy and yesterday was mod assist for sit to stand.  He did not ambulate.  Gait trials pending for today.  Patient was independent and driving prior to this admission.  He lives with his spouse in a 1 level house with 3 steps to enter.       Home: Home Living Family/patient expects to be discharged to:: Private residence Living Arrangements: Spouse/significant other Available Help at Discharge: Family, Available 24 hours/day Type of Home: House Home Access: Stairs to enter Secretary/administrator of Steps: 3 Entrance Stairs-Rails: None Home Layout: One level Bathroom Shower/Tub: Health Visitor: Handicapped height Bathroom Accessibility: No Home Equipment:  Agric[employer] Consultant (2 wheels), [employer] - single point, Information systems manager - built in, [employer] held shower head, BSC/3in1  Functional History: Prior Function Prior Level of Function : Independent/Modified Independent, Driving Mobility Comments: Prior to last week, pt was ind with no AD however since then has been barely mobile with RW due to hip pain. ADLs Comments: Ind prior to last week Functional Status:  Mobility: Bed Mobility Overal bed mobility: Needs Assistance Bed Mobility: Supine to Sit Supine to sit: Mod assist, +2 for physical assistance, +2 for safety/equipment, HOB elevated, Used rails Sit to supine: Mod assist General bed mobility comments: Mod A +2 to come to EOB. Increased assistance to manage RLE off of bed. Transfers Overall transfer level: Needs assistance Equipment used: Rolling walker (2 wheels) Transfers: Sit to/from Stand, Bed to chair/wheelchair/BSC Sit to Stand: Mod assist, +2 physical assistance, +2 safety/equipment, From elevated surface Bed to/from chair/wheelchair/BSC transfer type:: Via Lift equipment Step pivot transfers: Mod assist, +2 physical assistance, +2 safety/equipment General transfer comment: Mod A +2 and verbal cues for proper hand placement on RW to rise from bed slightly elevated. Pt able to step pivot to recliner on L side with Mod A +2. Increased time and occassional verbal cue required. Ambulation/Gait General Gait Details: Unable due to pain   ADL: ADL Overall ADL's : Needs assistance/impaired Eating/Feeding: Set up, Sitting Grooming: Wash/dry face, Set up, Sitting Upper Body Bathing: Minimal assistance, Sitting Lower Body Bathing: Maximal assistance Upper Body Dressing : Set up, Sitting Lower Body Dressing: Maximal assistance Toilet Transfer: Moderate assistance, +2 for physical assistance, +2 for safety/equipment, BSC/3in1, Rolling walker (2 wheels) Toileting- Clothing  Manipulation and Hygiene: Total assistance Toileting - Clothing Manipulation  Details (indicate cue type and reason): external catheter   Cognition: Cognition Orientation Level: Oriented X4 Cognition Arousal: Alert Behavior During Therapy: WFL for tasks assessed/performed     Review of Systems  Constitutional:  Positive for fever.  HENT: Negative.    Eyes: Negative.   Respiratory: Negative.    Cardiovascular: Negative.   Gastrointestinal: Negative.   Genitourinary:  Negative for dysuria.  Musculoskeletal:  Positive for back pain, joint pain and myalgias.  Skin: Negative.   Neurological:  Positive for focal weakness and weakness. Negative for sensory change.  Psychiatric/Behavioral:  Negative for depression.        Past Medical History:  Diagnosis Date   Arthritis     History of kidney stones     Hyperlipidemia     Hypertension     Right inguinal hernia     Sleep apnea      uses CPAP nightly             Past Surgical History:  Procedure Laterality Date   CHOLECYSTECTOMY N/A 06/13/2020    Procedure: LAPAROSCOPIC CHOLECYSTECTOMY;  Surgeon: Belinda Cough, MD;  Location: [facility] OR;  Service: General;  Laterality: N/A;   COLONOSCOPY       EXTRACORPOREAL SHOCK WAVE LITHOTRIPSY Left 10/18/2019    Procedure: EXTRACORPOREAL SHOCK WAVE LITHOTRIPSY (ESWL);  Surgeon: Renda Glance, MD;  Location: [facility] ORS;  Service: Urology;  Laterality: Left;   INCISION AND DRAINAGE OF WOUND Right 10/31/2024    Procedure: IRRIGATION AND DEBRIDEMENT WOUND;  Surgeon: Georgina Ozell LABOR, MD;  Location[facility] OR;  Service: Orthopedics;  Laterality: Right;   INGUINAL HERNIA REPAIR Right 06/30/2019    Procedure: RIGHT INGUINAL HERNIA REPAIR WITH MESH;  Surgeon: Belinda Cough, MD;  Location: [facility];  Service: General;  Laterality: Right;   INTRAOPERATIVE CHOLANGIOGRAM N/A 06/13/2020    Procedure: INTRAOPERATIVE CHOLANGIOGRAM;  Surgeon: Belinda Cough, MD;  Location: [facility] OR;  Service: General;  Laterality: N/A;   KNEE ARTHROSCOPY Left     LUMBAR LAMINECTOMY/DECOMPRESSION  MICRODISCECTOMY N/A 08/28/2020    Procedure: Laminectomy - Lumbar One-Two, Lumbar Two-Three, Lumbar Three-Four, Lumbar Four-Five, Lumbar Five-Sacral One;  Surgeon: Joshua Alm RAMAN, MD;  Location: [facility] OR;  Service: Neurosurgery;  Laterality: N/A;  posterior   REVERSE SHOULDER ARTHROPLASTY Right 09/06/2022    Procedure: REVERSE Right SHOULDER ARTHROPLASTY;  Surgeon: Kay Kemps, MD;  Location: [facility] ORS;  Service: Orthopedics;  Laterality: Right;  120 hours choice and interscalene block   SHOULDER ARTHROSCOPY Bilateral     TONSILLECTOMY                 Family History  Problem Relation Age of Onset   Heart Problems Father     CVA Father          Social History:  reports that he has never smoked. He has never used smokeless tobacco. He reports that he does not drink alcohol and does not use drugs. Allergies: [Allergies]  [Allergies] No Known Allergies       Medications Prior to Admission  Medication Sig Dispense Refill   aspirin  EC 81 MG tablet Take 81 mg by mouth at bedtime. Swallow whole.       cholecalciferol  (VITAMIN D3) 25 MCG (1000 UNIT) tablet Take 1,000 Units by mouth daily.       gabapentin  (NEURONTIN ) 300 MG capsule Take 1 capsule (300 mg total) by mouth at bedtime. 90 capsule 3   losartan -hydrochlorothiazide  (HYZAAR ) 50-12.5  MG tablet Take 1 tablet by mouth daily. 100 tablet 3   Multiple Vitamins-Minerals (MULTIVITAMIN WITH MINERALS) tablet Take 1 tablet by mouth at bedtime.       oxyCODONE  (OXY IR/ROXICODONE ) 5 MG immediate release tablet Take 1 tablet (5 mg total) by mouth every 6 (six) hours as needed for pain for 7 days. 28 tablet 0   simvastatin  (ZOCOR ) 40 MG tablet Take 1 tablet (40 mg total) by mouth every evening. (Patient taking differently: Take 40 mg by mouth at bedtime.) 100 tablet 3   terbinafine  (LAMISIL ) 250 MG tablet Take 1 tablet (250 mg total) by mouth every 7 (seven) days to prevent onychomycosis. (Patient taking differently: Take 250 mg by mouth every Monday.)  12 tablet 2   triamcinolone  cream (KENALOG ) 0.1 % Apply 1 Application topically daily as needed. 15 g 3            Blood pressure (!) 113/52, pulse 71, temperature 99.4 F (37.4 C), resp. rate 17, SpO2 97%. Physical Exam Constitutional:      General: He is not in acute distress. HENT:     Head: Normocephalic.     Right Ear: External ear normal.     Left Ear: External ear normal.     Nose: Nose normal.  Eyes:     Extraocular Movements: Extraocular movements intact.     Conjunctiva/sclera: Conjunctivae normal.  Cardiovascular:     Rate and Rhythm: Normal rate.  Pulmonary:     Effort: Pulmonary effort is normal.  Abdominal:     Palpations: Abdomen is soft.  Musculoskeletal:        General: Swelling and tenderness (Right hip and thigh) present.     Cervical back: Normal range of motion.     Right lower leg: Edema present.  Skin:    General: Skin is warm.  Neurological:     Mental Status: He is alert.     Comments: Alert and oriented x 3. Normal insight and awareness. Intact Memory. Normal language and speech. Cranial nerve exam unremarkable. MMT: BUE 4+ to 5/5. RLE limited by pain proximally, 4/5 ADF/PF.LLE 3/5 HF, KE and 4+ to 5/5 ADF/PF. SABRA    Psychiatric:        Mood and Affect: Mood normal.        Behavior: Behavior normal.       Lab Results Last 24 Hours       Results for orders placed or performed during the hospital encounter of 10/29/24 (from the past 24 hours)  Glucose, capillary     Status: Abnormal    Collection Time: 11/02/24  5:59 AM  Result Value Ref Range    Glucose-Capillary 149 (H) 70 - 99 mg/dL      Imaging Results (Last 48 hours)  ECHOCARDIOGRAM COMPLETE Result Date: 11/01/2024    ECHOCARDIOGRAM REPORT   Patient Name:   Duane Greene Date of Exam: 11/01/2024 Medical Rec #:  [id number]      Height:       70.0 in Accession #:    [id number]     Weight:       215.0 lb Date of Birth:  [birth year]      BSA:          2.152 m Patient Age:    73 years        BP:           105/51 mmHg Patient Gender: M  HR:           65 bpm. Exam Location:  Inpatient Procedure: 2D Echo, Cardiac Doppler and Color Doppler (Both Spectral and Color            Flow Doppler were utilized during procedure). Indications:    Bacteremia R78.81  History:        Patient has prior history of Echocardiogram examinations, most                 recent 02/01/2023. Signs/Symptoms:Hypertensive Heart Disease.  Sonographer:    Nathanel Devonshire Referring Phys: [id number] MICHAEL A MOORE IMPRESSIONS  1. Left ventricular ejection fraction, by estimation, is 55 to 60%. Left ventricular ejection fraction by 2D MOD biplane is 59.5 %. The left ventricle has normal function. The left ventricle has no regional wall motion abnormalities. Left ventricular diastolic parameters were normal.  2. Right ventricular systolic function is normal. The right ventricular size is normal. There is normal pulmonary artery systolic pressure.  3. The mitral valve is normal in structure. No evidence of mitral valve regurgitation. No evidence of mitral stenosis.  4. The aortic valve is normal in structure. Aortic valve regurgitation is not visualized. No aortic stenosis is present. Comparison(s): No significant change from prior study. FINDINGS  Left Ventricle: Left ventricular ejection fraction, by estimation, is 55 to 60%. Left ventricular ejection fraction by 2D MOD biplane is 59.5 %. The left ventricle has normal function. The left ventricle has no regional wall motion abnormalities. The left ventricular internal cavity size was normal in size. There is no left ventricular hypertrophy. Left ventricular diastolic parameters were normal. Right Ventricle: The right ventricular size is normal. No increase in right ventricular wall thickness. Right ventricular systolic function is normal. There is normal pulmonary artery systolic pressure. The tricuspid regurgitant velocity is 2.71 m/s, and  with an assumed right atrial pressure of 3  mmHg, the estimated right ventricular systolic pressure is 32.4 mmHg. Left Atrium: Left atrial size was normal in size. Right Atrium: Right atrial size was normal in size. Pericardium: There is no evidence of pericardial effusion. Mitral Valve: The mitral valve is normal in structure. No evidence of mitral valve regurgitation. No evidence of mitral valve stenosis. Tricuspid Valve: The tricuspid valve is normal in structure. Tricuspid valve regurgitation is not demonstrated. No evidence of tricuspid stenosis. Aortic Valve: The aortic valve is normal in structure. Aortic valve regurgitation is not visualized. No aortic stenosis is present. Aortic valve mean gradient measures 3.0 mmHg. Aortic valve peak gradient measures 7.0 mmHg. Aortic valve area, by VTI measures 3.03 cm. Pulmonic Valve: The pulmonic valve was not well visualized. Pulmonic valve regurgitation is not visualized. No evidence of pulmonic stenosis. Aorta: The aortic root is normal in size and structure. Venous: The inferior vena cava was not well visualized. IAS/Shunts: The interatrial septum was not well visualized.  LEFT VENTRICLE PLAX 2D                        Biplane EF (MOD) LVIDd:         5.10 cm         LV Biplane EF:   Left LVIDs:         3.40 cm                          ventricular LV PW:         1.00 cm  ejection LV IVS:        0.90 cm                          fraction by LVOT diam:     2.10 cm                          2D MOD LV SV:         80                               biplane is LV SV Index:   37                               59.5 %. LVOT Area:     3.46 cm LV IVRT:       77 msec         Diastology                                LV e' medial:    9.25 cm/s                                LV E/e' medial:  9.7 LV Volumes (MOD)               LV e' lateral:   9.14 cm/s LV vol d, MOD    93.1 ml       LV E/e' lateral: 9.8 A2C: LV vol d, MOD    68.6 ml A4C: LV vol s, MOD    33.9 ml A2C: LV vol s, MOD    29.5 ml A4C: LV SV  MOD A2C:   59.2 ml LV SV MOD A4C:   68.6 ml LV SV MOD BP:    47.4 ml RIGHT VENTRICLE RV Basal diam:  3.50 cm     PULMONARY VEINS RV S prime:     17.10 cm/s  Diastolic Velocity: 29.00 cm/s TAPSE (M-mode): 2.7 cm      S/D Velocity:       1.60                             Systolic Velocity:  45.30 cm/s LEFT ATRIUM             Index        RIGHT ATRIUM           Index LA diam:        3.80 cm 1.77 cm/m   RA Area:     17.70 cm LA Vol (A2C):   44.5 ml 20.67 ml/m  RA Volume:   48.80 ml  22.67 ml/m LA Vol (A4C):   37.2 ml 17.28 ml/m LA Biplane Vol: 40.7 ml 18.91 ml/m  AORTIC VALVE AV Area (Vmax):    2.91 cm AV Area (Vmean):   3.16 cm AV Area (VTI):     3.03 cm AV Vmax:           132.00 cm/s AV Vmean:          78.000 cm/s AV VTI:            0.264 m AV Peak  Grad:      7.0 mmHg AV Mean Grad:      3.0 mmHg LVOT Vmax:         111.00 cm/s LVOT Vmean:        71.200 cm/s LVOT VTI:          0.231 m LVOT/AV VTI ratio: 0.88  AORTA Ao Root diam: 3.50 cm Ao Asc diam:  2.90 cm MITRAL VALVE               TRICUSPID VALVE MV Area (PHT): 3.34 cm    TR Peak grad:   29.4 mmHg MV Decel Time: 227 msec    TR Vmax:        271.00 cm/s MV E velocity: 89.50 cm/s MV A velocity: 93.40 cm/s  SHUNTS MV E/A ratio:  0.96        Systemic VTI:  0.23 m                            Systemic Diam: 2.10 cm Joelle Cedars Tonleu Electronically signed by Joelle Cedars Tonleu Signature Date/Time: 11/01/2024/10:35:36 AM    Final         Assessment/Plan: Diagnosis: 73 year old male with septic right hip status post I&D. Does the need for close, 24 hr/day medical supervision in concert with the patient's rehab needs make it unreasonable for this patient to be served in a less intensive setting? Yes Co-Morbidities requiring supervision/potential complications:  - Infectious disease considerations -Wound care -Pain control -Obstructive sleep apnea on CPAP -Essential hypertension Due to bladder management, bowel management, safety, skin/wound care,  disease management, medication administration, pain management, and patient education, does the patient require 24 hr/day rehab nursing? Yes Does the patient require coordinated care of a physician, rehab nurse, therapy disciplines of PT, OT to address physical and functional deficits in the context of the above medical diagnosis(es)? Yes Addressing deficits in the following areas: balance, endurance, locomotion, strength, transferring, bowel/bladder control, bathing, dressing, feeding, grooming, toileting, and psychosocial support Can the patient actively participate in an intensive therapy program of at least 3 hrs of therapy per day at least 5 days per week? Yes The potential for patient to make measurable gains while on inpatient rehab is excellent Anticipated functional outcomes upon discharge from inpatient rehab are modified independent  with PT, modified independent and supervision with OT, n/a with SLP. Estimated rehab length of stay to reach the above functional goals is: 10-14 days Anticipated discharge destination: Home Overall Rehab/Functional Prognosis: excellent   POST ACUTE RECOMMENDATIONS: This patient's condition is appropriate for continued rehabilitative care in the following setting: CIR Patient has agreed to participate in recommended program. Yes Note that insurance prior authorization may be required for reimbursement for recommended care.   Comment: Pt has supportive wife and has been motivated to work through pain. He and his wife were very active at home PTA.       I have personally performed a face to face diagnostic evaluation of this patient. Additionally, I have examined the patient's medical record including any pertinent labs and radiographic images.     Thanks,   Arthea ONEIDA Gunther, MD 11/02/2024            Routing History

## 2024-11-04 NOTE — Progress Notes (Signed)
 Pt arrived to 4MW07, a/ox4, vital signs stable.

## 2024-11-04 NOTE — Progress Notes (Signed)
 [facility]  Date of Admission:  11/04/2024     Reason for Follow Up: Septic arthritis (HCC)  Total days of antibiotics 6         ASSESSMENT:  Duane Greene is a 73 y/o caucasian male presenting with worsening right hip pain and found to have MSSA bacteremia with right hip septic arthritis now s/p debridement.  Mr. Mich blood cultures from 10/31/24 remain without growth to date.  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  Discussed plan of care to continue current dose of cefazolin  for a total of 6 weeks treating for bacteremia, septic arthritis, and endocarditis.  Place PICC line.  Home health/OPAT orders.  Disposition pending and may be to inpatient rehabilitation.  Continue to monitor cultures until finalized.  Okay for discharge from ID standpoint once PICC line is placed and home health arranged.  Continue standard/universal precautions.  Will arrange follow-up in ID clinic following discharge.  Remaining medical and supportive care per internal medicine.  PLAN:  Continue current dose of cefazolin  through 12/12/24. Place PICC line. Continue postoperative care per orthopedics. Home health/OPAT orders. Okay for discharge from ID standpoint pending PICC line placement and home health arrangements although may be going to inpatient rehab. Continue standard/universal precautions. Follow-up in ID clinic. Remaining medical and supportive care per internal medicine.  Diagnosis: MSSA bacteremia, right hip septic arthritis, and mitral valve endocarditis   Allergies[1]  OPAT Orders Discharge antibiotics to be given via PICC line Discharge antibiotics: Cefazolin  2 g IV every 8 hours  Per pharmacy protocol   Duration: 6 weeks  End Date: 12/12/24  [facility] Care Per Protocol:  Home health RN for IV administration and teaching; PICC line care and labs.    Labs weekly while on IV antibiotics: _X_ CBC with differential __  BMP _X_ CMP __X CRP __X ESR __ Vancomycin  trough __ CK  _X_ Please pull PIC at completion of IV antibiotics __ Please leave PIC in place until doctor has seen patient or been notified  Fax weekly labs to [phone]  Clinic Follow Up Appt:  12/03/23 at 9:30 am with Dr. Dennise    Principal Problem:   Septic arthritis [facility]) Active Problems:   MSSA bacteremia   Endocarditis of mitral valve    acetaminophen   1,000 mg Oral TID   Chlorhexidine  Gluconate Cloth  6 each Topical Q12H   [START ON 11/05/2024] cholecalciferol   1,000 Units Oral Daily   [START ON 11/05/2024] enoxaparin  (LOVENOX ) injection  40 mg Subcutaneous Q24H   gabapentin   300 mg Oral TID   [START ON 11/05/2024] losartan   50 mg Oral Daily   And   [START ON 11/05/2024] hydrochlorothiazide   12.5 mg Oral Daily   lidocaine   1 patch Transdermal Q24H   methocarbamol   500 mg Oral TID   multivitamin with minerals  1 tablet Oral QHS   polyethylene glycol  17 g Oral BID   senna-docusate  2 tablet Oral BID   simvastatin   40 mg Oral QPM   [START ON 11/08/2024] terbinafine   250 mg Oral Q Mon   zinc  sulfate (50mg  elemental zinc )  220 mg Oral Q supper    SUBJECTIVE:  Afebrile overnight with no acute events. Tolerating antibiotics with no adverse side effects. Wife visiting.   Allergies[2]   Review of Systems: Review of Systems  Constitutional:  Negative for chills, fever and weight loss.  Respiratory:  Negative for cough, shortness of breath and wheezing.  Cardiovascular:  Negative for chest pain and leg swelling.  Gastrointestinal:  Negative for abdominal pain, constipation, diarrhea, nausea and vomiting.  Skin:  Negative for rash.      OBJECTIVE: Vitals:   11/04/24 1410  BP: (!) 131/51  Pulse: 69  Resp: 16  Temp: 98.8 F (37.1 C)  TempSrc: Oral  SpO2: 99%  Weight: 91.8 kg  Height: 5' 7 (1.702 m)   Body mass index is 31.7 kg/m.  Physical Exam Constitutional:      General: He is not in acute  distress.    Appearance: He is well-developed.  Cardiovascular:     Rate and Rhythm: Normal rate and regular rhythm.     Heart sounds: Normal heart sounds.  Pulmonary:     Effort: Pulmonary effort is normal.     Breath sounds: Normal breath sounds.  Skin:    General: Skin is warm and dry.  Neurological:     Mental Status: He is alert and oriented to person, place, and time.     Lab Results Lab Results  Component Value Date   WBC 10.0 11/03/2024   HGB 13.1 11/03/2024   HCT 37.8 (L) 11/03/2024   MCV 94.7 11/03/2024   PLT 248 11/03/2024    Lab Results  Component Value Date   CREATININE 0.66 11/03/2024   BUN 23 11/03/2024   NA 139 11/03/2024   K 4.0 11/03/2024   CL 103 11/03/2024   CO2 28 11/03/2024    Lab Results  Component Value Date   ALT 50 (H) 10/30/2024   AST 51 (H) 10/30/2024   ALKPHOS 103 10/30/2024   BILITOT 1.2 10/30/2024     Microbiology: Recent Results (from the past 240 hours)  Blood Culture (routine x 2)     Status: Abnormal   Collection Time: 10/29/24  5:09 PM   Specimen: BLOOD LEFT ARM  Result Value Ref Range Status   Specimen Description BLOOD LEFT ARM  Final   Special Requests   Final    BOTTLES DRAWN AEROBIC AND ANAEROBIC Blood Culture adequate volume   Culture  Setup Time   Final    GRAM POSITIVE COCCI IN BOTH AEROBIC AND ANAEROBIC BOTTLES CRITICAL RESULT CALLED TO, READ BACK BY AND VERIFIED WITH: PHARMD M. MITCHELL 1212 AT 0955, ADC Performed at [facility], [address], [place], [place] [address]    Culture STAPHYLOCOCCUS AUREUS (A)  Final   Report Status 11/01/2024 FINAL  Final   Organism ID, Bacteria STAPHYLOCOCCUS AUREUS  Final      Susceptibility   Staphylococcus aureus - MIC*    CIPROFLOXACIN <=0.5 SENSITIVE Sensitive     ERYTHROMYCIN <=0.25 SENSITIVE Sensitive     GENTAMICIN <=0.5 SENSITIVE Sensitive     OXACILLIN 0.5 SENSITIVE Sensitive     TETRACYCLINE <=1 SENSITIVE Sensitive     VANCOMYCIN  1 SENSITIVE Sensitive      TRIMETH/SULFA <=10 SENSITIVE Sensitive     CLINDAMYCIN <=0.25 SENSITIVE Sensitive     RIFAMPIN <=0.5 SENSITIVE Sensitive     Inducible Clindamycin NEGATIVE Sensitive     LINEZOLID 2 SENSITIVE Sensitive     * STAPHYLOCOCCUS AUREUS  Blood Culture ID Panel (Reflexed)     Status: Abnormal   Collection Time: 10/29/24  5:09 PM  Result Value Ref Range Status   Enterococcus faecalis NOT DETECTED NOT DETECTED Final   Enterococcus Faecium NOT DETECTED NOT DETECTED Final   Listeria monocytogenes NOT DETECTED NOT DETECTED Final   Staphylococcus species DETECTED (A) NOT DETECTED Final  Comment: CRITICAL RESULT CALLED TO, READ BACK BY AND VERIFIED WITH: PHARMD M. MITCHELL 1212 AT 0955, ADC    Staphylococcus aureus (BCID) DETECTED (A) NOT DETECTED Final    Comment: CRITICAL RESULT CALLED TO, READ BACK BY AND VERIFIED WITH: PHARMD M. MITCHELL 1212 AT 0955, ADC    Staphylococcus epidermidis NOT DETECTED NOT DETECTED Final   Staphylococcus lugdunensis NOT DETECTED NOT DETECTED Final   Streptococcus species NOT DETECTED NOT DETECTED Final   Streptococcus agalactiae NOT DETECTED NOT DETECTED Final   Streptococcus pneumoniae NOT DETECTED NOT DETECTED Final   Streptococcus pyogenes NOT DETECTED NOT DETECTED Final   A.calcoaceticus-baumannii NOT DETECTED NOT DETECTED Final   Bacteroides fragilis NOT DETECTED NOT DETECTED Final   Enterobacterales NOT DETECTED NOT DETECTED Final   Enterobacter cloacae complex NOT DETECTED NOT DETECTED Final   Escherichia coli NOT DETECTED NOT DETECTED Final   Klebsiella aerogenes NOT DETECTED NOT DETECTED Final   Klebsiella oxytoca NOT DETECTED NOT DETECTED Final   Klebsiella pneumoniae NOT DETECTED NOT DETECTED Final   Proteus species NOT DETECTED NOT DETECTED Final   Salmonella species NOT DETECTED NOT DETECTED Final   Serratia marcescens NOT DETECTED NOT DETECTED Final   Haemophilus influenzae NOT DETECTED NOT DETECTED Final   Neisseria meningitidis NOT DETECTED  NOT DETECTED Final   Pseudomonas aeruginosa NOT DETECTED NOT DETECTED Final   Stenotrophomonas maltophilia NOT DETECTED NOT DETECTED Final   Candida albicans NOT DETECTED NOT DETECTED Final   Candida auris NOT DETECTED NOT DETECTED Final   Candida glabrata NOT DETECTED NOT DETECTED Final   Candida krusei NOT DETECTED NOT DETECTED Final   Candida parapsilosis NOT DETECTED NOT DETECTED Final   Candida tropicalis NOT DETECTED NOT DETECTED Final   Cryptococcus neoformans/gattii NOT DETECTED NOT DETECTED Final   Meth resistant mecA/C and MREJ NOT DETECTED NOT DETECTED Final    Comment: Performed at [facility], [address] [place], [place] [address]  Blood Culture (routine x 2)     Status: Abnormal   Collection Time: 10/29/24  5:14 PM   Specimen: BLOOD RIGHT ARM  Result Value Ref Range Status   Specimen Description BLOOD RIGHT ARM  Final   Special Requests   Final    BOTTLES DRAWN AEROBIC AND ANAEROBIC Blood Culture adequate volume   Culture  Setup Time   Final    GRAM POSITIVE COCCI IN BOTH AEROBIC AND ANAEROBIC BOTTLES CRITICAL VALUE NOTED.  VALUE IS CONSISTENT WITH PREVIOUSLY REPORTED AND CALLED VALUE.    Culture (A)  Final    STAPHYLOCOCCUS AUREUS SUSCEPTIBILITIES PERFORMED ON PREVIOUS CULTURE WITHIN THE LAST 5 DAYS. Performed at [facility], [address] [place], [place] [address]    Report Status 11/01/2024 FINAL  Final  Resp panel by RT-PCR (RSV, Flu A&B, Covid) Anterior Nasal Swab     Status: None   Collection Time: 10/29/24  5:33 PM   Specimen: Anterior Nasal Swab  Result Value Ref Range Status   SARS Coronavirus 2 by RT PCR NEGATIVE NEGATIVE Final   Influenza A by PCR NEGATIVE NEGATIVE Final   Influenza B by PCR NEGATIVE NEGATIVE Final    Comment: (NOTE) The Xpert Xpress SARS-CoV-2/FLU/RSV plus assay is intended as an aid in the diagnosis of influenza from Nasopharyngeal swab specimens and should not be used as a sole basis for treatment. Nasal  washings and aspirates are unacceptable for Xpert Xpress SARS-CoV-2/FLU/RSV testing.  Fact Sheet for Patients: bloggercourse.com  Fact Sheet for Healthcare Providers: seriousbroker.it  This  test is not yet approved or cleared by the [place]  FDA and has been authorized for detection and/or diagnosis of SARS-CoV-2 by FDA under an Emergency Use Authorization (EUA). This EUA will remain in effect (meaning this test can be used) for the duration of the COVID-19 declaration under Section 564(b)(1) of the Act, 21 U.S.C. section 360bbb-3(b)(1), unless the authorization is terminated or revoked.     Resp Syncytial Virus by PCR NEGATIVE NEGATIVE Final    Comment: (NOTE) Fact Sheet for Patients: bloggercourse.com  Fact Sheet for Healthcare Providers: seriousbroker.it  This test is not yet approved or cleared by the [place]  FDA and has been authorized for detection and/or diagnosis of SARS-CoV-2 by FDA under an Emergency Use Authorization (EUA). This EUA will remain in effect (meaning this test can be used) for the duration of the COVID-19 declaration under Section 564(b)(1) of the Act, 21 U.S.C. section 360bbb-3(b)(1), unless the authorization is terminated or revoked.  Performed at [facility], [address], [place], [place] [address]   Urine Culture     Status: None   Collection Time: 10/30/24  7:49 AM   Specimen: Urine, Clean Catch  Result Value Ref Range Status   Specimen Description URINE, CLEAN CATCH  Final   Special Requests NONE  Final   Culture   Final    NO GROWTH Performed at [facility], [address] [place], [place] [address]    Report Status 10/31/2024 FINAL  Final  Culture, blood (Routine X 2) w Reflex to ID Panel     Status: None (Preliminary result)   Collection Time: 10/31/24 12:15 AM   Specimen: BLOOD RIGHT HAND  Result Value Ref  Range Status   Specimen Description BLOOD RIGHT HAND  Final   Special Requests   Final    BOTTLES DRAWN AEROBIC AND ANAEROBIC Blood Culture adequate volume   Culture   Final    NO GROWTH 4 DAYS Performed at [facility], [address], [place], [place] [address]    Report Status PENDING  Incomplete  Culture, blood (Routine X 2) w Reflex to ID Panel     Status: None (Preliminary result)   Collection Time: 10/31/24 12:26 AM   Specimen: BLOOD RIGHT ARM  Result Value Ref Range Status   Specimen Description BLOOD RIGHT ARM  Final   Special Requests   Final    BOTTLES DRAWN AEROBIC AND ANAEROBIC Blood Culture adequate volume   Culture   Final    NO GROWTH 4 DAYS Performed at [facility], [address], [place], [place] [address]    Report Status PENDING  Incomplete  Aerobic/Anaerobic Culture w Gram Stain (surgical/deep wound)     Status: None (Preliminary result)   Collection Time: 10/31/24  5:45 PM   Specimen: Synovial, Right Hip; Body Fluid  Result Value Ref Range Status   Specimen Description SYNOVIAL  Final   Special Requests RIGHT HIP  Final   Gram Stain   Final    MODERATE GRAM POSITIVE COCCI RARE GRAM POSITIVE COCCI Performed at [facility], [address], [place], [place] [address]    Culture   Final    RARE STAPHYLOCOCCUS AUREUS NO ANAEROBES ISOLATED; CULTURE IN PROGRESS FOR 5 DAYS    Report Status PENDING  Incomplete   Organism ID, Bacteria STAPHYLOCOCCUS AUREUS  Final      Susceptibility   Staphylococcus aureus - MIC*    CIPROFLOXACIN <=0.5 SENSITIVE Sensitive  ERYTHROMYCIN <=0.25 SENSITIVE Sensitive     GENTAMICIN <=0.5 SENSITIVE Sensitive     OXACILLIN 0.5 SENSITIVE Sensitive     TETRACYCLINE <=1 SENSITIVE Sensitive     VANCOMYCIN  1 SENSITIVE Sensitive     TRIMETH/SULFA <=10 SENSITIVE Sensitive     CLINDAMYCIN <=0.25 SENSITIVE Sensitive     RIFAMPIN <=0.5 SENSITIVE Sensitive     Inducible Clindamycin NEGATIVE Sensitive     LINEZOLID 2  SENSITIVE Sensitive     * RARE STAPHYLOCOCCUS AUREUS     Greg Murray Durrell, NP [facility]  11/04/2024  3:48 PM     [1] No Known Allergies [2] No Known Allergies

## 2024-11-04 NOTE — Progress Notes (Addendum)
 Emeline Joesph BROCKS, DO  Physician Physical Medicine and Rehabilitation   PMR Pre-admission    Signed   Date of Service: 11/04/2024 12:01 PM  Related encounter: ED to Hosp-Admission (Discharged) from 10/29/2024 in [facility]   Signed     Expand All Collapse All  Show:Clear all [x] Written[x] Templated[] Copied  Added by: [x] Alison Heron MATSU, RN[x] Emeline Joesph BROCKS, DO  [] Hover for details PMR Admission Coordinator Pre-Admission Assessment   Patient: Duane Greene is an 73 y.o., male MRN: [id number] DOB: 04/24/1951 Height: 5' 7 (170.2 cm) Weight: 89.8 kg   Insurance Information HMO:     PPO: yes     PCP:      IPA:      80/20:      OTHER:  PRIMARY: [employer]      Policy#: [id number]; Medicare [id number]      Subscriber: pt CM Name: Madelin      Phone#: [phone]     Fax#: EPIC access Pre-Cert#:  [id number] Auth for CIR from Tammy  with [employer] for admit 12/18 with next review date in 7 days.  HTA has Epic access for continued stay reviews.        Employer:  Benefits:  Phone #: [phone]     Name: 12/16 Eff. Date: 11/19/23 until 11/17/24     Deduct: none      Out of Pocket Max: $3400      Life Max: none CIR: $325 co pay per day days 1 until 6      SNF: no copay per day days 1 until 20; $214 co pay per day days 21 until 100 Outpatient: $15 per visit     Co-Pay: visits per medical neccesity Home Health: 100%     Co-Pay:  DME: 75%     Co-Pay: 25% Providers: in network  SECONDARY: none      Policy#:      Phone#:    Artist:       Phone#:    The Best Boy for patients in Inpatient Rehabilitation Facilities with attached Privacy Act Statement-Health Care Records was provided and verbally reviewed with: Patient and Family   Emergency Contact Information Contact Information       Name Relation Home Work Mobile    [place] Spouse [phone]   [phone]          Other Contacts   None on File      Current Medical History  Patient Admitting Diagnosis: septic arthritis   History of Present Illness: 73 yo male with history of HTN, HLD, OSA on CPAP, and OA bilateral hips s/p guided steroid injection on 12/2 to right hip joint and imaging /arthrocentesis of right hip septic arthritis. Presented on 12/12 to ED with ongoing pain since injection along with decreased mobility. Was evaluated at [facility] ED 12/9 where xray consistent with OA. Sent home with po oxycodone  after receiving IV Toradol  with some relief. Followed up on 12/9 with Dr Delane with OP MRI ordered for possible avascular necrosis.    On 12/12 febrile. Blood cultures consistent with S. Aureus. Ortho consulted. ID consulted and placed on IV Cefazolin . Underwent right hip aspiration on 12/13. Subsequently underwent I and D of the hip joint on 12/14. Continue pain management. Intraoperative cultures showing gram-positive cocci. ID recommended TEE 12/17. Findings of mitral valve vegetation. ID follow up with course of treatment.   Patient's medical record from [facility] has been reviewed  by the rehabilitation admission coordinator and physician.   Past Medical History      Past Medical History:  Diagnosis Date   Arthritis     History of kidney stones     Hyperlipidemia     Hypertension     Right inguinal hernia     Sleep apnea      uses CPAP nightly          Has the patient had major surgery during 100 days prior to admission? Yes   Family History   family history includes CVA in his father; Heart Problems in his father.   Current Medications [Current Medications]  [Current Medications]    Current Facility-Administered Medications:    acetaminophen  (TYLENOL ) tablet 1,000 mg, 1,000 mg, Oral, TID, Moore, Michael A, MD, 1,000 mg at 11/04/24 9182   acetaminophen  (TYLENOL ) tablet 650 mg, 650 mg, Oral, Q4H PRN, Moore, Michael A, MD, 650 mg at 10/30/24 0400   ceFAZolin   (ANCEF ) IVPB 2g/100 mL premix, 2 g, Intravenous, Q8H, Georgina Ozell LABOR, MD, Last Rate: 200 mL/hr at 11/04/24 0820, 2 g at 11/04/24 0820   cholecalciferol  (VITAMIN D3) 25 MCG (1000 UNIT) tablet 1,000 Units, 1,000 Units, Oral, Daily, Georgina Ozell LABOR, MD, 1,000 Units at 11/04/24 9182   enoxaparin  (LOVENOX ) injection 40 mg, 40 mg, Subcutaneous, Q24H, Moore, Michael A, MD, 40 mg at 11/04/24 9182   gabapentin  (NEURONTIN ) capsule 300 mg, 300 mg, Oral, TID, Moore, Michael A, MD, 300 mg at 11/04/24 9182   losartan  (COZAAR ) tablet 50 mg, 50 mg, Oral, Daily, 50 mg at 11/04/24 0817 **AND** hydrochlorothiazide  (HYDRODIURIL ) tablet 12.5 mg, 12.5 mg, Oral, Daily, Moore, Michael A, MD, 12.5 mg at 11/04/24 9182   lidocaine  (LIDODERM ) 5 % 1 patch, 1 patch, Transdermal, Q24H, Georgina Ozell LABOR, MD, 1 patch at 11/03/24 2153   methocarbamol  (ROBAXIN ) tablet 500 mg, 500 mg, Oral, TID, Moore, Michael A, MD, 500 mg at 11/04/24 9182   multivitamin with minerals tablet 1 tablet, 1 tablet, Oral, QHS, Georgina Ozell LABOR, MD, 1 tablet at 11/03/24 2152   oxyCODONE  (Oxy IR/ROXICODONE ) immediate release tablet 5 mg, 5 mg, Oral, Q4H PRN, Moore, Michael A, MD, 5 mg at 11/04/24 0405   polyethylene glycol (MIRALAX  / GLYCOLAX ) packet 17 g, 17 g, Oral, BID, Georgina Ozell LABOR, MD, 17 g at 11/01/24 0915   senna-docusate (Senokot-S) tablet 2 tablet, 2 tablet, Oral, BID, Georgina Ozell LABOR, MD, 2 tablet at 11/01/24 2204   simvastatin  (ZOCOR ) tablet 40 mg, 40 mg, Oral, QPM, Moore, Michael A, MD, 40 mg at 11/03/24 1717   terbinafine  (LAMISIL ) tablet 250 mg, 250 mg, Oral, Q Mon, Moore, Michael A, MD, 250 mg at 11/01/24 9085    Patients Current Diet:  Diet Order                  Diet general             Diet regular Room service appropriate? Yes; Fluid consistency: Thin  Diet effective now                       Precautions / Restrictions Precautions Precautions: Fall Restrictions Weight Bearing Restrictions Per Provider Order:  Yes RLE Weight Bearing Per Provider Order: Weight bearing as tolerated    Has the patient had 2 or more falls or a fall with injury in the past year? No   Prior Activity Level Community (5-7x/wk): retired, Independent, active, driving   Prior Functional Level Self Care:  Did the patient need help bathing, dressing, using the toilet or eating? Independent   Indoor Mobility: Did the patient need assistance with walking from room to room (with or without device)? Independent   Stairs: Did the patient need assistance with internal or external stairs (with or without device)? Independent   Functional Cognition: Did the patient need help planning regular tasks such as shopping or remembering to take medications? Independent   Patient Information Are you of Hispanic, Latino/a,or Spanish origin?: A. No, not of Hispanic, Latino/a, or Spanish origin What is your race?: A. White Do you need or want an interpreter to communicate with a doctor or health care staff?: 0. No   Patient's Response To:  Health Literacy and Transportation Is the patient able to respond to health literacy and transportation needs?: Yes Health Literacy - How often do you need to have someone help you when you read instructions, pamphlets, or other written material from your doctor or pharmacy?: Never In the past 12 months, has lack of transportation kept you from medical appointments or from getting medications?: No In the past 12 months, has lack of transportation kept you from meetings, work, or from getting things needed for daily living?: No   Home Assistive Devices / Equipment Home Equipment: Agricultural Consultant (2 wheels), [employer] - single point, Information systems manager - built in, [employer] held shower head, BSC/3in1   Prior Device Use: Indicate devices/aids used by the patient prior to current illness, exacerbation or injury? None of the above   Current Functional Level Cognition   Orientation Level: Oriented X4    Extremity  Assessment (includes Sensation/Coordination)   Upper Extremity Assessment: Overall WFL for tasks assessed  Lower Extremity Assessment: Defer to PT evaluation RLE: Unable to fully assess due to pain     ADLs   Overall ADL's : Needs assistance/impaired Eating/Feeding: Set up, Sitting Grooming: Wash/dry face, Set up, Sitting Upper Body Bathing: Minimal assistance, Sitting Lower Body Bathing: Maximal assistance Upper Body Dressing : Set up, Sitting Lower Body Dressing: Maximal assistance Lower Body Dressing Details (indicate cue type and reason): Sit EOB Toilet Transfer: Moderate assistance, +2 for physical assistance, +2 for safety/equipment, BSC/3in1, Rolling walker (2 wheels) Toileting- Clothing Manipulation and Hygiene: Total assistance, Sit to/from stand, Bed level Toileting - Clothing Manipulation Details (indicate cue type and reason): Pt soiled in bed with BM. Completed posterior peri care bed level with Pt rolling to L and R sides. Pt with additional active BM at beginning of supine to sit transfer. Posterior completed for 2nd BM with Pt in standing.     Mobility   Overal bed mobility: Needs Assistance Bed Mobility: Supine to Sit Rolling: Mod assist, Max assist Sidelying to sit: Max assist Supine to sit: Min assist Sit to supine: Mod assist General bed mobility comments: RLE management, significantly increased time, cues for use of bed rail and sequencing     Transfers   Overall transfer level: Needs assistance Equipment used: Rolling walker (2 wheels), Ambulation equipment used Transfers: Sit to/from Stand, Bed to chair/wheelchair/BSC Sit to Stand: From elevated surface, Min assist Bed to/from chair/wheelchair/BSC transfer type:: Step pivot Step pivot transfers: Min assist, +2 physical assistance, +2 safety/equipment General transfer comment: Rocking forward to gain momentum, cues for hand placement, minA to power up to standing     Ambulation / Gait / Stairs / Wheelchair  Mobility   Ambulation/Gait Ambulation/Gait assistance: Min assist, +2 safety/equipment Gait Distance (Feet): 15 Feet (15, 15) Assistive device: Rolling walker (2 wheels) Gait  Pattern/deviations: Decreased stride length, Step-to pattern, Trunk flexed, Decreased weight shift to left, Decreased dorsiflexion - left, Decreased stance time - left General Gait Details: Verbal cues for sequencing/technique, upright posture. Pt with increased R foot external rotation (pt spouse states is baseline). Pt with step to gait pattern. Gait velocity: decreased Gait velocity interpretation: <1.31 ft/sec, indicative of household ambulator     Posture / Balance Dynamic Sitting Balance Sitting balance - Comments: Cannot tolerate change. L lateral lean to off load hip Balance Overall balance assessment: Needs assistance Sitting-balance support: Single extremity supported, Feet supported Sitting balance-Leahy Scale: Fair Sitting balance - Comments: Cannot tolerate change. L lateral lean to off load hip Postural control: Left lateral lean Standing balance support: Bilateral upper extremity supported, During functional activity, Reliant on assistive device for balance Standing balance-Leahy Scale: Poor Standing balance comment: Dependent on RW and external support     Special considerations/life events  Limited DNR on acute Wife states ID planning 6 weeks IV antibiotics. Cefazolin  IV via PICC until 12/12/24) They have neighbor who is retired IV engineer, civil (consulting) for [employer] and wife also capable to administer.    Previous Home Environment  Living Arrangements: Spouse/significant other  Lives With: Spouse Available Help at Discharge: Family, Available 24 hours/day Type of Home: House Home Layout: One level Home Access: Stairs to enter Entrance Stairs-Rails: None Entrance Stairs-Number of Steps: 3 Bathroom Shower/Tub: Health Visitor: Handicapped height Bathroom Accessibility: Yes How Accessible:  Accessible via walker Home Care Services: No   Discharge Living Setting Plans for Discharge Living Setting: Patient's home, House, Lives with (comment) (wife) Type of Home at Discharge: House Discharge Home Layout: One level Discharge Home Access: Stairs to enter Entrance Stairs-Rails: None Entrance Stairs-Number of Steps: 3 Discharge Bathroom Shower/Tub: Walk-in shower Discharge Bathroom Toilet: Handicapped height Discharge Bathroom Accessibility: Yes How Accessible: Accessible via walker Does the patient have any problems obtaining your medications?: No   Social/Family/Support Systems Patient Roles: Spouse Contact Information: wife Anticipated Caregiver: wife Anticipated Caregiver's Contact Information: see contacts Ability/Limitations of Caregiver: no limitations Caregiver Availability: 24/7 Discharge Plan Discussed with Primary Caregiver: Yes Is Caregiver In Agreement with Plan?: Yes Does Caregiver/Family have Issues with Lodging/Transportation while Pt is in Rehab?: No   Goals Patient/Family Goal for Rehab: Mod I with PT and OT Expected length of stay: ELOS 10 to 14 days Pt/Family Agrees to Admission and willing to participate: Yes Program Orientation Provided & Reviewed with Pt/Caregiver Including Roles  & Responsibilities: Yes   Decrease burden of Care through IP rehab admission: n/a   Possible need for SNF placement upon discharge: not anticipated   Patient Condition: This patient's condition remains as documented in the consult dated 11/02/24, in which the Rehabilitation Physician determined and documented that the patient's condition is appropriate for intensive rehabilitative care in an inpatient rehabilitation facility. Will admit to inpatient rehab today.   Preadmission Screen Completed By:  Alison Heron Lot RN MSN, 11/04/2024 12:02 PM ______________________________________________________________________   Discussed status with Dr. Emeline on 11/14/24 at  1144 and received approval for admission today.   Admission Coordinator:  Alison Heron Lot, RN MSN time [id number] Date 11/04/24    Assessment/Plan: Diagnosis: Debility due to R hip septic arthritis s/p I&D 12/14 Does the need for close, 24 hr/day Medical supervision in concert with the patient's rehab needs make it unreasonable for this patient to be served in a less intensive setting? Yes Co-Morbidities requiring supervision/potential complications: MSSA bacteremia, hypertension, hyponatremia, OSA Due to bladder management, bowel management,  safety, skin/wound care, disease management, medication administration, pain management, and patient education, does the patient require 24 hr/day rehab nursing? Yes Does the patient require coordinated care of a physician, rehab nurse, PT, OT to address physical and functional deficits in the context of the above medical diagnosis(es)? Yes Addressing deficits in the following areas: balance, endurance, locomotion, strength, transferring, bathing, dressing, and toileting Can the patient actively participate in an intensive therapy program of at least 3 hrs of therapy 5 days a week? Yes The potential for patient to make measurable gains while on inpatient rehab is excellent Anticipated functional outcomes upon discharge from inpatient rehab: modified independent PT, modified independent OT Estimated rehab length of stay to reach the above functional goals is: 10-14 days Anticipated discharge destination: Home 10. Overall Rehab/Functional Prognosis: good     MD Signature:   Joesph JAYSON Likes, DO 11/04/2024              Revision History  Date/Time User Provider Type Action  11/04/2024 12:20 PM Likes Joesph JAYSON, DO Physician Sign  11/04/2024 12:02 PM Alison Heron MATSU, RN Rehab Admission Coordinator Share  11/04/2024 11:45 AM Alison Heron MATSU, RN Rehab Admission Coordinator Share  11/04/2024 11:19 AM Alison Heron MATSU, RN Rehab Admission  Coordinator Share  11/04/2024 11:16 AM Alison Heron MATSU, RN Rehab Admission Coordinator Share  11/04/2024 11:14 AM Alison Heron MATSU, RN Rehab Admission Coordinator Share  11/03/2024  8:53 AM Alison, Heron MATSU, RN Rehab Admission Coordinator Share   View Details Report

## 2024-11-04 NOTE — Progress Notes (Signed)
° °  Inpatient Rehabilitation Admissions Coordinator   I have insurance approval and CIR bed to admit to today. Acute team and TOC made aware. I met with patient and wife at bedside and they are in agreement I will make the arrangements.  Heron Leavell, RN, MSN Rehab Admissions Coordinator [phone] 11/04/2024 11:41 AM

## 2024-11-04 NOTE — Progress Notes (Signed)
 PHARMACY CONSULT NOTE FOR:  OUTPATIENT  PARENTERAL ANTIBIOTIC THERAPY (OPAT)  Indication: MSSA R-hip septic arthritis + MV IE Regimen: Cefazolin  2g IV every 8 hours End date: 12/12/24 (6 weeks from BCx clearance 12/14)  IV antibiotic discharge orders are pended. To discharging provider:  please sign these orders via discharge navigator,  Select New Orders & click on the button choice - Manage This Unsigned Work.     Thank you for allowing pharmacy to be a part of this patients care.  Almarie Lunger, PharmD, BCPS, BCIDP Infectious Diseases Clinical Pharmacist 11/04/2024 8:40 AM   **Pharmacist phone directory can now be found on [email or web address] ([email or web address]).  Listed under [facility].

## 2024-11-04 NOTE — Progress Notes (Signed)
 Inpatient Rehabilitation Admission Medication Review by a Pharmacist  A complete drug regimen review was completed for this patient to identify any potential clinically significant medication issues.  High Risk Drug Classes Is patient taking? Indication by Medication  Antipsychotic Yes, as an intravenous medication Compazine - N/V  Anticoagulant Yes Lovenox - vte ppx  Antibiotic Yes, as an intravenous medication Cefazolin - bacteremia Lamisil - onychomycosis  Opioid Yes OxyIR- acute pain  Antiplatelet No   Hypoglycemics/insulin No   Vasoactive Medication Yes Losartan /hydrochlorothiazide - HTN  Chemotherapy No   Other Yes Trazodone - sleep Benadryl - itching Gabapentin - neuropathic pain Robaxin - muscle spasms Zocor - HLD     Type of Medication Issue Identified Description of Issue Recommendation(s)  Drug Interaction(s) (clinically significant)     Duplicate Therapy     Allergy     No Medication Administration End Date     Incorrect Dose     Additional Drug Therapy Needed     Significant med changes from prior encounter (inform family/care partners about these prior to discharge).    Other       Clinically significant medication issues were identified that warrant physician communication and completion of prescribed/recommended actions by midnight of the next day:  No   Time spent performing this drug regimen review (minutes):  30    Jeffren Dombek BS, PharmD, BCPS Clinical Pharmacist 11/04/2024 2:33 PM  Contact: [phone] after 3 PM

## 2024-11-04 NOTE — Progress Notes (Signed)
° °  Brief Progress Note   _____________________________________________________________________________________________________________  Patient Name: Duane Greene Patient DOB: 10-29-1951 Date: 11/04/2024     Data: Patient discharging to CIR today. Patient needs PICC.    Action: Reached out to Vas/IV Team to see when PICC could be placed.    Response:  Spoke with Industrial/product Designer, several STAT orders in front of this patient. But PICC team can place line in CIR but will need new order once patient arrives to CIR. Updated CIR team [name], Dr Emeline and Dr Sonjia via secure chat.  _____________________________________________________________________________________________________________  [facility] RN Expeditor Matelyn Antonelli Please contact us  directly via secure chat (search for [facility]) or by calling us  at [phone] [facility]).

## 2024-11-04 NOTE — Discharge Summary (Incomplete)
 Physician Discharge Summary  Duane Greene FMW:[id number] DOB: December 17, 1950 DOA: 10/29/2024  PCP: Duane Greene LABOR, MD  Admit date: 10/29/2024 Discharge date: 11/04/2024  Admitted From: Home  Discharge disposition: CIR   Recommendations for Outpatient Follow-Up:   Follow up with orthopedics and ID as outpatient.  Continue IV antibiotics until 12/12/24   Discharge Diagnosis:   Principal Problem:   Septic arthritis [facility]) Active Problems:   Bacteremia   Discharge Condition: Improved.  Diet recommendation:.  Regular.  Wound care: None.  Code status:DNR   History of Present Illness:   Duane Greene is a 73 y.o. male with past medical history significant for hypertension hyperlipidemia, obstructive sleep apnea on CPAP, and OA bilateral hips s/p US  guided steroid injection on 10/19/24 presented to hospital with right hip joint swelling since injection with decreased mobility.  Patient then presented to [facility] ED on 12/ 9/25 where an x-ray was done which was consistent with osteoarthritis so was given medications to go home with.  Subsequently, patient also saw orthopedics in outpatient MRI was ordered.  Patient then went home but then despite taking medications for pain it was unbearable so presented to the ED.   In the ED, patient was febrile with temperature of 100.4 deg F x1. Labs with notable for WBC 12.9-->12.3, lactic acid 2-->1.1, and CRP 27.6.  Urinalysis showed are bacteruria and WBC 0-5. Blood cultures were sent from the ED was consistent with S. aureus. ED provider consulted orthopedic surgery, ID, and  patient was admitted to the hospital for further evaluation and treatment.  During hospitalization, patient was seen by orthopedics and underwent irrigation and debridement of the right hip for septic arthritis.  Subsequently underwent TEE with findings of mitral valve vegetation.  Plan for CIR at this time   Hospital Course:   Following conditions were  addressed during hospitalization as listed below,  Right hip septic arthritis MSSA bacteremia Infectious disease for the patient during hospitalization and currently on IV cefazolin .  Plan is to continue cefazolin  until 12/12/2024.  Orthopedics was consulted and patient underwent right hip aspiration on 10/30/2024.  Patient then underwent I&D of the hip joint and underwent irrigation and debridement of the right hip on 10/31/2024.    Intraoperative cultures showing gram-positive cocci.  ID recommended TTE which showed LV ejection fraction of 55 to 60% with no regional wall motion abnormality and no mention of vegetation.  Patient then underwent TEE on 11/04/2024 with vegetation on the mitral valve.  Blood culture from 10/31/2024 negative in 4 days.  Status post PICC line.  Will need to follow-up with orthopedics in ID as outpatient.    Essential hypertension Patient is on losartan -hydrochlorothiazide  50-12.5mg  daily at home.  Blood pressure stable at this time.   OSA Continue CPAP at nighttime.    Hyponatremia. Mild improved.  Latest sodium level of 139  Disposition.  At this time, patient is stable for disposition to CIR   Medical Consultants:   Infectious disease Orthopedics  Procedures:    Right hip aspiration 10/30/2024 Irrigation and debridement of the right hip on 10/31/2024 TEE 11/03/2024  Subjective:   Today, patient was seen and examined at bedside.  Patient states that he continues to feel better.  Denies any nausea vomiting.  Has mild sore throat from TEE yesterday otherwise okay.    Discharge Exam:   Vitals:   11/04/24 0223 11/04/24 0815  BP: 137/67 117/63  Pulse: 66 70  Resp: 14 16  Temp: 98.7 F (37.1 C)  98.8 F (37.1 C)  SpO2: 98% 97%   Vitals:   11/03/24 1512 11/03/24 1935 11/04/24 0223 11/04/24 0815  BP: (!) 114/53 (!) 114/52 137/67 117/63  Pulse: 73 75 66 70  Resp: 16 15 14 16   Temp: 98.1 F (36.7 C) 98.3 F (36.8 C) 98.7 F (37.1 C) 98.8 F (37.1  C)  TempSrc:      SpO2: 95% 94% 98% 97%  Weight:      Height:       Body mass index is 31.01 kg/m.  General:  Average built, not in obvious distress, alert awake and oriented, HENT:   No scleral pallor or icterus noted. Oral mucosa is moist.  Chest:  Clear breath sounds.  No crackles or wheezes.  CVS: S1 &S2 heard. No murmur.  Regular rate and rhythm. Abdomen: Soft, nontender, nondistended.  Bowel sounds are heard.   Extremities: No cyanosis, clubbing or edema.  Peripheral pulses are palpable.  Right hip with difficulty movement of the hip Psych: Alert, awake and oriented, normal mood CNS:  No cranial nerve deficits.  Moves all extremities Skin: Warm and dry.  No rashes noted.  The results of significant diagnostics from this hospitalization (including imaging, microbiology, ancillary and laboratory) are listed below for reference.     Diagnostic Studies:   DG FL ASP/INJ MAJOR (SHOULDER, HIP, KNEE) Addendum Date: 10/30/2024 ADDENDUM REPORT: 10/30/2024 12:37 ADDENDUM: CLINICAL DATA: 73 year old male with right hip pain, concerning for septic arthritis. IR requested for diagnostic right hip intra-articular aspiration. Electronically Signed   By: Ester Sides M.D.   On: 10/30/2024 12:37   Result Date: 10/30/2024 CLINICAL DATA:  73 year old male with right hip pain, concerning for septic arthritis. IR requested for diagnostic right hip intra-articular aspiration. EXAM: RIGHT HIP INJECTION AND ASPIRATION UNDER FLUOROSCOPY COMPARISON:  MR Hip Right w w/o cm on 10/29/2024. FLUOROSCOPY: Radiation Exposure Index (as provided by the fluoroscopic device): 7.0 mGy Kerma PROCEDURE: Overlying skin prepped with Betadine, draped in the usual sterile fashion, and infiltrated locally with buffered Lidocaine . Straight 20 gauge spinal needle advanced to the superolateral margin of the right femoral head, with immediate return of turbid, straw color fluid in needle hub, which was aspirated. Diagnostic  injection of iodinated contrast demonstrates intra-articular spread without intravascular component. Subsequent aspiration of fluid was attempted post contrast injection, with total of 1 mL of fluid aspirated. Needle was then removed.  No immediate complication. IMPRESSION: Technically successful right hip injection and aspiration under fluoroscopy. Procedure performed by: Carlin Griffon, PA-C. Electronically Signed: By: Ester Sides M.D. On: 10/30/2024 12:34   MR HIP RIGHT W WO CONTRAST Result Date: 10/30/2024 CLINICAL DATA:  Chronic right hip pain and groin pain EXAM: MRI OF THE RIGHT HIP WITHOUT AND WITH CONTRAST TECHNIQUE: Multiplanar, multisequence MR imaging was performed both before and after administration of intravenous contrast. CONTRAST:  9mL GADAVIST  GADOBUTROL  1 MMOL/ML IV SOLN COMPARISON:  Hip x-ray 10/26/2024 FINDINGS: Bones: No hip fracture, dislocation or avascular necrosis. No periosteal reaction or bone destruction. No aggressive osseous lesion. Normal sacrum and sacroiliac joints. No SI joint widening or erosive changes. Lower lumbar spine spondylosis. Articular cartilage and labrum Articular cartilage: High-grade partial is cartilage loss of the right anterosuperior femoral head and acetabulum. Labrum:  Nondisplaced right anterosuperior labral tear. Joint or bursal effusion Joint effusion: Large right hip joint effusion. No left hip joint effusion. No SI joint effusion. Bursae:  No bursal fluid. Muscles and tendons Flexors: Normal. Extensors: Normal. Abductors: Normal. Adductors: Normal. Gluteals: Severe  muscle edema in the right gluteus minimus muscle. Mild muscle edema in the abductor musculature and perifascial edema. Small fluid collection in the right gluteus minimus muscle measuring 5.9 x 0.9 x 3.5 cm. Hamstrings: Normal. Other findings No pelvic free fluid. No fluid collection or hematoma. No inguinal lymphadenopathy. No inguinal hernia. IMPRESSION: 1. Large right hip joint effusion  with severe muscle edema in the right gluteus minimus muscle and mild muscle edema in the abductor musculature and perifascial edema. Small abscess in the right gluteus minimus muscle measuring 5.9 x 0.9 x 3.5 cm. Findings most concerning for septic arthritis and myositis. 2. High-grade partial is cartilage loss of the right anterosuperior femoral head and acetabulum. 3. Nondisplaced right anterosuperior labral tear. Electronically Signed   By: Julaine Blanch M.D.   On: 10/30/2024 08:15   CT ABDOMEN PELVIS W CONTRAST Result Date: 10/29/2024 EXAM: CT ABDOMEN AND PELVIS WITH CONTRAST 10/29/2024 05:52:43 PM TECHNIQUE: CT of the abdomen and pelvis was performed with the administration of 75 mL of iohexol  (OMNIPAQUE ) 350 MG/ML injection. Multiplanar reformatted images are provided for review. Automated exposure control, iterative reconstruction, and/or weight-based adjustment of the mA/kV was utilized to reduce the radiation dose to as low as reasonably achievable. COMPARISON: 10/01/2019 CLINICAL HISTORY: Sepsis FINDINGS: LOWER CHEST: There is atelectasis in the lung bases. LIVER: The liver is unremarkable. GALLBLADDER AND BILE DUCTS: Gallbladder surgically absent. No biliary ductal dilatation. SPLEEN: No acute abnormality. PANCREAS: No acute abnormality. ADRENAL GLANDS: No acute abnormality. KIDNEYS, URETERS AND BLADDER: There is a punctate calculus in the inferior pole of the left kidney. There is mild nonspecific bilateral perinephric vascular stranding. There is some scarring in the lateral left kidney. No hydronephrosis. Urinary bladder is unremarkable. GI AND BOWEL: Stomach demonstrates no acute abnormality. There is diffuse colonic diverticulosis. The appendix appears normal. There is no bowel obstruction. PERITONEUM AND RETROPERITONEUM: No ascites. No free air. VASCULATURE: Aorta is normal in caliber. There are atherosclerotic calcifications of the aorta. LYMPH NODES: No lymphadenopathy. REPRODUCTIVE ORGANS: No  acute abnormality. BONES AND SOFT TISSUES: Degenerative changes affect the spine. There is no focal osseous lesion. There is a very small fat containing left inguinal hernia seen best on coronal view. IMPRESSION: 1. No acute findings in the abdomen or pelvis. 2. Cholecystectomy without complication. 3. Diffuse colonic diverticulosis without evidence of acute diverticulitis. 4. Aortic atherosclerosis without aneurysm. 5. Nonobstructing punctate left inferior pole renal calculus. 6. Bilateral mild perinephric stranding and left renal cortical scarring, likely chronic. Electronically signed by: Greig Pique MD 10/29/2024 06:52 PM EST RP Workstation: [id number]     Labs:   Basic Metabolic Panel: Recent Labs  Lab 10/29/24 1225 10/30/24 1000 10/31/24 0013 11/03/24 0508  NA 132* 134* 135 139  K 3.5 3.6 3.5 4.0  CL 93* 100 99 103  CO2 27 26 25 28   GLUCOSE 114* 165* 184* 105*  BUN 20 22 25* 23  CREATININE 1.01 0.88  0.87 0.88 0.66  CALCIUM 8.3* 7.9* 8.3* 8.5*   GFR Estimated Creatinine Clearance: 86.6 mL/min (by C-G formula based on SCr of 0.66 mg/dL). Liver Function Tests: Recent Labs  Lab 10/29/24 1225 10/30/24 1000  AST 48* 51*  ALT 55* 50*  ALKPHOS 98 103  BILITOT 1.8* 1.2  PROT 7.1 5.8*  ALBUMIN 3.1* 2.5*   No results for input(s): LIPASE, AMYLASE in the last 168 hours. No results for input(s): AMMONIA in the last 168 hours. Coagulation profile Recent Labs  Lab 10/29/24 1709  INR 1.2  CBC: Recent Labs  Lab 10/29/24 1225 10/30/24 1000 10/31/24 0013 11/01/24 0555 11/03/24 0508  WBC 12.9* 12.3* 14.4* 14.9* 10.0  NEUTROABS 10.0* 9.6*  --  13.1*  --   HGB 15.3 14.4 13.8 13.2 13.1  HCT 44.4 40.5 38.9* 37.8* 37.8*  MCV 94.3 92.9 91.5 92.4 94.7  PLT 124* 106* 133* 165 248   Cardiac Enzymes: Recent Labs  Lab 10/30/24 1000  CKTOTAL 148   BNP: Invalid input(s): POCBNP CBG: Recent Labs  Lab 11/02/24 0559  GLUCAP 149*   D-Dimer No results for  input(s): DDIMER in the last 72 hours. Hgb A1c No results for input(s): HGBA1C in the last 72 hours. Lipid Profile No results for input(s): CHOL, HDL, LDLCALC, TRIG, CHOLHDL, LDLDIRECT in the last 72 hours. Thyroid  function studies No results for input(s): TSH, T4TOTAL, T3FREE, THYROIDAB in the last 72 hours.  Invalid input(s): FREET3 Anemia work up No results for input(s): VITAMINB12, FOLATE, FERRITIN, TIBC, IRON, RETICCTPCT in the last 72 hours. Microbiology Recent Results (from the past 240 hours)  Blood Culture (routine x 2)     Status: Abnormal   Collection Time: 10/29/24  5:09 PM   Specimen: BLOOD LEFT ARM  Result Value Ref Range Status   Specimen Description BLOOD LEFT ARM  Final   Special Requests   Final    BOTTLES DRAWN AEROBIC AND ANAEROBIC Blood Culture adequate volume   Culture  Setup Time   Final    GRAM POSITIVE COCCI IN BOTH AEROBIC AND ANAEROBIC BOTTLES CRITICAL RESULT CALLED TO, READ BACK BY AND VERIFIED WITH: PHARMD EMERSON GLATTER 1212 AT 0955, ADC Performed at [facility], [address] [place], [place] [address]    Culture STAPHYLOCOCCUS AUREUS (A)  Final   Report Status 11/01/2024 FINAL  Final   Organism ID, Bacteria STAPHYLOCOCCUS AUREUS  Final      Susceptibility   Staphylococcus aureus - MIC*    CIPROFLOXACIN <=0.5 SENSITIVE Sensitive     ERYTHROMYCIN <=0.25 SENSITIVE Sensitive     GENTAMICIN <=0.5 SENSITIVE Sensitive     OXACILLIN 0.5 SENSITIVE Sensitive     TETRACYCLINE <=1 SENSITIVE Sensitive     VANCOMYCIN  1 SENSITIVE Sensitive     TRIMETH/SULFA <=10 SENSITIVE Sensitive     CLINDAMYCIN <=0.25 SENSITIVE Sensitive     RIFAMPIN <=0.5 SENSITIVE Sensitive     Inducible Clindamycin NEGATIVE Sensitive     LINEZOLID 2 SENSITIVE Sensitive     * STAPHYLOCOCCUS AUREUS  Blood Culture ID Panel (Reflexed)     Status: Abnormal   Collection Time: 10/29/24  5:09 PM  Result Value Ref Range Status   Enterococcus  faecalis NOT DETECTED NOT DETECTED Final   Enterococcus Faecium NOT DETECTED NOT DETECTED Final   Listeria monocytogenes NOT DETECTED NOT DETECTED Final   Staphylococcus species DETECTED (A) NOT DETECTED Final    Comment: CRITICAL RESULT CALLED TO, READ BACK BY AND VERIFIED WITH: PHARMD M. MITCHELL 1212 AT 0955, ADC    Staphylococcus aureus (BCID) DETECTED (A) NOT DETECTED Final    Comment: CRITICAL RESULT CALLED TO, READ BACK BY AND VERIFIED WITH: PHARMD M. MITCHELL 1212 AT 0955, ADC    Staphylococcus epidermidis NOT DETECTED NOT DETECTED Final   Staphylococcus lugdunensis NOT DETECTED NOT DETECTED Final   Streptococcus species NOT DETECTED NOT DETECTED Final   Streptococcus agalactiae NOT DETECTED NOT DETECTED Final   Streptococcus pneumoniae NOT DETECTED NOT DETECTED Final   Streptococcus pyogenes NOT DETECTED NOT DETECTED Final   A.calcoaceticus-baumannii NOT DETECTED NOT DETECTED  Final   Bacteroides fragilis NOT DETECTED NOT DETECTED Final   Enterobacterales NOT DETECTED NOT DETECTED Final   Enterobacter cloacae complex NOT DETECTED NOT DETECTED Final   Escherichia coli NOT DETECTED NOT DETECTED Final   Klebsiella aerogenes NOT DETECTED NOT DETECTED Final   Klebsiella oxytoca NOT DETECTED NOT DETECTED Final   Klebsiella pneumoniae NOT DETECTED NOT DETECTED Final   Proteus species NOT DETECTED NOT DETECTED Final   Salmonella species NOT DETECTED NOT DETECTED Final   Serratia marcescens NOT DETECTED NOT DETECTED Final   Haemophilus influenzae NOT DETECTED NOT DETECTED Final   Neisseria meningitidis NOT DETECTED NOT DETECTED Final   Pseudomonas aeruginosa NOT DETECTED NOT DETECTED Final   Stenotrophomonas maltophilia NOT DETECTED NOT DETECTED Final   Candida albicans NOT DETECTED NOT DETECTED Final   Candida auris NOT DETECTED NOT DETECTED Final   Candida glabrata NOT DETECTED NOT DETECTED Final   Candida krusei NOT DETECTED NOT DETECTED Final   Candida parapsilosis NOT DETECTED  NOT DETECTED Final   Candida tropicalis NOT DETECTED NOT DETECTED Final   Cryptococcus neoformans/gattii NOT DETECTED NOT DETECTED Final   Meth resistant mecA/C and MREJ NOT DETECTED NOT DETECTED Final    Comment: Performed at [facility], [address], [place], [place] [address]  Blood Culture (routine x 2)     Status: Abnormal   Collection Time: 10/29/24  5:14 PM   Specimen: BLOOD RIGHT ARM  Result Value Ref Range Status   Specimen Description BLOOD RIGHT ARM  Final   Special Requests   Final    BOTTLES DRAWN AEROBIC AND ANAEROBIC Blood Culture adequate volume   Culture  Setup Time   Final    GRAM POSITIVE COCCI IN BOTH AEROBIC AND ANAEROBIC BOTTLES CRITICAL VALUE NOTED.  VALUE IS CONSISTENT WITH PREVIOUSLY REPORTED AND CALLED VALUE.    Culture (A)  Final    STAPHYLOCOCCUS AUREUS SUSCEPTIBILITIES PERFORMED ON PREVIOUS CULTURE WITHIN THE LAST 5 DAYS. Performed at [facility], [address], [place], [place] [address]    Report Status 11/01/2024 FINAL  Final  Resp panel by RT-PCR (RSV, Flu A&B, Covid) Anterior Nasal Swab     Status: None   Collection Time: 10/29/24  5:33 PM   Specimen: Anterior Nasal Swab  Result Value Ref Range Status   SARS Coronavirus 2 by RT PCR NEGATIVE NEGATIVE Final   Influenza A by PCR NEGATIVE NEGATIVE Final   Influenza B by PCR NEGATIVE NEGATIVE Final    Comment: (NOTE) The Xpert Xpress SARS-CoV-2/FLU/RSV plus assay is intended as an aid in the diagnosis of influenza from Nasopharyngeal swab specimens and should not be used as a sole basis for treatment. Nasal washings and aspirates are unacceptable for Xpert Xpress SARS-CoV-2/FLU/RSV testing.  Fact Sheet for Patients: bloggercourse.com  Fact Sheet for Healthcare Providers: seriousbroker.it  This test is not yet approved or cleared by the [place]  FDA and has been authorized for detection and/or diagnosis of SARS-CoV-2 by FDA  under an Emergency Use Authorization (EUA). This EUA will remain in effect (meaning this test can be used) for the duration of the COVID-19 declaration under Section 564(b)(1) of the Act, 21 U.S.C. section 360bbb-3(b)(1), unless the authorization is terminated or revoked.     Resp Syncytial Virus by PCR NEGATIVE NEGATIVE Final    Comment: (NOTE) Fact Sheet for Patients: bloggercourse.com  Fact Sheet for Healthcare Providers: seriousbroker.it  This test is not yet approved or cleared by the [place]  FDA and has been  authorized for detection and/or diagnosis of SARS-CoV-2 by FDA under an Emergency Use Authorization (EUA). This EUA will remain in effect (meaning this test can be used) for the duration of the COVID-19 declaration under Section 564(b)(1) of the Act, 21 U.S.C. section 360bbb-3(b)(1), unless the authorization is terminated or revoked.  Performed at [facility], [address], [place], [place] [address]   Urine Culture     Status: None   Collection Time: 10/30/24  7:49 AM   Specimen: Urine, Clean Catch  Result Value Ref Range Status   Specimen Description URINE, CLEAN CATCH  Final   Special Requests NONE  Final   Culture   Final    NO GROWTH Performed at [facility], [address] [place], [place] [address]    Report Status 10/31/2024 FINAL  Final  Culture, blood (Routine X 2) w Reflex to ID Panel     Status: None (Preliminary result)   Collection Time: 10/31/24 12:15 AM   Specimen: BLOOD RIGHT HAND  Result Value Ref Range Status   Specimen Description BLOOD RIGHT HAND  Final   Special Requests   Final    BOTTLES DRAWN AEROBIC AND ANAEROBIC Blood Culture adequate volume   Culture   Final    NO GROWTH 4 DAYS Performed at [facility], [address], [place], [place] [address]    Report Status PENDING  Incomplete  Culture, blood (Routine X 2) w Reflex to ID Panel     Status: None  (Preliminary result)   Collection Time: 10/31/24 12:26 AM   Specimen: BLOOD RIGHT ARM  Result Value Ref Range Status   Specimen Description BLOOD RIGHT ARM  Final   Special Requests   Final    BOTTLES DRAWN AEROBIC AND ANAEROBIC Blood Culture adequate volume   Culture   Final    NO GROWTH 4 DAYS Performed at [facility], [address] [place], [place] [address]    Report Status PENDING  Incomplete  Aerobic/Anaerobic Culture w Gram Stain (surgical/deep wound)     Status: None (Preliminary result)   Collection Time: 10/31/24  5:45 PM   Specimen: Synovial, Right Hip; Body Fluid  Result Value Ref Range Status   Specimen Description SYNOVIAL  Final   Special Requests RIGHT HIP  Final   Gram Stain   Final    MODERATE GRAM POSITIVE COCCI RARE GRAM POSITIVE COCCI Performed at [facility], [address] [place], [place] [address]    Culture   Final    RARE STAPHYLOCOCCUS AUREUS NO ANAEROBES ISOLATED; CULTURE IN PROGRESS FOR 5 DAYS    Report Status PENDING  Incomplete   Organism ID, Bacteria STAPHYLOCOCCUS AUREUS  Final      Susceptibility   Staphylococcus aureus - MIC*    CIPROFLOXACIN <=0.5 SENSITIVE Sensitive     ERYTHROMYCIN <=0.25 SENSITIVE Sensitive     GENTAMICIN <=0.5 SENSITIVE Sensitive     OXACILLIN 0.5 SENSITIVE Sensitive     TETRACYCLINE <=1 SENSITIVE Sensitive     VANCOMYCIN  1 SENSITIVE Sensitive     TRIMETH/SULFA <=10 SENSITIVE Sensitive     CLINDAMYCIN <=0.25 SENSITIVE Sensitive     RIFAMPIN <=0.5 SENSITIVE Sensitive     Inducible Clindamycin NEGATIVE Sensitive     LINEZOLID 2 SENSITIVE Sensitive     * RARE STAPHYLOCOCCUS AUREUS     Discharge Instructions:   Discharge Instructions     [facility] pharmacist to adjust dose for Vancomycin , Aminoglycosides and  other anti-infective therapies as requested by physician.   Complete by: As directed    [facility] to provide Cath Flo 2mg    Complete by: As directed    Administer  for PICC line occlusion and as ordered by physician for other access device issues.   Anaphylaxis Kit: Provided to treat any anaphylactic reaction to the medication being provided to the patient if First Dose or when requested by physician   Complete by: As directed    Epinephrine  1mg /ml vial / amp: Administer 0.3mg  (0.73ml) subcutaneously once for moderate to severe anaphylaxis, nurse to call physician and pharmacy when reaction occurs and call 911 if needed for immediate care   Diphenhydramine  50mg /ml IV vial: Administer 25-50mg  IV/IM PRN for first dose reaction, rash, itching, mild reaction, nurse to call physician and pharmacy when reaction occurs   Sodium Chloride  0.9% NS 500ml IV: Administer if needed for hypovolemic blood pressure drop or as ordered by physician after call to physician with anaphylactic reaction   Change dressing on IV access line weekly and PRN   Complete by: As directed    Diet general   Complete by: As directed    Discharge instructions   Complete by: As directed    Follow-up with infectious disease regarding antibiotics and orthopedics for right hip follow-up.  Continue IV cefazolin  until 12/12/2024 through PICC line.   Flush IV access with Sodium Chloride  0.9% and Heparin  10 units/ml or 100 units/ml   Complete by: As directed    Home infusion instructions - [facility]   Complete by: As directed    Instructions: Flush IV access with Sodium Chloride  0.9% and Heparin  10units/ml or 100units/ml   Change dressing on IV access line: Weekly and PRN   Instructions Cath Flo 2mg : Administer for PICC Line occlusion and as ordered by physician for other access device   [facility] pharmacist to adjust dose for: Vancomycin , Aminoglycosides and other anti-infective therapies as requested by physician   Increase activity slowly   Complete by: As directed    Method of administration may be changed at the discretion of home infusion pharmacist based upon  assessment of the patient and/or caregivers ability to self-administer the medication ordered   Complete by: As directed    No wound care   Complete by: As directed       Allergies as of 11/04/2024   No Known Allergies      Medication List     TAKE these medications    acetaminophen  500 MG tablet Commonly known as: TYLENOL  Take 2 tablets (1,000 mg total) by mouth 3 (three) times daily.   aspirin  EC 81 MG tablet Take 81 mg by mouth at bedtime. Swallow whole.   ceFAZolin  IVPB Commonly known as: ANCEF  Inject 2 g into the vein every 8 (eight) hours. 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   cholecalciferol  25 MCG (1000 UNIT) tablet Commonly known as: VITAMIN D3 Take 1,000 Units by mouth daily.   enoxaparin  40 MG/0.4ML injection Commonly known as: LOVENOX  Inject 0.4 mLs (40 mg total) into the skin daily. Start taking on: November 05, 2024   gabapentin  300 MG capsule  Commonly known as: NEURONTIN  Take 1 capsule (300 mg total) by mouth 3 (three) times daily. What changed: when to take this   lidocaine  5 % Commonly known as: LIDODERM  Place 1 patch onto the skin daily. Remove & Discard patch within 12 hours or as directed by MD   losartan -hydrochlorothiazide  50-12.5 MG tablet Commonly known as: HYZAAR  Take 1 tablet by mouth daily.   methocarbamol  500 MG tablet Commonly known as: ROBAXIN  Take 1 tablet (500 mg total) by mouth 3 (three) times daily.   multivitamin with minerals tablet Take 1 tablet by mouth at bedtime.   oxyCODONE  5 MG immediate release tablet Commonly known as: Oxy IR/ROXICODONE  Take 1 tablet (5 mg  total) by mouth every 6 (six) hours as needed for pain for 7 days.   polyethylene glycol 17 g packet Commonly known as: MIRALAX  / GLYCOLAX  Take 17 g by mouth 2 (two) times daily.   senna-docusate 8.6-50 MG tablet Commonly known as: Senokot-S Take 2 tablets by mouth 2 (two) times daily.   simvastatin  40 MG tablet Commonly known as: ZOCOR  Take 1 tablet (40 mg total) by mouth every evening. What changed: when to take this   terbinafine  250 MG tablet Commonly known as: LAMISIL  Take 1 tablet (250 mg total) by mouth every 7 (seven) days to prevent onychomycosis. What changed: when to take this   triamcinolone  cream 0.1 % Commonly known as: KENALOG  Apply 1 Application topically daily as needed.               Discharge Care Instructions  (From admission, onward)           Start     Ordered   11/04/24 0000  Change dressing on IV access line weekly and PRN  (Home infusion instructions - [facility] )        11/04/24 1120              Time coordinating discharge: 39 minutes  Signed:  Daena Alper  [facility] 11/04/2024, 11:29 AM

## 2024-11-04 NOTE — H&P (Shared)
 Physical Medicine and Rehabilitation Admission H&P    Functional deficits due to impaired mobility  HPI: Duane Greene is a 73 year old male with PMHx of HTN, HLD, OSA on CPAP, pulmonary embolism no longer on anticoagulation, and OA bilateral hips followed by [facility] .  Patient presented to [facility] ED on 10/26/2024 with complaints of right hip and groin pain. He was s/p US  guided steroid injection 12/2.  In the ED xray consistent for OA, he was discharged after receiving IV Toradol  and instructed to use p.o. oxycodone  and to follow-up with Ortho. Dr. Delane evaluated with MRI for possible avascular necrosis.  At home he became febrile his pain was unbearable.  Patient returned to the ED on 12/13 with temp 100.4 and labs notable for WBC 12.9, lactic acid 2, and CRP 27.6.  UA with rare bacteria and WBC 0-5.  Blood cultures were obtained and showed S. Aureus.  Orthopedic surgery and ID consulted for admission and placed on IV cefazolin .   Patient underwent right hip aspiration on 12/13 in IR and subsequently underwent I&D of the hip joint on 12/14.  Intraoperative cultures showed gram-positive cocci.  ID recommended TTE that showed LVEF of 55 to 60% with no regional wall motion abnormality and no mention of vegetation.  Cardiology consulted, patient then underwent TEE on 11/04/2024 with vegetation on the mitral valve.  Blood culture from 10/31/2024 have been negative for 4 days. ID continues to follow for course of treatment.   Prior to arrival he was active and independent, driving in the community with use of DME. He lives with his spouse in a once level home with 3 stairs to enter. He currently requires moderate assistance, +2 for physical assistance, +2 for safety/equipment with ADLs  and bed mobility. Min assist to transfer with RW.  Therapy evaluations completed due to patient decreased functional mobility was admitted for a comprehensive rehab  program.        ROS      Past Medical History:  Diagnosis Date   Arthritis    History of kidney stones    Hyperlipidemia    Hypertension    Right inguinal hernia    Sleep apnea    uses CPAP nightly   Past Surgical History:  Procedure Laterality Date   CHOLECYSTECTOMY N/A 06/13/2020   Procedure: LAPAROSCOPIC CHOLECYSTECTOMY;  Surgeon: Belinda Cough, MD;  Location: [facility] OR;  Service: General;  Laterality: N/A;   COLONOSCOPY     EXTRACORPOREAL SHOCK WAVE LITHOTRIPSY Left 10/18/2019   Procedure: EXTRACORPOREAL SHOCK WAVE LITHOTRIPSY (ESWL);  Surgeon: Renda Glance, MD;  Location: [facility] ORS;  Service: Urology;  Laterality: Left;   INCISION AND DRAINAGE OF WOUND Right 10/31/2024   Procedure: IRRIGATION AND DEBRIDEMENT WOUND;  Surgeon: Georgina Ozell LABOR, MD;  Location: [facility] OR;  Service: Orthopedics;  Laterality: Right;   INGUINAL HERNIA REPAIR Right 06/30/2019   Procedure: RIGHT INGUINAL HERNIA REPAIR WITH MESH;  Surgeon: Belinda Cough, MD;  Location: [facility];  Service: General;  Laterality: Right;   INTRAOPERATIVE CHOLANGIOGRAM N/A 06/13/2020   Procedure: INTRAOPERATIVE CHOLANGIOGRAM;  Surgeon: Belinda Cough, MD;  Location: [facility] OR;  Service: General;  Laterality: N/A;   KNEE ARTHROSCOPY Left    LUMBAR LAMINECTOMY/DECOMPRESSION MICRODISCECTOMY N/A 08/28/2020   Procedure: Laminectomy - Lumbar One-Two, Lumbar Two-Three, Lumbar Three-Four, Lumbar Four-Five, Lumbar Five-Sacral One;  Surgeon: Joshua Alm RAMAN, MD;  Location: [facility] OR;  Service: Neurosurgery;  Laterality: N/A;  posterior   REVERSE SHOULDER ARTHROPLASTY  Right 09/06/2022   Procedure: REVERSE Right SHOULDER ARTHROPLASTY;  Surgeon: Kay Kemps, MD;  Location: [facility];  Service: Orthopedics;  Laterality: Right;  120 hours choice and interscalene block   SHOULDER ARTHROSCOPY Bilateral    TONSILLECTOMY     TRANSESOPHAGEAL ECHOCARDIOGRAM (CATH LAB) N/A 11/03/2024   Procedure: TRANSESOPHAGEAL ECHOCARDIOGRAM;  Surgeon:  Sheena Pugh, DO;  Location: [facility];  Service: Cardiovascular;  Laterality: N/A;   Family History  Problem Relation Age of Onset   Heart Problems Father    CVA Father    Social History:  reports that he has never smoked. He has never used smokeless tobacco. He reports that he does not drink alcohol and does not use drugs. Allergies: Allergies[1] Medications Prior to Admission  Medication Sig Dispense Refill   aspirin  EC 81 MG tablet Take 81 mg by mouth at bedtime. Swallow whole.     cholecalciferol  (VITAMIN D3) 25 MCG (1000 UNIT) tablet Take 1,000 Units by mouth daily.     losartan -hydrochlorothiazide  (HYZAAR ) 50-12.5 MG tablet Take 1 tablet by mouth daily. 100 tablet 3   Multiple Vitamins-Minerals (MULTIVITAMIN WITH MINERALS) tablet Take 1 tablet by mouth at bedtime.     oxyCODONE  (OXY IR/ROXICODONE ) 5 MG immediate release tablet Take 1 tablet (5 mg total) by mouth every 6 (six) hours as needed for pain for 7 days. 28 tablet 0   simvastatin  (ZOCOR ) 40 MG tablet Take 1 tablet (40 mg total) by mouth every evening. (Patient taking differently: Take 40 mg by mouth at bedtime.) 100 tablet 3   terbinafine  (LAMISIL ) 250 MG tablet Take 1 tablet (250 mg total) by mouth every 7 (seven) days to prevent onychomycosis. (Patient taking differently: Take 250 mg by mouth every Monday.) 12 tablet 2   triamcinolone  cream (KENALOG ) 0.1 % Apply 1 Application topically daily as needed. 15 g 3   [DISCONTINUED] gabapentin  (NEURONTIN ) 300 MG capsule Take 1 capsule (300 mg total) by mouth at bedtime. 90 capsule 3      Home: Home Living Family/patient expects to be discharged to:: Private residence Living Arrangements: Spouse/significant other Available Help at Discharge: Family, Available 24 hours/day Type of Home: House Home Access: Stairs to enter Secretary/administrator of Steps: 3 Entrance Stairs-Rails: None Home Layout: One level Bathroom Shower/Tub: Health Visitor:  Handicapped height Bathroom Accessibility: Yes Home Equipment: Agricultural Consultant (2 wheels), [employer] - single point, Information systems manager - built in, Higher Education Careers Adviser held shower head, BSC/3in1  Lives With: Spouse   Functional History: Prior Function Prior Level of Function : Independent/Modified Independent, Driving Mobility Comments: Prior to last week, pt was ind with no AD however since then has been barely mobile with RW due to hip pain. ADLs Comments: Ind prior to last week  Functional Status:  Mobility: Bed Mobility Overal bed mobility: Needs Assistance Bed Mobility: Supine to Sit Rolling: Mod assist, Max assist Sidelying to sit: Max assist Supine to sit: Min assist Sit to supine: Mod assist General bed mobility comments: RLE management, significantly increased time, cues for use of bed rail and sequencing Transfers Overall transfer level: Needs assistance Equipment used: Rolling walker (2 wheels), Ambulation equipment used Transfers: Sit to/from Stand, Bed to chair/wheelchair/BSC Sit to Stand: From elevated surface, Min assist Bed to/from chair/wheelchair/BSC transfer type:: Step pivot Step pivot transfers: Min assist, +2 physical assistance, +2 safety/equipment General transfer comment: Rocking forward to gain momentum, cues for hand placement, minA to power up to standing Ambulation/Gait Ambulation/Gait assistance: Min assist, +2 safety/equipment Gait Distance (Feet): 15  Feet (15, 15) Assistive device: Rolling walker (2 wheels) Gait Pattern/deviations: Decreased stride length, Step-to pattern, Trunk flexed, Decreased weight shift to left, Decreased dorsiflexion - left, Decreased stance time - left General Gait Details: Verbal cues for sequencing/technique, upright posture. Pt with increased R foot external rotation (pt spouse states is baseline). Pt with step to gait pattern. Gait velocity: decreased Gait velocity interpretation: <1.31 ft/sec, indicative of household ambulator     ADL: ADL Overall ADL's : Needs assistance/impaired Eating/Feeding: Set up, Sitting Grooming: Wash/dry face, Set up, Sitting Upper Body Bathing: Minimal assistance, Sitting Lower Body Bathing: Maximal assistance Upper Body Dressing : Set up, Sitting Lower Body Dressing: Maximal assistance Lower Body Dressing Details (indicate cue type and reason): Sit EOB Toilet Transfer: Moderate assistance, +2 for physical assistance, +2 for safety/equipment, BSC/3in1, Rolling walker (2 wheels) Toileting- Clothing Manipulation and Hygiene: Total assistance, Sit to/from stand, Bed level Toileting - Clothing Manipulation Details (indicate cue type and reason): Pt soiled in bed with BM. Completed posterior peri care bed level with Pt rolling to L and R sides. Pt with additional active BM at beginning of supine to sit transfer. Posterior completed for 2nd BM with Pt in standing.  Cognition: Cognition Orientation Level: Oriented X4 Cognition Arousal: Alert Behavior During Therapy: WFL for tasks assessed/performed  Physical Exam: Blood pressure 117/63, pulse 70, temperature 98.8 F (37.1 C), resp. rate 16, height 5' 7 (1.702 m), weight 89.8 kg, SpO2 97%. Physical Exam  Results for orders placed or performed during the hospital encounter of 10/29/24 (from the past 48 hours)  CBC     Status: Abnormal   Collection Time: 11/03/24  5:08 AM  Result Value Ref Range   WBC 10.0 4.0 - 10.5 K/uL   RBC 3.99 (L) 4.22 - 5.81 MIL/uL   Hemoglobin 13.1 13.0 - 17.0 g/dL   HCT 62.1 (L) 60.9 - 47.9 %   MCV 94.7 80.0 - 100.0 fL   MCH 32.8 26.0 - 34.0 pg   MCHC 34.7 30.0 - 36.0 g/dL   RDW 86.3 88.4 - 84.4 %   Platelets 248 150 - 400 K/uL   nRBC 0.0 0.0 - 0.2 %    Comment: Performed at [facility], [address], [place], [place] [address]  Basic metabolic panel with GFR     Status: Abnormal   Collection Time: 11/03/24  5:08 AM  Result Value Ref Range   Sodium 139 135 - 145 mmol/L   Potassium 4.0 3.5 -  5.1 mmol/L   Chloride 103 98 - 111 mmol/L   CO2 28 22 - 32 mmol/L   Glucose, Bld 105 (H) 70 - 99 mg/dL    Comment: Glucose reference range applies only to samples taken after fasting for at least 8 hours.   BUN 23 8 - 23 mg/dL   Creatinine, Ser 9.33 0.61 - 1.24 mg/dL   Calcium 8.5 (L) 8.9 - 10.3 mg/dL   GFR, Estimated >39 >39 mL/min    Comment: (NOTE) Calculated using the CKD-EPI Creatinine Equation (2021)    Anion gap 8 5 - 15    Comment: Performed at [facility], [address] [place], [place] [address]   US  EKG SITE RITE Result Date: 11/04/2024 If Site Rite image not attached, placement could not be confirmed due to current cardiac rhythm.  ECHO TEE Result Date: 11/03/2024    TRANSESOPHOGEAL ECHO REPORT   Patient Name:   Duane Greene Date of Exam: 11/03/2024 Medical Rec #:  [id number]      Height:       67.0 in Accession #:    [id number]     Weight:       198.0 lb Date of Birth:  [birth year]      BSA:          2.014 m Patient Age:    73 years       BP:           124/55 mmHg Patient Gender: M              HR:           77 bpm. Exam Location:  Inpatient Procedure: Transesophageal Echo, 3D Echo, Color Doppler and Cardiac Doppler            (Both Spectral and Color Flow Doppler were utilized during            procedure). Indications:     Endocarditis  History:         Patient has prior history of Echocardiogram examinations, most                  recent 11/01/2024. Risk Factors:Hypertension, Dyslipidemia and                  Sleep Apnea.  Sonographer:     Koleen Popper RDCS Referring Phys:  [id number] [facility] Diagnosing Phys: Kardie Tobb DO PROCEDURE: After discussion of the risks and benefits of a TEE, an informed consent was obtained from the patient. The transesophogeal probe was passed without difficulty through the esophogus of the patient. Imaged were obtained with the patient in a left lateral decubitus position. Sedation performed by different physician. The patient was  monitored while under deep sedation. Anesthestetic sedation was provided intravenously by Anesthesiology: 111mg  of Propofol . Image quality was good. The patient's  vital signs; including heart rate, blood pressure, and oxygen saturation; remained stable throughout the procedure. The patient developed no complications during the procedure.  IMPRESSIONS  1. Left ventricular ejection fraction, by estimation, is 55 to 60%. The left ventricle has normal function.  2. Right ventricular systolic function is normal. The right ventricular size is normal.  3. No left atrial/left atrial appendage thrombus was detected. The LAA emptying velocity was 47 cm/s.  4. Very small echodense mobile mass on the ventricular side of the anterior mitral leaflet. The mitral valve is abnormal. Mild mitral valve regurgitation. Mild mitral stenosis.  5. The aortic valve is tricuspid. Aortic valve regurgitation is trivial. No aortic stenosis is present.  6. 3D performed of the mitral valve. Conclusion(s)/Recommendation(s): Findings are concerning for vegetation/infective endocarditis as detailed above. Findings concerning for mitral valve vegetation. FINDINGS  Left Ventricle: Left ventricular ejection fraction, by estimation, is 55 to 60%. The left ventricle has normal function. The left ventricular internal cavity size was normal in size. Right Ventricle: The right ventricular size is normal. Right ventricular systolic function is normal. Left Atrium: Left atrial size was not well visualized. No left atrial/left atrial appendage thrombus was detected. The LAA emptying velocity was 47 cm/s. Right Atrium: Right atrial size was not well visualized. Pericardium: There is no evidence of pericardial effusion. Presence of epicardial fat layer. Mitral Valve: Very small echodense mobile mass on the ventricular side of the anterior mitral leaflet. The mitral valve is abnormal. Mild mitral valve regurgitation. Mild mitral valve stenosis. Tricuspid Valve:  The tricuspid valve is normal in structure. Tricuspid valve regurgitation is not demonstrated. No evidence of tricuspid  stenosis. Aortic Valve: The aortic valve is tricuspid. Aortic valve regurgitation is trivial. No aortic stenosis is present. Pulmonic Valve: The pulmonic valve was not well visualized. Pulmonic valve regurgitation is trivial. No evidence of pulmonic stenosis. Aorta: The aortic root and ascending aorta are structurally normal, with no evidence of dilitation. There is minimal (Grade I) layered plaque. Venous: The left upper pulmonary vein, left lower pulmonary vein, right upper pulmonary vein and right lower pulmonary vein are normal. A normal flow pattern is recorded from the right upper pulmonary vein. IAS/Shunts: No atrial level shunt detected by color flow Doppler. Additional Comments: Spectral Doppler performed.  AORTA Ao Asc diam: 3.20 cm Dub Tobb DO Electronically signed by Dub Huntsman DO Signature Date/Time: 11/03/2024/3:20:41 PM    Final    EP STUDY Result Date: 11/03/2024 See surgical note for result.  CT HEAD WO CONTRAST ( ) Result Date: 11/02/2024 EXAM: CT HEAD WITHOUT CONTRAST 11/02/2024 03:36:33 PM TECHNIQUE: CT of the head was performed without the administration of intravenous contrast. Automated exposure control, iterative reconstruction, and/or weight based adjustment of the mA/kV was utilized to reduce the radiation dose to as low as reasonably achievable. COMPARISON: None available. CLINICAL HISTORY: Waxing and waning confusion FINDINGS: BRAIN AND VENTRICLES: No acute hemorrhage. No evidence of acute infarct. No hydrocephalus. No extra-axial collection. No mass effect or midline shift. Periventricular and subcortical white matter hypoattenuation, consistent with moderate chronic ischemic microvascular disease. Calcified atherosclerotic plaque within cavernous/supraclinoid internal carotid arteries. ORBITS: No acute abnormality. SINUSES: No acute abnormality. SOFT  TISSUES AND SKULL: No acute soft tissue abnormality. No skull fracture. IMPRESSION: 1. No acute intracranial abnormality. Electronically signed by: Donnice Mania MD 11/02/2024 09:20 PM EST RP Workstation: [id number]      Blood pressure 117/63, pulse 70, temperature 98.8 F (37.1 C), resp. rate 16, height 5' 7 (1.702 m), weight 89.8 kg, SpO2 97%.  Medical Problem List and Plan: 1. Functional deficits secondary to ***  -patient may *** shower  -ELOS/Goals: ***  2.  Antithrombotics: -DVT/anticoagulation:  Mechanical: Sequential compression devices, below knee Bilateral lower extremities Pharmaceutical: Lovenox   -antiplatelet therapy: N/A  3. Pain Management: Tylenol  1000 mg 3 times daily, gabapentin  300 mg 3 times daily, lidocaine  5% patch, Robaxin  500 mg 3 times daily.  PRN: Tylenol  650 mg and Oxycodone  5 mg   4. Mood/Behavior/Sleep: LCSW to follow for evaluation and support when available.   -antipsychotic agents: N/A  5. Neuropsych/cognition: This patient *** capable of making decisions on *** own behalf.  6. Skin/Wound Care: Routine pressure relief measures.   7. Fluids/Electrolytes/Nutrition: Monitor I&O and weight. Follow up labs CBC/CMP     8. Right hip septic arthritis/MSSA bactermia: Afebrile, blood cultures neg from 12/14.  Per ID continue IV cefazolin  course EOT 12/12/2024. PICC line to be placed in CIR 12/18.   9. Hypertension: Monitor BP per protocol. Continue losartan -hydrochlorothiazide  50-12.5mg  daily at home.   10.OSA: Continue CPAP nightly.   11. Hyponatremia: 12/17 NA 139, continue to monitor with labs.   12.      ***  Daphne LOISE Satterfield, NP 11/04/2024     [1] No Known Allergies

## 2024-11-04 NOTE — H&P (Addendum)
 Physical Medicine and Rehabilitation Admission H&P     Functional deficits due to impaired mobility   HPI: Duane Greene is a 73 year old male with PMHx of HTN, HLD, OSA on CPAP, pulmonary embolism no longer on anticoagulation, and OA bilateral hips followed by [facility] .  Patient presented to [facility] ED on 10/26/2024 with complaints of right hip and groin pain. He was s/p US  guided steroid injection 12/2.  In the ED xray consistent for OA, he was discharged after receiving IV Toradol  and instructed to use p.o. oxycodone  and to follow-up with Ortho. Dr. Delane evaluated with MRI for possible avascular necrosis.  At home he became febrile his pain was unbearable.  Patient returned to the ED on 12/13 with temp 100.4 and labs notable for WBC 12.9, lactic acid 2, and CRP 27.6.  UA with rare bacteria and WBC 0-5.  Blood cultures were obtained and showed S. Aureus.  Orthopedic surgery and ID consulted for admission and placed on IV cefazolin .    Patient underwent right hip aspiration on 12/13 in IR and subsequently underwent I&D of the hip joint on 12/14.  Intraoperative cultures showed gram-positive cocci.  ID recommended TTE that showed LVEF of 55 to 60% with no regional wall motion abnormality and no mention of vegetation.  Cardiology consulted, patient then underwent TEE on 11/04/2024 with vegetation on the mitral valve.  Blood culture from 10/31/2024 have been negative for 4 days. ID continues to follow for course of treatment.    Prior to arrival he was active and independent, driving in the community with use of DME. He lives with his spouse in a once level home with 3 stairs to enter. He currently requires moderate assistance, +2 for physical assistance, +2 for safety/equipment with ADLs  and bed mobility. Min assist to transfer with RW.  Therapy evaluations completed due to patient decreased functional mobility was admitted for a comprehensive rehab program.     ROS:  -  CONSTITUTIONAL: Denies weight loss, fever and chills.  - HEENT: Denies changes in vision and hearing.  - RESPIRATORY: Denies SOB and cough.  - CV: Denies palpitations and CP.  - GI: Denies abdominal pain, nausea, vomiting and diarrhea.  - GU: Denies dysuria and urinary frequency.  - MSK: Denies myalgia; + RLE joint pain  - SKIN: Denies rash and pruritus.  - NEUROLOGICAL: Denies headache and syncope.  - PSYCHIATRIC: Denies recent changes in mood. Denies anxiety and depression.          Past Medical History:  Diagnosis Date   Arthritis     History of kidney stones     Hyperlipidemia     Hypertension     Right inguinal hernia     Sleep apnea      uses CPAP nightly             Past Surgical History:  Procedure Laterality Date   CHOLECYSTECTOMY N/A 06/13/2020    Procedure: LAPAROSCOPIC CHOLECYSTECTOMY;  Surgeon: Belinda Cough, MD;  Location: [facility] OR;  Service: General;  Laterality: N/A;   COLONOSCOPY       EXTRACORPOREAL SHOCK WAVE LITHOTRIPSY Left 10/18/2019    Procedure: EXTRACORPOREAL SHOCK WAVE LITHOTRIPSY (ESWL);  Surgeon: Renda Glance, MD;  Location: [facility] ORS;  Service: Urology;  Laterality: Left;   INCISION AND DRAINAGE OF WOUND Right 10/31/2024    Procedure: IRRIGATION AND DEBRIDEMENT WOUND;  Surgeon: Georgina Ozell LABOR, MD;  Location: [facility] OR;  Service: Orthopedics;  Laterality: Right;  INGUINAL HERNIA REPAIR Right 06/30/2019    Procedure: RIGHT INGUINAL HERNIA REPAIR WITH MESH;  Surgeon: Belinda Cough, MD;  Location: [facility];  Service: General;  Laterality: Right;   INTRAOPERATIVE CHOLANGIOGRAM N/A 06/13/2020    Procedure: INTRAOPERATIVE CHOLANGIOGRAM;  Surgeon: Belinda Cough, MD;  Location: [facility] OR;  Service: General;  Laterality: N/A;   KNEE ARTHROSCOPY Left     LUMBAR LAMINECTOMY/DECOMPRESSION MICRODISCECTOMY N/A 08/28/2020    Procedure: Laminectomy - Lumbar One-Two, Lumbar Two-Three, Lumbar Three-Four, Lumbar Four-Five, Lumbar Five-Sacral One;   Surgeon: Joshua Alm RAMAN, MD;  Location: [facility] OR;  Service: Neurosurgery;  Laterality: N/A;  posterior   REVERSE SHOULDER ARTHROPLASTY Right 09/06/2022    Procedure: REVERSE Right SHOULDER ARTHROPLASTY;  Surgeon: Kay Kemps, MD;  Location: [facility] ORS;  Service: Orthopedics;  Laterality: Right;  120 hours choice and interscalene block   SHOULDER ARTHROSCOPY Bilateral     TONSILLECTOMY       TRANSESOPHAGEAL ECHOCARDIOGRAM (CATH LAB) N/A 11/03/2024    Procedure: TRANSESOPHAGEAL ECHOCARDIOGRAM;  Surgeon: Sheena Pugh, DO;  Location: [facility] INVASIVE CV LAB;  Service: Cardiovascular;  Laterality: N/A;             Family History  Problem Relation Age of Onset   Heart Problems Father     CVA Father          Social History:  reports that he has never smoked. He has never used smokeless tobacco. He reports that he does not drink alcohol and does not use drugs. Allergies: [Allergies]  [Allergies] No Known Allergies       Medications Prior to Admission  Medication Sig Dispense Refill   aspirin  EC 81 MG tablet Take 81 mg by mouth at bedtime. Swallow whole.       cholecalciferol  (VITAMIN D3) 25 MCG (1000 UNIT) tablet Take 1,000 Units by mouth daily.       losartan -hydrochlorothiazide  (HYZAAR ) 50-12.5 MG tablet Take 1 tablet by mouth daily. 100 tablet 3   Multiple Vitamins-Minerals (MULTIVITAMIN WITH MINERALS) tablet Take 1 tablet by mouth at bedtime.       oxyCODONE  (OXY IR/ROXICODONE ) 5 MG immediate release tablet Take 1 tablet (5 mg total) by mouth every 6 (six) hours as needed for pain for 7 days. 28 tablet 0   simvastatin  (ZOCOR ) 40 MG tablet Take 1 tablet (40 mg total) by mouth every evening. (Patient taking differently: Take 40 mg by mouth at bedtime.) 100 tablet 3   terbinafine  (LAMISIL ) 250 MG tablet Take 1 tablet (250 mg total) by mouth every 7 (seven) days to prevent onychomycosis. (Patient taking differently: Take 250 mg by mouth every Monday.) 12 tablet 2   triamcinolone  cream (KENALOG ) 0.1 %  Apply 1 Application topically daily as needed. 15 g 3   [DISCONTINUED] gabapentin  (NEURONTIN ) 300 MG capsule Take 1 capsule (300 mg total) by mouth at bedtime. 90 capsule 3              Home: Home Living Family/patient expects to be discharged to:: Private residence Living Arrangements: Spouse/significant other Available Help at Discharge: Family, Available 24 hours/day Type of Home: House Home Access: Stairs to enter [employer] of Steps: 3 Entrance Stairs-Rails: None Home Layout: One level Bathroom Shower/Tub: Health Visitor: Handicapped height Bathroom Accessibility: Yes Home Equipment: [employer]ant (2 wheels), [employer] - single point, Information systems manager - built in, [employer] held shower head, BSC/3in1  Lives With: Spouse   Functional History: Prior Function Prior Level of Function : Independent/Modified Independent, Driving Mobility Comments:  Prior to last week, pt was ind with no AD however since then has been barely mobile with RW due to hip pain. ADLs Comments: Ind prior to last week   Functional Status:  Mobility: Bed Mobility Overal bed mobility: Needs Assistance Bed Mobility: Supine to Sit Rolling: Mod assist, Max assist Sidelying to sit: Max assist Supine to sit: Min assist Sit to supine: Mod assist General bed mobility comments: RLE management, significantly increased time, cues for use of bed rail and sequencing Transfers Overall transfer level: Needs assistance Equipment used: Rolling walker (2 wheels), Ambulation equipment used Transfers: Sit to/from Stand, Bed to chair/wheelchair/BSC Sit to Stand: From elevated surface, Min assist Bed to/from chair/wheelchair/BSC transfer type:: Step pivot Step pivot transfers: Min assist, +2 physical assistance, +2 safety/equipment General transfer comment: Rocking forward to gain momentum, cues for hand placement, minA to power up to standing Ambulation/Gait Ambulation/Gait assistance: Min assist, +2  safety/equipment Gait Distance (Feet): 15 Feet (15, 15) Assistive device: Rolling walker (2 wheels) Gait Pattern/deviations: Decreased stride length, Step-to pattern, Trunk flexed, Decreased weight shift to left, Decreased dorsiflexion - left, Decreased stance time - left General Gait Details: Verbal cues for sequencing/technique, upright posture. Pt with increased R foot external rotation (pt spouse states is baseline). Pt with step to gait pattern. Gait velocity: decreased Gait velocity interpretation: <1.31 ft/sec, indicative of household ambulator   ADL: ADL Overall ADL's : Needs assistance/impaired Eating/Feeding: Set up, Sitting Grooming: Wash/dry face, Set up, Sitting Upper Body Bathing: Minimal assistance, Sitting Lower Body Bathing: Maximal assistance Upper Body Dressing : Set up, Sitting Lower Body Dressing: Maximal assistance Lower Body Dressing Details (indicate cue type and reason): Sit EOB Toilet Transfer: Moderate assistance, +2 for physical assistance, +2 for safety/equipment, BSC/3in1, Rolling walker (2 wheels) Toileting- Clothing Manipulation and Hygiene: Total assistance, Sit to/from stand, Bed level Toileting - Clothing Manipulation Details (indicate cue type and reason): Pt soiled in bed with BM. Completed posterior peri care bed level with Pt rolling to L and R sides. Pt with additional active BM at beginning of supine to sit transfer. Posterior completed for 2nd BM with Pt in standing.   Cognition: Cognition Orientation Level: Oriented X4 Cognition Arousal: Alert Behavior During Therapy: WFL for tasks assessed/performed   Physical Exam: Blood pressure 117/63, pulse 70, temperature 98.8 F (37.1 C), resp. rate 16, height 5' 7 (1.702 m), weight 89.8 kg, SpO2 97%. Physical Exam   PE: Constitution: Appropriate appearance for age. No apparent distress   Resp: No respiratory distress. No accessory muscle usage. on RA and CTAB Cardio: Well perfused appearance.  2+ R thigh edema extending into superior knee. 2+ pulses Abdomen: Nondistended. Nontender.  +BS Psych: Appropriate mood and affect. Skin: c/d/I; hip dressing with scant dried blood MSK: + LLE TKA well healed + RLE palpable suprapatellar effusion, no erythema or warmth  Neurologic Exam:   DTRs: Reflexes were 2+ in bilateral achilles, patella, biceps, BR and triceps. Babinsky: flexor responses b/l.   Hoffmans: negative b/l Sensory exam: revealed normal sensation in all dermatomal regions in bilateral upper extremities and bilateral lower extremities Motor exam: strength 5/5 throughout bilateral upper extremities RLE 2/5 HF, 2/5 KE - limited by pain; 4/5 DF, PF LLE 3/5 HF, 5/5 KE, 5/5 DF, 5/5 PF  Coordination: Fine motor coordination was normal.        Lab Results Last 48 Hours        Results for orders placed or performed during the hospital encounter of 10/29/24 (from the past 48 hours)  CBC     Status: Abnormal    Collection Time: 11/03/24  5:08 AM  Result Value Ref Range    WBC 10.0 4.0 - 10.5 K/uL    RBC 3.99 (L) 4.22 - 5.81 MIL/uL    Hemoglobin 13.1 13.0 - 17.0 g/dL    HCT 62.1 (L) 60.9 - 52.0 %    MCV 94.7 80.0 - 100.0 fL    MCH 32.8 26.0 - 34.0 pg    MCHC 34.7 30.0 - 36.0 g/dL    RDW 86.3 88.4 - 84.4 %    Platelets 248 150 - 400 K/uL    nRBC 0.0 0.0 - 0.2 %      Comment: Performed at [facility], [address], [place], [place] [address]  Basic metabolic panel with GFR     Status: Abnormal    Collection Time: 11/03/24  5:08 AM  Result Value Ref Range    Sodium 139 135 - 145 mmol/L    Potassium 4.0 3.5 - 5.1 mmol/L    Chloride 103 98 - 111 mmol/L    CO2 28 22 - 32 mmol/L    Glucose, Bld 105 (H) 70 - 99 mg/dL      Comment: Glucose reference range applies only to samples taken after fasting for at least 8 hours.    BUN 23 8 - 23 mg/dL    Creatinine, Ser 9.33 0.61 - 1.24 mg/dL    Calcium 8.5 (L) 8.9 - 10.3 mg/dL    GFR, Estimated >39 >39 mL/min      Comment:  (NOTE) Calculated using the CKD-EPI Creatinine Equation (2021)      Anion gap 8 5 - 15      Comment: Performed at [facility], [address] [place], [place] [address]      Imaging Results (Last 48 hours)  US  EKG SITE RITE Result Date: 11/04/2024 If Site Rite image not attached, placement could not be confirmed due to current cardiac rhythm.   ECHO TEE Result Date: 11/03/2024    TRANSESOPHOGEAL ECHO REPORT   Patient Name:   Suhaan L Pryce Date of Exam: 11/03/2024 Medical Rec #:  [id number]      Height:       67.0 in Accession #:    [id number]     Weight:       198.0 lb Date of Birth:  30-Oct-1951      BSA:          2.014 m Patient Age:    73 years       BP:           124/55 mmHg Patient Gender: M              HR:           77 bpm. Exam Location:  Inpatient Procedure: Transesophageal Echo, 3D Echo, Color Doppler and Cardiac Doppler            (Both Spectral and Color Flow Doppler were utilized during            procedure). Indications:     Endocarditis  History:         Patient has prior history of Echocardiogram examinations, most                  recent 11/01/2024. Risk Factors:Hypertension, Dyslipidemia and                  Sleep Apnea.  Sonographer:  Koleen Popper RDCS Referring Phys:  [id number] [facility] Diagnosing Phys: Kardie Tobb DO PROCEDURE: After discussion of the risks and benefits of a TEE, an informed consent was obtained from the patient. The transesophogeal probe was passed without difficulty through the esophogus of the patient. Imaged were obtained with the patient in a left lateral decubitus position. Sedation performed by different physician. The patient was monitored while under deep sedation. Anesthestetic sedation was provided intravenously by Anesthesiology: 111mg  of Propofol . Image quality was good. The patient's  vital signs; including heart rate, blood pressure, and oxygen saturation; remained stable throughout the procedure. The patient developed no complications  during the procedure.  IMPRESSIONS  1. Left ventricular ejection fraction, by estimation, is 55 to 60%. The left ventricle has normal function.  2. Right ventricular systolic function is normal. The right ventricular size is normal.  3. No left atrial/left atrial appendage thrombus was detected. The LAA emptying velocity was 47 cm/s.  4. Very small echodense mobile mass on the ventricular side of the anterior mitral leaflet. The mitral valve is abnormal. Mild mitral valve regurgitation. Mild mitral stenosis.  5. The aortic valve is tricuspid. Aortic valve regurgitation is trivial. No aortic stenosis is present.  6. 3D performed of the mitral valve. Conclusion(s)/Recommendation(s): Findings are concerning for vegetation/infective endocarditis as detailed above. Findings concerning for mitral valve vegetation. FINDINGS  Left Ventricle: Left ventricular ejection fraction, by estimation, is 55 to 60%. The left ventricle has normal function. The left ventricular internal cavity size was normal in size. Right Ventricle: The right ventricular size is normal. Right ventricular systolic function is normal. Left Atrium: Left atrial size was not well visualized. No left atrial/left atrial appendage thrombus was detected. The LAA emptying velocity was 47 cm/s. Right Atrium: Right atrial size was not well visualized. Pericardium: There is no evidence of pericardial effusion. Presence of epicardial fat layer. Mitral Valve: Very small echodense mobile mass on the ventricular side of the anterior mitral leaflet. The mitral valve is abnormal. Mild mitral valve regurgitation. Mild mitral valve stenosis. Tricuspid Valve: The tricuspid valve is normal in structure. Tricuspid valve regurgitation is not demonstrated. No evidence of tricuspid stenosis. Aortic Valve: The aortic valve is tricuspid. Aortic valve regurgitation is trivial. No aortic stenosis is present. Pulmonic Valve: The pulmonic valve was not well visualized. Pulmonic valve  regurgitation is trivial. No evidence of pulmonic stenosis. Aorta: The aortic root and ascending aorta are structurally normal, with no evidence of dilitation. There is minimal (Grade I) layered plaque. Venous: The left upper pulmonary vein, left lower pulmonary vein, right upper pulmonary vein and right lower pulmonary vein are normal. A normal flow pattern is recorded from the right upper pulmonary vein. IAS/Shunts: No atrial level shunt detected by color flow Doppler. Additional Comments: Spectral Doppler performed.  AORTA Ao Asc diam: 3.20 cm Dub Tobb DO Electronically signed by Dub Huntsman DO Signature Date/Time: 11/03/2024/3:20:41 PM    Final     EP STUDY Result Date: 11/03/2024 See surgical note for result.   CT HEAD WO CONTRAST ( ) Result Date: 11/02/2024 EXAM: CT HEAD WITHOUT CONTRAST 11/02/2024 03:36:33 PM TECHNIQUE: CT of the head was performed without the administration of intravenous contrast. Automated exposure control, iterative reconstruction, and/or weight based adjustment of the mA/kV was utilized to reduce the radiation dose to as low as reasonably achievable. COMPARISON: None available. CLINICAL HISTORY: Waxing and waning confusion FINDINGS: BRAIN AND VENTRICLES: No acute hemorrhage. No evidence of acute infarct. No hydrocephalus. No extra-axial collection. No mass  effect or midline shift. Periventricular and subcortical white matter hypoattenuation, consistent with moderate chronic ischemic microvascular disease. Calcified atherosclerotic plaque within cavernous/supraclinoid internal carotid arteries. ORBITS: No acute abnormality. SINUSES: No acute abnormality. SOFT TISSUES AND SKULL: No acute soft tissue abnormality. No skull fracture. IMPRESSION: 1. No acute intracranial abnormality. Electronically signed by: Donnice Mania MD 11/02/2024 09:20 PM EST RP Workstation: [id number]            Blood pressure 117/63, pulse 70, temperature 98.8 F (37.1 C), resp. rate 16, height 5'  7 (1.702 m), weight 89.8 kg, SpO2 97%.   Medical Problem List and Plan: 1. Functional deficits secondary to R hip septic arthritis s/p I&D 12/14              -patient may shower with surgical sites covered             -ELOS/Goals: 10-14 days, SPV   - Stable for IRF   2.  Antithrombotics: -DVT/anticoagulation:  Mechanical: Sequential compression devices, below knee Bilateral lower extremities Pharmaceutical: Lovenox              -antiplatelet therapy: N/A   3. Pain Management: Tylenol  1000 mg 3 times daily, gabapentin  300 mg 3 times daily, lidocaine  5% patch, Robaxin  500 mg 3 times daily.  PRN: Tylenol  650 mg and Oxycodone  5 mg    4. Mood/Behavior/Sleep: LCSW to follow for evaluation and support when available.              -antipsychotic agents: N/A   5. Neuropsych/cognition: This patient is capable of making decisions on his own behalf.   6. Skin/Wound Care: Routine pressure relief measures.    7. Fluids/Electrolytes/Nutrition: Monitor I&O and weight. Follow up labs CBC/CMP     8. Right hip septic arthritis/MSSA bactermia: Afebrile, blood cultures neg from 12/14.  Per ID continue IV cefazolin  course EOT 12/12/2024. PICC line to be placed in CIR 12/18.    9. Hypertension: Monitor BP per protocol. Continue losartan -hydrochlorothiazide  50-12.5mg  daily at home.    10.OSA: Continue CPAP nightly.    11. Hyponatremia: 12/17 NA 139, continue to monitor with labs.      Daphne LOISE Satterfield, NP 11/04/2024  I have examined the patient independently and edited the note for HPI, ROS, exam, assessment, and plan as appropriate. I am in agreement with the above recommendations.   Joesph JAYSON Likes, DO 11/04/2024

## 2024-11-04 NOTE — Progress Notes (Addendum)
 PROGRESS NOTE  Duane Greene FMW:[id number] DOB: Dec 29, 1950 DOA: 10/29/2024 PCP: [name] LABOR, MD   LOS: 5 days   Brief narrative:  Duane Greene is a 73 y.o. male with past medical history significant for hypertension hyperlipidemia, obstructive sleep apnea on CPAP, and OA bilateral hips s/p US  guided steroid injection on 10/19/24 presented to hospital with right hip joint swelling since injection with decreased mobility.  Patient then presented to [facility] ED on 12/ 9/25 where an x-ray was done which was consistent with osteoarthritis so was given medications to go home with.  Subsequently, patient also saw orthopedics in outpatient MRI was ordered.  Patient then went home but then despite taking medications for pain it was unbearable so presented to the ED.   In the ED, patient was febrile with temperature of 100.4 deg F x1. Labs with notable for WBC 12.9-->12.3, lactic acid 2-->1.1, and CRP 27.6.  Urinalysis showed are bacteruria and WBC 0-5. Blood cultures were sent from the ED was consistent with S. aureus. ED provider consulted orthopedic surgery, ID, and  patient was admitted to the hospital for further evaluation and treatment.   During hospitalization, patient was seen by orthopedics and underwent irrigation and debridement of the right hip for septic arthritis.  Subsequently underwent TEE with findings of mitral valve vegetation.  At this time ID is involved.  Ultimate plan CIR.   Assessment/Plan: Principal Problem:   Septic arthritis (HCC) Active Problems:   Bacteremia  Right hip septic arthritis MSSA bacteremia Infectious disease on board and currently on IV cefazolin .  Orthopedics was consulted and patient underwent right hip aspiration on 10/30/2024.  Patient then underwent I&D of the hip joint and underwent irrigation and debridement of the right hip on 10/31/2024.    Intraoperative cultures showing gram-positive cocci.  ID recommended TTE which showed LV ejection  fraction of 55 to 60% with no regional wall motion abnormality and no mention of vegetation.  Patient then underwent TEE on 11/04/2024 with vegetation on the mitral valve.  Blood culture from 10/31/2024 negative in 4 days.  Follow ID recommendations on treatment plan.  No leukocytosis but temperature max was 100.1 F over the last 24 hours.  Essential hypertension Patient is on losartan -hydrochlorothiazide  50-12.5mg  daily at home.  Blood pressure stable at this time.   OSA Continue CPAP at nighttime.   Hyponatremia. Mild improved.  Sodium level of 139  DVT prophylaxis: enoxaparin  (LOVENOX ) injection 40 mg Start: 10/30/24 0930 SCDs Start: 10/30/24 9083   Disposition: PT OT recommends CIR at this time  Status is: Inpatient Remains inpatient appropriate because: IV antibiotic, need for rehabilitation    Code Status:     Code Status: Limited: Do not attempt resuscitation (DNR) -DNR-LIMITED -Do Not Intubate/DNI   Family Communication:  Spoke with the patient's spouse at bedside on 11/04/2024.  Consultants: Orthopedics Infectious disease  Procedures: Right hip aspiration 10/30/2024 Irrigation and debridement of the right hip on 10/31/2024 TEE 11/03/2024  Anti-infectives:  Cefazolin  IV  Anti-infectives (From admission, onward)    Start     Dose/Rate Route Frequency Ordered Stop   11/01/24 1000  terbinafine  (LAMISIL ) tablet 250 mg        250 mg Oral Every Mon 10/30/24 0811     10/31/24 1738  vancomycin  (VANCOCIN ) powder  Status:  Discontinued          As needed 10/31/24 1738 10/31/24 1827   10/30/24 2300  vancomycin  (VANCOCIN ) IVPB 1000 mg/200 mL premix  Status:  Discontinued  1,000 mg 200 mL/hr over 60 Minutes Intravenous Every 12 hours 10/30/24 0959 10/30/24 1002   10/30/24 1000  vancomycin  (VANCOREADY) IVPB 1750 mg/350 mL  Status:  Discontinued        1,750 mg 175 mL/hr over 120 Minutes Intravenous  Once 10/30/24 0952 10/30/24 1002   10/30/24 1000  ceFAZolin   (ANCEF ) IVPB 2g/100 mL premix        2 g 200 mL/hr over 30 Minutes Intravenous Every 8 hours 10/30/24 0957     10/29/24 2245  terbinafine  (LAMISIL ) tablet 250 mg  Status:  Discontinued        250 mg Oral Every 7 days 10/29/24 2235 10/30/24 0810        Subjective: Today, patient was seen and examined at bedside.  Patient states that he continues to feel better.  Denies any nausea vomiting.  Has mild sore throat from TEE yesterday otherwise okay.    Objective: Vitals:   11/04/24 0223 11/04/24 0815  BP: 137/67 117/63  Pulse: 66 70  Resp: 14 16  Temp: 98.7 F (37.1 C) 98.8 F (37.1 C)  SpO2: 98% 97%    Intake/Output Summary (Last 24 hours) at 11/04/2024 0840 Last data filed at 11/04/2024 9361 Gross per 24 hour  Intake 969.72 ml  Output 1600 ml  Net -630.28 ml    Filed Weights   11/03/24 1043  Weight: 89.8 kg   Body mass index is 31.01 kg/m.   Physical Exam:  General:  Average built, not in obvious distress, alert awake and oriented, HENT:   No scleral pallor or icterus noted. Oral mucosa is moist.  Chest:  Clear breath sounds.  No crackles or wheezes.  CVS: S1 &S2 heard. No murmur.  Regular rate and rhythm. Abdomen: Soft, nontender, nondistended.  Bowel sounds are heard.   Extremities: No cyanosis, clubbing or edema.  Peripheral pulses are palpable.  Right hip with difficulty movement of the hip Psych: Alert, awake and oriented, normal mood CNS:  No cranial nerve deficits.  Moves all extremities Skin: Warm and dry.  No rashes noted.   Data Review: I have personally reviewed the following laboratory data and studies,  CBC: Recent Labs  Lab 10/29/24 1225 10/30/24 1000 10/31/24 0013 11/01/24 0555 11/03/24 0508  WBC 12.9* 12.3* 14.4* 14.9* 10.0  NEUTROABS 10.0* 9.6*  --  13.1*  --   HGB 15.3 14.4 13.8 13.2 13.1  HCT 44.4 40.5 38.9* 37.8* 37.8*  MCV 94.3 92.9 91.5 92.4 94.7  PLT 124* 106* 133* 165 248   Basic Metabolic Panel: Recent Labs  Lab  10/29/24 1225 10/30/24 1000 10/31/24 0013 11/03/24 0508  NA 132* 134* 135 139  K 3.5 3.6 3.5 4.0  CL 93* 100 99 103  CO2 27 26 25 28   GLUCOSE 114* 165* 184* 105*  BUN 20 22 25* 23  CREATININE 1.01 0.88  0.87 0.88 0.66  CALCIUM 8.3* 7.9* 8.3* 8.5*   Liver Function Tests: Recent Labs  Lab 10/29/24 1225 10/30/24 1000  AST 48* 51*  ALT 55* 50*  ALKPHOS 98 103  BILITOT 1.8* 1.2  PROT 7.1 5.8*  ALBUMIN 3.1* 2.5*   No results for input(s): LIPASE, AMYLASE in the last 168 hours. No results for input(s): AMMONIA in the last 168 hours. Cardiac Enzymes: Recent Labs  Lab 10/30/24 1000  CKTOTAL 148   BNP (last 3 results) No results for input(s): BNP in the last 8760 hours.  ProBNP (last 3 results) No results for input(s): PROBNP in the last  8760 hours.  CBG: Recent Labs  Lab 11/02/24 0559  GLUCAP 149*   Recent Results (from the past 240 hours)  Blood Culture (routine x 2)     Status: Abnormal   Collection Time: 10/29/24  5:09 PM   Specimen: BLOOD LEFT ARM  Result Value Ref Range Status   Specimen Description BLOOD LEFT ARM  Final   Special Requests   Final    BOTTLES DRAWN AEROBIC AND ANAEROBIC Blood Culture adequate volume   Culture  Setup Time   Final    GRAM POSITIVE COCCI IN BOTH AEROBIC AND ANAEROBIC BOTTLES CRITICAL RESULT CALLED TO, READ BACK BY AND VERIFIED WITH: PHARMD EMERSON GLATTER 1212 AT 0955, ADC Performed at [facility], [address] [place], [place] [address]    Culture STAPHYLOCOCCUS AUREUS (A)  Final   Report Status 11/01/2024 FINAL  Final   Organism ID, Bacteria STAPHYLOCOCCUS AUREUS  Final      Susceptibility   Staphylococcus aureus - MIC*    CIPROFLOXACIN <=0.5 SENSITIVE Sensitive     ERYTHROMYCIN <=0.25 SENSITIVE Sensitive     GENTAMICIN <=0.5 SENSITIVE Sensitive     OXACILLIN 0.5 SENSITIVE Sensitive     TETRACYCLINE <=1 SENSITIVE Sensitive     VANCOMYCIN  1 SENSITIVE Sensitive     TRIMETH/SULFA <=10 SENSITIVE Sensitive      CLINDAMYCIN <=0.25 SENSITIVE Sensitive     RIFAMPIN <=0.5 SENSITIVE Sensitive     Inducible Clindamycin NEGATIVE Sensitive     LINEZOLID 2 SENSITIVE Sensitive     * STAPHYLOCOCCUS AUREUS  Blood Culture ID Panel (Reflexed)     Status: Abnormal   Collection Time: 10/29/24  5:09 PM  Result Value Ref Range Status   Enterococcus faecalis NOT DETECTED NOT DETECTED Final   Enterococcus Faecium NOT DETECTED NOT DETECTED Final   Listeria monocytogenes NOT DETECTED NOT DETECTED Final   Staphylococcus species DETECTED (A) NOT DETECTED Final    Comment: CRITICAL RESULT CALLED TO, READ BACK BY AND VERIFIED WITH: PHARMD M. MITCHELL 1212 AT 0955, ADC    Staphylococcus aureus (BCID) DETECTED (A) NOT DETECTED Final    Comment: CRITICAL RESULT CALLED TO, READ BACK BY AND VERIFIED WITH: PHARMD M. MITCHELL 1212 AT 0955, ADC    Staphylococcus epidermidis NOT DETECTED NOT DETECTED Final   Staphylococcus lugdunensis NOT DETECTED NOT DETECTED Final   Streptococcus species NOT DETECTED NOT DETECTED Final   Streptococcus agalactiae NOT DETECTED NOT DETECTED Final   Streptococcus pneumoniae NOT DETECTED NOT DETECTED Final   Streptococcus pyogenes NOT DETECTED NOT DETECTED Final   A.calcoaceticus-baumannii NOT DETECTED NOT DETECTED Final   Bacteroides fragilis NOT DETECTED NOT DETECTED Final   Enterobacterales NOT DETECTED NOT DETECTED Final   Enterobacter cloacae complex NOT DETECTED NOT DETECTED Final   Escherichia coli NOT DETECTED NOT DETECTED Final   Klebsiella aerogenes NOT DETECTED NOT DETECTED Final   Klebsiella oxytoca NOT DETECTED NOT DETECTED Final   Klebsiella pneumoniae NOT DETECTED NOT DETECTED Final   Proteus species NOT DETECTED NOT DETECTED Final   Salmonella species NOT DETECTED NOT DETECTED Final   Serratia marcescens NOT DETECTED NOT DETECTED Final   Haemophilus influenzae NOT DETECTED NOT DETECTED Final   Neisseria meningitidis NOT DETECTED NOT DETECTED Final   Pseudomonas  aeruginosa NOT DETECTED NOT DETECTED Final   Stenotrophomonas maltophilia NOT DETECTED NOT DETECTED Final   Candida albicans NOT DETECTED NOT DETECTED Final   Candida auris NOT DETECTED NOT DETECTED Final   Candida glabrata NOT DETECTED NOT DETECTED Final  Candida krusei NOT DETECTED NOT DETECTED Final   Candida parapsilosis NOT DETECTED NOT DETECTED Final   Candida tropicalis NOT DETECTED NOT DETECTED Final   Cryptococcus neoformans/gattii NOT DETECTED NOT DETECTED Final   Meth resistant mecA/C and MREJ NOT DETECTED NOT DETECTED Final    Comment: Performed at [facility], [address] [place], [place] [address]  Blood Culture (routine x 2)     Status: Abnormal   Collection Time: 10/29/24  5:14 PM   Specimen: BLOOD RIGHT ARM  Result Value Ref Range Status   Specimen Description BLOOD RIGHT ARM  Final   Special Requests   Final    BOTTLES DRAWN AEROBIC AND ANAEROBIC Blood Culture adequate volume   Culture  Setup Time   Final    GRAM POSITIVE COCCI IN BOTH AEROBIC AND ANAEROBIC BOTTLES CRITICAL VALUE NOTED.  VALUE IS CONSISTENT WITH PREVIOUSLY REPORTED AND CALLED VALUE.    Culture (A)  Final    STAPHYLOCOCCUS AUREUS SUSCEPTIBILITIES PERFORMED ON PREVIOUS CULTURE WITHIN THE LAST 5 DAYS. Performed at [facility], [address], [place], [place] [address]    Report Status 11/01/2024 FINAL  Final  Resp panel by RT-PCR (RSV, Flu A&B, Covid) Anterior Nasal Swab     Status: None   Collection Time: 10/29/24  5:33 PM   Specimen: Anterior Nasal Swab  Result Value Ref Range Status   SARS Coronavirus 2 by RT PCR NEGATIVE NEGATIVE Final   Influenza A by PCR NEGATIVE NEGATIVE Final   Influenza B by PCR NEGATIVE NEGATIVE Final    Comment: (NOTE) The Xpert Xpress SARS-CoV-2/FLU/RSV plus assay is intended as an aid in the diagnosis of influenza from Nasopharyngeal swab specimens and should not be used as a sole basis for treatment. Nasal washings and aspirates are unacceptable  for Xpert Xpress SARS-CoV-2/FLU/RSV testing.  Fact Sheet for Patients: bloggercourse.com  Fact Sheet for Healthcare Providers: seriousbroker.it  This test is not yet approved or cleared by the [place]  FDA and has been authorized for detection and/or diagnosis of SARS-CoV-2 by FDA under an Emergency Use Authorization (EUA). This EUA will remain in effect (meaning this test can be used) for the duration of the COVID-19 declaration under Section 564(b)(1) of the Act, 21 U.S.C. section 360bbb-3(b)(1), unless the authorization is terminated or revoked.     Resp Syncytial Virus by PCR NEGATIVE NEGATIVE Final    Comment: (NOTE) Fact Sheet for Patients: bloggercourse.com  Fact Sheet for Healthcare Providers: seriousbroker.it  This test is not yet approved or cleared by the [place]  FDA and has been authorized for detection and/or diagnosis of SARS-CoV-2 by FDA under an Emergency Use Authorization (EUA). This EUA will remain in effect (meaning this test can be used) for the duration of the COVID-19 declaration under Section 564(b)(1) of the Act, 21 U.S.C. section 360bbb-3(b)(1), unless the authorization is terminated or revoked.  Performed at [facility], [address] [place], [place] [address]   Urine Culture     Status: None   Collection Time: 10/30/24  7:49 AM   Specimen: Urine, Clean Catch  Result Value Ref Range Status   Specimen Description URINE, CLEAN CATCH  Final   Special Requests NONE  Final   Culture   Final    NO GROWTH Performed at [facility], [address] [place], [place] [address]    Report Status 10/31/2024 FINAL  Final  Culture, blood (Routine X 2) w Reflex to ID Panel  Status: None (Preliminary result)   Collection Time: 10/31/24 12:15 AM   Specimen: BLOOD RIGHT HAND  Result Value Ref Range Status   Specimen Description  BLOOD RIGHT HAND  Final   Special Requests   Final    BOTTLES DRAWN AEROBIC AND ANAEROBIC Blood Culture adequate volume   Culture   Final    NO GROWTH 4 DAYS Performed at [facility], [address], [place], [place] [address]    Report Status PENDING  Incomplete  Culture, blood (Routine X 2) w Reflex to ID Panel     Status: None (Preliminary result)   Collection Time: 10/31/24 12:26 AM   Specimen: BLOOD RIGHT ARM  Result Value Ref Range Status   Specimen Description BLOOD RIGHT ARM  Final   Special Requests   Final    BOTTLES DRAWN AEROBIC AND ANAEROBIC Blood Culture adequate volume   Culture   Final    NO GROWTH 4 DAYS Performed at [facility], [address] [place], [place] [address]    Report Status PENDING  Incomplete  Aerobic/Anaerobic Culture w Gram Stain (surgical/deep wound)     Status: None (Preliminary result)   Collection Time: 10/31/24  5:45 PM   Specimen: Synovial, Right Hip; Body Fluid  Result Value Ref Range Status   Specimen Description SYNOVIAL  Final   Special Requests RIGHT HIP  Final   Gram Stain   Final    MODERATE GRAM POSITIVE COCCI RARE GRAM POSITIVE COCCI Performed at [facility], [address] [place], [place] [address]    Culture   Final    RARE STAPHYLOCOCCUS AUREUS NO ANAEROBES ISOLATED; CULTURE IN PROGRESS FOR 5 DAYS    Report Status PENDING  Incomplete   Organism ID, Bacteria STAPHYLOCOCCUS AUREUS  Final      Susceptibility   Staphylococcus aureus - MIC*    CIPROFLOXACIN <=0.5 SENSITIVE Sensitive     ERYTHROMYCIN <=0.25 SENSITIVE Sensitive     GENTAMICIN <=0.5 SENSITIVE Sensitive     OXACILLIN 0.5 SENSITIVE Sensitive     TETRACYCLINE <=1 SENSITIVE Sensitive     VANCOMYCIN  1 SENSITIVE Sensitive     TRIMETH/SULFA <=10 SENSITIVE Sensitive     CLINDAMYCIN <=0.25 SENSITIVE Sensitive     RIFAMPIN <=0.5 SENSITIVE Sensitive     Inducible Clindamycin NEGATIVE Sensitive     LINEZOLID 2 SENSITIVE Sensitive     * RARE  STAPHYLOCOCCUS AUREUS     Studies: ECHO TEE Result Date: 11/03/2024    TRANSESOPHOGEAL ECHO REPORT   Patient Name:   Sally L Keim Date of Exam: 11/03/2024 Medical Rec #:  [id number]      Height:       67.0 in Accession #:    [id number]     Weight:       198.0 lb Date of Birth:  [birth year]      BSA:          2.014 m Patient Age:    73 years       BP:           124/55 mmHg Patient Gender: M              HR:           77 bpm. Exam Location:  Inpatient Procedure: Transesophageal Echo, 3D Echo, Color Doppler and Cardiac Doppler            (Both Spectral and Color Flow Doppler were utilized during  procedure). Indications:     Endocarditis  History:         Patient has prior history of Echocardiogram examinations, most                  recent 11/01/2024. Risk Factors:Hypertension, Dyslipidemia and                  Sleep Apnea.  Sonographer:     Koleen Popper RDCS Referring Phys:  [id number] [facility] Diagnosing Phys: Kardie Tobb DO PROCEDURE: After discussion of the risks and benefits of a TEE, an informed consent was obtained from the patient. The transesophogeal probe was passed without difficulty through the esophogus of the patient. Imaged were obtained with the patient in a left lateral decubitus position. Sedation performed by different physician. The patient was monitored while under deep sedation. Anesthestetic sedation was provided intravenously by Anesthesiology: 111mg  of Propofol . Image quality was good. The patient's  vital signs; including heart rate, blood pressure, and oxygen saturation; remained stable throughout the procedure. The patient developed no complications during the procedure.  IMPRESSIONS  1. Left ventricular ejection fraction, by estimation, is 55 to 60%. The left ventricle has normal function.  2. Right ventricular systolic function is normal. The right ventricular size is normal.  3. No left atrial/left atrial appendage thrombus was detected. The LAA emptying velocity was  47 cm/s.  4. Very small echodense mobile mass on the ventricular side of the anterior mitral leaflet. The mitral valve is abnormal. Mild mitral valve regurgitation. Mild mitral stenosis.  5. The aortic valve is tricuspid. Aortic valve regurgitation is trivial. No aortic stenosis is present.  6. 3D performed of the mitral valve. Conclusion(s)/Recommendation(s): Findings are concerning for vegetation/infective endocarditis as detailed above. Findings concerning for mitral valve vegetation. FINDINGS  Left Ventricle: Left ventricular ejection fraction, by estimation, is 55 to 60%. The left ventricle has normal function. The left ventricular internal cavity size was normal in size. Right Ventricle: The right ventricular size is normal. Right ventricular systolic function is normal. Left Atrium: Left atrial size was not well visualized. No left atrial/left atrial appendage thrombus was detected. The LAA emptying velocity was 47 cm/s. Right Atrium: Right atrial size was not well visualized. Pericardium: There is no evidence of pericardial effusion. Presence of epicardial fat layer. Mitral Valve: Very small echodense mobile mass on the ventricular side of the anterior mitral leaflet. The mitral valve is abnormal. Mild mitral valve regurgitation. Mild mitral valve stenosis. Tricuspid Valve: The tricuspid valve is normal in structure. Tricuspid valve regurgitation is not demonstrated. No evidence of tricuspid stenosis. Aortic Valve: The aortic valve is tricuspid. Aortic valve regurgitation is trivial. No aortic stenosis is present. Pulmonic Valve: The pulmonic valve was not well visualized. Pulmonic valve regurgitation is trivial. No evidence of pulmonic stenosis. Aorta: The aortic root and ascending aorta are structurally normal, with no evidence of dilitation. There is minimal (Grade I) layered plaque. Venous: The left upper pulmonary vein, left lower pulmonary vein, right upper pulmonary vein and right lower pulmonary vein  are normal. A normal flow pattern is recorded from the right upper pulmonary vein. IAS/Shunts: No atrial level shunt detected by color flow Doppler. Additional Comments: Spectral Doppler performed.  AORTA Ao Asc diam: 3.20 cm Dub Tobb DO Electronically signed by Dub Huntsman DO Signature Date/Time: 11/03/2024/3:20:41 PM    Final    EP STUDY Result Date: 11/03/2024 See surgical note for result.  CT HEAD WO CONTRAST ( ) Result Date: 11/02/2024 EXAM: CT HEAD  WITHOUT CONTRAST 11/02/2024 03:36:33 PM TECHNIQUE: CT of the head was performed without the administration of intravenous contrast. Automated exposure control, iterative reconstruction, and/or weight based adjustment of the mA/kV was utilized to reduce the radiation dose to as low as reasonably achievable. COMPARISON: None available. CLINICAL HISTORY: Waxing and waning confusion FINDINGS: BRAIN AND VENTRICLES: No acute hemorrhage. No evidence of acute infarct. No hydrocephalus. No extra-axial collection. No mass effect or midline shift. Periventricular and subcortical white matter hypoattenuation, consistent with moderate chronic ischemic microvascular disease. Calcified atherosclerotic plaque within cavernous/supraclinoid internal carotid arteries. ORBITS: No acute abnormality. SINUSES: No acute abnormality. SOFT TISSUES AND SKULL: No acute soft tissue abnormality. No skull fracture. IMPRESSION: 1. No acute intracranial abnormality. Electronically signed by: Donnice Mania MD 11/02/2024 09:20 PM EST RP Workstation: [id number]      Vernal Alstrom, MD  [facility] 11/04/2024  If 7PM-7AM, please contact night-coverage

## 2024-11-04 NOTE — Progress Notes (Signed)
 PHARMACY CONSULT NOTE FOR:  OUTPATIENT  PARENTERAL ANTIBIOTIC THERAPY (OPAT)  Indication: MSSA R-hip septic arthritis + MV IE Regimen: Cefazolin  2g IV every 8 hours End date: 12/12/24 (6 weeks from BCx clearance 12/14)  IV antibiotic discharge orders are pended. To discharging provider:  please sign these orders via discharge navigator,  Select New Orders & click on the button choice - Manage This Unsigned Work.     Thank you for allowing pharmacy to be a part of this patients care.  Almarie Lunger, PharmD, BCPS, BCIDP Infectious Diseases Clinical Pharmacist 11/04/2024 3:14 PM   **Pharmacist phone directory can now be found on [email or web address] ([email or web address]).  Listed under [facility].

## 2024-11-05 DIAGNOSIS — M00051 Staphylococcal arthritis, right hip: Secondary | ICD-10-CM

## 2024-11-05 LAB — AEROBIC/ANAEROBIC CULTURE W GRAM STAIN (SURGICAL/DEEP WOUND)

## 2024-11-05 LAB — CBC WITH DIFFERENTIAL/PLATELET
Abs Immature Granulocytes: 0.19 K/uL — ABNORMAL HIGH (ref 0.00–0.07)
Basophils Absolute: 0.1 K/uL (ref 0.0–0.1)
Basophils Relative: 1 %
Eosinophils Absolute: 0.1 K/uL (ref 0.0–0.5)
Eosinophils Relative: 1 %
HCT: 36.6 % — ABNORMAL LOW (ref 39.0–52.0)
Hemoglobin: 12.5 g/dL — ABNORMAL LOW (ref 13.0–17.0)
Immature Granulocytes: 2 %
Lymphocytes Relative: 11 %
Lymphs Abs: 1.1 K/uL (ref 0.7–4.0)
MCH: 32.2 pg (ref 26.0–34.0)
MCHC: 34.2 g/dL (ref 30.0–36.0)
MCV: 94.3 fL (ref 80.0–100.0)
Monocytes Absolute: 0.9 K/uL (ref 0.1–1.0)
Monocytes Relative: 9 %
Neutro Abs: 8 K/uL — ABNORMAL HIGH (ref 1.7–7.7)
Neutrophils Relative %: 76 %
Platelets: 304 K/uL (ref 150–400)
RBC: 3.88 MIL/uL — ABNORMAL LOW (ref 4.22–5.81)
RDW: 13.7 % (ref 11.5–15.5)
WBC: 10.4 K/uL (ref 4.0–10.5)
nRBC: 0 % (ref 0.0–0.2)

## 2024-11-05 LAB — COMPREHENSIVE METABOLIC PANEL WITH GFR
ALT: 64 U/L — ABNORMAL HIGH (ref 0–44)
AST: 86 U/L — ABNORMAL HIGH (ref 15–41)
Albumin: 2.6 g/dL — ABNORMAL LOW (ref 3.5–5.0)
Alkaline Phosphatase: 216 U/L — ABNORMAL HIGH (ref 38–126)
Anion gap: 9 (ref 5–15)
BUN: 24 mg/dL — ABNORMAL HIGH (ref 8–23)
CO2: 24 mmol/L (ref 22–32)
Calcium: 8.5 mg/dL — ABNORMAL LOW (ref 8.9–10.3)
Chloride: 103 mmol/L (ref 98–111)
Creatinine, Ser: 0.61 mg/dL (ref 0.61–1.24)
GFR, Estimated: >60 mL/min
Glucose, Bld: 107 mg/dL — ABNORMAL HIGH (ref 70–99)
Potassium: 4.2 mmol/L (ref 3.5–5.1)
Sodium: 136 mmol/L (ref 135–145)
Total Bilirubin: 0.8 mg/dL (ref 0.0–1.2)
Total Protein: 6.1 g/dL — ABNORMAL LOW (ref 6.5–8.1)

## 2024-11-05 LAB — CULTURE, BLOOD (ROUTINE X 2)
Culture: NO GROWTH
Culture: NO GROWTH
Special Requests: ADEQUATE
Special Requests: ADEQUATE

## 2024-11-05 LAB — MAGNESIUM: Magnesium: 2.1 mg/dL (ref 1.7–2.4)

## 2024-11-05 MED ORDER — DOCUSATE SODIUM 100 MG PO CAPS
100.0000 mg | ORAL_CAPSULE | Freq: Every day | ORAL | Status: DC
  Administered 2024-11-05 – 2024-11-19 (×14): 100 mg via ORAL
  Filled 2024-11-05 (×10): qty 1

## 2024-11-05 NOTE — Plan of Care (Signed)
" °  Problem: RH Balance Goal: LTG Patient will maintain dynamic standing with ADLs (OT) Description: LTG:  Patient will maintain dynamic standing balance with assist during activities of daily living (OT)  Flowsheets (Taken 11/05/2024 1254) LTG: Pt will maintain dynamic standing balance during ADLs with: Supervision/Verbal cueing   Problem: Sit to Stand Goal: LTG:  Patient will perform sit to stand in prep for activites of daily living with assistance level (OT) Description: LTG:  Patient will perform sit to stand in prep for activites of daily living with assistance level (OT) Flowsheets (Taken 11/05/2024 1254) LTG: PT will perform sit to stand in prep for activites of daily living with assistance level: Supervision/Verbal cueing   Problem: RH Bathing Goal: LTG Patient will bathe all body parts with assist levels (OT) Description: LTG: Patient will bathe all body parts with assist levels (OT) Flowsheets (Taken 11/05/2024 1254) LTG: Pt will perform bathing with assistance level/cueing: Contact Guard/Touching assist   Problem: RH Dressing Goal: LTG Patient will perform lower body dressing w/assist (OT) Description: LTG: Patient will perform lower body dressing with assist, with/without cues in positioning using equipment (OT) Flowsheets (Taken 11/05/2024 1254) LTG: Pt will perform lower body dressing with assistance level of: Contact Guard/Touching assist   Problem: RH Toileting Goal: LTG Patient will perform toileting task (3/3 steps) with assistance level (OT) Description: LTG: Patient will perform toileting task (3/3 steps) with assistance level (OT)  Flowsheets (Taken 11/05/2024 1254) LTG: Pt will perform toileting task (3/3 steps) with assistance level: Contact Guard/Touching assist   Problem: RH Toilet Transfers Goal: LTG Patient will perform toilet transfers w/assist (OT) Description: LTG: Patient will perform toilet transfers with assist, with/without cues using equipment  (OT) Flowsheets (Taken 11/05/2024 1254) LTG: Pt will perform toilet transfers with assistance level of: Supervision/Verbal cueing   Problem: RH Tub/Shower Transfers Goal: LTG Patient will perform tub/shower transfers w/assist (OT) Description: LTG: Patient will perform tub/shower transfers with assist, with/without cues using equipment (OT) Flowsheets (Taken 11/05/2024 1254) LTG: Pt will perform tub/shower stall transfers with assistance level of: Supervision/Verbal cueing LTG: Pt will perform tub/shower transfers from: Walk in shower   "

## 2024-11-05 NOTE — Plan of Care (Signed)
" °  Problem: Consults Goal: RH GENERAL PATIENT EDUCATION Description: See Patient Education module for education specifics. Outcome: Progressing   Problem: RH BOWEL ELIMINATION Goal: RH STG MANAGE BOWEL WITH ASSISTANCE Description: STG Manage Bowel with mod I Assistance. Outcome: Progressing   Problem: RH SAFETY Goal: RH STG ADHERE TO SAFETY PRECAUTIONS W/ASSISTANCE/DEVICE Description: STG Adhere to Safety Precautions With cues  Assistance/Device. Outcome: Progressing   Problem: RH PAIN MANAGEMENT Goal: RH STG PAIN MANAGED AT OR BELOW PT'S PAIN GOAL Description: Pain < 4 with prns Outcome: Progressing   Problem: RH KNOWLEDGE DEFICIT GENERAL Goal: RH STG INCREASE KNOWLEDGE OF SELF CARE AFTER HOSPITALIZATION Description: Patient and wife will be able to manage care at discharge using educational resources for medications skin care, dietary modification independently Outcome: Progressing   "

## 2024-11-05 NOTE — IPOC Note (Addendum)
 Overall Plan of Care [facility]) Patient Details Name: Duane Greene MRN: [id number] DOB: June 01, 1951  Admitting Diagnosis: Septic arthritis [facility])  Hospital Problems: Principal Problem:   Septic arthritis (HCC) Active Problems:   MSSA bacteremia   Endocarditis of mitral valve     Functional Problem List: Nursing Bowel, Safety, Endurance, Medication Management, Pain  PT Balance, Pain, Endurance, Motor  OT Balance, Edema, Endurance, Motor, Pain  SLP    TR         Basic ADLs: OT Bathing, Dressing, Toileting     Advanced  ADLs: OT None     Transfers: PT Bed Mobility, Bed to Chair, Car, Occupational Psychologist, Research Scientist (life Sciences): PT Ambulation, Psychologist, [employer], Stairs     Additional Impairments: OT None  SLP        TR      Anticipated Outcomes Item Anticipated Outcome  Self Feeding no goal, pt is independent  Swallowing      Basic self-care  CGA bathing, LB dressing  Toileting  CGA toileting   Bathroom Transfers supervision  Bowel/Bladder  manage bowel w Mod I assist  Transfers  supervision  Locomotion  supervision  Communication     Cognition     Pain  Pain < 4 with prns  Safety/Judgment  manage safety w cues   Therapy Plan: PT Intensity: Minimum of 1-2 x/day ,45 to 90 minutes PT Frequency: 5 out of 7 days PT Duration Estimated Length of Stay: 10-12 days OT Intensity: Minimum of 1-2 x/day, 45 to 90 minutes OT Frequency: 5 out of 7 days OT Duration/Estimated Length of Stay: 10-12 days     Team Interventions: Nursing Interventions Discharge Planning, Medication Management, Disease Management/Prevention, Bowel Management, Pain Management, Patient/Family Education  PT interventions Ambulation/gait training, Community reintegration, Neuromuscular re-education, Stair training, UE/LE Strength taining/ROM, Wheelchair propulsion/positioning, UE/LE Coordination activities, Therapeutic Activities, Pain management, Discharge planning, Functional  mobility training, Patient/family education, Therapeutic Exercise  OT Interventions Balance/vestibular training, Discharge planning, DME/adaptive equipment instruction, Functional mobility training, Pain management, Patient/family education, Psychosocial support, Therapeutic Activities, Therapeutic Exercise, Self Care/advanced ADL retraining, UE/LE Strength taining/ROM  SLP Interventions    TR Interventions    SW/CM Interventions Discharge Planning, Psychosocial Support, Patient/Family Education   Barriers to Discharge MD  Medical stability and IV antibiotics  Nursing Decreased caregiver support, Home environment access/layout 1 level 3 ste no rail w spouse;pt was ind with no AD however since then has been barely mobile with RW due to hip pain.  PT Inaccessible home environment STE w/ and w/o rails.  OT None    SLP      SW IV antibiotics, Insurance for SNF coverage     Team Discharge Planning: Destination: PT-Home ,OT- Home , SLP-  Projected Follow-up: PT-Home health PT, OT-  Home health OT, SLP-  Projected Equipment Needs: PT-To be determined, OT- To be determined, SLP-  Equipment Details: PT-w/c w/ cushion possibly., OT-  Patient/family involved in discharge planning: PT- Patient, Family member/caregiver,  OT-Patient, Family member/caregiver, SLP-   MD ELOS: 10-14d Medical Rehab Prognosis:  Good Assessment: The patient has been admitted for CIR therapies with the diagnosis of Septic arthritis R hip. The team will be addressing functional mobility, strength, stamina, balance, safety, adaptive techniques and equipment, self-care, bowel and bladder mgt, patient and caregiver education, antibiotic treatment . Goals have been set at Mod I. Anticipated discharge destination is Home .        See Team Conference Notes for weekly updates to  the plan of care

## 2024-11-05 NOTE — Progress Notes (Signed)
 "                                                        PROGRESS NOTE   Subjective/Complaints: Slept ok, per wife has had bladder urgency stating 1 wk PTA Per pt bowels were constipated but got too loose on miralax   ROS- neg CP, SOB, N/V/D  Objective:   US  EKG SITE RITE Result Date: 11/04/2024 If Site Rite image not attached, placement could not be confirmed due to current cardiac rhythm.  ECHO TEE Result Date: 11/03/2024    TRANSESOPHOGEAL ECHO REPORT   Patient Name:   Duane Greene Date of Exam: 11/03/2024 Medical Rec #:  [id number]      Height:       67.0 in Accession #:    [id number]     Weight:       198.0 lb Date of Birth:  1951-04-26      BSA:          2.014 m Patient Age:    73 years       BP:           124/55 mmHg Patient Gender: M              HR:           77 bpm. Exam Location:  Inpatient Procedure: Transesophageal Echo, 3D Echo, Color Doppler and Cardiac Doppler            (Both Spectral and Color Flow Doppler were utilized during            procedure). Indications:     Endocarditis  History:         Patient has prior history of Echocardiogram examinations, most                  recent 11/01/2024. Risk Factors:Hypertension, Dyslipidemia and                  Sleep Apnea.  Sonographer:     Koleen Popper RDCS Referring Phys:  [id number] [facility] Diagnosing Phys: Kardie Tobb DO PROCEDURE: After discussion of the risks and benefits of a TEE, an informed consent was obtained from the patient. The transesophogeal probe was passed without difficulty through the esophogus of the patient. Imaged were obtained with the patient in a left lateral decubitus position. Sedation performed by different physician. The patient was monitored while under deep sedation. Anesthestetic sedation was provided intravenously by Anesthesiology: 111mg  of Propofol . Image quality was good. The patient's  vital signs; including heart rate, blood pressure, and oxygen saturation; remained stable throughout the  procedure. The patient developed no complications during the procedure.  IMPRESSIONS  1. Left ventricular ejection fraction, by estimation, is 55 to 60%. The left ventricle has normal function.  2. Right ventricular systolic function is normal. The right ventricular size is normal.  3. No left atrial/left atrial appendage thrombus was detected. The LAA emptying velocity was 47 cm/s.  4. Very small echodense mobile mass on the ventricular side of the anterior mitral leaflet. The mitral valve is abnormal. Mild mitral valve regurgitation. Mild mitral stenosis.  5. The aortic valve is tricuspid. Aortic valve regurgitation is trivial. No aortic stenosis is present.  6. 3D performed of the mitral valve. Conclusion(s)/Recommendation(s): Findings are concerning for vegetation/infective endocarditis as detailed  above. Findings concerning for mitral valve vegetation. FINDINGS  Left Ventricle: Left ventricular ejection fraction, by estimation, is 55 to 60%. The left ventricle has normal function. The left ventricular internal cavity size was normal in size. Right Ventricle: The right ventricular size is normal. Right ventricular systolic function is normal. Left Atrium: Left atrial size was not well visualized. No left atrial/left atrial appendage thrombus was detected. The LAA emptying velocity was 47 cm/s. Right Atrium: Right atrial size was not well visualized. Pericardium: There is no evidence of pericardial effusion. Presence of epicardial fat layer. Mitral Valve: Very small echodense mobile mass on the ventricular side of the anterior mitral leaflet. The mitral valve is abnormal. Mild mitral valve regurgitation. Mild mitral valve stenosis. Tricuspid Valve: The tricuspid valve is normal in structure. Tricuspid valve regurgitation is not demonstrated. No evidence of tricuspid stenosis. Aortic Valve: The aortic valve is tricuspid. Aortic valve regurgitation is trivial. No aortic stenosis is present. Pulmonic Valve: The  pulmonic valve was not well visualized. Pulmonic valve regurgitation is trivial. No evidence of pulmonic stenosis. Aorta: The aortic root and ascending aorta are structurally normal, with no evidence of dilitation. There is minimal (Grade I) layered plaque. Venous: The left upper pulmonary vein, left lower pulmonary vein, right upper pulmonary vein and right lower pulmonary vein are normal. A normal flow pattern is recorded from the right upper pulmonary vein. IAS/Shunts: No atrial level shunt detected by color flow Doppler. Additional Comments: Spectral Doppler performed.  AORTA Ao Asc diam: 3.20 cm Dub Tobb DO Electronically signed by Dub Huntsman DO Signature Date/Time: 11/03/2024/3:20:41 PM    Final    EP STUDY Result Date: 11/03/2024 See surgical note for result.  Recent Labs    11/03/24 0508 11/05/24 0556  WBC 10.0 10.4  HGB 13.1 12.5*  HCT 37.8* 36.6*  PLT 248 304   Recent Labs    11/03/24 0508 11/05/24 0556  NA 139 136  K 4.0 4.2  CL 103 103  CO2 28 24  GLUCOSE 105* 107*  BUN 23 24*  CREATININE 0.66 0.61  CALCIUM 8.5* 8.5*    Intake/Output Summary (Last 24 hours) at 11/05/2024 0717 Last data filed at 11/05/2024 0325 Gross per 24 hour  Intake 320 ml  Output --  Net 320 ml        Physical Exam: Vital Signs Blood pressure (!) 119/59, pulse 71, temperature 98.1 F (36.7 C), temperature source Oral, resp. rate 16, height 5' 7 (1.702 m), weight 91.8 kg, SpO2 96%.    Assessment/Plan: 1. Functional deficits which require 3+ hours per day of interdisciplinary therapy in a comprehensive inpatient rehab setting. Physiatrist is providing close team supervision and 24 hour management of active medical problems listed below. Physiatrist and rehab team continue to assess barriers to discharge/monitor patient progress toward functional and medical goals  Care Tool:  Bathing              Bathing assist       Upper Body Dressing/Undressing Upper body dressing         Upper body assist      Lower Body Dressing/Undressing Lower body dressing            Lower body assist       Toileting Toileting    Toileting assist       Transfers Chair/bed transfer  Transfers assist           Locomotion Ambulation   Ambulation assist  Walk 10 feet activity   Assist           Walk 50 feet activity   Assist           Walk 150 feet activity   Assist           Walk 10 feet on uneven surface  activity   Assist           Wheelchair     Assist               Wheelchair 50 feet with 2 turns activity    Assist            Wheelchair 150 feet activity     Assist          Blood pressure (!) 119/59, pulse 71, temperature 98.1 F (36.7 C), temperature source Oral, resp. rate 16, height 5' 7 (1.702 m), weight 91.8 kg, SpO2 96%.  Medical Problem List and Plan: 1. Functional deficits secondary to R hip septic arthritis s/p I&D 12/14              -patient may shower with surgical sites covered             -ELOS/Goals: 10-14 days, SPV              - Stable for IRF   2.  Antithrombotics: -DVT/anticoagulation:  Mechanical: Sequential compression devices, below knee Bilateral lower extremities Pharmaceutical: Lovenox              -antiplatelet therapy: N/A   3. Pain Management: Tylenol  1000 mg 3 times daily, gabapentin  300 mg 3 times daily, lidocaine  5% patch, Robaxin  500 mg 3 times daily.  PRN: Tylenol  650 mg and Oxycodone  5 mg    4. Mood/Behavior/Sleep: LCSW to follow for evaluation and support when available.              -antipsychotic agents: N/A   5. Neuropsych/cognition: This patient is capable of making decisions on his own behalf.   6. Skin/Wound Care: Routine pressure relief measures.    7. Fluids/Electrolytes/Nutrition: Monitor I&O and weight. Follow up labs CBC/CMP     8. Right hip septic arthritis/MSSA bactermia: Afebrile, blood cultures neg from  12/14.  Per ID continue IV cefazolin  course EOT 12/12/2024. PICC line to be placed in CIR 12/18.    9. Hypertension: Monitor BP per protocol. Continue losartan -hydrochlorothiazide  50-12.5mg  daily at home.    10.OSA: Continue CPAP nightly.    11. Hyponatremia: 12/17 NA 139, continue to monitor with labs.         LOS: 1 days A FACE TO FACE EVALUATION WAS PERFORMED  Prentice FORBES Compton 11/05/2024, 7:17 AM     "

## 2024-11-05 NOTE — Progress Notes (Signed)
 " Inpatient Rehabilitation Care Coordinator Assessment and Plan Patient Details  Name: Duane Greene MRN: [id number] Date of Birth: [birth year]  Today's Date: 11/05/2024  Hospital Problems: Principal Problem:   Septic arthritis (HCC) Active Problems:   MSSA bacteremia   Endocarditis of mitral valve  Past Medical History:  Past Medical History:  Diagnosis Date   Arthritis    History of kidney stones    Hyperlipidemia    Hypertension    Right inguinal hernia    Sleep apnea    uses CPAP nightly   Past Surgical History:  Past Surgical History:  Procedure Laterality Date   CHOLECYSTECTOMY N/A 06/13/2020   Procedure: LAPAROSCOPIC CHOLECYSTECTOMY;  Surgeon: Belinda Cough, MD;  Location: [facility] OR;  Service: General;  Laterality: N/A;   COLONOSCOPY     EXTRACORPOREAL SHOCK WAVE LITHOTRIPSY Left 10/18/2019   Procedure: EXTRACORPOREAL SHOCK WAVE LITHOTRIPSY (ESWL);  Surgeon: Renda Glance, MD;  Location: [facility] ORS;  Service: Urology;  Laterality: Left;   INCISION AND DRAINAGE OF WOUND Right 10/31/2024   Procedure: IRRIGATION AND DEBRIDEMENT WOUND;  Surgeon: Georgina Ozell LABOR, MD;  Location[facility] OR;  Service: Orthopedics;  Laterality: Right;   INGUINAL HERNIA REPAIR Right 06/30/2019   Procedure: RIGHT INGUINAL HERNIA REPAIR WITH MESH;  Surgeon: Belinda Cough, MD;  Location:  SURGERY CENTER;  Service: General;  Laterality: Right;   INTRAOPERATIVE CHOLANGIOGRAM N/A 06/13/2020   Procedure: INTRAOPERATIVE CHOLANGIOGRAM;  Surgeon: Belinda Cough, MD;  Location: [facility] OR;  Service: General;  Laterality: N/A;   KNEE ARTHROSCOPY Left    LUMBAR LAMINECTOMY/DECOMPRESSION MICRODISCECTOMY N/A 08/28/2020   Procedure: Laminectomy - Lumbar One-Two, Lumbar Two-Three, Lumbar Three-Four, Lumbar Four-Five, Lumbar Five-Sacral One;  Surgeon: Joshua Alm RAMAN, MD;  Location: [facility] OR;  Service: Neurosurgery;  Laterality: N/A;  posterior   REVERSE SHOULDER ARTHROPLASTY Right 09/06/2022   Procedure: REVERSE Right  SHOULDER ARTHROPLASTY;  Surgeon: Kay Kemps, MD;  Location: [facility] ORS;  Service: Orthopedics;  Laterality: Right;  120 hours choice and interscalene block   SHOULDER ARTHROSCOPY Bilateral    TONSILLECTOMY     TRANSESOPHAGEAL ECHOCARDIOGRAM (CATH LAB) N/A 11/03/2024   Procedure: TRANSESOPHAGEAL ECHOCARDIOGRAM;  Surgeon: Sheena Pugh, DO;  Location: [facility] INVASIVE CV LAB;  Service: Cardiovascular;  Laterality: N/A;   Social History:  reports that he has never smoked. He has never used smokeless tobacco. He reports that he does not drink alcohol and does not use drugs.  Family / Support Systems Marital Status: Married Patient Roles: Spouse, Other (Comment) (retiree/neighbor) Spouse/Significant Other: Consuelo [phone] Other Supports: neighbor and friend Anticipated Caregiver: wife Ability/Limitations of Caregiver: no limitations staying here with pt Caregiver Availability: 24/7 Family Dynamics: Close with wife and friends he hopes to do well here and make good progress in therapies before going home. Both feel they have good social supports  Social History Preferred language: English Religion: Christian Cultural Background: NA Education: Charity Fundraiser - How often do you need to have someone help you when you read instructions, pamphlets, or other written material from your doctor or pharmacy?: Never Writes: Yes Employment Status: Retired Marine Scientist Issues: No issues Guardian/Conservator: None-according to MD pt is capable of making his own decisions while here. Wife is staying here with him to provide support   Abuse/Neglect Abuse/Neglect Assessment Can Be Completed: Yes Physical Abuse: Denies Verbal Abuse: Denies Sexual Abuse: Denies Exploitation of patient/patient's resources: Denies Self-Neglect: Denies  Patient response to: Social Isolation - How often do you feel lonely or isolated from those around you?: Never  Emotional Status Pt's affect, behavior  and adjustment status: Pt is motivated to do well and recover as much as possible before going home. He was very active and was getting ready for a TKR and is aware will need to heal first from this infection. Wife is supportive and involved and here to assist Recent Psychosocial Issues: other health issues Psychiatric History: No history seems to be coping appropriately and expresses himself. Will ask team if would benefit from seeing neuro-psych while here Substance Abuse History: NA  Patient / Family Perceptions, Expectations & Goals Pt/Family understanding of illness & functional limitations: Pt and wife are able to explain his infection and decreased in function. Both talk with the MD's involved and feel understand his plan moving forward and are hopeful he will do well here. Premorbid pt/family roles/activities: husband, retiree, neighbor, friend, etc Anticipated changes in roles/activities/participation: resume Pt/family expectations/goals: Pt states:  I need to get stronger and up and moving.  Wife states:  I am here to help and see his progress.  Community [employer] Agencies: None Premorbid Home Care/DME Agencies: Other (Comment) (rw, cane, built in shower seat, bsc) Transportation available at discharge: both Is the patient able to respond to transportation needs?: Yes In the past 12 months, has lack of transportation kept you from medical appointments or from getting medications?: No In the past 12 months, has lack of transportation kept you from meetings, work, or from getting things needed for daily living?: No  Discharge Planning Living Arrangements: Spouse/significant other Support Systems: Spouse/significant other, Friends/neighbors, Psychologist, Clinical community Type of Residence: Private residence Insurance Resources: Media Planner (specify) (Health Team Advantage) Financial Resources: Social Security, Family Support Financial Screen Referred: No Living  Expenses: Own Money Management: Patient, Spouse Does the patient have any problems obtaining your medications?: No Home Management: both Patient/Family Preliminary Plans: Return home with wife who is staying here with him and observing in therapies and assisting if appropriate. Aware being evaluated today and goals being set for stay here today. Will work on discharge needs. Care Coordinator Barriers to Discharge: IV antibiotics, Insurance for SNF coverage Care Coordinator Anticipated Follow Up Needs: HH/OP  Clinical Impression Pleasant gentleman who is motivated to do well and recover as much as can and get back to his independent level. His wife has been staying with him while here and will assist at discharge. Will await team's evaluations and work on discharge needs.  Raymonde Asberry MATSU 11/05/2024, 10:30 AM    "

## 2024-11-05 NOTE — Discharge Instructions (Addendum)
 Inpatient Rehab Discharge Instructions  Duane Greene  Discharge date and time: 11/19/2024   Activities/Precautions/ Functional Status:  Activity: activity as tolerated and no lifting, driving, or strenuous exercise for until cleared by provider  Diet: regular diet  Wound Care: keep wound clean and dry and reinforce dressing PRN   Functional status:  ___ No restrictions     ___ Walk up steps independently ___ 24/7 supervision/assistance   ___ Walk up steps with assistance _X__ Intermittent supervision/assistance  ___ Bathe/dress independently ___ Walk with walker     ___ Bathe/dress with assistance ___ Walk Independently    ___ Shower independently X___ Walk with assistance    ___ Shower with assistance __X_ No alcohol     ___ Return to work/school ________  Special Instructions:    My questions have been answered and I understand these instructions. I will adhere to these goals and the provided educational materials after my discharge from the hospital.  Patient/Caregiver Signature _______________________________ Date __________  Clinician Signature _______________________________________ Date __________  Please bring this form and your medication list with you to all your follow-up doctor's appointments.      COMMUNITY REFERRALS UPON DISCHARGE:    Home Health:   PT    OT     RN                Agency:[facility]   [phone]    Medical Items Ordered:IV antibiotics                             Agency/Supplier:[facility] #([id number]) [phone]  Medical Equipment/Items Ordered: transport chair (HAS NEEDED EQUIPMENT FROM PAST SURGERIES AND WIFE ORDERED BED RAIL AND ELEVATED COMMODE)                                                 Agency/Supplier: [facility] [phone]

## 2024-11-05 NOTE — Progress Notes (Signed)
 Inpatient Rehabilitation  Patient information reviewed and entered into eRehab system by [facility]. Karen Kays., CCC/SLP, PPS Coordinator.  Information including medical coding, functional ability and quality indicators will be reviewed and updated through discharge.

## 2024-11-05 NOTE — Progress Notes (Signed)
 Patient ID: Duane Greene, male   DOB: 04-Apr-1951, 73 y.o.   MRN: [id number] Met with the patient and wife to review current medical situation, rehab process, team conference and plan of care. Reviewed DVT ppx and management of constipation and pain. Continue to follow along to address educational needs to facilitate preparation for discharge. Fredericka Barnie NOVAK

## 2024-11-05 NOTE — Progress Notes (Signed)
 Patient ID: Duane Greene, male   DOB: 1951/03/22, 73 y.o.   MRN: [id number] Have found a home health agency-[facility] to accept his home health referral for PT OT RN-IV antibiotics, will make sure acceptable with pt and wife. Pt reports tired but went well today in therapies.

## 2024-11-05 NOTE — Progress Notes (Signed)
 PICC Consult: Informed Olam, LPN PICC will not be placed tonight. She confirms PIV is working well. PICC team will assess on 12/20.

## 2024-11-05 NOTE — Evaluation (Signed)
 Occupational Therapy Assessment and Plan  Patient Details  Name: Duane Greene MRN: [id number] Date of Birth: Mar 18, 1951  OT Diagnosis: acute pain, muscle weakness (generalized), and pain in joint Rehab Potential: Rehab Potential (ACUTE ONLY): Good ELOS: 10-12 days   Today's Date: 11/05/2024 OT Individual Time: 8954-8792 OT Individual Time Calculation (min): 82 min     Pt seen for initial evaluation and ADL training with a focus on safe sit to stands to reduce pressure on R hip.  Pt practiced sit to stand techniques and taking several steps forward and back with RW to prepare for shower transfer.  Because pt feeling very fatigue, opted to do stand pivots in the bathroom vs ambulation.  Spent time getting DME for shower,  covering IV line, obtaining TED hose as pt had edema in R foot and his shoes had left red impressions on top of foot,   see below for ADL details.  Overall his is limited due to R hip pain with rising to stand, standing tall and reaching forward for LB self care.  Pt did put in excellent effort.   At end of session, had pt use non slip socks over TEDs as his shoes were tight.  Reviewed role of OT, discussed POC and pt's goals, and ELOS. Pt resting in w/c With all needs met and his wife was with him.     Hospital Problem: Principal Problem:   Septic arthritis (HCC) Active Problems:   MSSA bacteremia   Endocarditis of mitral valve   Past Medical History:  Past Medical History:  Diagnosis Date   Arthritis    History of kidney stones    Hyperlipidemia    Hypertension    Right inguinal hernia    Sleep apnea    uses CPAP nightly   Past Surgical History:  Past Surgical History:  Procedure Laterality Date   CHOLECYSTECTOMY N/A 06/13/2020   Procedure: LAPAROSCOPIC CHOLECYSTECTOMY;  Surgeon: Belinda Cough, MD;  Location: [facility] OR;  Service: General;  Laterality: N/A;   COLONOSCOPY     EXTRACORPOREAL SHOCK WAVE LITHOTRIPSY Left 10/18/2019   Procedure: EXTRACORPOREAL  SHOCK WAVE LITHOTRIPSY (ESWL);  Surgeon: Renda Glance, MD;  Location: [facility] ORS;  Service: Urology;  Laterality: Left;   INCISION AND DRAINAGE OF WOUND Right 10/31/2024   Procedure: IRRIGATION AND DEBRIDEMENT WOUND;  Surgeon: Georgina Ozell LABOR, MD;  Location: [facility] OR;  Service: Orthopedics;  Laterality: Right;   INGUINAL HERNIA REPAIR Right 06/30/2019   Procedure: RIGHT INGUINAL HERNIA REPAIR WITH MESH;  Surgeon: Belinda Cough, MD;  Location: [facility];  Service: General;  Laterality: Right;   INTRAOPERATIVE CHOLANGIOGRAM N/A 06/13/2020   Procedure: INTRAOPERATIVE CHOLANGIOGRAM;  Surgeon: Belinda Cough, MD;  Location: [facility] OR;  Service: General;  Laterality: N/A;   KNEE ARTHROSCOPY Left    LUMBAR LAMINECTOMY/DECOMPRESSION MICRODISCECTOMY N/A 08/28/2020   Procedure: Laminectomy - Lumbar One-Two, Lumbar Two-Three, Lumbar Three-Four, Lumbar Four-Five, Lumbar Five-Sacral One;  Surgeon: Joshua Alm RAMAN, MD;  Location: [facility] OR;  Service: Neurosurgery;  Laterality: N/A;  posterior   REVERSE SHOULDER ARTHROPLASTY Right 09/06/2022   Procedure: REVERSE Right SHOULDER ARTHROPLASTY;  Surgeon: Kay Kemps, MD;  Location: [facility] ORS;  Service: Orthopedics;  Laterality: Right;  120 hours choice and interscalene block   SHOULDER ARTHROSCOPY Bilateral    TONSILLECTOMY     TRANSESOPHAGEAL ECHOCARDIOGRAM (CATH LAB) N/A 11/03/2024   Procedure: TRANSESOPHAGEAL ECHOCARDIOGRAM;  Surgeon: Sheena Pugh, DO;  Location: [facility];  Service: Cardiovascular;  Laterality: N/A;    Assessment &  Plan Clinical Impression:   Duane Greene is a 73 year old male with PMHx of HTN, HLD, OSA on CPAP, pulmonary embolism no longer on anticoagulation, and OA bilateral hips followed by [facility] .  Patient presented to [facility] ED on 10/26/2024 with complaints of right hip and groin pain. He was s/p US  guided steroid injection 12/2.  In the ED xray consistent for OA, he was discharged after receiving IV Toradol   and instructed to use p.o. oxycodone  and to follow-up with Ortho. Dr. Delane evaluated with MRI for possible avascular necrosis.  At home he became febrile his pain was unbearable.  Patient returned to the ED on 12/13 with temp 100.4 and labs notable for WBC 12.9, lactic acid 2, and CRP 27.6.  UA with rare bacteria and WBC 0-5.  Blood cultures were obtained and showed S. Aureus.  Orthopedic surgery and ID consulted for admission and placed on IV cefazolin .    Patient underwent right hip aspiration on 12/13 in IR and subsequently underwent I&D of the hip joint on 12/14.  Intraoperative cultures showed gram-positive cocci.  ID recommended TTE that showed LVEF of 55 to 60% with no regional wall motion abnormality and no mention of vegetation.  Cardiology consulted, patient then underwent TEE on 11/04/2024 with vegetation on the mitral valve.  Blood culture from 10/31/2024 have been negative for 4 days. ID continues to follow for course of treatment.    Prior to arrival he was active and independent, driving in the community with use of DME. He lives with his spouse in a once level home with 3 stairs to enter. He currently requires moderate assistance, +2 for physical assistance, +2 for safety/equipment with ADLs  and bed mobility. Min assist to transfer with RW.  Therapy evaluations completed due to patient decreased functional mobility was admitted for a comprehensive rehab program.     Patient transferred to CIR on 11/04/2024 .    Patient currently requires max with lower body basic self-care skills secondary to muscle weakness, muscle joint tightness, and pain in joint and decreased standing balance and decreased balance strategies.  Prior to hospitalization, patient was fully independent.  Patient will benefit from skilled intervention to increase independence with basic self-care skills prior to discharge home with care partner.  Anticipate patient will require intermittent supervision and follow up  home health.  OT - End of Session Activity Tolerance: Tolerates 10 - 20 min activity with multiple rests Endurance Deficit: Yes OT Assessment Rehab Potential (ACUTE ONLY): Good OT Barriers to Discharge: None OT Patient demonstrates impairments in the following area(s): Balance;Edema;Endurance;Motor;Pain OT Basic ADL's Functional Problem(s): Bathing;Dressing;Toileting OT Advanced ADL's Functional Problem(s): None OT Transfers Functional Problem(s): Toilet;Tub/Shower OT Additional Impairment(s): None OT Plan OT Intensity: Minimum of 1-2 x/day, 45 to 90 minutes OT Frequency: 5 out of 7 days OT Duration/Estimated Length of Stay: 10-12 days OT Treatment/Interventions: Balance/vestibular training;Discharge planning;DME/adaptive equipment instruction;Functional mobility training;Pain management;Patient/family education;Psychosocial support;Therapeutic Activities;Therapeutic Exercise;Self Care/advanced ADL retraining;UE/LE Strength taining/ROM OT Self Feeding Anticipated Outcome(s): no goal, pt is independent OT Basic Self-Care Anticipated Outcome(s): CGA bathing, LB dressing OT Toileting Anticipated Outcome(s): CGA toileting OT Bathroom Transfers Anticipated Outcome(s): supervision OT Recommendation Recommendations for Other Services: None Patient destination: Home Follow Up Recommendations: Home health OT Equipment Recommended: To be determined   OT Evaluation Precautions/Restrictions  Precautions Precautions: Fall Restrictions Weight Bearing Restrictions Per Provider Order: Yes RLE Weight Bearing Per Provider Order: Weight bearing as tolerated    Pain Pain Assessment Pain Scale:  0-10 Pain Score: 6  Pain Type: Acute pain Pain Location: Hip Pain Orientation: Right Pain Onset: With Activity (initially 1/10 but w/ mobility 6/10) Pain Intervention(s): Medication (See eMAR) Home Living/Prior Functioning Home Living Family/patient expects to be discharged to:: Private  residence Living Arrangements: Spouse/significant other Available Help at Discharge: Family, Available 24 hours/day Type of Home: House Home Access: Stairs to enter Secretary/administrator of Steps: 3 Entrance Stairs-Rails: None (at front 4 STE w/ B rails and feels they are close enough to use simultaneously.) Home Layout: One level Bathroom Shower/Tub: Walk-in shower  Lives With: Spouse Prior Function Level of Independence: Independent with transfers, Independent with gait, Independent with basic ADLs  Able to Take Stairs?: Yes Driving: Yes Vocation: Retired Administrator, Sports Baseline Vision/History: 1 Wears glasses Ability to See in Adequate Light: 0 Adequate Patient Visual Report: No change from baseline Vision Assessment?: No apparent visual deficits Perception  Perception: Within Functional Limits Praxis Praxis: WFL Cognition Cognition Overall Cognitive Status: Within Functional Limits for tasks assessed Arousal/Alertness: Awake/alert Memory: Impaired Memory Impairment: Retrieval deficit Awareness: Appears intact Safety/Judgment: Appears intact Brief Interview for Mental Status (BIMS) Repetition of Three Words (First Attempt): 3 Temporal Orientation: Year: Correct Temporal Orientation: Month: Accurate within 5 days Temporal Orientation: Day: Correct Recall: Sock: Yes, no cue required Recall: Blue: Yes, no cue required Recall: Bed: Yes, no cue required BIMS Summary Score: 15 Sensation Sensation Light Touch: Appears Intact Hot/Cold: Appears Intact Proprioception: Appears Intact Coordination Gross Motor Movements are Fluid and Coordinated: No Heel Shin Test: unable Motor  Motor Motor - Skilled Clinical Observations: decreased RLE 2/2 pain  Trunk/Postural Assessment  Cervical Assessment Cervical Assessment: Within Functional Limits Thoracic Assessment Thoracic Assessment: Within Functional Limits Lumbar Assessment Lumbar Assessment: Exceptions to [facility] (post pelvic  tilt.) Postural Control Postural Control: Deficits on evaluation Righting Reactions: delayed.  Balance Balance Balance Assessed: Yes Static Sitting Balance Static Sitting - Balance Support: Feet supported Static Sitting - Level of Assistance: 5: Stand by assistance Dynamic Sitting Balance Dynamic Sitting - Balance Support: Feet supported Dynamic Sitting - Level of Assistance: 5: Stand by assistance;4: Min assist Static Standing Balance Static Standing - Balance Support: Bilateral upper extremity supported Static Standing - Level of Assistance: 4: Min assist Dynamic Standing Balance Dynamic Standing - Balance Support: Bilateral upper extremity supported Dynamic Standing - Level of Assistance: 4: Min assist Extremity/Trunk Assessment RUE Assessment Active Range of Motion (AROM) Comments: sh flexion to 150 due to shoulder replacement General Strength Comments: functional LUE Assessment Active Range of Motion (AROM) Comments: sh flexion to 100 - arthritis General Strength Comments: functional in elbow and hand  Care Tool Care Tool Self Care Eating   Eating Assist Level: Independent    Oral Care    Oral Care Assist Level: Independent    Bathing   Body parts bathed by patient: Right arm;Left arm;Chest;Abdomen;Front perineal area;Face;Left upper leg;Right upper leg Body parts bathed by helper: Right lower leg;Left lower leg;Buttocks   Assist Level: Moderate Assistance - Patient 50 - 74%    Upper Body Dressing(including orthotics)   What is the patient wearing?: Pull over shirt   Assist Level: Contact Guard/Touching assist    Lower Body Dressing (excluding footwear)   What is the patient wearing?: Underwear/pull up;Pants Assist for lower body dressing: Maximal Assistance - Patient 25 - 49%    Putting on/Taking off footwear   What is the patient wearing?: Ted hose;Non-skid slipper socks Assist for footwear: Dependent - Patient 0%       Care  Tool Toileting Toileting  activity   Assist for toileting: Moderate Assistance - Patient 50 - 74%     Care Tool Bed Mobility Roll left and right activity   Roll left and right assist level: Moderate Assistance - Patient 50 - 74%    Sit to lying activity        Lying to sitting on side of bed activity   Lying to sitting on side of bed assist level: the ability to move from lying on the back to sitting on the side of the bed with no back support.: Moderate Assistance - Patient 50 - 74%     Care Tool Transfers Sit to stand transfer   Sit to stand assist level: Moderate Assistance - Patient 50 - 74%    Chair/bed transfer   Chair/bed transfer assist level: Minimal Assistance - Patient > 75%     Toilet transfer   Assist Level: Minimal Assistance - Patient > 75%     Care Tool Cognition  Expression of Ideas and Wants Expression of Ideas and Wants: 4. Without difficulty (complex and basic) - expresses complex messages without difficulty and with speech that is clear and easy to understand  Understanding Verbal and Non-Verbal Content Understanding Verbal and Non-Verbal Content: 4. Understands (complex and basic) - clear comprehension without cues or repetitions   Memory/Recall Ability Memory/Recall Ability : Current season;Location of own room;That he or she is in a hospital/hospital unit;Staff names and faces   Refer to Care Plan for Long Term Goals  SHORT TERM GOAL WEEK 1 OT Short Term Goal 1 (Week 1): pt will be able to don pants over feet using reacher if needed with supervision. OT Short Term Goal 2 (Week 1): Pt will be able to rise to stand with min A to CGA to prepare for toileting needs. OT Short Term Goal 3 (Week 1): pt will be able to ambulate 20 ft from bed to toilet with RW with CGA.  Recommendations for other services: None    Skilled Therapeutic Intervention ADL ADL Eating: Independent Grooming: Independent Upper Body Bathing: Setup Where Assessed-Upper Body Bathing: Shower Lower Body  Bathing: Moderate assistance Where Assessed-Lower Body Bathing: Shower Upper Body Dressing: Minimal assistance Where Assessed-Upper Body Dressing: Wheelchair Lower Body Dressing: Maximal assistance Where Assessed-Lower Body Dressing: Wheelchair Toileting: Moderate assistance Where Assessed-Toileting: Teacher, Adult Education: Curator Method: Surveyor, Minerals: Grab bars;Raised Copy: Insurance Underwriter Method: Warden/ranger: Transfer tub bench;Grab bars Mobility  Bed Mobility Bed Mobility: Rolling Right;Supine to [employer] Right: Moderate Assistance - Patient 50-74% Supine to Sit: Moderate Assistance - Patient 50-74% Transfers Sit to Stand: Moderate Assistance - Patient 50-74% Stand to Sit: Moderate Assistance - Patient 50-74%   Discharge Criteria: Patient will be discharged from OT if patient refuses treatment 3 consecutive times without medical reason, if treatment goals not met, if there is a change in medical status, if patient makes no progress towards goals or if patient is discharged from hospital.  The above assessment, treatment plan, treatment alternatives and goals were discussed and mutually agreed upon: by patient and by family  Carold Eisner 11/05/2024, 12:49 PM

## 2024-11-05 NOTE — Progress Notes (Signed)
 Occupational Therapy Session Note  Patient Details  Name: Deamonte L Hickox MRN: [id number] Date of Birth: Jan 10, 1951  Today's Date: 11/05/2024 OT Individual Time: 1305-1400 OT Individual Time Calculation (min): 55 min    Short Term Goals: Week 1:  OT Short Term Goal 1 (Week 1): pt will be able to don pants over feet using reacher if needed with supervision. OT Short Term Goal 2 (Week 1): Pt will be able to rise to stand with min A to CGA to prepare for toileting needs. OT Short Term Goal 3 (Week 1): pt will be able to ambulate 20 ft from bed to toilet with RW with CGA.  Skilled Therapeutic Interventions/Progress Updates:  Pt greeted seated in w/c, pt agreeable to OT intervention.  Switched out R leg rest to an elevating leg rest with pt reporting improvements in pain.     Transfers/bed mobility/functional mobility:  Pt completed supine<>sit on mat table with pt needing MODA to transition from sitting>supine needing most assist to manage RLE. Pt needed increased assist when transitioning from supine>sitting needing assist to manage RLE and elevate trunk from flat surface.   Pt completed multiple sit>stands from EOM with needing to push from sitting surface with RUE with LUE on RW. Pt initially needed MODA but progressed to MIN A with increased reps.   Pt completed 33 ft of functional ambulation with RW, pt noted to have a step to pattern, relying a lot on UB strength when walking. MAX cues provided to allow more weight through BLE.    Exercises: pt completed below BLE therex from supine on mat to promote improved AROM in RLE for higher level ADL tasks: X10 hip adduction/abduction with towel placed under R foot to decrease friction X10 straight leg raises with MOD A needed to elevate RLE fully off of mat X10 hip/knee flexion/extension with towel placed under R foot to decrease friction  Pt completed above therex again but this time with assist from gait belt around R foot to promote Active  assist ROM, encouraged pt to complete same therex with gait belt in bed as able.                  Ended session with pt seated in w/c with all needs within reach and wife present.                Therapy Documentation Precautions:  Precautions Precautions: Fall Restrictions Weight Bearing Restrictions Per Provider Order: Yes RLE Weight Bearing Per Provider Order: Weight bearing as tolerated  Pain: 5/10 pain reported in R groin area, rest breaks and repositioning provided as needed.     Therapy/Group: Individual Therapy  Ronal Gift [facility] 11/05/2024, 2:07 PM

## 2024-11-05 NOTE — Evaluation (Signed)
 Physical Therapy Assessment and Plan  Patient Details  Name: Carter L Udall MRN: [id number] Date of Birth: 04/17/1951  PT Diagnosis: Abnormal posture, Abnormality of gait, Difficulty walking, Muscle weakness, and Pain in R hip Rehab Potential: Good ELOS: 10-12 days   Today's Date: 11/05/2024 PT Individual Time: 0845-1000 PT Individual Time Calculation (min): 75 min    Hospital Problem: Principal Problem:   Septic arthritis (HCC) Active Problems:   MSSA bacteremia   Endocarditis of mitral valve   Past Medical History:  Past Medical History:  Diagnosis Date   Arthritis    History of kidney stones    Hyperlipidemia    Hypertension    Right inguinal hernia    Sleep apnea    uses CPAP nightly   Past Surgical History:  Past Surgical History:  Procedure Laterality Date   CHOLECYSTECTOMY N/A 06/13/2020   Procedure: LAPAROSCOPIC CHOLECYSTECTOMY;  Surgeon: Belinda Cough, MD;  Location: [facility] OR;  Service: General;  Laterality: N/A;   COLONOSCOPY     EXTRACORPOREAL SHOCK WAVE LITHOTRIPSY Left 10/18/2019   Procedure: EXTRACORPOREAL SHOCK WAVE LITHOTRIPSY (ESWL);  Surgeon: Renda Glance, MD;  Location: [facility];  Service: Urology;  Laterality: Left;   INCISION AND DRAINAGE OF WOUND Right 10/31/2024   Procedure: IRRIGATION AND DEBRIDEMENT WOUND;  Surgeon: Georgina Ozell LABOR, MD;  Location: [facility] OR;  Service: Orthopedics;  Laterality: Right;   INGUINAL HERNIA REPAIR Right 06/30/2019   Procedure: RIGHT INGUINAL HERNIA REPAIR WITH MESH;  Surgeon: Belinda Cough, MD;  Location: [facility];  Service: General;  Laterality: Right;   INTRAOPERATIVE CHOLANGIOGRAM N/A 06/13/2020   Procedure: INTRAOPERATIVE CHOLANGIOGRAM;  Surgeon: Belinda Cough, MD;  Location: [facility] OR;  Service: General;  Laterality: N/A;   KNEE ARTHROSCOPY Left    LUMBAR LAMINECTOMY/DECOMPRESSION MICRODISCECTOMY N/A 08/28/2020   Procedure: Laminectomy - Lumbar One-Two, Lumbar Two-Three, Lumbar Three-Four, Lumbar  Four-Five, Lumbar Five-Sacral One;  Surgeon: Joshua Alm RAMAN, MD;  Location: [facility] OR;  Service: Neurosurgery;  Laterality: N/A;  posterior   REVERSE SHOULDER ARTHROPLASTY Right 09/06/2022   Procedure: REVERSE Right SHOULDER ARTHROPLASTY;  Surgeon: Kay Kemps, MD;  Location: [facility];  Service: Orthopedics;  Laterality: Right;  120 hours choice and interscalene block   SHOULDER ARTHROSCOPY Bilateral    TONSILLECTOMY     TRANSESOPHAGEAL ECHOCARDIOGRAM (CATH LAB) N/A 11/03/2024   Procedure: TRANSESOPHAGEAL ECHOCARDIOGRAM;  Surgeon: Sheena Pugh, DO;  Location: [facility];  Service: Cardiovascular;  Laterality: N/A;    Assessment & Plan Clinical Impression: Raymie L Mccaskey is a 73 year old male with PMHx of HTN, HLD, OSA on CPAP, pulmonary embolism no longer on anticoagulation, and OA bilateral hips followed by [facility] .  Patient presented to [facility] ED on 10/26/2024 with complaints of right hip and groin pain. He was s/p US  guided steroid injection 12/2.  In the ED xray consistent for OA, he was discharged after receiving IV Toradol  and instructed to use p.o. oxycodone  and to follow-up with Ortho. Dr. Delane evaluated with MRI for possible avascular necrosis.  At home he became febrile his pain was unbearable.  Patient returned to the ED on 12/13 with temp 100.4 and labs notable for WBC 12.9, lactic acid 2, and CRP 27.6.  UA with rare bacteria and WBC 0-5.  Blood cultures were obtained and showed S. Aureus.  Orthopedic surgery and ID consulted for admission and placed on IV cefazolin .    Patient underwent right hip aspiration on 12/13 in IR and subsequently underwent I&D of  the hip joint on 12/14.  Intraoperative cultures showed gram-positive cocci.  ID recommended TTE that showed LVEF of 55 to 60% with no regional wall motion abnormality and no mention of vegetation.  Cardiology consulted, patient then underwent TEE on 11/04/2024 with vegetation on the mitral valve.  Blood culture  from 10/31/2024 have been negative for 4 days. ID continues to follow for course of treatment.    Prior to arrival he was active and independent, driving in the community with use of DME. He lives with his spouse in a once level home with 3 stairs to enter. He currently requires moderate assistance, +2 for physical assistance, +2 for safety/equipment with ADLs  and bed mobility. Min assist to transfer with RW.  Therapy evaluations completed due to patient decreased functional mobility was admitted for a comprehensive rehab program.   Patient currently requires mod with mobility secondary to muscle weakness.  Prior to hospitalization, patient was independent  with mobility and lived with Spouse in a House home.  Home access is 3Stairs to enter.  Patient will benefit from skilled PT intervention to maximize safe functional mobility, minimize fall risk, and decrease caregiver burden for planned discharge home with 24 hour supervision.  Anticipate patient will benefit from follow up HH at discharge.  PT - End of Session Activity Tolerance: Tolerates 10 - 20 min activity with multiple rests Endurance Deficit: Yes PT Assessment Rehab Potential (ACUTE/IP ONLY): Good PT Barriers to Discharge: Inaccessible home environment PT Barriers to Discharge Comments: STE w/ and w/o rails. PT Patient demonstrates impairments in the following area(s): Balance;Pain;Endurance;Motor PT Transfers Functional Problem(s): Bed Mobility;Bed to Chair;Car;Furniture PT Locomotion Functional Problem(s): Ambulation;Wheelchair Mobility;Stairs PT Plan PT Intensity: Minimum of 1-2 x/day ,45 to 90 minutes PT Frequency: 5 out of 7 days PT Duration Estimated Length of Stay: 10-12 days PT Treatment/Interventions: Ambulation/gait training;Community reintegration;Neuromuscular re-education;Stair training;UE/LE Strength taining/ROM;Wheelchair propulsion/positioning;UE/LE Coordination activities;Therapeutic Activities;Pain  management;Discharge planning;Functional mobility training;Patient/family education;Therapeutic Exercise PT Transfers Anticipated Outcome(s): supervision PT Locomotion Anticipated Outcome(s): supervision PT Recommendation Recommendations for Other Services: None Follow Up Recommendations: Home health PT Patient destination: Home Equipment Recommended: To be determined Equipment Details: w/c w/ cushion possibly.   PT Evaluation Precautions/Restrictions Precautions Precautions: Fall Restrictions Weight Bearing Restrictions Per Provider Order: Yes RLE Weight Bearing Per Provider Order: Weight bearing as tolerated General   Vital Signs  Pain Pain Assessment Pain Scale: 0-10 Pain Score: 6  Pain Type: Acute pain Pain Location: Hip Pain Orientation: Right Pain Onset: With Activity (initially 1/10 but w/ mobility 6/10) Pain Intervention(s): Medication (See eMAR) Pain Interference Pain Interference Pain Effect on Sleep: 1. Rarely or not at all Pain Interference with Therapy Activities: 1. Rarely or not at all Pain Interference with Day-to-Day Activities: 1. Rarely or not at all Home Living/Prior Functioning Home Living Living Arrangements: Spouse/significant other Available Help at Discharge: Family;Available 24 hours/day Type of Home: House Home Access: Stairs to enter [employer] of Steps: 3 Entrance Stairs-Rails: None (at front 4 STE w/ B rails and feels they are close enough to use simultaneously.) Home Layout: One level Bathroom Shower/Tub: Walk-in shower  Lives With: Spouse Prior Function Level of Independence: Independent with transfers;Independent with gait  Able to Take Stairs?: Yes Vision/Perception     Cognition Overall Cognitive Status: Within Functional Limits for tasks assessed Arousal/Alertness: Awake/alert Memory: Impaired Memory Impairment: Retrieval deficit Awareness: Appears intact Safety/Judgment: Appears  intact Sensation Sensation Light Touch: Appears Intact Coordination Gross Motor Movements are Fluid and Coordinated: No Heel Shin Test: unable Motor  Motor Motor - Skilled Clinical Observations: decreased RLE 2/2 pain   Trunk/Postural Assessment  Cervical Assessment Cervical Assessment: Within Functional Limits Thoracic Assessment Thoracic Assessment: Within Functional Limits Lumbar Assessment Lumbar Assessment: Exceptions to [facility] (post pelvic tilt.) Postural Control Postural Control: Deficits on evaluation Righting Reactions: delayed.  Balance Balance Balance Assessed: Yes Static Sitting Balance Static Sitting - Balance Support: Feet supported Static Sitting - Level of Assistance: 5: Stand by assistance Dynamic Sitting Balance Dynamic Sitting - Balance Support: Feet supported Dynamic Sitting - Level of Assistance: 5: Stand by assistance;4: Min assist Static Standing Balance Static Standing - Balance Support: Bilateral upper extremity supported Static Standing - Level of Assistance: 4: Min assist Dynamic Standing Balance Dynamic Standing - Balance Support: Bilateral upper extremity supported Dynamic Standing - Level of Assistance: 4: Min assist Extremity Assessment      RLE Assessment RLE Assessment: Exceptions to [facility] RLE Strength RLE Overall Strength: Due to pain RLE Overall Strength Comments: grossly 3/5 knee, 2-/5 hip w/ c/o pain. LLE Assessment LLE Assessment: Within Functional Limits  Care Tool Care Tool Bed Mobility Roll left and right activity   Roll left and right assist level: Moderate Assistance - Patient 50 - 74%    Sit to lying activity        Lying to sitting on side of bed activity   Lying to sitting on side of bed assist level: the ability to move from lying on the back to sitting on the side of the bed with no back support.: Moderate Assistance - Patient 50 - 74%     Care Tool Transfers Sit to stand transfer   Sit to stand assist level:  Moderate Assistance - Patient 50 - 74%    Chair/bed transfer   Chair/bed transfer assist level: Minimal Assistance - Patient > 75%    Car transfer   Car transfer assist level: Maximal Assistance - Patient 25 - 49%      Care Tool Locomotion Ambulation   Assist level: Minimal Assistance - Patient > 75% Assistive device: Walker-rolling Max distance: 15  Walk 10 feet activity   Assist level: Minimal Assistance - Patient > 75% Assistive device: Walker-rolling   Walk 50 feet with 2 turns activity Walk 50 feet with 2 turns activity did not occur: Safety/medical concerns      Walk 150 feet activity Walk 150 feet activity did not occur: Safety/medical concerns      Walk 10 feet on uneven surfaces activity Walk 10 feet on uneven surfaces activity did not occur: Safety/medical concerns      Stairs Stair activity did not occur: Safety/medical concerns        Walk up/down 1 step activity Walk up/down 1 step or curb (drop down) activity did not occur: Safety/medical concerns      Walk up/down 4 steps activity Walk up/down 4 steps activity did not occur: Safety/medical concerns      Walk up/down 12 steps activity Walk up/down 12 steps activity did not occur: Safety/medical concerns      Pick up small objects from floor Pick up small object from the floor (from standing position) activity did not occur: Safety/medical concerns      Wheelchair Is the patient using a wheelchair?: Yes Type of Wheelchair: Manual   Wheelchair assist level: Supervision/Verbal cueing Max wheelchair distance: 40  Wheel 50 feet with 2 turns activity   Assist Level: Minimal Assistance - Patient > 75%  Wheel 150 feet activity   Assist Level: Dependent - Patient 0%  Refer to Care Plan for Long Term Goals  SHORT TERM GOAL WEEK 1 PT Short Term Goal 1 (Week 1): Pt will perform bed mobility w/ min A on flat bed. PT Short Term Goal 2 (Week 1): Pt will transfer sit to stand w/ min A consistently. PT Short  Term Goal 3 (Week 1): Pt will amb x 50' w/ RW and CGA PT Short Term Goal 4 (Week 1): PT to assess stairs.  Recommendations for other services: None   Skilled Therapeutic Intervention Evaluation completed (see details above and below) with education on PT POC and goals and individual treatment initiated with focus on  endurance, transfers, strengthening, gait, and stairs.  Pt presents supine in bed and agreeable to therapy.  Pt states pain of 1/10 to R hip, but increases to 6/10 w/ activity during session, pt given pain meds prior to session and rest breaks given for pain management.  Pt requires heavy mod A for sup to sit w/ cues for bridging to EOB and then completing scoot to side.  Pt transfers sit to stand w/ light mod A from elevated bed and cues for hand placement and breathing technique.  Pt amb w/ min A and RW to BR w/ cues for posture and step length/foot clearance.  Pt incontinent of bladder in brief and then continent of bowel and bladder in toilet, charted in Flowsheets.  Pt requires mod A for clothing management, total A for pericare.  Pt amb to w/c.  Pt negotiated w/c x 40' w/ supervision BUES.  Pt amb to SUV height car simulation w/ min A and then max A in/out of car for RLE and cues.  Pt amb to w/c w/ min A and improved reciprocal gait and posture w/ cues.  Pt remained sitting in w/c w/ chair alarm on and all needs in reach, spouse present.    Mobility Bed Mobility Bed Mobility: Rolling Right;Supine to Sit Rolling Right: Moderate Assistance - Patient 50-74% Supine to Sit: Moderate Assistance - Patient 50-74% Transfers Transfers: Sit to Stand;Stand to Sit;Stand Pivot Transfers Sit to Stand: Moderate Assistance - Patient 50-74% Stand to Sit: Moderate Assistance - Patient 50-74% Stand Pivot Transfers: Minimal Assistance - Patient > 75% Stand Pivot Transfer Details: Verbal cues for safe use of DME/AE;Verbal cues for sequencing Transfer (Assistive device): Rolling walker Locomotion   Gait Ambulation: Yes Gait Assistance: Minimal Assistance - Patient > 75% Gait Distance (Feet): 15 Feet Assistive device: Rolling walker Gait Assistance Details: Verbal cues for gait pattern;Verbal cues for safe use of DME/AE;Verbal cues for sequencing Gait Assistance Details: verbal cues for posture. Gait Gait: Yes Gait Pattern: Step-to pattern;Step-through pattern;Trunk flexed;Decreased dorsiflexion - right;Decreased stance time - right;Decreased step length - left Gait velocity: decreased Stairs / Additional Locomotion Stairs: No Wheelchair Mobility Wheelchair Mobility: Yes Wheelchair Assistance: Doctor, General Practice: Both upper extremities Wheelchair Parts Management: Needs assistance Distance: 40 before fatigue.   Discharge Criteria: Patient will be discharged from PT if patient refuses treatment 3 consecutive times without medical reason, if treatment goals not met, if there is a change in medical status, if patient makes no progress towards goals or if patient is discharged from hospital.  The above assessment, treatment plan, treatment alternatives and goals were discussed and mutually agreed upon: by patient and by family  Reyes SHAUNNA Sierra 11/05/2024, 12:16 PM

## 2024-11-05 NOTE — Progress Notes (Signed)
 [facility]patient Rehabilitation Center Individual Statement of Services  Patient Name:  Thoams L Gearin  Date:  11/05/2024  Welcome to the Inpatient Rehabilitation Center.  Our goal is to provide you with an individualized program based on your diagnosis and situation, designed to meet your specific needs.  With this comprehensive rehabilitation program, you will be expected to participate in at least 3 hours of rehabilitation therapies Monday-Friday, with modified therapy programming on the weekends.  Your rehabilitation program will include the following services:  Physical Therapy (PT), Occupational Therapy (OT), 24 hour per day rehabilitation nursing, Therapeutic Recreaction (TR), Care Coordinator, Rehabilitation Medicine, Nutrition Services, and Pharmacy Services  Weekly team conferences will be held on Wednesday to discuss your progress.  Your Inpatient Rehabilitation Care Coordinator will talk with you frequently to get your input and to update you on team discussions.  Team conferences with you and your family in attendance may also be held.  Expected length of stay: 10-12 days  Overall anticipated outcome: supervision/CGA level  Depending on your progress and recovery, your program may change. Your Inpatient Rehabilitation Care Coordinator will coordinate services and will keep you informed of any changes. Your Inpatient Rehabilitation Care Coordinator's name and contact numbers are listed  below.  The following services may also be recommended but are not provided by the Inpatient Rehabilitation Center:  Driving Evaluations Home Health Rehabiltiation Services Outpatient Rehabilitation Services    Arrangements will be made to provide these services after discharge if needed.  Arrangements include referral to agencies that provide these services.  Your insurance has been verified to be:  [employer] Your primary doctor is:  Dorn Sauers  Pertinent information will be shared  with your doctor and your insurance company.  Inpatient Rehabilitation Care Coordinator:  Rhoda Clement, KEN [phone] or ELIGAH BASQUES  Information discussed with and copy given to patient by: Clement Asberry MATSU, 11/05/2024, 10:38 AM

## 2024-11-06 DIAGNOSIS — R7401 Elevation of levels of liver transaminase levels: Secondary | ICD-10-CM

## 2024-11-06 DIAGNOSIS — I10 Essential (primary) hypertension: Secondary | ICD-10-CM

## 2024-11-06 LAB — URINALYSIS, ROUTINE W REFLEX MICROSCOPIC
Glucose, UA: NEGATIVE mg/dL
Hgb urine dipstick: NEGATIVE
Ketones, ur: NEGATIVE mg/dL
Leukocytes,Ua: NEGATIVE
Nitrite: NEGATIVE
Protein, ur: NEGATIVE mg/dL
Specific Gravity, Urine: >1.03 — ABNORMAL HIGH (ref 1.005–1.030)
pH: 5.5 (ref 5.0–8.0)

## 2024-11-06 MED ORDER — ACETAMINOPHEN 500 MG PO TABS
500.0000 mg | ORAL_TABLET | Freq: Two times a day (BID) | ORAL | Status: DC | PRN
  Administered 2024-11-06 – 2024-11-14 (×6): 500 mg via ORAL
  Filled 2024-11-06 (×7): qty 1

## 2024-11-06 MED ORDER — SODIUM CHLORIDE 0.9% FLUSH: 10.0000 mL | INTRAVENOUS | Status: DC | PRN

## 2024-11-06 NOTE — Progress Notes (Signed)
 Peripherally Inserted Central Catheter Placement  The IV Nurse has discussed with the patient and/or persons authorized to consent for the patient, the purpose of this procedure and the potential benefits and risks involved with this procedure.  The benefits include less needle sticks, lab draws from the catheter, and the patient may be discharged home with the catheter. Risks include, but not limited to, infection, bleeding, blood clot (thrombus formation), and puncture of an artery; nerve damage and irregular heartbeat and possibility to perform a PICC exchange if needed/ordered by physician.  Alternatives to this procedure were also discussed.  Bard Power PICC patient education guide, fact sheet on infection prevention and patient information card has been provided to patient /or left at bedside.    PICC Placement Documentation  PICC Single Lumen 11/06/24 Right Brachial 36 cm 0 cm (Active)  Indication for Insertion or Continuance of Line Home intravenous therapies (PICC only) 11/06/24 1001  Exposed Catheter (cm) 0 cm 11/06/24 1001  Site Assessment Clean, Dry, Intact 11/06/24 1001  Line Status Saline locked;Flushed;Blood return noted 11/06/24 1001  Dressing Type Transparent;Securing device;Antimicrobial dressing 11/06/24 1001  Dressing Status Antimicrobial disc/dressing in place;Clean, Dry, Intact 11/06/24 1001  Line Care Tubing changed;Connections checked and tightened 11/06/24 1001  Line Adjustment (NICU/IV Team Only) No 11/06/24 1001  Dressing Intervention New dressing;Adhesive placed at insertion site (IV team only);Adhesive placed around edges of dressing (IV team/ICU RN only) 11/06/24 1001  Dressing Change Due 11/13/24 11/06/24 1001       Duane Greene 11/06/2024, 10:02 AM

## 2024-11-06 NOTE — Progress Notes (Signed)
 "                                                        PROGRESS NOTE   Subjective/Complaints:  Pt doing well, slept ok until around 3am. Pain well managed overall. LBM yesterday. Urinating fine though he mentions having urinary urgency and dribbling which began not long ago, U/A was checked previously and he said it didn't show a UTI but he's agreeable with rechecking. No other complaints or concerns.   ROS- neg CP, SOB, abd pain, N/V/D/C. See HPI.   Objective:   US  EKG SITE RITE Result Date: 11/04/2024 If Site Rite image not attached, placement could not be confirmed due to current cardiac rhythm.  Recent Labs    11/05/24 0556  WBC 10.4  HGB 12.5*  HCT 36.6*  PLT 304   Recent Labs    11/05/24 0556  NA 136  K 4.2  CL 103  CO2 24  GLUCOSE 107*  BUN 24*  CREATININE 0.61  CALCIUM 8.5*    Intake/Output Summary (Last 24 hours) at 11/06/2024 1009 Last data filed at 11/06/2024 9277 Gross per 24 hour  Intake 480 ml  Output --  Net 480 ml         Physical Exam: Vital Signs Blood pressure (!) 127/57, pulse 63, temperature 98.6 F (37 C), temperature source Oral, resp. rate 16, height 5' 7 (1.702 m), weight 91.8 kg, SpO2 98%.  Constitutional: No distress . Vital signs reviewed. Sitting in w/c working with PT HEENT: NCAT, EOMI, oral membranes moist Neck: supple Cardiovascular: RRR without m/r/g appreciated. No JVD    Respiratory/Chest: CTA Bilaterally without wheezes or rales. Normal effort    GI/Abdomen: soft, +BS throughout, non-tender, non-distended Ext: no clubbing, cyanosis, or edema, TEDs donned Psych: pleasant and cooperative Skin: dry, warm   Assessment/Plan: 1. Functional deficits which require 3+ hours per day of interdisciplinary therapy in a comprehensive inpatient rehab setting. Physiatrist is providing close team supervision and 24 hour management of active medical problems listed below. Physiatrist and rehab team continue to assess barriers to  discharge/monitor patient progress toward functional and medical goals  Care Tool:  Bathing    Body parts bathed by patient: Right arm, Left arm, Chest, Abdomen, Front perineal area, Face, Left upper leg, Right upper leg   Body parts bathed by helper: Right lower leg, Left lower leg, Buttocks     Bathing assist Assist Level: Moderate Assistance - Patient 50 - 74%     Upper Body Dressing/Undressing Upper body dressing   What is the patient wearing?: Pull over shirt    Upper body assist Assist Level: Contact Guard/Touching assist    Lower Body Dressing/Undressing Lower body dressing      What is the patient wearing?: Underwear/pull up, Pants     Lower body assist Assist for lower body dressing: Maximal Assistance - Patient 25 - 49%     Toileting Toileting    Toileting assist Assist for toileting: Moderate Assistance - Patient 50 - 74%     Transfers Chair/bed transfer  Transfers assist     Chair/bed transfer assist level: Minimal Assistance - Patient > 75%     Locomotion Ambulation   Ambulation assist      Assist level: Minimal Assistance - Patient > 75% Assistive device: Walker-rolling Max distance: 15  Walk 10 feet activity   Assist     Assist level: Minimal Assistance - Patient > 75% Assistive device: Walker-rolling   Walk 50 feet activity   Assist Walk 50 feet with 2 turns activity did not occur: Safety/medical concerns         Walk 150 feet activity   Assist Walk 150 feet activity did not occur: Safety/medical concerns         Walk 10 feet on uneven surface  activity   Assist Walk 10 feet on uneven surfaces activity did not occur: Safety/medical concerns         Wheelchair     Assist Is the patient using a wheelchair?: Yes Type of Wheelchair: Manual    Wheelchair assist level: Supervision/Verbal cueing Max wheelchair distance: 40    Wheelchair 50 feet with 2 turns activity    Assist        Assist  Level: Minimal Assistance - Patient > 75%   Wheelchair 150 feet activity     Assist      Assist Level: Dependent - Patient 0%   Blood pressure (!) 127/57, pulse 63, temperature 98.6 F (37 C), temperature source Oral, resp. rate 16, height 5' 7 (1.702 m), weight 91.8 kg, SpO2 98%.  Medical Problem List and Plan: 1. Functional deficits secondary to R hip septic arthritis s/p I&D 12/14              -patient may shower with surgical sites covered             -ELOS/Goals: 10-14 days, SPV             -Continue CIR   2.  Antithrombotics: -DVT/anticoagulation:  Mechanical: Sequential compression devices, below knee Bilateral lower extremities Pharmaceutical: Lovenox  40mg  daily             -antiplatelet therapy: N/A   3. Pain Management: Tylenol  1000 mg 3 times daily, gabapentin  300 mg 3 times daily, lidocaine  5% patch, Robaxin  500 mg 3 times daily.  PRN: Tylenol  650 mg and Oxycodone  5 mg q4h prn-- changed tylenol  to 500mg  BID PRN to avoid overdosing.    4. Mood/Behavior/Sleep: LCSW to follow for evaluation and support when available.              -antipsychotic agents: N/A  -trazodone  PRN   5. Neuropsych/cognition: This patient is capable of making decisions on his own behalf.   6. Skin/Wound Care: Routine pressure relief measures. Terbinafine  250mg  weekly   7. Fluids/Electrolytes/Nutrition: Monitor I&O and weight. Follow up labs. Continue vitamins/supplements.  -11/06/24 mild transaminitis at admission here, AST 86/ALT 64, Alk phos 216-- monitor, LFTs added on for Monday. Could be from terbinafine ?   8. Right hip septic arthritis/MSSA bactermia: Afebrile, blood cultures neg from 12/14.  Per ID continue IV cefazolin  course EOT 12/12/2024. PICC line to be placed in CIR 12/18.    9. Hypertension: Monitor BP per protocol. Continue losartan -hydrochlorothiazide  50-12.5mg  daily at home.   -11/06/24 BPs great, monitor Vitals:   11/04/24 1410 11/04/24 1958 11/05/24 0450 11/05/24 1403   BP: (!) 131/51 136/61 (!) 119/59 (!) 111/52   11/05/24 2015 11/06/24 0311  BP: (!) 119/58 (!) 127/57      10.OSA: Continue CPAP nightly.    11. Hyponatremia: 12/17 NA 139, continue to monitor with labs. Resolved 12/19, 136   12. HLD: Simvastatin  40mg  nightly  13. Bowel management: colace 100mg  daily, miralax  BID, senokot 2 tabs BID  -11/06/24 LBM yesterday, cont regimen  14. Urinary  urgency/dribbling: -11/06/24 has been going on for at least a week or so, U/A's done last week with rare bacteria and no leuks/nitrites; will repeat U/A today but could just be prostate related. No retention. Monitor and await U/A  LOS: 2 days A FACE TO FACE EVALUATION WAS PERFORMED  [address] 11/06/2024, 10:09 AM     "

## 2024-11-06 NOTE — Progress Notes (Signed)
 Orthopedic Surgery Progress Note   Assessment: Patient is a 73 y.o. male with right hip septic arthritis, right gluteus musculature myositis   S/p incision and drainage of right hip   Plan: -Orthopedic operative plans complete -Intraoperative culture showing MSSA  -On ancef  for MSSA per ID recommendations -Okay for diet from orthopedic perspective -DVT ppx: per primary -Weight bearing status: as tolerated  -PT evaluate and treat -Pain control -Dispo: per primary  ___________________________________________________________________________  Subjective: No acute events overnight. Pain has been getting better. Not having groin pain anywhere. Having pain in the right buttock mostly. Has been able to mobilize and walk the halls. This is the first time he has walked the halls since pain got worse about a week prior to admission to the hospital.   WBC downtrending, now wnl   Physical Exam:  General: no acute distress, appears stated age Neurologic: alert, answering questions appropriately, following commands Respiratory: unlabored breathing on room air, symmetric chest rise Psychiatric: appropriate affect, normal cadence to speech  MSK:    -Right lower extremity  Tolerance for passive range of motion improved since prior exam  Weak hip flexion EHL/TA/GSC intact Plantarflexes and dorsiflexes toes Sensation intact to light touch in sural, saphenous, tibial, deep peroneal, and superficial peroneal nerve distributions Foot warm and well perfused   Yesterday's total administered Morphine  Milligram Equivalents: 15   Patient name: Duane Greene Patient MRN: [id number] Date: 11/06/2024

## 2024-11-06 NOTE — Progress Notes (Signed)
 Occupational Therapy Session Note  Patient Details  Name: Duane Greene MRN: [id number] Date of Birth: 01-18-1951  Session 1 Today's Date: 11/06/2024 OT Individual Time: 8982-8884 OT Individual Time Calculation (min): 58 min  Session 2 Today's Date: 11/06/2024 OT Individual Time: 1420-1534 OT Individual Time Calculation (min): 74 min   Short Term Goals: Week 1:  OT Short Term Goal 1 (Week 1): pt will be able to don pants over feet using reacher if needed with supervision. OT Short Term Goal 2 (Week 1): Pt will be able to rise to stand with min A to CGA to prepare for toileting needs. OT Short Term Goal 3 (Week 1): pt will be able to ambulate 20 ft from bed to toilet with RW with CGA.  Skilled Therapeutic Interventions/Progress Updates:  Session 1: Skilled OT session completed to address ADL retraining and STS in support of toileting. Pt received supine in bed, agreeable to participate in therapy. Pt reports 6/10 pain in R LE, OT provided pain intervention by repositioning and ice.   Supine>EOB with Mod A to manage BLE. Pt requesting to void, STS completed with Max A to reach standing. Pt completed functional mobility to toilet with Min A to manage RW. Pt completed toileting hygiene with Mod A to manage clothing and complete posterior peri care. OT also holding urinal for urine collection per RN. STS with CGA pt using grab bars and BSC for BUE support to reach standing. Pt completes functional mobility back to bed with Min A to manage RW. Seated EOB pt completed 5x STS at RW to increase standing tolerance in support of ADL participation. Min A-Mod A to support trunk. OT providing nose over toes cues to reach standing and encourage proper body mechanics with stand.OT demo'd hip mobilization in standing in all planes of movement, pt return demo'd with CGA standing at RW. EOB>Supine with Mod A to manage BLE. Pt reporting pain at RLE, pain intervention listed above.  RN notified of ice pack. Pt  supine in bed with bed alarm on and all needs within reach.   Session 2: Skilled OT session completed to address functional activity tolerance and core strengthening. Pt received supine in bed, agreeable to participate in therapy. Pt reports no pain; upon arrival; however, during activity 6/10 pain in RLE at hip and groin. RN notified of new groin pain, OT provided pain intervention by repositioning, providing rest breaks, and with ice.   Pt reporting good response with ice from AM session. Pt completed Supine>EOB with Max A to support BLE and trunk. Pt displaying posterior and L lateral lean throughout session, increased VC and tactile cues to lean forward and engage core. Pt completed 3x10 modified crunches with phsysioball, wife providing support at physioball, VC required to decrease L lateral lean. Pt required 2 seated rest breaks during task. Pt attempted 8x STS, pt able to reach complete stand 3x with Max A, reporting increased fatigue and pain. OT providing rest breaks throughout. Pt completed 10x bottom raises with Mod A to increase BLE strength and improve body mechanics when standing. Pt required VC to keep feet at shoulder width apart to increase base of support. Pt completed R lateral scoot to reach [facility], VC required to scoot bottom towards back of bed to reduce risk for falls. EOB>supine Mod A to support BLE. Pt completed bed level toileting with Max A to manage clothing, pt rolling R>L with Mod A, L>R with Min A. OT provided ice at groin. Pt supine in bed  with bed alarm on and all needs within reach.  Therapy Documentation Precautions:  Precautions Precautions: Fall Restrictions Weight Bearing Restrictions Per Provider Order: Yes RLE Weight Bearing Per Provider Order: Weight bearing as tolerated   Therapy/Group: Individual Therapy  Duane Waltner Woods-Chance, MS, OTR/L 11/06/2024, 7:43 AM

## 2024-11-06 NOTE — Progress Notes (Signed)
" °   11/06/24 2334  BiPAP/CPAP/SIPAP  $ Non-Invasive Home Ventilator  Initial  BiPAP/CPAP/SIPAP Pt Type Adult  BiPAP/CPAP/SIPAP Resmed  Mask Type Nasal mask  Dentures removed? Not applicable  Mask Size Large  Respiratory Rate 16 breaths/min  EPAP 8 cmH2O  FiO2 (%) 21 %  Patient Home Machine No  Patient Home Mask No  Patient Home Tubing No  Auto Titrate No  BiPAP/CPAP /SiPAP Vitals  Pulse Rate 76  Resp 16  SpO2 98 %  MEWS Score/Color  MEWS Score 0  MEWS Score Color Green    "

## 2024-11-06 NOTE — Progress Notes (Signed)
 Physical Therapy Session Note  Patient Details  Name: Duane Greene MRN: [id number] Date of Birth: 17-Aug-1951  Today's Date: 11/06/2024 PT Individual Time: 0808-0903 PT Individual Time Calculation (min): 55 min   Short Term Goals: Week 1:  PT Short Term Goal 1 (Week 1): Pt will perform bed mobility w/ min A on flat bed. PT Short Term Goal 2 (Week 1): Pt will transfer sit to stand w/ min A consistently. PT Short Term Goal 3 (Week 1): Pt will amb x 50' w/ RW and CGA PT Short Term Goal 4 (Week 1): PT to assess stairs.  Skilled Therapeutic Interventions/Progress Updates: Patient semi-reclined in bed following visit with physician on entrance to room. Patient alert and agreeable to PT session.   Patient reported unrated pain on R hip and has been medicated prior to arrival. Beginning of session focused on building pt and pt wife rapport.  PTA encouraged pt to perform seated marches on R LE and LAQ to further increase strength and tolerance to ROM. PTA also educated pt and pt wife on staying stationary in recliner as previously described prior to admission and how this leads to flexion contractures.   Therapeutic Activity: Bed Mobility: Pt performed supine<sit on EOB with maxA and cues for hand placement to elevate trunk and to scoot to EOB (HOB elevated).  Transfers: Pt performed sit<>stand transfers throughout session with RW and with modA at first that progressed to minA following cue adherence for hand placement and anterior weight shift.  Pt sat EOB while pt wife donned personal pants and grip socks (PTA donned B TED hose's with education provided for pt and pt wife to avoid wrinkles as this increases risk of tourniquet effect). Nsg arrived to detach IV briefly so pt can doff/donn personal shirt with supervision).  Pt transported to main gym and ambulated 34' x 1, and 22' x 1 with WC follow and pt requiring seated rest break. Pt required VC to increase step length of L LE as pt presented with  antalgic gait pattern on R (education provided to rationale). Pt also cued to increase upright standing posture (pt wife reported this was baseline) and to look forward ahead vs at ground.    Patient sitting in WC at end of session with brakes locked, wife present and all needs within reach.      Therapy Documentation Precautions:  Precautions Precautions: Fall Restrictions Weight Bearing Restrictions Per Provider Order: Yes RLE Weight Bearing Per Provider Order: Weight bearing as tolerated  Therapy/Group: Individual Therapy  Annibelle Brazie PTA 11/06/2024, 11:32 AM

## 2024-11-07 DIAGNOSIS — R195 Other fecal abnormalities: Secondary | ICD-10-CM

## 2024-11-07 MED ORDER — SENNOSIDES-DOCUSATE SODIUM 8.6-50 MG PO TABS: 2.0000 | ORAL_TABLET | Freq: Two times a day (BID) | ORAL | Status: DC | PRN

## 2024-11-07 MED ORDER — DIPHENHYDRAMINE HCL 25 MG PO CAPS
25.0000 mg | ORAL_CAPSULE | Freq: Four times a day (QID) | ORAL | Status: DC | PRN
  Administered 2024-11-07 – 2024-11-10 (×4): 25 mg via ORAL
  Filled 2024-11-07 (×4): qty 1

## 2024-11-07 MED ORDER — POLYETHYLENE GLYCOL 3350 17 G PO PACK: 17.0000 g | PACK | Freq: Every day | ORAL | Status: DC | PRN

## 2024-11-07 MED ORDER — TRAZODONE HCL 50 MG PO TABS: 25.0000 mg | ORAL_TABLET | Freq: Every evening | ORAL | Status: DC | PRN

## 2024-11-07 MED ORDER — SODIUM CHLORIDE 0.9 % IV SOLN: INTRAVENOUS | Status: AC | PRN

## 2024-11-07 NOTE — Progress Notes (Signed)
 Physical Therapy Session Note  Patient Details  Name: Duane Greene MRN: [id number] Date of Birth: October 12, 1951  Today's Date: 11/07/2024 PT Individual Time: 1445-1525  PT Individual Time Calculation (min): 40 min  Short Term Goals: Week 1:  PT Short Term Goal 1 (Week 1): Pt will perform bed mobility w/ min A on flat bed. PT Short Term Goal 2 (Week 1): Pt will transfer sit to stand w/ min A consistently. PT Short Term Goal 3 (Week 1): Pt will amb x 50' w/ RW and CGA PT Short Term Goal 4 (Week 1): PT to assess stairs.  Skilled Therapeutic Interventions/Progress Updates:  Chart reviewed and pt agreeable to therapy. Pt received seated in WC with 0/10 c/o pain. Session focused on functional transfers, balance, and ambulation to promote safe home mobility and access. Pt initiated session with sit to stand using minA + RW. Pt then sat with repeated sit to stand with VC for sequencing and progressing to CGA + RW. Pt then completed amb of 44ft + 88ft to therapy gym mat using CGA + RW. Pt required 1 seated rest break and demonstrated safe use of DME during amb. In gym, pt completed marching and SLS exercises with noted increased fatigue during RLE weight bearing. Pt required CGA for all R SLS exercises. Pt also completed reaches for objects with CGA. Pt then amb back to room with same need of one seated rest break and CGA + RW t/o. In room, pt completed static standing balance exercises of neutral stance, narrow stance, and neutral stance + eyes closed all with close S + no AD. At end of session, pt was left seated in [facility] with alarm engaged, nurse call bell and all needs in reach.     Therapy Documentation Precautions:  Precautions Precautions: Fall Restrictions Weight Bearing Restrictions Per Provider Order: Yes RLE Weight Bearing Per Provider Order: Weight bearing as tolerated General:      Therapy/Group: Individual Therapy   [name] 11/07/2024, 3:39 PM

## 2024-11-07 NOTE — Progress Notes (Signed)
 "                                                        PROGRESS NOTE   Subjective/Complaints:  Pt doing ok but very tired today, didn't sleep well overnight. Usually uses Benadryl  for sleep, trazodone  last night not very effective.  Pain well managed overall. LBM this morning but has had 5 BMs, would like to cut back on miralax  and senokot. Urinating ok, somewhat same as it has been recently, U/A neg yesterday. No other complaints or concerns.   ROS- neg CP, SOB, abd pain, N/V/D/C. See HPI.   Objective:   No results found.  Recent Labs    11/05/24 0556  WBC 10.4  HGB 12.5*  HCT 36.6*  PLT 304   Recent Labs    11/05/24 0556  NA 136  K 4.2  CL 103  CO2 24  GLUCOSE 107*  BUN 24*  CREATININE 0.61  CALCIUM 8.5*    Intake/Output Summary (Last 24 hours) at 11/07/2024 1046 Last data filed at 11/07/2024 0711 Gross per 24 hour  Intake 720 ml  Output 400 ml  Net 320 ml         Physical Exam: Vital Signs Blood pressure (!) 142/57, pulse 70, temperature 97.7 F (36.5 C), temperature source Oral, resp. rate 17, height 5' 7 (1.702 m), weight 91.8 kg, SpO2 97%.  Constitutional: No distress . Vital signs reviewed. Resting comfortably in bed.  HEENT: NCAT, EOMI, oral membranes moist Neck: supple Cardiovascular: RRR without m/r/g appreciated. No JVD    Respiratory/Chest: CTA Bilaterally without wheezes or rales. Normal effort    GI/Abdomen: soft, +BS throughout, non-tender, non-distended Ext: no clubbing, cyanosis, or edema, TEDs donned Psych: pleasant and cooperative Skin: dry, warm   Assessment/Plan: 1. Functional deficits which require 3+ hours per day of interdisciplinary therapy in a comprehensive inpatient rehab setting. Physiatrist is providing close team supervision and 24 hour management of active medical problems listed below. Physiatrist and rehab team continue to assess barriers to discharge/monitor patient progress toward functional and medical  goals  Care Tool:  Bathing    Body parts bathed by patient: Right arm, Left arm, Chest, Abdomen, Front perineal area, Face, Left upper leg, Right upper leg   Body parts bathed by helper: Right lower leg, Left lower leg, Buttocks     Bathing assist Assist Level: Moderate Assistance - Patient 50 - 74%     Upper Body Dressing/Undressing Upper body dressing   What is the patient wearing?: Pull over shirt    Upper body assist Assist Level: Contact Guard/Touching assist    Lower Body Dressing/Undressing Lower body dressing      What is the patient wearing?: Underwear/pull up, Pants     Lower body assist Assist for lower body dressing: Moderate Assistance - Patient 50 - 74%     Toileting Toileting    Toileting assist Assist for toileting: Moderate Assistance - Patient 50 - 74%     Transfers Chair/bed transfer  Transfers assist     Chair/bed transfer assist level: Minimal Assistance - Patient > 75%     Locomotion Ambulation   Ambulation assist      Assist level: Minimal Assistance - Patient > 75% Assistive device: Walker-rolling Max distance: 15   Walk 10 feet activity   Assist  Assist level: Minimal Assistance - Patient > 75% Assistive device: Walker-rolling   Walk 50 feet activity   Assist Walk 50 feet with 2 turns activity did not occur: Safety/medical concerns         Walk 150 feet activity   Assist Walk 150 feet activity did not occur: Safety/medical concerns         Walk 10 feet on uneven surface  activity   Assist Walk 10 feet on uneven surfaces activity did not occur: Safety/medical concerns         Wheelchair     Assist Is the patient using a wheelchair?: Yes Type of Wheelchair: Manual    Wheelchair assist level: Supervision/Verbal cueing Max wheelchair distance: 40    Wheelchair 50 feet with 2 turns activity    Assist        Assist Level: Minimal Assistance - Patient > 75%   Wheelchair 150 feet  activity     Assist      Assist Level: Dependent - Patient 0%   Blood pressure (!) 142/57, pulse 70, temperature 97.7 F (36.5 C), temperature source Oral, resp. rate 17, height 5' 7 (1.702 m), weight 91.8 kg, SpO2 97%.  Medical Problem List and Plan: 1. Functional deficits secondary to R hip septic arthritis s/p I&D 12/14              -patient may shower with surgical sites covered             -ELOS/Goals: 10-14 days, SPV             -Continue CIR   2.  Antithrombotics: -DVT/anticoagulation:  Mechanical: Sequential compression devices, below knee Bilateral lower extremities Pharmaceutical: Lovenox  40mg  daily             -antiplatelet therapy: N/A   3. Pain Management: Tylenol  1000 mg 3 times daily, gabapentin  300 mg 3 times daily, lidocaine  5% patch, Robaxin  500 mg 3 times daily.  PRN: Tylenol  650 mg and Oxycodone  5 mg q4h prn-- changed tylenol  to 500mg  BID PRN to avoid overdosing.    4. Mood/Behavior/Sleep: LCSW to follow for evaluation and support when available.              -antipsychotic agents: N/A  -trazodone  PRN -11/07/24 pt uses benadryl  at home, will change order so he can use this for sleep, trazodone  left as back up.    5. Neuropsych/cognition: This patient is capable of making decisions on his own behalf.   6. Skin/Wound Care: Routine pressure relief measures. Terbinafine  250mg  weekly   7. Fluids/Electrolytes/Nutrition: Monitor I&O and weight. Follow up labs. Continue vitamins/supplements.  -11/06/24 mild transaminitis at admission here, AST 86/ALT 64, Alk phos 216-- monitor, LFTs added on for Monday. Could be from terbinafine ?   8. Right hip septic arthritis/MSSA bactermia: Afebrile, blood cultures neg from 12/14.  Per ID continue IV cefazolin  course EOT 12/12/2024. PICC line to be placed in CIR 12/18.    9. Hypertension: Monitor BP per protocol. Continue losartan -hydrochlorothiazide  50-12.5mg  daily at home.   -12/20-21/25 BPs great, monitor Vitals:    11/04/24 1410 11/04/24 1958 11/05/24 0450 11/05/24 1403  BP: (!) 131/51 136/61 (!) 119/59 (!) 111/52   11/05/24 2015 11/06/24 0311 11/06/24 1336 11/06/24 2309  BP: (!) 119/58 (!) 127/57 (!) 124/58 (!) 112/50   11/07/24 0415 11/07/24 0917  BP: (!) 125/56 (!) 142/57      10.OSA: Continue CPAP nightly.    11. Hyponatremia: 12/17 NA 139, continue to monitor with labs. Resolved  12/19, 136   12. HLD: Simvastatin  40mg  nightly  13. Bowel management: colace 100mg  daily, miralax  BID, senokot 2 tabs BID -11/07/24 now having too many BMs, will make miralax  and senokot PRN, advised to ask for them if he goes more than a couple days without BM  14. Urinary urgency/dribbling: -11/06/24 has been going on for at least a week or so, U/A's done last week with rare bacteria and no leuks/nitrites; will repeat U/A today but could just be prostate related. No retention. Monitor and await U/A -11/07/24 U/A fine. Could be prostate related?  LOS: 3 days A FACE TO FACE EVALUATION WAS PERFORMED  [address] 11/07/2024, 10:46 AM     "

## 2024-11-07 NOTE — Progress Notes (Signed)
 Occupational Therapy Session Note  Patient Details  Name: Duane Greene MRN: [id number] Date of Birth: 07-25-1951  Today's Date: 11/07/2024 OT Individual Time: 1105-1203 OT Individual Time Calculation (min): 58 min  17 mins missed d/t fatigue   Short Term Goals: Week 1:  OT Short Term Goal 1 (Week 1): pt will be able to don pants over feet using reacher if needed with supervision. OT Short Term Goal 2 (Week 1): Pt will be able to rise to stand with min A to CGA to prepare for toileting needs. OT Short Term Goal 3 (Week 1): pt will be able to ambulate 20 ft from bed to toilet with RW with CGA.  Skilled Therapeutic Interventions/Progress Updates:  Pt greeted asleep in supine, pts wife requested  pt to have time to rest. Returned 17 mins later and  pt agreeable to OT intervention, requesting to use the restroom.   Transfers/bed mobility/functional mobility:  Pt completed supine>sit with MODA needing assist to [facility] RLE, mod cues needed to sequence steps such as when to reach for railing, physical assist needed to scoot hips to EOB with use of bed pad.  Pt able to stand from EOB with MIN A, recommended pt push up from EOB with R hand on bed and L hand on RW. Pt reports getting conflicting recommendations for hand placement during sit>stands.   Worked on blocked practice of sit>stands in gym on EOM with initially needing MIN A but progressed to supervision! With R hand pushing from sitting surface and L hand on RW.   Practiced ambulatory transfers in apt to recliner with RW and CGA. Pt able to stand from low recliner with MODA. Pt reports he [place].   ADLs:  Transfers: ambulatory toilet transfer from [facility] over toilet with RW and MINA. Min verbal cues needed for upright posture and RW proximity.   Toileting: pt completed 3/3 toileting tasks with MODA, pt preferred his wife to provide pericare, pt did assist  with pulling pants up on R side. Continent b/b void.                  Ended session with pt seated in w/c with all needs within reach and wife present.             Therapy Documentation Precautions:  Precautions Precautions: Fall Restrictions Weight Bearing Restrictions Per Provider Order: Yes RLE Weight Bearing Per Provider Order: Weight bearing as tolerated  Pain: 8/10 pain reported in RLE, rest breaks provided as needed.  Therapy/Group: Individual Therapy  [name] 11/07/2024, 12:22 PM

## 2024-11-07 NOTE — Progress Notes (Signed)
 Occupational Therapy Session Note  Patient Details  Name: Duane Greene MRN: [id number] Date of Birth: 1951-06-25  Today's Date: 11/07/2024 OT Individual Time: 9197-9084 OT Individual Time Calculation (min): 73 min    Short Term Goals: Week 1:  OT Short Term Goal 1 (Week 1): pt will be able to don pants over feet using reacher if needed with supervision. OT Short Term Goal 2 (Week 1): Pt will be able to rise to stand with min A to CGA to prepare for toileting needs. OT Short Term Goal 3 (Week 1): pt will be able to ambulate 20 ft from bed to toilet with RW with CGA.  Skilled Therapeutic Interventions/Progress Updates:    Pt received in bed and agreeable to a shower.  Pt's wife in room but sound asleep during the session. Encouraged her to go home to rest as pt is stable.    Pt stated that while I was gathering towels and supplies for the shower he had a BM in his brief.  Bed mobility in and out of bed mod A due to painful hip.  Sit to stand from elevated bed CGA STS elevated toilet (BSC over toilet)   min A.  Mod A for cleansing.  Informed RN of incontinence of both B and B.   STS tub bench min A  Pt opted to try to walk in and out of bathroom with RW vs using wc to avoid having to do extra STS with the transfers.  Min RW to toilet then tub bench.  See ADL documentation below. Overall pt has improved activity tolerance and tolerance with sit to stands.   Used a sports administrator to work on donning pants.  Would benefit from a long handled sponge.    Pt requested to lay down to rest as he had not slept well the night before.     Pt in bed with all needs met.   Therapy Documentation Precautions:  Precautions Precautions: Fall Restrictions Weight Bearing Restrictions Per Provider Order: Yes RLE Weight Bearing Per Provider Order: Weight bearing as tolerated   Vital Signs: Therapy Vitals Pulse Rate: 70 Resp: 17 BP: (!) 142/57 Patient Position (if appropriate): Lying Pain: Pain  Assessment Pain Scale: 0-10 Pain Score: 5  Pain Location: Hip Pain Intervention(s): Medication (See eMAR) ADL: ADL Eating: Independent Grooming: Independent Upper Body Bathing: Setup Where Assessed-Upper Body Bathing: Shower Lower Body Bathing: Moderate assistance Where Assessed-Lower Body Bathing: Shower Upper Body Dressing: Minimal assistance Where Assessed-Upper Body Dressing: Wheelchair Lower Body Dressing: Maximal assistance Where Assessed-Lower Body Dressing: Wheelchair Toileting: Moderate assistance Where Assessed-Toileting: Teacher, Adult Education: Curator Method: Proofreader: Grab bars, Raised toilet seat Film/video Editor: Insurance Underwriter Method: Designer, Industrial/product: Emergency planning/management officer, Grab bars   Therapy/Group: Individual Therapy  Duane Greene 11/07/2024, 12:15 PM

## 2024-11-08 LAB — CBC WITH DIFFERENTIAL/PLATELET
Abs Immature Granulocytes: 0.18 K/uL — ABNORMAL HIGH (ref 0.00–0.07)
Basophils Absolute: 0 K/uL (ref 0.0–0.1)
Basophils Relative: 0 %
Eosinophils Absolute: 0.1 K/uL (ref 0.0–0.5)
Eosinophils Relative: 1 %
HCT: 36.1 % — ABNORMAL LOW (ref 39.0–52.0)
Hemoglobin: 12.1 g/dL — ABNORMAL LOW (ref 13.0–17.0)
Immature Granulocytes: 2 %
Lymphocytes Relative: 14 %
Lymphs Abs: 1.3 K/uL (ref 0.7–4.0)
MCH: 32.1 pg (ref 26.0–34.0)
MCHC: 33.5 g/dL (ref 30.0–36.0)
MCV: 95.8 fL (ref 80.0–100.0)
Monocytes Absolute: 0.9 K/uL (ref 0.1–1.0)
Monocytes Relative: 9 %
Neutro Abs: 7 K/uL (ref 1.7–7.7)
Neutrophils Relative %: 74 %
Platelets: 385 K/uL (ref 150–400)
RBC: 3.77 MIL/uL — ABNORMAL LOW (ref 4.22–5.81)
RDW: 13.7 % (ref 11.5–15.5)
WBC: 9.5 K/uL (ref 4.0–10.5)
nRBC: 0 % (ref 0.0–0.2)

## 2024-11-08 LAB — BASIC METABOLIC PANEL WITH GFR
Anion gap: 7 (ref 5–15)
BUN: 26 mg/dL — ABNORMAL HIGH (ref 8–23)
CO2: 28 mmol/L (ref 22–32)
Calcium: 8.5 mg/dL — ABNORMAL LOW (ref 8.9–10.3)
Chloride: 103 mmol/L (ref 98–111)
Creatinine, Ser: 0.65 mg/dL (ref 0.61–1.24)
GFR, Estimated: >60 mL/min
Glucose, Bld: 104 mg/dL — ABNORMAL HIGH (ref 70–99)
Potassium: 3.9 mmol/L (ref 3.5–5.1)
Sodium: 139 mmol/L (ref 135–145)

## 2024-11-08 LAB — HEPATIC FUNCTION PANEL
ALT: 69 U/L — ABNORMAL HIGH (ref 0–44)
AST: 100 U/L — ABNORMAL HIGH (ref 15–41)
Albumin: 2.6 g/dL — ABNORMAL LOW (ref 3.5–5.0)
Alkaline Phosphatase: 246 U/L — ABNORMAL HIGH (ref 38–126)
Bilirubin, Direct: 0.3 mg/dL — ABNORMAL HIGH (ref 0.0–0.2)
Indirect Bilirubin: 0.2 mg/dL — ABNORMAL LOW (ref 0.3–0.9)
Total Bilirubin: 0.4 mg/dL (ref 0.0–1.2)
Total Protein: 6.4 g/dL — ABNORMAL LOW (ref 6.5–8.1)

## 2024-11-08 NOTE — Progress Notes (Signed)
 Physical Therapy Session Note  Patient Details  Name: Duane Greene MRN: [id number] Date of Birth: 1951/07/20  Today's Date: 11/08/2024 PT Individual Time: 1345-1455 PT Individual Time Calculation (min): 70 min   Short Term Goals: Week 1:  PT Short Term Goal 1 (Week 1): Pt will perform bed mobility w/ min A on flat bed. PT Short Term Goal 2 (Week 1): Pt will transfer sit to stand w/ min A consistently. PT Short Term Goal 3 (Week 1): Pt will amb x 50' w/ RW and CGA PT Short Term Goal 4 (Week 1): PT to assess stairs.  Skilled Therapeutic Interventions/Progress Updates:      Pt sitting upright in w/c to start - wife present throughout session for active observation and education.   Pt transported at w/c level for energy conservation to main gym.   Sit<>stand to RW with CGA with cues for hand placement only. Ambulated 159ft with CGA and RW - intially ambulates with a step-to pattern, antalgic - cues for progressing to reciprocal stepping and increasing gait speed - pt able to respond to cues well.   Stair training initiated using 6 steps and 2 HR's. Educated on L foot leading ascent and R leading descent. Pt familiar with technique from prior surgeries. Pt navigated up/down x4 + x4 stairs with CGA (seated rest breaks b/w sets).   Pt taken to ortho gym and assisted onto the Nustep with CGA and RW. SetupA needed for RLE due to pain and weakness. Pt setup with double hills program, emphasis on AAROM > strengthening for his R hip. Cues for using his hands/arms to help with AAROM. Completed x10 minutes, 186 steps total.   Pt returned to his room and he elected to return to bed to rest. CGA for stand pivot transfer to bed using the RW. ModA for returning to supine for trunk and BLE management. Ice pack provided for hip pain and edema management. Needs met at end of session.     Therapy Documentation Precautions:  Precautions Precautions: Fall Restrictions Weight Bearing Restrictions Per  Provider Order: Yes RLE Weight Bearing Per Provider Order: Weight bearing as tolerated General:       Therapy/Group: Individual Therapy  Kalecia Hartney P Lincy Belles 11/08/2024, 2:05 PM

## 2024-11-08 NOTE — Progress Notes (Signed)
 Occupational Therapy Session Note  Patient Details  Name: Duane Greene MRN: [id number] Date of Birth: 1951/07/13  Today's Date: 11/08/2024 OT Individual Time: 8954-8841 OT Individual Time Calculation (min): 73 min    Short Term Goals: Week 1:  OT Short Term Goal 1 (Week 1): pt will be able to don pants over feet using reacher if needed with supervision. OT Short Term Goal 2 (Week 1): Pt will be able to rise to stand with min A to CGA to prepare for toileting needs. OT Short Term Goal 3 (Week 1): pt will be able to ambulate 20 ft from bed to toilet with RW with CGA.  Skilled Therapeutic Interventions/Progress Updates:    Pt received in bed with ice pack on R hip. [name]  Pt agreeable to working on R LE PROM, AROM, A/AROM using powder board on bed. Pt did well tolerating hip and knee flexion and internal rotation.  He felt discomfort with external rotation with knee flexed.    To address bed mobility, tried to see if he would have less pain rolling to L hip but [name] with rolling.  He felt moving to the R even though there is pressure on his hip was less uncomfortable.  He continues to need mod A due to not have a bed rail on his bed long enough to pull up with.    (Later took pt and his wife to ADL apt as bed height is similar to the height of their bed at home) Showed them the under the mattress rail and how it can be placed more at his mid torso vs shoulder and then he could pull up on it.   Pt too tired to try, but could try in later PT session.    From EOB pt worked on R knee extension.   Sit to stand CGA to close S to RW.   Pt ambulated 25 ft with RW in hallway with CGA and cues to keep posture upright.    Ambulated another 18 ft after a seated rest break.    For postural strength, had pt work on rows and arm band pull using red theraband.   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  [name].   Therapy Documentation Precautions:  Precautions Precautions: Fall Restrictions Weight Bearing Restrictions Per Provider Order: Yes RLE Weight Bearing Per Provider Order: Weight bearing as tolerated  Vital Signs: Therapy Vitals Temp: 97.8 F (36.6 C) Pulse Rate: 68 Resp: 18 BP: 124/60 Patient Position (if appropriate): Lying Oxygen Therapy SpO2: 97 % O2 Device: Room Air Pain: Pain Assessment Pain Scale: 0-10 Pain Score: 4  Pain Location: Hip Pain Intervention(s): Medication (See eMAR) ADL: ADL Eating: Independent Grooming: Independent Upper Body Bathing: Setup Where Assessed-Upper Body Bathing: Shower Lower Body Bathing: Moderate assistance Where Assessed-Lower Body Bathing: Shower Upper Body Dressing: Minimal assistance Where Assessed-Upper Body Dressing: Wheelchair Lower Body Dressing: Maximal assistance Where Assessed-Lower Body Dressing: Wheelchair Toileting: Moderate assistance Where Assessed-Toileting: Teacher, Adult Education: Curator Method: Proofreader: Grab bars, Raised toilet seat Film/video Editor: Insurance Underwriter Method: Designer, Industrial/product: Emergency planning/management officer, Grab bars  Therapy/Group: Individual Therapy  Curley Fayette 11/08/2024, 8:26 AM

## 2024-11-08 NOTE — Progress Notes (Signed)
 "                                                        PROGRESS NOTE   Subjective/Complaints:  No severe pain   ROS- neg CP, SOB, abd pain, N/V/D/C. See HPI.   Objective:   No results found.  Recent Labs    11/08/24 0315  WBC 9.5  HGB 12.1*  HCT 36.1*  PLT 385   Recent Labs    11/08/24 0315  NA 139  K 3.9  CL 103  CO2 28  GLUCOSE 104*  BUN 26*  CREATININE 0.65  CALCIUM 8.5*    Intake/Output Summary (Last 24 hours) at 11/08/2024 0809 Last data filed at 11/08/2024 0700 Gross per 24 hour  Intake 988.04 ml  Output --  Net 988.04 ml         Physical Exam: Vital Signs Blood pressure 124/60, pulse 68, temperature 97.8 F (36.6 C), resp. rate 18, height 5' 7 (1.702 m), weight 91.8 kg, SpO2 97%.   General: No acute distress Mood and affect are appropriate Heart: Regular rate and rhythm no rubs murmurs or extra sounds Lungs: Clear to auscultation, breathing unlabored, no rales or wheezes Abdomen: Positive bowel sounds, soft nontender to palpation, nondistended Extremities: No clubbing, cyanosis, or edema Skin: No evidence of breakdown, no evidence of rash Neurologic: Cranial nerves II through XII intact, motor strength is 5/5 in bilateral deltoid, bicep, tricep, grip,5/5  Left , 4/5 Right hip flexor, knee extensors, ankle dorsiflexor and plantar flexor Sensory exam normal sensation to light touch and proprioception in bilateral upper and lower extremities  Musculoskeletal: Full range of motion in all 4 extremities. No joint swelling    Assessment/Plan: 1. Functional deficits which require 3+ hours per day of interdisciplinary therapy in a comprehensive inpatient rehab setting. Physiatrist is providing close team supervision and 24 hour management of active medical problems listed below. Physiatrist and rehab team continue to assess barriers to discharge/monitor patient progress toward functional and medical goals  Care Tool:  Bathing    Body parts  bathed by patient: Right arm, Left arm, Chest, Abdomen, Front perineal area, Face, Left upper leg, Right upper leg   Body parts bathed by helper: Right lower leg, Left lower leg, Buttocks     Bathing assist Assist Level: Moderate Assistance - Patient 50 - 74%     Upper Body Dressing/Undressing Upper body dressing   What is the patient wearing?: Pull over shirt    Upper body assist Assist Level: Contact Guard/Touching assist    Lower Body Dressing/Undressing Lower body dressing      What is the patient wearing?: Underwear/pull up, Pants     Lower body assist Assist for lower body dressing: Moderate Assistance - Patient 50 - 74%     Toileting Toileting    Toileting assist Assist for toileting: Moderate Assistance - Patient 50 - 74%     Transfers Chair/bed transfer  Transfers assist     Chair/bed transfer assist level: Minimal Assistance - Patient > 75%     Locomotion Ambulation   Ambulation assist      Assist level: Minimal Assistance - Patient > 75% Assistive device: Walker-rolling Max distance: 15   Walk 10 feet activity   Assist     Assist level: Minimal Assistance - Patient > 75%  Assistive device: Walker-rolling   Walk 50 feet activity   Assist Walk 50 feet with 2 turns activity did not occur: Safety/medical concerns         Walk 150 feet activity   Assist Walk 150 feet activity did not occur: Safety/medical concerns         Walk 10 feet on uneven surface  activity   Assist Walk 10 feet on uneven surfaces activity did not occur: Safety/medical concerns         Wheelchair     Assist Is the patient using a wheelchair?: Yes Type of Wheelchair: Manual    Wheelchair assist level: Supervision/Verbal cueing Max wheelchair distance: 40    Wheelchair 50 feet with 2 turns activity    Assist        Assist Level: Minimal Assistance - Patient > 75%   Wheelchair 150 feet activity     Assist      Assist Level:  Dependent - Patient 0%   Blood pressure 124/60, pulse 68, temperature 97.8 F (36.6 C), resp. rate 18, height 5' 7 (1.702 m), weight 91.8 kg, SpO2 97%.  Medical Problem List and Plan: 1. Functional deficits secondary to R hip septic arthritis s/p I&D 12/14              -patient may shower with surgical sites covered             -ELOS/Goals: 10-14 days, SPV             -Continue CIR   2.  Antithrombotics: -DVT/anticoagulation:  Mechanical: Sequential compression devices, below knee Bilateral lower extremities Pharmaceutical: Lovenox  40mg  daily             -antiplatelet therapy: N/A   3. Pain Management: Tylenol  1000 mg 3 times daily, gabapentin  300 mg 3 times daily, lidocaine  5% patch, Robaxin  500 mg 3 times daily.  PRN: Tylenol  650 mg and Oxycodone  5 mg q4h prn-- changed tylenol  to 500mg  BID PRN to avoid overdosing.    4. Mood/Behavior/Sleep: LCSW to follow for evaluation and support when available.              -antipsychotic agents: N/A  -trazodone  PRN -11/07/24 pt uses benadryl  at home, will change order so he can use this for sleep, trazodone  left as back up.    5. Neuropsych/cognition: This patient is capable of making decisions on his own behalf.   6. Skin/Wound Care: Routine pressure relief measures. 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 Fluids/Electrolytes/Nutrition: Monitor I&O and weight. Follow up labs. Continue vitamins/supplements.  -11/06/24 mild transaminitis at admission here, AST 86/ALT 64, Alk phos 216-- monitor, LFTs added on for Monday. Could be from terbinafine  vs tylenol     8. Right hip septic arthritis/MSSA bactermia: Afebrile, blood cultures neg from 12/14.  Per ID continue IV cefazolin  course EOT 12/12/2024. PICC line to be placed in CIR 12/18.    9. Hypertension: Monitor BP per protocol. Continue losartan -hydrochlorothiazide  50-12.5mg  daily at home.   -12/20-21/25 BPs great, monitor Vitals:    11/04/24 1958 11/05/24 0450 11/05/24 1403 11/05/24 2015  BP: 136/61 (!) 119/59 (!) 111/52 (!) 119/58   11/06/24 0311 11/06/24 1336 11/06/24 2309 11/07/24 0415  BP: (!) 127/57 (!) 124/58 (!) 112/50 (!) 125/56   11/07/24 0917 11/07/24 1343 11/07/24 1951 11/08/24 0523  BP: (!) 142/57 (!) 120/59 (!) 125/55 124/60      10.OSA: Continue CPAP nightly.  11. Hyponatremia: resolved   12. HLD: Simvastatin  40mg  nightly  13. Bowel management: colace 100mg  daily, miralax  BID, senokot 2 tabs BID -11/07/24 now having too many BMs, will make miralax  and senokot PRN, advised to ask for them if he goes more than a couple days without BM  14. Urinary urgency/dribbling: UA neg, likely BPH, consider flomax  trial   LOS: 4 days A FACE TO FACE EVALUATION WAS PERFORMED  Duane Greene 11/08/2024, 8:09 AM     "

## 2024-11-08 NOTE — Progress Notes (Signed)
 Occupational Therapy Session Note  Patient Details  Name: Duane Greene MRN: [id number] Date of Birth: Sep 01, 1951  Today's Date: 11/08/2024 OT Individual Time: 9154-9041 OT Individual Time Calculation (min): 73 min    Short Term Goals: Week 1:  OT Short Term Goal 1 (Week 1): pt will be able to don pants over feet using reacher if needed with supervision. OT Short Term Goal 2 (Week 1): Pt will be able to rise to stand with min A to CGA to prepare for toileting needs. OT Short Term Goal 3 (Week 1): pt will be able to ambulate 20 ft from bed to toilet with RW with CGA.   Skilled Therapeutic Interventions/Progress Updates:    Pt bed level at time of session, IV running and remained throughout session. 2/10 pain throughout but able to participate. Spouse present throughout session as well. Initial focus on rapport building regarding medical events. Pt performing supine > sit using gait belt as a leg lifter with MOD A, sitting EOB donning pants with reacher MIN A, needing more assist with brief. Sit <> stands throughout with CGA and cues for hand placement. Pt performing functional mob bed > throughout hall approx 50-60 feet with CGA and wheelchair follow. Pt needing to urgently use bathroom, wheelchair transport back. Stand pivot wheelchair <> toilet CGA, using grab bar for sit <> stands and recommended not using as he does not have at home. Wife performing pericare per pt request. Clothing management assist for brief but able to do pants. Set up in wheelchair alarm onc all bell in reach all needs met.     Therapy Documentation Precautions:  Precautions Precautions: Fall Restrictions Weight Bearing Restrictions Per Provider Order: Yes RLE Weight Bearing Per Provider Order: Weight bearing as tolerated    Therapy/Group: Individual Therapy  Duane Greene 11/08/2024, 7:24 AM

## 2024-11-09 DIAGNOSIS — G8918 Other acute postprocedural pain: Secondary | ICD-10-CM

## 2024-11-09 MED ORDER — OXYCODONE HCL 5 MG PO TABS
2.5000 mg | ORAL_TABLET | ORAL | Status: DC | PRN
  Administered 2024-11-09 – 2024-11-18 (×8): 2.5 mg via ORAL
  Filled 2024-11-09 (×5): qty 1

## 2024-11-09 MED ORDER — BIOTENE DRY MOUTH MT LIQD: 15.0000 mL | OROMUCOSAL | Status: DC | PRN

## 2024-11-09 MED ORDER — OXYCODONE-ACETAMINOPHEN 5-325 MG PO TABS
1.0000 | ORAL_TABLET | ORAL | Status: DC | PRN
  Administered 2024-11-09 – 2024-11-10 (×3): 1 via ORAL
  Filled 2024-11-09 (×5): qty 1

## 2024-11-09 MED ORDER — OXYCODONE-ACETAMINOPHEN 7.5-325 MG PO TABS: 1.0000 | ORAL_TABLET | ORAL | Status: DC | PRN

## 2024-11-09 MED ORDER — SODIUM CHLORIDE 0.9% FLUSH
10.0000 mL | Freq: Two times a day (BID) | INTRAVENOUS | Status: DC
  Administered 2024-11-10 – 2024-11-17 (×11): 10 mL
  Administered 2024-11-17: 20 mL
  Administered 2024-11-18 – 2024-11-19 (×3): 10 mL

## 2024-11-09 MED ORDER — SODIUM CHLORIDE 0.9% FLUSH: 10.0000 mL | INTRAVENOUS | Status: DC | PRN

## 2024-11-09 NOTE — Progress Notes (Signed)
 "                                                        PROGRESS NOTE   Subjective/Complaints:  Per RN , RIght hip pain not relieved by Oxy IR 5mg    ROS- neg CP, SOB, abd pain, N/V/D/C. See HPI.   Objective:   No results found.  Recent Labs    11/08/24 0315  WBC 9.5  HGB 12.1*  HCT 36.1*  PLT 385   Recent Labs    11/08/24 0315  NA 139  K 3.9  CL 103  CO2 28  GLUCOSE 104*  BUN 26*  CREATININE 0.65  CALCIUM 8.5*    Intake/Output Summary (Last 24 hours) at 11/09/2024 0838 Last data filed at 11/09/2024 0826 Gross per 24 hour  Intake 236 ml  Output 200 ml  Net 36 ml         Physical Exam: Vital Signs Blood pressure (!) 131/57, pulse 69, temperature 98.9 F (37.2 C), temperature source Oral, resp. rate 18, height 5' 7 (1.702 m), weight 91.8 kg, SpO2 95%.   General: No acute distress Mood and affect are appropriate Heart: Regular rate and rhythm no rubs murmurs or extra sounds Lungs: Clear to auscultation, breathing unlabored, no rales or wheezes Abdomen: Positive bowel sounds, soft nontender to palpation, nondistended Extremities: No clubbing, cyanosis, or edema Skin: No evidence of breakdown, no evidence of rash Neurologic: Cranial nerves II through XII intact, motor strength is 5/5 in bilateral deltoid, bicep, tricep, grip,5/5  Left , 4/5 Right hip flexor, knee extensors, ankle dorsiflexor and plantar flexor Sensory exam normal sensation to light touch and proprioception in bilateral upper and lower extremities  Musculoskeletal: Full range of motion in all 4 extremities. No joint swelling    Assessment/Plan: 1. Functional deficits which require 3+ hours per day of interdisciplinary therapy in a comprehensive inpatient rehab setting. Physiatrist is providing close team supervision and 24 hour management of active medical problems listed below. Physiatrist and rehab team continue to assess barriers to discharge/monitor patient progress toward functional  and medical goals  Care Tool:  Bathing    Body parts bathed by patient: Right arm, Left arm, Chest, Abdomen, Front perineal area, Face, Left upper leg, Right upper leg   Body parts bathed by helper: Right lower leg, Left lower leg, Buttocks     Bathing assist Assist Level: Moderate Assistance - Patient 50 - 74%     Upper Body Dressing/Undressing Upper body dressing   What is the patient wearing?: Pull over shirt    Upper body assist Assist Level: Contact Guard/Touching assist    Lower Body Dressing/Undressing Lower body dressing      What is the patient wearing?: Underwear/pull up, Pants     Lower body assist Assist for lower body dressing: Moderate Assistance - Patient 50 - 74%     Toileting Toileting    Toileting assist Assist for toileting: Moderate Assistance - Patient 50 - 74%     Transfers Chair/bed transfer  Transfers assist     Chair/bed transfer assist level: Contact Guard/Touching assist     Locomotion Ambulation   Ambulation assist      Assist level: Contact Guard/Touching assist Assistive device: Walker-rolling Max distance: 110'   Walk 10 feet activity   Assist  Assist level: Contact Guard/Touching assist Assistive device: Walker-rolling   Walk 50 feet activity   Assist Walk 50 feet with 2 turns activity did not occur: Safety/medical concerns  Assist level: Contact Guard/Touching assist Assistive device: Walker-rolling    Walk 150 feet activity   Assist Walk 150 feet activity did not occur: Safety/medical concerns         Walk 10 feet on uneven surface  activity   Assist Walk 10 feet on uneven surfaces activity did not occur: Safety/medical concerns         Wheelchair     Assist Is the patient using a wheelchair?: Yes Type of Wheelchair: Manual    Wheelchair assist level: Supervision/Verbal cueing Max wheelchair distance: 40    Wheelchair 50 feet with 2 turns activity    Assist         Assist Level: Minimal Assistance - Patient > 75%   Wheelchair 150 feet activity     Assist      Assist Level: Dependent - Patient 0%   Blood pressure (!) 131/57, pulse 69, temperature 98.9 F (37.2 C), temperature source Oral, resp. rate 18, height 5' 7 (1.702 m), weight 91.8 kg, SpO2 95%.  Medical Problem List and Plan: 1. Functional deficits secondary to R hip septic arthritis s/p I&D 12/14              -patient may shower with surgical sites covered             -ELOS/Goals: 10-14 days, SPV             -Continue CIR   2.  Antithrombotics: -DVT/anticoagulation:  Mechanical: Sequential compression devices, below knee Bilateral lower extremities Pharmaceutical: Lovenox  40mg  daily             -antiplatelet therapy: N/A   3. Pain Management: Tylenol  1000 mg 3 times daily, gabapentin  300 mg 3 times daily, lidocaine  5% patch, Robaxin  500 mg 3 times daily.  PRN: Tylenol  650 mg and Oxycodone  5 mg q4h prn-- changed tylenol  to 500mg  BID PRN to avoid overdosing.   Change Oxy IR to Percocet 7.5 mg q4h prn  4. Mood/Behavior/Sleep: LCSW to follow for evaluation and support when available.              -antipsychotic agents: N/A  -trazodone  PRN -11/07/24 pt uses benadryl  at home, will change order so he can use this for sleep, trazodone  left as back up.    5. Neuropsych/cognition: This patient is capable of making decisions on his own behalf.   6. Skin/Wound Care: Routine pressure relief measures. 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 Fluids/Electrolytes/Nutrition: Monitor I&O and weight. Follow up labs. Continue vitamins/supplements.  -11/06/24 mild transaminitis at admission here, AST 86/ALT 64, Alk phos 216-- monitor, LFTs added on for Monday. Could be from terbinafine  vs tylenol     8. Right hip septic arthritis/MSSA bactermia: Afebrile, blood cultures neg from 12/14.  Per ID continue IV cefazolin  course EOT  12/12/2024. PICC line to be placed in CIR 12/18.    9. Hypertension: Monitor BP per protocol. Continue losartan -hydrochlorothiazide  50-12.5mg  daily at home.   -12/20-21/25 BPs great, monitor Vitals:   11/05/24 2015 11/06/24 0311 11/06/24 1336 11/06/24 2309  BP: (!) 119/58 (!) 127/57 (!) 124/58 (!) 112/50   11/07/24 0415 11/07/24 0917 11/07/24 1343 11/07/24 1951  BP: (!) 125/56 (!) 142/57 (!) 120/59 (!) 125/55   11/08/24 0523 11/08/24 1501  11/08/24 1957 11/09/24 0641  BP: 124/60 (!) 111/52 (!) 132/56 (!) 131/57      10.OSA: Continue CPAP nightly.    11. Hyponatremia: resolved   12. HLD: Simvastatin  40mg  nightly  13. Bowel management: colace 100mg  daily, miralax  BID, senokot 2 tabs BID -11/07/24 now having too many BMs, will make miralax  and senokot PRN, advised to ask for them if he goes more than a couple days without BM  14. Urinary urgency/dribbling: UA neg, likely BPH, consider flomax  trial   LOS: 5 days A FACE TO FACE EVALUATION WAS PERFORMED  Duane Greene 11/09/2024, 8:38 AM     "

## 2024-11-09 NOTE — Progress Notes (Signed)
 Occupational Therapy Session Note  Patient Details  Name: Duane Greene MRN: [id number] Date of Birth: September 17, 1951  Today's Date: 11/09/2024 OT Individual Time: 9269-9154 OT Individual Time Calculation (min): 75 min    Short Term Goals: Week 1:  OT Short Term Goal 1 (Week 1): pt will be able to don pants over feet using reacher if needed with supervision. OT Short Term Goal 2 (Week 1): Pt will be able to rise to stand with min A to CGA to prepare for toileting needs. OT Short Term Goal 3 (Week 1): pt will be able to ambulate 20 ft from bed to toilet with RW with CGA.  Skilled Therapeutic Interventions/Progress Updates:    Patient agreeable to participate in OT session. Reports 6/10 pain level.   Patient participated in skilled OT session focusing on self care bathing, dressing, toileting. Patient received in bed following incontinent episode of bowel. Patient requested to complete shower following. Patient able to complete bed mobility max A due to increased stiffness. Patient able to complete stand pivot to wc with min A from elevated bed. UB doffing SUP A. Transfer into walk in shower with chair stand pivot with minA. Patient able to shower UB sup, LB min A to mod A due to standing balance and trouble bending to access LB. Patient able to wash buttocks with min A for thoroughness. Patient required increased time due to decreased activity tolerance. Patient then completed UB dressing SU, LB dressing min to mod A to thread legs utilizing reacher due to decreased functional reach and activity tolerance. Therapist facilitated increase in balance, reaching, and activity tolerance in order to improve self care skills.  Patient total A for footwear. All needs placed in reach alarm on.     Therapy Documentation Precautions:  Precautions Precautions: Fall Restrictions Weight Bearing Restrictions Per Provider Order: Yes RLE Weight Bearing Per Provider Order: Weight bearing as  tolerated  Therapy/Group: Individual Therapy  [name] 11/09/2024, 7:22 AM

## 2024-11-09 NOTE — Progress Notes (Signed)
 Physical Therapy Session Note  Patient Details  Name: Duane Greene MRN: [id number] Date of Birth: 04-May-1951  Today's Date: 11/09/2024 PT Individual Time: 1340-1436 PT Individual Time Calculation (min): 56 min   Short Term Goals: Week 1:  PT Short Term Goal 1 (Week 1): Pt will perform bed mobility w/ min A on flat bed. PT Short Term Goal 2 (Week 1): Pt will transfer sit to stand w/ min A consistently. PT Short Term Goal 3 (Week 1): Pt will amb x 50' w/ RW and CGA PT Short Term Goal 4 (Week 1): PT to assess stairs.  Skilled Therapeutic Interventions/Progress Updates:       Pt presents in bed to start - his wife present throughout session for active observation. Pt reports having a rough day 2/2 pain and lack of sleep. Pt willing to try to complete his therapy for the day.   Supine<>sitting EOB with minA for trunk support. Donned slip-on shoes with assist for time management. Sit<>stand to RW with CGA and completes stand step transfer with CGA and RW from bed to w/c.    Pt transported to main gym for time management.    Sit<>stand to RW with CGA from w/c with cues for hand placement only. Pt ambulated 100' + 65' with CGA and RW in rehab spaces - gait antalgic with increased reliance of UE support through walker frame. Slow gait speed and increased unsteadiness with turns. Pt reports 8/10 RPE after 100' of gait so extended rest break provided.    Pt instructed in supine there-ex for RLE strengthening. AAROM provided at times due to pain and weakness.  -2x15 heel slides -2x15 hip abd/add -2x15 knee fall outs -2x15 isometric ball squeezes with ball b/w knees  *rest breaks as needed. Repositioning as needed for comfort.   Pt returned to his room and pt electing to stay sitting upright in the wheelchair. RLE elevated to better improve tolerance OOB.      Therapy Documentation Precautions:  Precautions Precautions: Fall Restrictions Weight Bearing Restrictions Per Provider  Order: Yes RLE Weight Bearing Per Provider Order: Weight bearing as tolerated General:     Therapy/Group: Individual Therapy  Caitlinn Klinker P Zorian Gunderman 11/09/2024 8:00 AM

## 2024-11-09 NOTE — Progress Notes (Signed)
 Occupational Therapy Session Note  Patient Details  Name: Duane Greene MRN: [id number] Date of Birth: 1951/02/13  Today's Date: 11/09/2024 OT Individual Time: 9069-8954 OT Individual Time Calculation (min): 75 min    Short Term Goals: Week 1:  OT Short Term Goal 1 (Week 1): pt will be able to don pants over feet using reacher if needed with supervision. OT Short Term Goal 2 (Week 1): Pt will be able to rise to stand with min A to CGA to prepare for toileting needs. OT Short Term Goal 3 (Week 1): pt will be able to ambulate 20 ft from bed to toilet with RW with CGA.  Skilled Therapeutic Interventions/Progress Updates:    Pt received in w/c having dozed off after feeling tired from shower in earlier OT session.  Pt agreeble to trying to walk to focus on hip activity tolerance.  Pt's wife present to A with w/c follow.  Pt stated to RN he was in 5/10 pain but once he got involved in therapy it increased to 7/10.   Pt encouraged to work to his tolerance.   All sit to stands from wc close S pushing up with B hands.  Pt only able to tolerate walking 10 ft at a time today, then needing 3-4 min rest break between each walk.  Pt did 5 bouts of walking 10 ft with RW.  He continues to lean forward, so cued pt to try to lead with his heels to keep posture more upright.   Pt taken into ADL apt to practice with bed rail.  Used step stool to help pt scoot back onto bed more easily.  Pt able to move to bed and scoot back with S but he continues to have an extremely difficult time finding a method to move to supine and return to sit due to extreme R hip pain with Pressure and limited shoulder ROM due to prior shoulder issues.   Discussed waiting to try the bed mobility in apt (same height as home bed) until his pain is consistently at a 3/10 or less.  Pt returned to room and opted to lay down.   Stood with RW to pivot to bed. Mod -max A to supine to lift B legs into bed.   Pt in bed with his wife  assisting him.    Therapy Documentation Precautions:  Precautions Precautions: Fall Restrictions Weight Bearing Restrictions Per Provider Order: Yes RLE Weight Bearing Per Provider Order: Weight bearing as tolerated   Pain: Pain Assessment Pain Scale: 0-10 Pain Score: 5  Pain Type: Acute pain;Surgical pain Pain Location: Hip Pain Orientation: Right Pain Intervention(s): Medication (See eMAR) ADL: ADL Eating: Independent Grooming: Independent Upper Body Bathing: Setup Where Assessed-Upper Body Bathing: Shower Lower Body Bathing: Moderate assistance Where Assessed-Lower Body Bathing: Shower Upper Body Dressing: Minimal assistance Where Assessed-Upper Body Dressing: Wheelchair Lower Body Dressing: Maximal assistance Where Assessed-Lower Body Dressing: Wheelchair Toileting: Moderate assistance Where Assessed-Toileting: Teacher, Adult Education: Curator Method: Proofreader: Grab bars, Raised toilet seat Film/video Editor: Insurance Underwriter Method: Designer, Industrial/product: Emergency planning/management officer, Grab bars  Therapy/Group: Individual Therapy  Clint Strupp 11/09/2024, 11:19 AM

## 2024-11-10 DIAGNOSIS — R350 Frequency of micturition: Secondary | ICD-10-CM

## 2024-11-10 NOTE — Progress Notes (Signed)
 Occupational Therapy Session Note  Patient Details  Name: Duane Greene MRN: [id number] Date of Birth: 07/27/1951  Today's Date: 11/10/2024 OT Individual Time: 9154-9059 OT Individual Time Calculation (min): 55 min    Short Term Goals: Week 1:  OT Short Term Goal 1 (Week 1): pt will be able to don pants over feet using reacher if needed with supervision. OT Short Term Goal 2 (Week 1): Pt will be able to rise to stand with min A to CGA to prepare for toileting needs. OT Short Term Goal 3 (Week 1): pt will be able to ambulate 20 ft from bed to toilet with RW with CGA.  Skilled Therapeutic Interventions/Progress Updates:    Skilled OT intervention with focus on sit<>stand and standing balance to increase pt's independence with BADLs. Pt's wife present and observed therapy session. Sit<>stand x 5; initially with min A progressing to CGA. Standing activity at BITS x 5-1, 1:46, 2, 1:45, and 1. Extended rest breaks between. Pt with increasing fatigue. Pt able to stand without UE support with limited WB through RLE 2/2 pain. Pt returned to room and remained in w/c with wife present. All needs within reach.   Therapy Documentation Precautions:  Precautions Precautions: Fall Restrictions Weight Bearing Restrictions Per Provider Order: Yes RLE Weight Bearing Per Provider Order: Weight bearing as tolerated   Pain: Pt c/o 3/10 Rt hip pain; meds admin prior to therapy and rest as appropriate  Therapy/Group: Individual Therapy  [name] 11/10/2024, 10:21 AM

## 2024-11-10 NOTE — Patient Care Conference (Signed)
 Inpatient RehabilitationTeam Conference and Plan of Care Update Date: 11/10/2024   Time: 10:08 AM    Patient Name: Duane Greene      Medical Record Number: [id number]  Date of Birth: 1951-10-25 Sex: Male         Room/Bed: 4M07C/4M07C-01 Payor Info: Payor: [employer] / Plan: [employer] PPO / Product Type: *No Product type* /    Admit Date/Time:  11/04/2024  2:24 PM  Primary Diagnosis:  Septic arthritis [facility])  Hospital Problems: Principal Problem:   Septic arthritis (HCC) Active Problems:   MSSA bacteremia   Endocarditis of mitral valve    Expected Discharge Date: Expected Discharge Date: 11/19/24  Team Members Present: Physician leading conference: Dr. Murray Collier Social Worker Present: Rhoda Clement, LCSW Nurse Present: Barnie Ronde, RN PT Present: Recardo Milliner, PT;Dominic Freddi, PTA OT Present: Recardo Maxwell, OT     Current Status/Progress Goal Weekly Team Focus  Bowel/Bladder   pt incontinent of b/b   Regain continence   Time toilet q2-q4    Swallow/Nutrition/ Hydration               ADL's   mod A toileting and bathing; max A LB dressing; CGA to close S sit to stands,  min A transfers/short distance ambulation with RW,  mod A bed mobility   CGA for toileting, bathing, and LB dressing, supervision toilet transfers and tub transfers   ADL training with AE, activity tolerance, balance, pt/fam education    Mobility   Bed Mobility = modA; Transfer = min/modA from lowered bed; Amb. = up to 100' CGA RW; Stairs = CGA   supervision  barrier = pain; Focus = pt and fam ed, therex, endurance, standing balance, bed mobility    Communication                Safety/Cognition/ Behavioral Observations               Pain   Pt c/o pain in the neck 7/10, prn meds given   <3 pain score   Assess qshift and prn    Skin   Generalized bruising all over   Promote healing  Assess qshift and prn      Discharge Planning:  Home with wife  who stays here with him and has attended therapies with him. Have set up [facility] health for therapies and IV antibiotics. Await DME recommendations   Team Discussion: Patient admitted with debility post septic arthritis.  Limited by stiffness in hip, pain in right lower extremity.  Patient on target to meet rehab goals: yes, currently needs min - mod assist for bed mobility  and mod - max assist for lower body care. Able to ambulate 5-8' and needs CGA for steps. Goals for discharge set for supervision overall.  *See Care Plan and progress notes for long and short-term goals.   Revisions to Treatment Plan:  Ambulation at rail trial   Teaching Needs: Safety, medication, IV abx, transfers, toileting, etc.   Current Barriers to Discharge: Decreased caregiver support, Home enviroment access/layout, and IV antibiotics  Possible Resolutions to Barriers: Family education     Medical Summary Current Status: septic right hip, valve. pain issues. urinary frequency. htn  Barriers to Discharge: Medical stability;Infection/IV Antibiotics   Possible Resolutions to [employer]: will need to go home on iv abx, education for wife. ongoing pain control, assessing voiding patterns/pvr's   Continued Need for Acute Rehabilitation Level of Care: The patient requires daily medical management by  a physician with specialized training in physical medicine and rehabilitation for the following reasons: Direction of a multidisciplinary physical rehabilitation program to maximize functional independence : Yes Medical management of patient stability for increased activity during participation in an intensive rehabilitation regime.: Yes Analysis of laboratory values and/or radiology reports with any subsequent need for medication adjustment and/or medical intervention. : Yes   I attest that I was present, lead the team conference, and concur with the assessment and plan of the team.   Fredericka Sober B 11/10/2024, 12:52 PM

## 2024-11-10 NOTE — Progress Notes (Signed)
 Occupational Therapy Session Note  Patient Details  Name: Duane Greene MRN: [id number] Date of Birth: [birth year]  Today's Date: 11/10/2024 OT Individual Time: 1120-1200 OT Individual Time Calculation (min): 40 min    Short Term Goals: Week 1:  OT Short Term Goal 1 (Week 1): pt will be able to don pants over feet using reacher if needed with supervision. OT Short Term Goal 2 (Week 1): Pt will be able to rise to stand with min A to CGA to prepare for toileting needs. OT Short Term Goal 3 (Week 1): pt will be able to ambulate 20 ft from bed to toilet with RW with CGA.  Skilled Therapeutic Interventions/Progress Updates:    Pt received in wc in good spirits as he was feeling better and in less pain.  Session focused on activity tolerance and functional mobility.   Pt worked on sit to stands with supervision,  ambulated over 100 ft with supervision 2x.    Tried to have pt step up and down step 2x but his R knee did not feel stable today.  Had pt work on knee extension with theraband attached to w/c leg rest to strengthen quads to help with knee stability.   Asked pt and his wife to have him do this exercise and his shoulder exercises with the theraband tomorrow.   Pt is standing and walking well and wife has been very involved in his care.  Cleared wife to walk pt to bathroom on the safety plan. Pt in room with all needs met.   Therapy Documentation Precautions:  Precautions Precautions: Fall Restrictions Weight Bearing Restrictions Per Provider Order: Yes RLE Weight Bearing Per Provider Order: Weight bearing as tolerated     Pain: Pain Assessment Pain Scale: 0-10 Pain Score: 3  Pain Location: Hip Pain Intervention(s): Medication (See eMAR) ADL: ADL Eating: Independent Grooming: Independent Upper Body Bathing: Setup Where Assessed-Upper Body Bathing: Shower Lower Body Bathing: Moderate assistance Where Assessed-Lower Body Bathing: Shower Upper Body Dressing: Minimal  assistance Where Assessed-Upper Body Dressing: Wheelchair Lower Body Dressing: Maximal assistance Where Assessed-Lower Body Dressing: Wheelchair Toileting: Moderate assistance Where Assessed-Toileting: Teacher, Adult Education: Curator Method: Proofreader: Grab bars, Raised toilet seat Film/video Editor: Insurance Underwriter Method: Designer, Industrial/product: Emergency planning/management officer, Grab bars  Therapy/Group: Individual Therapy  Lalisa Kiehn 11/10/2024, 12:09 PM

## 2024-11-10 NOTE — Progress Notes (Signed)
 Patient ID: Duane Greene, male   DOB: 1950-12-24, 73 y.o.   MRN: [id number] Met with pt and wife who is present in his room to give team conference update with goals of supervision-CGA level and most dependent upon his pain level at that time. We have a target discharge date 1/2. Both pleased with having a date to work toward. Pot states:  It is the worse pain I have ever had in my life. He is doing better and seems to have turned a corner according to him and wife. Aware will do hands on education with IV antibiotics prior to discharge and wife has med tech training and they have a neighbor who is a [employer]. They have the needed equipment from past surgeries and wife has ordered a bed rail and elevated commode. Will continue to work on discharge needs.

## 2024-11-10 NOTE — Progress Notes (Signed)
 Physical Therapy Session Note  Patient Details  Name: Duane Greene MRN: [id number] Date of Birth: Mar 26, 1951  Today's Date: 11/10/2024 PT Individual Time: 9195-9155; 1036 - 1115; 1304 - 1339 PT Individual Time Calculation (min): 40 min; 39 min; 35 min   Short Term Goals: Week 1:  PT Short Term Goal 1 (Week 1): Pt will perform bed mobility w/ min A on flat bed. PT Short Term Goal 2 (Week 1): Pt will transfer sit to stand w/ min A consistently. PT Short Term Goal 3 (Week 1): Pt will amb x 50' w/ RW and CGA PT Short Term Goal 4 (Week 1): PT to assess stairs.  SESSION 2 Skilled Therapeutic Interventions/Progress Updates: Patient semi-reclined in bed with wife and nsg present providing medication on entrance to room. Patient alert and agreeable to PT session.   Patient reported 3/10 pain at rest (medicated).   Therapeutic Activity: Bed Mobility: Pt performed supine<sit on EOB with modA. VC required for sequence and use of B UE to assist with truncal elevation. Pt maxA to abduct/hip flex R LE to transition off of bed with increase in pain noted to occur.  Transfers: Pt performed sit<>stand transfers throughout session with closer to modA at beginning of session from slightly elevated bed, and CGA from toilet after having BM (elevated seated). Pt ambulated from room<toilet to have BM with light CGA for safety and wife managing IV pole. maxA posterior peri-care and pt ambulated to [facility] for end of session. PTA discussed with pt and pt wife about decreased level of assistance required for bed mobility this session vs previously observed by this PTA with bed flat vs HOB elevated. Pt and pt wife discussed looking into railing for home.   Patient sitting in WC at end of session with brakes locked, wife present, and all needs within reach.  SESSION 2 Skilled Therapeutic Interventions/Progress Updates: Patient semi-reclined in bed with wife present on entrance to room. Patient alert and agreeable to PT  session.   Patient reported unrated pain (rest breaks provided as needed).   Therapeutic Activity: Bed Mobility: Pt performed supine<sit on EOB with min/heavy minA at end of session. VC required for sequence of flexing L knee to assist with bridge to R sidelying, to flexion R hip to advance R LE off bed (done first prior to rolling to R) with minA required to assist with abducting due to increase in pain on lateral hip. Pt then cued to use UE's to assist with truncal elevation while also using B heels on EOB as leverage. Pt has noted to improve scoot to EOB with supervision. Transfers: Pt performed sit<>stand transfer with CGA  Therapeutic Exercise: Pt performed the following exercises with therapist providing the described cuing and facilitation for improvement. - Pt performed R internal rotation due to tendency to have in external rotation education provided on trying to maintain LE in neutral alignment to avoid contracture of external rotators. PTA provided rolled up blanket cobanned for pt to place when in bed on R lateral LE to assist with neutral alignment.  - R SLR on bed with cues to exhale while contracting hip flexors vs valsalva maneuver (pt and pt wife educated on rationale). Pt required minA at first, then progressed to performing without assistance. Pt cued to control eccentric. Pt also educated on rationale for SLR as this assists with bed mobility to advance R LE off of bed.  Patient sitting in WC at end of session with brakes locked, wife present and all  needs within reach.  SESSION 3 Skilled Therapeutic Interventions/Progress Updates: Patient sitting in WC on entrance to room. Patient alert and agreeable to PT session.   Patient reported 2/3 pain in R hip at rest  Therapeutic Activity: Transfers: Pt performed sit<>stand transfers throughout session with close supervision. Provided VC for hand placement.  Gait Training:  Pt ambulated from room<main gym using RW (213') with  supervision and WC follow for safety and pt requiring 2 standing resting break. Pt ambulated roughly 50' on way back to room end of session and required seated rest due to reports of increase pain in R hip (added cue to increase R internal rotation). Pt transported back to room end of session. Pt demonstrated the following gait deviations with therapist providing the described cuing and facilitation for improvement:  Pt cued to increase L step length per antalgic gait pattern on the R. Pt did not report increase in pain when increasing WB on R LE  Therapeutic Exercise: Pt performed the following exercises with therapist providing the described cuing and facilitation for improvement. - Step to 6 step with L LE and 5lb ankle weight donned in order to increase WB tolerance and strength on R LE. Pt with B UE support on first round then progressed to L UE on RW and R UE HHA. Pt presented increase in R knee buckle and required increase in rest breaks - Pt tossed bean bags to cornhole board 3 rounds with rest break and no UE support to increase WB tolerance and strength R LE. Pt with close supervision throughout.   Patient sitting in WC at end of session with brakes locked, wife present, and all needs within reach.      Therapy Documentation Precautions:  Precautions Precautions: Fall Restrictions Weight Bearing Restrictions Per Provider Order: Yes RLE Weight Bearing Per Provider Order: Weight bearing as tolerated  Therapy/Group: Individual Therapy  Onelia Cadmus PTA 11/10/2024, 10:33 AM

## 2024-11-10 NOTE — Progress Notes (Signed)
 "                                                        PROGRESS NOTE   Subjective/Complaints:  Doing well. Has pain but meds are controlling. No new issues today. Wondered about potential dc date  ROS: Patient denies fever, rash, sore throat, blurred vision, dizziness, nausea, vomiting, diarrhea, cough, shortness of breath or chest pain,  headache, or mood change.   Objective:   No results found.  Recent Labs    11/08/24 0315  WBC 9.5  HGB 12.1*  HCT 36.1*  PLT 385   Recent Labs    11/08/24 0315  NA 139  K 3.9  CL 103  CO2 28  GLUCOSE 104*  BUN 26*  CREATININE 0.65  CALCIUM 8.5*    Intake/Output Summary (Last 24 hours) at 11/10/2024 0854 Last data filed at 11/09/2024 1808 Gross per 24 hour  Intake 476 ml  Output --  Net 476 ml         Physical Exam: Vital Signs Blood pressure (!) 132/58, pulse 66, temperature 98.6 F (37 C), resp. rate 18, height 5' 7 (1.702 m), weight 91.8 kg, SpO2 95%.   Constitutional: No distress . Vital signs reviewed. HEENT: NCAT, EOMI, oral membranes moist Neck: supple Cardiovascular: RRR without murmur. No JVD    Respiratory/Chest: CTA Bilaterally without wheezes or rales. Normal effort    GI/Abdomen: BS +, non-tender, non-distended Ext: no clubbing, cyanosis, RLE  edema Psych: pleasant and cooperative  Skin: No evidence of breakdown, no evidence of rash Neurologic: Cranial nerves II through XII intact, motor strength is 5/5 in bilateral deltoid, bicep, tricep, grip,5/5  Left , 4/5 Right hip flexor, knee extensors, ankle dorsiflexor and plantar flexor Sensory exam normal sensation to light touch and proprioception in bilateral upper and lower extremities  Musculoskeletal:right hip pain still. Incision as below   Assessment/Plan: 1. Functional deficits which require 3+ hours per day of interdisciplinary therapy in a comprehensive inpatient rehab setting. Physiatrist is providing close team supervision and 24 hour  management of active medical problems listed below. Physiatrist and rehab team continue to assess barriers to discharge/monitor patient progress toward functional and medical goals  Care Tool:  Bathing    Body parts bathed by patient: Right arm, Left arm, Chest, Abdomen, Front perineal area, Face, Left upper leg, Right upper leg   Body parts bathed by helper: Right lower leg, Left lower leg, Buttocks     Bathing assist Assist Level: Moderate Assistance - Patient 50 - 74%     Upper Body Dressing/Undressing Upper body dressing   What is the patient wearing?: Pull over shirt    Upper body assist Assist Level: Contact Guard/Touching assist    Lower Body Dressing/Undressing Lower body dressing      What is the patient wearing?: Underwear/pull up, Pants     Lower body assist Assist for lower body dressing: Moderate Assistance - Patient 50 - 74%     Toileting Toileting    Toileting assist Assist for toileting: Moderate Assistance - Patient 50 - 74%     Transfers Chair/bed transfer  Transfers assist     Chair/bed transfer assist level: Contact Guard/Touching assist     Locomotion Ambulation   Ambulation assist      Assist level: Contact Guard/Touching assist Assistive  device: Walker-rolling Max distance: 110'   Walk 10 feet activity   Assist     Assist level: Contact Guard/Touching assist Assistive device: Walker-rolling   Walk 50 feet activity   Assist Walk 50 feet with 2 turns activity did not occur: Safety/medical concerns  Assist level: Contact Guard/Touching assist Assistive device: Walker-rolling    Walk 150 feet activity   Assist Walk 150 feet activity did not occur: Safety/medical concerns         Walk 10 feet on uneven surface  activity   Assist Walk 10 feet on uneven surfaces activity did not occur: Safety/medical concerns         Wheelchair     Assist Is the patient using a wheelchair?: Yes Type of Wheelchair:  Manual    Wheelchair assist level: Supervision/Verbal cueing Max wheelchair distance: 40    Wheelchair 50 feet with 2 turns activity    Assist        Assist Level: Minimal Assistance - Patient > 75%   Wheelchair 150 feet activity     Assist      Assist Level: Dependent - Patient 0%   Blood pressure (!) 132/58, pulse 66, temperature 98.6 F (37 C), resp. rate 18, height 5' 7 (1.702 m), weight 91.8 kg, SpO2 95%.  Medical Problem List and Plan: 1. Functional deficits secondary to R hip septic arthritis s/p I&D 12/14              -patient may shower with surgical sites covered             -ELOS/Goals: 10-14 days, SPV            -Continue CIR therapies including PT and OT. Interdisciplinary team conference today to discuss goals, barriers to discharge, and dc planning.    2.  Antithrombotics: -DVT/anticoagulation:  Mechanical: Sequential compression devices, below knee Bilateral lower extremities Pharmaceutical: Lovenox  40mg  daily             -antiplatelet therapy: N/A   3. Pain Management: Tylenol  1000 mg 3 times daily, gabapentin  300 mg 3 times daily, lidocaine  5% patch, Robaxin  500 mg 3 times daily.  PRN: Tylenol  650 mg and Oxycodone  5 mg q4h prn-- changed tylenol  to 500mg  BID PRN to avoid overdosing.   Change Oxy IR to Percocet 7.5 mg q4h prn  4. Mood/Behavior/Sleep: LCSW to follow for evaluation and support when available.              -antipsychotic agents: N/A  -trazodone  PRN -11/07/24 pt uses benadryl  at home, will change order so he can use this for sleep, trazodone  left as back up.    12/24 pain controlled 5. Neuropsych/cognition: This patient is capable of making decisions on his own behalf.   6. Skin/Wound Care: Routine pressure relief measures. 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 Fluids/Electrolytes/Nutrition: Monitor I&O and weight. Follow up labs. Continue vitamins/supplements.   -11/06/24 mild transaminitis at admission here, AST 86/ALT 64, Alk phos 216-- monitor, LFTs added on for Monday. Could be from terbinafine  vs tylenol     8. Right hip septic arthritis/MSSA bactermia: Afebrile, blood cultures neg from 12/14.  Per ID continue IV cefazolin  course EOT 12/12/2024. PICC line to be placed in CIR 12/18.    9. Hypertension: Monitor BP per protocol. Continue losartan -hydrochlorothiazide  50-12.5mg  daily at home.   -12/24  BPs under control Vitals:   11/06/24 2309 11/07/24 0415 11/07/24 0917 11/07/24  1343  BP: (!) 112/50 (!) 125/56 (!) 142/57 (!) 120/59   11/07/24 1951 11/08/24 0523 11/08/24 1501 11/08/24 1957  BP: (!) 125/55 124/60 (!) 111/52 (!) 132/56   11/09/24 0641 11/09/24 1444 11/09/24 2030 11/10/24 0607  BP: (!) 131/57 (!) 110/53 (!) 144/62 (!) 132/58      10.OSA: Continue CPAP nightly.    11. Hyponatremia: resolved   12. HLD: Simvastatin  40mg  nightly  13. Bowel management: colace 100mg  daily only - miralax  BID, senokot 2 tabs BID stopped -12/24 looks like bowels have normalized- had T4 last night  14. Urinary urgency/dribbling: UA neg, likely BPH, consider flomax  trial. No PVR's being checked   12/24- scan for pvr's. No caths LOS: 6 days A FACE TO FACE EVALUATION WAS PERFORMED  Arthea ONEIDA Gunther 11/10/2024, 8:54 AM     "

## 2024-11-11 LAB — BASIC METABOLIC PANEL WITH GFR
Anion gap: 7 (ref 5–15)
BUN: 24 mg/dL — ABNORMAL HIGH (ref 8–23)
CO2: 29 mmol/L (ref 22–32)
Calcium: 8.5 mg/dL — ABNORMAL LOW (ref 8.9–10.3)
Chloride: 101 mmol/L (ref 98–111)
Creatinine, Ser: 0.63 mg/dL (ref 0.61–1.24)
GFR, Estimated: >60 mL/min
Glucose, Bld: 97 mg/dL (ref 70–99)
Potassium: 3.9 mmol/L (ref 3.5–5.1)
Sodium: 137 mmol/L (ref 135–145)

## 2024-11-11 LAB — CBC WITH DIFFERENTIAL/PLATELET
Abs Immature Granulocytes: 0.15 K/uL — ABNORMAL HIGH (ref 0.00–0.07)
Basophils Absolute: 0.1 K/uL (ref 0.0–0.1)
Basophils Relative: 1 %
Eosinophils Absolute: 0.1 K/uL (ref 0.0–0.5)
Eosinophils Relative: 1 %
HCT: 35.5 % — ABNORMAL LOW (ref 39.0–52.0)
Hemoglobin: 11.9 g/dL — ABNORMAL LOW (ref 13.0–17.0)
Immature Granulocytes: 2 %
Lymphocytes Relative: 20 %
Lymphs Abs: 1.8 K/uL (ref 0.7–4.0)
MCH: 31.9 pg (ref 26.0–34.0)
MCHC: 33.5 g/dL (ref 30.0–36.0)
MCV: 95.2 fL (ref 80.0–100.0)
Monocytes Absolute: 0.9 K/uL (ref 0.1–1.0)
Monocytes Relative: 10 %
Neutro Abs: 6.1 K/uL (ref 1.7–7.7)
Neutrophils Relative %: 66 %
Platelets: 401 K/uL — ABNORMAL HIGH (ref 150–400)
RBC: 3.73 MIL/uL — ABNORMAL LOW (ref 4.22–5.81)
RDW: 13.4 % (ref 11.5–15.5)
WBC: 9.2 K/uL (ref 4.0–10.5)
nRBC: 0 % (ref 0.0–0.2)

## 2024-11-11 MED ORDER — TRAZODONE HCL 50 MG PO TABS
25.0000 mg | ORAL_TABLET | Freq: Every day | ORAL | Status: DC
  Administered 2024-11-11 – 2024-11-14 (×4): 25 mg via ORAL
  Filled 2024-11-11 (×4): qty 1

## 2024-11-11 MED ORDER — TRAZODONE HCL 50 MG PO TABS
25.0000 mg | ORAL_TABLET | Freq: Every evening | ORAL | Status: DC | PRN
  Administered 2024-11-11: 25 mg via ORAL
  Filled 2024-11-11: qty 1

## 2024-11-11 MED ORDER — DIPHENHYDRAMINE HCL 25 MG PO CAPS: 25.0000 mg | ORAL_CAPSULE | Freq: Four times a day (QID) | ORAL | Status: DC | PRN

## 2024-11-11 NOTE — Progress Notes (Addendum)
 "                                                        PROGRESS NOTE   Subjective/Complaints:  Not sleeping real well. Has received benadryl  which doesn't do much for him. Says his pain is not keeping him awake.   ROS: Patient denies fever, rash, sore throat, blurred vision, dizziness, nausea, vomiting, diarrhea, cough, shortness of breath or chest pain, joint or back/neck pain, headache, or mood change.   Objective:   No results found.  Recent Labs    11/11/24 0331  WBC 9.2  HGB 11.9*  HCT 35.5*  PLT 401*   Recent Labs    11/11/24 0331  NA 137  K 3.9  CL 101  CO2 29  GLUCOSE 97  BUN 24*  CREATININE 0.63  CALCIUM 8.5*    Intake/Output Summary (Last 24 hours) at 11/11/2024 0754 Last data filed at 11/11/2024 0700 Gross per 24 hour  Intake 236 ml  Output 200 ml  Net 36 ml         Physical Exam: Vital Signs Blood pressure 136/60, pulse 72, temperature 98.2 F (36.8 C), resp. rate 17, height 5' 7 (1.702 m), weight 91.8 kg, SpO2 96%.   Constitutional: No distress . Vital signs reviewed. HEENT: NCAT, EOMI, oral membranes moist Neck: supple Cardiovascular: RRR without murmur. No JVD    Respiratory/Chest: CTA Bilaterally without wheezes or rales. Normal effort    GI/Abdomen: BS +, non-tender, non-distended Ext: no clubbing, cyanosis, or edema Psych: pleasant and cooperative  Skin: No evidence of breakdown, no evidence of rash. Incision CDI Neurologic: Cranial nerves II through XII intact, motor strength is 5/5 in bilateral deltoid, bicep, tricep, grip,5/5  Left , 4/5 Right hip flexor, knee extensors, ankle dorsiflexor and plantar flexor Sensory exam normal sensation to light touch and proprioception in bilateral upper and lower extremities  Musculoskeletal:right hip pain still.     Assessment/Plan: 1. Functional deficits which require 3+ hours per day of interdisciplinary therapy in a comprehensive inpatient rehab setting. Physiatrist is providing close  team supervision and 24 hour management of active medical problems listed below. Physiatrist and rehab team continue to assess barriers to discharge/monitor patient progress toward functional and medical goals  Care Tool:  Bathing    Body parts bathed by patient: Right arm, Left arm, Chest, Abdomen, Front perineal area, Face, Left upper leg, Right upper leg   Body parts bathed by helper: Right lower leg, Left lower leg, Buttocks     Bathing assist Assist Level: Moderate Assistance - Patient 50 - 74%     Upper Body Dressing/Undressing Upper body dressing   What is the patient wearing?: Pull over shirt    Upper body assist Assist Level: Contact Guard/Touching assist    Lower Body Dressing/Undressing Lower body dressing      What is the patient wearing?: Underwear/pull up, Pants     Lower body assist Assist for lower body dressing: Moderate Assistance - Patient 50 - 74%     Toileting Toileting    Toileting assist Assist for toileting: Moderate Assistance - Patient 50 - 74%     Transfers Chair/bed transfer  Transfers assist     Chair/bed transfer assist level: Contact Guard/Touching assist     Locomotion Ambulation   Ambulation assist  Assist level: Contact Guard/Touching assist Assistive device: Walker-rolling Max distance: 110'   Walk 10 feet activity   Assist     Assist level: Contact Guard/Touching assist Assistive device: Walker-rolling   Walk 50 feet activity   Assist Walk 50 feet with 2 turns activity did not occur: Safety/medical concerns  Assist level: Contact Guard/Touching assist Assistive device: Walker-rolling    Walk 150 feet activity   Assist Walk 150 feet activity did not occur: Safety/medical concerns         Walk 10 feet on uneven surface  activity   Assist Walk 10 feet on uneven surfaces activity did not occur: Safety/medical concerns         Wheelchair     Assist Is the patient using a wheelchair?:  Yes Type of Wheelchair: Manual    Wheelchair assist level: Supervision/Verbal cueing Max wheelchair distance: 40    Wheelchair 50 feet with 2 turns activity    Assist        Assist Level: Minimal Assistance - Patient > 75%   Wheelchair 150 feet activity     Assist      Assist Level: Dependent - Patient 0%   Blood pressure 136/60, pulse 72, temperature 98.2 F (36.8 C), resp. rate 17, height 5' 7 (1.702 m), weight 91.8 kg, SpO2 96%.  Medical Problem List and Plan: 1. Functional deficits secondary to R hip septic arthritis s/p I&D 12/14              -patient may shower with surgical sites covered             -ELOS/Goals:  11/19/24, SPV            -Continue CIR therapies including PT, OT    2.  Antithrombotics: -DVT/anticoagulation:  Mechanical: Sequential compression devices, below knee Bilateral lower extremities Pharmaceutical: Lovenox  40mg  daily             -antiplatelet therapy: N/A   3. Pain Management: Tylenol  1000 mg 3 times daily, gabapentin  300 mg 3 times daily, lidocaine  5% patch, Robaxin  500 mg 3 times daily.  PRN: Tylenol  650 mg and Oxycodone  5 mg q4h prn-- changed tylenol  to 500mg  BID PRN to avoid overdosing.   Change Oxy IR to Percocet 7.5 mg q4h prn  4. Mood/Behavior/Sleep: LCSW to follow for evaluation and support when available.              -antipsychotic agents: N/A  -trazodone  PRN -11/07/24 pt uses benadryl  at home, will change order so he can use this for sleep, trazodone  left as back up.    12/25 benadryl  not working. He would like to try scheduled trazodone .    -will rx 25mg  at 2100 nightly with back up 25mg  if needed before midnight 5. Neuropsych/cognition: This patient is capable of making decisions on his own behalf.   6. Skin/Wound Care: Routine pressure relief measures. 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 Fluids/Electrolytes/Nutrition: Monitor I&O and weight. Follow up  labs. Continue vitamins/supplements.  -11/06/24 mild transaminitis at admission here, AST 86/ALT 64, Alk phos 216-- monitor, LFTs added on for Monday. Could be from terbinafine  vs tylenol     12/25 bmet stable today---push fluids 8. Right hip septic arthritis/MSSA bactermia: Afebrile, blood cultures neg from 12/14.  Per ID continue IV cefazolin  course EOT 12/12/2024. PICC line to be placed in CIR 12/18.    -CBC stable 12/25 9. Hypertension: Monitor BP per  protocol. Continue losartan -hydrochlorothiazide  50-12.5mg  daily at home.   -12/24-25  BPs under control Vitals:   11/07/24 1343 11/07/24 1951 11/08/24 0523 11/08/24 1501  BP: (!) 120/59 (!) 125/55 124/60 (!) 111/52   11/08/24 1957 11/09/24 0641 11/09/24 1444 11/09/24 2030  BP: (!) 132/56 (!) 131/57 (!) 110/53 (!) 144/62   11/10/24 0607 11/10/24 1436 11/10/24 2119 11/11/24 0502  BP: (!) 132/58 (!) 120/57 (!) 146/61 136/60      10.OSA: Continue CPAP nightly.    11. Hyponatremia: resolved   12. HLD: Simvastatin  40mg  nightly  13. Bowel management: colace 100mg  daily only - miralax  BID, senokot 2 tabs BID stopped -LBM 12/23  14. Urinary urgency/dribbling: UA neg, likely BPH, consider flomax  trial. No PVR's being checked  12/25 voiding continently. PVR's 0 LOS: 7 days A FACE TO FACE EVALUATION WAS PERFORMED  Duane Greene 11/11/2024, 7:54 AM     "

## 2024-11-12 MED ORDER — GABAPENTIN 100 MG PO CAPS
200.0000 mg | ORAL_CAPSULE | Freq: Three times a day (TID) | ORAL | Status: DC
  Administered 2024-11-12 – 2024-11-19 (×21): 200 mg via ORAL
  Filled 2024-11-12 (×8): qty 2

## 2024-11-12 NOTE — Progress Notes (Signed)
 Physical Therapy Session Note  Patient Details  Name: Duane Greene MRN: [id number] Date of Birth: Mar 22, 1951  Today's Date: 11/12/2024 PT Individual Time: 1330-1426 PT Individual Time Calculation (min): 56 min   Short Term Goals: Week 1:  PT Short Term Goal 1 (Week 1): Pt will perform bed mobility w/ min A on flat bed. PT Short Term Goal 2 (Week 1): Pt will transfer sit to stand w/ min A consistently. PT Short Term Goal 3 (Week 1): Pt will amb x 50' w/ RW and CGA PT Short Term Goal 4 (Week 1): PT to assess stairs.  Skilled Therapeutic Interventions/Progress Updates: Patient sitting in [facility] with wife present on entrance to room. Patient alert and agreeable to PT session.   Patient reported no pain during session  Therapeutic Activity: Transfers: Pt performed sit<>stand transfers throughout session with close supervision. Provided VC for pt to ensure safe proximity to sitting surface and to increase anterior weight shift.  TUG (avg = 39.53s) in RW with overall supervision and ending education on ensuring back of knees touch sitting surface to avoid increase risk of fall. Pt also self corrected upright posture  - 45.79s  - 36.99s  - 35.81s  Five times Sit to Stand Test (FTSS) Method: Use a straight back chair with a solid seat that is 16-18 high. Ask participant to sit on the chair with arms folded across their chest.   Instructions: Stand up and sit down as quickly as possible 5 times, keeping your arms folded across your chest.   Measurement: Stop timing when the participant stands the 5th time.   TIME: 35.86 (in seconds) - 41.71s - 35.56s - 30.30s   Times > 13.6 seconds is associated with increased disability and morbidity (Guralnik, 2000) Times > 15 seconds is predictive of recurrent falls in healthy individuals aged 18 and older (Buatois, et al., 2008) Normal performance values in community dwelling individuals aged 66 and older (Bohannon, 2006): 60-69 years: 11.4  seconds 70-79 years: 12.6 seconds 80-89 years: 14.8 seconds   MCID: >= 2.3 seconds for Vestibular Disorders (Meretta, 2006)  Therapeutic Exercise: Pt performed the following exercises with therapist providing the described cuing and facilitation for improvement. - Step over 6hurdle with L LE only to improve WB tolerance on R LE and B UE support on RW. Pt performed x 3 rounds with rest break required and overall close supervision   Patient sitting in WC at end of session with brakes locked, wife present, and all needs within reach.      Therapy Documentation Precautions:  Precautions Precautions: Fall Restrictions Weight Bearing Restrictions Per Provider Order: Yes RLE Weight Bearing Per Provider Order: Weight bearing as tolerated   Therapy/Group: Individual Therapy  Alquan Morrish A Fajr Fife 11/12/2024, 2:28 PM

## 2024-11-12 NOTE — Progress Notes (Signed)
 Physical Therapy Weekly Progress Note  Patient Details  Name: Duane Greene MRN: [id number] Date of Birth: 1951/03/08  Beginning of progress report period: November 05, 2024 End of progress report period: November 12, 2024  Patient has met 4 of 4 short term goals. Pt has met all STG's and is on track to meet LTG's set forth by evaluating PT. Pt initially limited by increased pain in R hip during functional activities, and has demonstrated ability to increase tolerance to interventions with decrease in overall pain. Pt currently performed bed mobility with CGA/supervision (on L side of bed), ambulates 150'+ with RW (occasionally requires seated rest depending on pain) and has performed stair navigation with CGA and use of railing. Pt is supervision with sit<>stand transfers as well and only requires supervision in RW. Pt wife has been present during PT sessions and is engaged in pt's overall care.   Patient continues to demonstrate the following deficits {impairments:3041632} and therefore will continue to benefit from skilled PT intervention to increase functional independence with mobility.  Patient {LTG progression:3041653}.  {plan of rjmz:6958345}  PT Short Term Goals Week 1:  PT Short Term Goal 1 (Week 1): Pt will perform bed mobility w/ min A on flat bed. PT Short Term Goal 1 - Progress (Week 1): Met PT Short Term Goal 2 (Week 1): Pt will transfer sit to stand w/ min A consistently. PT Short Term Goal 2 - Progress (Week 1): Met PT Short Term Goal 3 (Week 1): Pt will amb x 50' w/ RW and CGA PT Short Term Goal 3 - Progress (Week 1): Met PT Short Term Goal 4 (Week 1): PT to assess stairs. PT Short Term Goal 4 - Progress (Week 1): Met  Skilled Therapeutic Interventions/Progress Updates:      Therapy Documentation Precautions:  Precautions Precautions: Fall Restrictions Weight Bearing Restrictions Per Provider Order: Yes RLE Weight Bearing Per Provider Order: Weight bearing as  tolerated  Edynn Gillock PTA   11/12/2024, 3:11 PM

## 2024-11-12 NOTE — Plan of Care (Signed)
" °  Problem: Consults Goal: RH GENERAL PATIENT EDUCATION Description: See Patient Education module for education specifics. Outcome: Progressing   Problem: RH BOWEL ELIMINATION Goal: RH STG MANAGE BOWEL WITH ASSISTANCE Description: STG Manage Bowel with mod I Assistance. Outcome: Progressing Goal: RH STG MANAGE BOWEL W/MEDICATION W/ASSISTANCE Description: STG Manage Bowel with Medication with mod I Assistance. Outcome: Progressing   Problem: RH SAFETY Goal: RH STG ADHERE TO SAFETY PRECAUTIONS W/ASSISTANCE/DEVICE Description: STG Adhere to Safety Precautions With cues  Assistance/Device. Outcome: Progressing   Problem: RH PAIN MANAGEMENT Goal: RH STG PAIN MANAGED AT OR BELOW PT'S PAIN GOAL Description: Pain < 4 with prns Outcome: Progressing   Problem: RH KNOWLEDGE DEFICIT GENERAL Goal: RH STG INCREASE KNOWLEDGE OF SELF CARE AFTER HOSPITALIZATION Description: Patient and wife will be able to manage care at discharge using educational resources for medications skin care, dietary modification independently Outcome: Progressing   "

## 2024-11-12 NOTE — Progress Notes (Signed)
 Occupational Therapy Weekly Progress Note  Patient Details  Name: Duane Greene MRN: [id number] Date of Birth: 1951-09-18  Beginning of progress report period: November 05, 2024 End of progress report period: November 12, 2024  Today's Date: 11/12/2024    Patient has met 3 of 3 short term goals.  Pt currently requires supervision - MIN A for ADLs. Pt perfers to have his wife assist with ADLS but wife reports she does help him don pants on RLE. Pt completes ambulatory ADL transfers with RW with supervision-CGA. Pt completes bathing with MIN A- supervision pending pain. Pt on target for DC on 1/2. Pts wife has been present during most sessions.   Patient continues to demonstrate the following deficits: muscle weakness, decreased cardiorespiratoy endurance, and decreased sitting balance, decreased standing balance, and difficulty maintaining precautions and therefore will continue to benefit from skilled OT intervention to enhance overall performance with BADL and Reduce care partner burden.  Patient progressing toward long term goals..  Continue plan of care.  OT Short Term Goals Week 1:  OT Short Term Goal 1 (Week 1): pt will be able to don pants over feet using reacher if needed with supervision. OT Short Term Goal 1 - Progress (Week 1): Met OT Short Term Goal 2 (Week 1): Pt will be able to rise to stand with min A to CGA to prepare for toileting needs. OT Short Term Goal 2 - Progress (Week 1): Met OT Short Term Goal 3 (Week 1): pt will be able to ambulate 20 ft from bed to toilet with RW with CGA. OT Short Term Goal 3 - Progress (Week 1): Met Week 2:  OT Short Term Goal 1 (Week 2): STG=LTG    Therapy Documentation Precautions:  Precautions Precautions: Fall Restrictions Weight Bearing Restrictions Per Provider Order: Yes RLE Weight Bearing Per Provider Order: Weight bearing as tolerated    Therapy/Group: Individual Therapy  [name] 11/12/2024, 7:16 AM

## 2024-11-12 NOTE — Progress Notes (Signed)
 Occupational Therapy Session Note  Patient Details  Name: Duane Greene MRN: [id number] Date of Birth: [birth year]  Today's Date: 11/12/2024 OT Individual Time: 0850-1001 session 1 OT Individual Time Calculation (min): 71 min  Session 2: 1050-1202   Short Term Goals: Week 1:  OT Short Term Goal 1 (Week 1): pt will be able to don pants over feet using reacher if needed with supervision. OT Short Term Goal 1 - Progress (Week 1): Met OT Short Term Goal 2 (Week 1): Pt will be able to rise to stand with min A to CGA to prepare for toileting needs. OT Short Term Goal 2 - Progress (Week 1): Met OT Short Term Goal 3 (Week 1): pt will be able to ambulate 20 ft from bed to toilet with RW with CGA. OT Short Term Goal 3 - Progress (Week 1): Met  Skilled Therapeutic Interventions/Progress Updates:  Session 1: Pt greeted seated in w/c, pt agreeable to OT intervention.    Unrated pain reported at R hip, rest breaks provided as needed.   Transfers/bed mobility/functional mobility:  Pt completed sit>stands throuhout session with supervision with RW. Pt completed functional ambulation greater than a household distance with Rw and CGA.   Practiced bed mobility on mat table with pt having to exit on R side, pt needed MODA to complete task. Discussed exiting bed at home on L side to decrease pain and caregiver burden. Pt receptive to recommendation. Plan to practice in later session today.    Exercises:  Pt completed various exercises from supine on mat to strengthen core for bed mobility. Pt first instructed to reach diagonally from supine to facilitate improved oblique strength and to target upper abs for improve core stability during functional mobility tasks. Pt completed all tasks with great effort but supervision overall, min verbal/tactile cues needed to complete task.   Pt completed same reaching task as above, but this time with resistance band around each UE. Pt completed task with supervision.  MIN verbal/tactile cues needed for technique.   Pt completed supine OH reaches with pt instructed to simulate as if he were climbing up a rope with a focus on lifting one scapula up off the mat at a time to target rectus abdominis. Pt completed x20 reps.   Pt completed supine ball tosses to therapist with a focus on completing modified  crunch to toss the ball. Pt completed x10 reps.    Pt complete seated modified crunches from EOM with different variations of weight added to UB to increase challenge and core strength. Pt completed task with supervision.                  Ended session with pt seated in w/c with all needs within reach.        Session 2: Pt greeted supine in bed, pt agreeable to OT intervention.    Unrated pain reported in R hip, rest breaks provided as needed.   Transfers/bed mobility/functional mobility:  Practiced getting OOB on L side of bed as pt struggles with getting OOB on R side d/t hip pain. Pt completed bed mobility to L side with supervision, increased time but no physical help!  Pt completed functional ambulation ~ 50 ft with RW and CGA, w/c follow provided.     Exercises:  Pt completed 5 mins on kientron for quad strengthening/endurance.   Pt completed below supine exercises from mat for LB strengthening and to provide AROM for R hip: X10 hip knee flexion  X10  hip abd/add  X10 straight leg raise X10 glute bridges X10 isometric hold of glute bridge with hip abd/add  Pt completed below standing therex with BUE support for LB strengthening and to facilitate improved standing balance for ADL participation:   X10 RLE hip abd/add  X10 RLE marches to target on railing X10 RLE glute kickbacks  Attempted same set of exercises on LLE however pt presents with buckling in R knee, terminated task for safety.   Pt instructed to step over yoga block with RLE with BUE support on RW to simulate navigating home thresholds. Pt completed x 5 reps with CGA.                   Ended session with pt seated in w/c with all needs within reach.   Therapy Documentation Precautions:  Precautions Precautions: Fall Restrictions Weight Bearing Restrictions Per Provider Order: Yes RLE Weight Bearing Per Provider Order: Weight bearing as tolerated    Therapy/Group: Individual Therapy  [name] 11/12/2024, 12:11 PM

## 2024-11-12 NOTE — Progress Notes (Addendum)
 "                                                        PROGRESS NOTE   Subjective/Complaints:  No issues overnite , pain controlled with perocet 2.5mg  as well as Acetaminophen   Sleep improved with trazodone  25mg  scheduled  Reviewed chart not on gabapentin  at home   ROS: Patient denies fever, rash, sore throat, blurred vision, dizziness, nausea, vomiting, diarrhea, cough, shortness of breath or chest pain, joint or back/neck pain, headache, or mood change.   Objective:   No results found.  Recent Labs    11/11/24 0331  WBC 9.2  HGB 11.9*  HCT 35.5*  PLT 401*   Recent Labs    11/11/24 0331  NA 137  K 3.9  CL 101  CO2 29  GLUCOSE 97  BUN 24*  CREATININE 0.63  CALCIUM 8.5*    Intake/Output Summary (Last 24 hours) at 11/12/2024 0852 Last data filed at 11/12/2024 0803 Gross per 24 hour  Intake 2397 ml  Output 110 ml  Net 2287 ml         Physical Exam: Vital Signs Blood pressure 133/64, pulse 68, temperature 98.3 F (36.8 C), resp. rate 17, height 5' 7 (1.702 m), weight 91.8 kg, SpO2 96%.   General: No acute distress Mood and affect are appropriate Heart: Regular rate and rhythm no rubs murmurs or extra sounds Lungs: Clear to auscultation, breathing unlabored, no rales or wheezes Abdomen: Positive bowel sounds, soft nontender to palpation, nondistended Extremities: No clubbing, cyanosis, or edema  Skin: No evidence of breakdown, no evidence of rash. Incision CDI Neurologic: Cranial nerves II through XII intact, motor strength is 5/5 in bilateral deltoid, bicep, tricep, grip,5/5  Left , (pain inhibition) 3-/5 Right hip flexor, knee extensors,4  ankle dorsiflexor and plantar flexor Sensory exam normal sensation to light touch and proprioception in bilateral upper and lower extremities  Musculoskeletal:right hip pain still.     Assessment/Plan: 1. Functional deficits which require 3+ hours per day of interdisciplinary therapy in a comprehensive inpatient  rehab setting. Physiatrist is providing close team supervision and 24 hour management of active medical problems listed below. Physiatrist and rehab team continue to assess barriers to discharge/monitor patient progress toward functional and medical goals  Care Tool:  Bathing    Body parts bathed by patient: Right arm, Left arm, Chest, Abdomen, Front perineal area, Face, Left upper leg, Right upper leg   Body parts bathed by helper: Right lower leg, Left lower leg, Buttocks     Bathing assist Assist Level: Moderate Assistance - Patient 50 - 74%     Upper Body Dressing/Undressing Upper body dressing   What is the patient wearing?: Pull over shirt    Upper body assist Assist Level: Contact Guard/Touching assist    Lower Body Dressing/Undressing Lower body dressing      What is the patient wearing?: Underwear/pull up, Pants     Lower body assist Assist for lower body dressing: Moderate Assistance - Patient 50 - 74%     Toileting Toileting    Toileting assist Assist for toileting: Moderate Assistance - Patient 50 - 74%     Transfers Chair/bed transfer  Transfers assist     Chair/bed transfer assist level: Contact Guard/Touching assist     Locomotion Ambulation   Ambulation  assist      Assist level: Contact Guard/Touching assist Assistive device: Walker-rolling Max distance: 110'   Walk 10 feet activity   Assist     Assist level: Contact Guard/Touching assist Assistive device: Walker-rolling   Walk 50 feet activity   Assist Walk 50 feet with 2 turns activity did not occur: Safety/medical concerns  Assist level: Contact Guard/Touching assist Assistive device: Walker-rolling    Walk 150 feet activity   Assist Walk 150 feet activity did not occur: Safety/medical concerns         Walk 10 feet on uneven surface  activity   Assist Walk 10 feet on uneven surfaces activity did not occur: Safety/medical concerns          Wheelchair     Assist Is the patient using a wheelchair?: Yes Type of Wheelchair: Manual    Wheelchair assist level: Supervision/Verbal cueing Max wheelchair distance: 40    Wheelchair 50 feet with 2 turns activity    Assist        Assist Level: Minimal Assistance - Patient > 75%   Wheelchair 150 feet activity     Assist      Assist Level: Dependent - Patient 0%   Blood pressure 133/64, pulse 68, temperature 98.3 F (36.8 C), resp. rate 17, height 5' 7 (1.702 m), weight 91.8 kg, SpO2 96%.  Medical Problem List and Plan: 1. Functional deficits secondary to R hip septic arthritis s/p I&D 12/14              -patient may shower with surgical sites covered             -ELOS/Goals:  11/19/24, SPV            -Continue CIR therapies including PT, OT    2.  Antithrombotics: -DVT/anticoagulation:  Mechanical: Sequential compression devices, below knee Bilateral lower extremities Pharmaceutical: Lovenox  40mg  daily             -antiplatelet therapy: N/A   3. Pain Management: Tylenol  1000 mg 3 times daily, gabapentin  300 mg 3 times daily, lidocaine  5% patch, Robaxin  500 mg 3 times daily.  PRN: Tylenol  650 mg and Oxycodone  5 mg q4h prn-- changed tylenol  to 500mg  BID PRN to avoid overdosing.   Change Oxy IR to Percocet 7.5 mg q4h prn  4. Mood/Behavior/Sleep: LCSW to follow for evaluation and support when available.              -antipsychotic agents: N/A  -trazodone  PRN -11/07/24 pt uses benadryl  at home, will change order so he can use this for sleep, trazodone  left as back up.    12/25 benadryl  not working. He would like to try scheduled trazodone .    -will rx 25mg  at 2100 nightly with back up 25mg  if needed before midnight 5. Neuropsych/cognition: This patient is capable of making decisions on his own behalf.   6. Skin/Wound Care: Routine pressure relief measures. Terbinafine  250mg  weekly for fungal infx R hip incision CDI, 8d post op removed Post op dressing     7. Fluids/Electrolytes/Nutrition: Monitor I&O and weight. Follow up labs. Continue vitamins/supplements.  -11/06/24 mild transaminitis at admission here, AST 86/ALT 64, Alk phos 216-- monitor, LFTs added on for Monday. Could be from terbinafine  vs tylenol     12/25 bmet stable today---push fluids 8. Right hip septic arthritis/MSSA bactermia: Afebrile, blood cultures neg from 12/14.  Per ID continue IV cefazolin  course EOT 12/12/2024. PICC line to be placed in CIR 12/18.    -CBC stable  12/25 9. Hypertension: Monitor BP per protocol. Continue losartan -hydrochlorothiazide  50-12.5mg  daily at home.   -12/26 BPs under control Vitals:   11/08/24 0523 11/08/24 1501 11/08/24 1957 11/09/24 0641  BP: 124/60 (!) 111/52 (!) 132/56 (!) 131/57   11/09/24 1444 11/09/24 2030 11/10/24 0607 11/10/24 1436  BP: (!) 110/53 (!) 144/62 (!) 132/58 (!) 120/57   11/10/24 2119 11/11/24 0502 11/11/24 1932 11/12/24 0548  BP: (!) 146/61 136/60 (!) 143/59 133/64      10.OSA: Continue CPAP nightly.    11. Hyponatremia: resolved   12. HLD: Simvastatin  40mg  nightly  13. Bowel management: colace 100mg  daily only - miralax  BID, senokot 2 tabs BID stopped -LBM 12/23  14. Urinary urgency/dribbling: UA neg, likely BPH, consider flomax  trial. No PVR's being checked  12/25 voiding continently. PVR's 0 12/26 has been using urinal but decided to let it go into diaper this am   discussed infection risk of soiled diaper  LOS: 8 days A FACE TO FACE EVALUATION WAS PERFORMED  Duane Greene 11/12/2024, 8:52 AM     "

## 2024-11-13 NOTE — Progress Notes (Signed)
 Occupational Therapy Session Note  Patient Details  Name: Duane Greene MRN: [id number] Date of Birth: 1951-02-25  Today's Date: 11/13/2024 OT Individual Time: 1450-1533 OT Individual Time Calculation (min): 43 min    Short Term Goals: Week 2:  OT Short Term Goal 1 (Week 2): STG=LTG  Skilled Therapeutic Interventions/Progress Updates:  Skilled OT session completed to address ADL retraining. Pt received seated in [facility] with wife present, agreeable to participate in therapy. Pt reports no pain.  Pt requesting to shower. Pt completed functional mobility with RW to shower CGA when navigating incline OT covered pic line sight prior to shower. Pt doffed UB clothing with supervision seated at TTB. Pt doffed LB clothing with CGA standing at RW. Pt completed shower level ADL with CGA standing intermittently to perform posterior peri care. Pt completed functional mobility with RW CGA to WC. Pt required Min A to dry for thoroughness at feet. Pt completed LB dressing with reacher Min A to manage strapping of brief, pt standing at RW to pull pants up over bottom. UB dressing with Touch A to manage pic line, wife and OT collaborated on modifying shirt for pic line mgmt. Pt seated in [facility] with wife present and all needs in reach.   Therapy Documentation Precautions:  Precautions Precautions: Fall Restrictions Weight Bearing Restrictions Per Provider Order: Yes RLE Weight Bearing Per Provider Order: Weight bearing as tolerated   Therapy/Group: Individual Therapy  [name], MS, OTR/L 11/13/2024, 7:57 AM

## 2024-11-13 NOTE — Progress Notes (Signed)
 Physical Therapy Session Note  Patient Details  Name: Duane Greene MRN: [id number] Date of Birth: December 30, 1950  Today's Date: 11/13/2024 PT Individual Time: 0815-0903 PT Individual Time Calculation (min): 48 min   Short Term Goals: Week 1:  PT Short Term Goal 1 (Week 1): Pt will perform bed mobility w/ min A on flat bed. PT Short Term Goal 1 - Progress (Week 1): Met PT Short Term Goal 2 (Week 1): Pt will transfer sit to stand w/ min A consistently. PT Short Term Goal 2 - Progress (Week 1): Met PT Short Term Goal 3 (Week 1): Pt will amb x 50' w/ RW and CGA PT Short Term Goal 3 - Progress (Week 1): Met PT Short Term Goal 4 (Week 1): PT to assess stairs. PT Short Term Goal 4 - Progress (Week 1): Met  Skilled Therapeutic Interventions/Progress Updates: Patient semi-reclined in bed with wife present and request to have extra time to wake up. Upon re-entry, pt sitting EOB with wife assisting to change brief. Patient alert and agreeable to PT session.   Patient with no major complaints of pain (stated it was tolerable in R hip and wanted to hold off on receiving pain medication).  Therapeutic Activity: Transfers: Pt performed sit<>stand transfers throughout session with supervision and use of RW. Provided VC for hand placement. Pt performed mass practice of sitting edge of mat and flexing/abducting R LE onto mat to carryover towards bed mobility (R LE leading). Pt cued to lean posteriorly with B UE stabilizing trunk to assist with increasing leverage and hip flexor stretch for improved strength to advance onto mat. Supervision/CGA throughout.   Gait Training:  Pt ambulated from gym<room using RW with supervision and hinted cue on what pt needs to do to improve gait cycle. Pt stated stand tall with another hinted cue required for pt to recall increasing L step length. Pt required seated rest in [facility] and then ambulated rest of way back to room in RW. PTA managed IV pole while wife managed WC.   Therapeutic Exercise: Pt performed the following exercises with therapist providing the described cuing and facilitation for improvement. - B LAQ 5lb ankle weight donned and VC to breathe accordingly and to control eccentric. 3 x 10 with rest break required.   Patient sitting in WC at end of session with brakes locked, wife present, and all needs within reach.      Therapy Documentation Precautions:  Precautions Precautions: Fall Restrictions Weight Bearing Restrictions Per Provider Order: Yes RLE Weight Bearing Per Provider Order: Weight bearing as tolerated   Therapy/Group: Individual Therapy  Torion Hulgan PTA 11/13/2024, 11:18 AM

## 2024-11-13 NOTE — Progress Notes (Signed)
 Occupational Therapy Session Note  Patient Details  Name: Duane Greene MRN: [id number] Date of Birth: 1951/11/02  Today's Date: 11/13/2024 OT Individual Time: 1000-1115 OT Individual Time Calculation (min): 75 min    Short Term Goals: Week 1:  OT Short Term Goal 1 (Week 1): pt will be able to don pants over feet using reacher if needed with supervision. OT Short Term Goal 1 - Progress (Week 1): Met OT Short Term Goal 2 (Week 1): Pt will be able to rise to stand with min A to CGA to prepare for toileting needs. OT Short Term Goal 2 - Progress (Week 1): Met OT Short Term Goal 3 (Week 1): pt will be able to ambulate 20 ft from bed to toilet with RW with CGA. OT Short Term Goal 3 - Progress (Week 1): Met  Skilled Therapeutic Interventions/Progress Updates:     Initial Encounter: At the time of arrival, the patient  was seated at w/c LOF with his wife present. The pt indicated that he rested well during the night with a pain response of a 3 on 0-10 for  his R hip. The patient indicated that he hadn't reported his pain to nursing. The pt was in agreement with completing sit to stands, AROM with the RLE, and simulated task in LB dressing.   UB/LB Strengthing: The pt went on to start  with UB strengthening using a 3llb dumb bell for 2 sets of 10 bicep curls, shld flexion, horizontal abduction, and elbow extension with rest breaks as needed. The pt went on to complete sit to stands incorporating the arm of the w/c and the RW for a count of 10, 4x's with rest breaks as needed, the pt required 2 rest breaks. The pt was able to come from sit to stand for completing the UB cycle, placed on his bedside table, for a total of 15 minutes in duration with 2 rest breaks.  The pt  completed the session performing  a simulated task in LB dressing  3x's using theraband  and the long handle reacher for opposite hand, opposite leg for > range. At the end on the session, the patient remained at w/c LOF with the call  light and bedside table within reach and all additional needs addressed.    Therapy Documentation Precautions:  Precautions Precautions: Fall Restrictions Weight Bearing Restrictions Per Provider Order: Yes RLE Weight Bearing Per Provider Order: Weight bearing as tolerated   Therapy/Group: Individual Therapy  [name] 11/13/2024, 11:45 AM

## 2024-11-13 NOTE — Plan of Care (Signed)
" °  Problem: Consults Goal: RH GENERAL PATIENT EDUCATION Description: See Patient Education module for education specifics. Outcome: Progressing   Problem: RH BOWEL ELIMINATION Goal: RH STG MANAGE BOWEL WITH ASSISTANCE Description: STG Manage Bowel with mod I Assistance. Outcome: Progressing Goal: RH STG MANAGE BOWEL W/MEDICATION W/ASSISTANCE Description: STG Manage Bowel with Medication with mod I Assistance. Outcome: Progressing   Problem: RH SAFETY Goal: RH STG ADHERE TO SAFETY PRECAUTIONS W/ASSISTANCE/DEVICE Description: STG Adhere to Safety Precautions With cues  Assistance/Device. Outcome: Progressing   Problem: RH PAIN MANAGEMENT Goal: RH STG PAIN MANAGED AT OR BELOW PT'S PAIN GOAL Description: Pain < 4 with prns Outcome: Progressing   Problem: RH KNOWLEDGE DEFICIT GENERAL Goal: RH STG INCREASE KNOWLEDGE OF SELF CARE AFTER HOSPITALIZATION Description: Patient and wife will be able to manage care at discharge using educational resources for medications skin care, dietary modification independently Outcome: Progressing   "

## 2024-11-13 NOTE — Progress Notes (Signed)
 "                                                        PROGRESS NOTE   Subjective/Complaints:  Pt doing well, slept well, pain well managed overall, LBM just now, urinating ok but still having urgency mostly at night. No other complaints or concerns.   ROS: as per HPI. Denies CP, SOB, abd pain, N/V/D/C, or any other complaints at this time.    Objective:   No results found.  Recent Labs    11/11/24 0331  WBC 9.2  HGB 11.9*  HCT 35.5*  PLT 401*   Recent Labs    11/11/24 0331  NA 137  K 3.9  CL 101  CO2 29  GLUCOSE 97  BUN 24*  CREATININE 0.63  CALCIUM 8.5*    Intake/Output Summary (Last 24 hours) at 11/13/2024 1046 Last data filed at 11/13/2024 0831 Gross per 24 hour  Intake 426.33 ml  Output --  Net 426.33 ml         Physical Exam: Vital Signs Blood pressure (!) 128/54, pulse 74, temperature (!) 97.3 F (36.3 C), temperature source Oral, resp. rate 18, height 5' 7 (1.702 m), weight 91.8 kg, SpO2 94%.   General: No acute distress, sitting in w/c, comfortable appearing.  Mood and affect are appropriate Heart: Regular rate and rhythm no rubs murmurs or extra sounds Lungs: Clear to auscultation, breathing unlabored, no rales or wheezes Abdomen: Positive bowel sounds, soft nontender to palpation, nondistended Extremities: No clubbing, cyanosis, or edema  PRIOR EXAMS: Skin: No evidence of breakdown, no evidence of rash. Incision CDI Neurologic: Cranial nerves II through XII intact, motor strength is 5/5 in bilateral deltoid, bicep, tricep, grip,5/5  Left , (pain inhibition) 3-/5 Right hip flexor, knee extensors,4  ankle dorsiflexor and plantar flexor Sensory exam normal sensation to light touch and proprioception in bilateral upper and lower extremities  Musculoskeletal:right hip pain still.     Assessment/Plan: 1. Functional deficits which require 3+ hours per day of interdisciplinary therapy in a comprehensive inpatient rehab setting. Physiatrist is  providing close team supervision and 24 hour management of active medical problems listed below. Physiatrist and rehab team continue to assess barriers to discharge/monitor patient progress toward functional and medical goals  Care Tool:  Bathing    Body parts bathed by patient: Right arm, Left arm, Chest, Abdomen, Front perineal area, Face, Left upper leg, Right upper leg   Body parts bathed by helper: Right lower leg, Left lower leg, Buttocks     Bathing assist Assist Level: Moderate Assistance - Patient 50 - 74%     Upper Body Dressing/Undressing Upper body dressing   What is the patient wearing?: Pull over shirt    Upper body assist Assist Level: Contact Guard/Touching assist    Lower Body Dressing/Undressing Lower body dressing      What is the patient wearing?: Underwear/pull up, Pants     Lower body assist Assist for lower body dressing: Moderate Assistance - Patient 50 - 74%     Toileting Toileting    Toileting assist Assist for toileting: Moderate Assistance - Patient 50 - 74%     Transfers Chair/bed transfer  Transfers assist     Chair/bed transfer assist level: Contact Guard/Touching assist     Locomotion Ambulation   Ambulation  assist      Assist level: Contact Guard/Touching assist Assistive device: Walker-rolling Max distance: 110'   Walk 10 feet activity   Assist     Assist level: Contact Guard/Touching assist Assistive device: Walker-rolling   Walk 50 feet activity   Assist Walk 50 feet with 2 turns activity did not occur: Safety/medical concerns  Assist level: Contact Guard/Touching assist Assistive device: Walker-rolling    Walk 150 feet activity   Assist Walk 150 feet activity did not occur: Safety/medical concerns         Walk 10 feet on uneven surface  activity   Assist Walk 10 feet on uneven surfaces activity did not occur: Safety/medical concerns         Wheelchair     Assist Is the patient  using a wheelchair?: Yes Type of Wheelchair: Manual    Wheelchair assist level: Supervision/Verbal cueing Max wheelchair distance: 40    Wheelchair 50 feet with 2 turns activity    Assist        Assist Level: Minimal Assistance - Patient > 75%   Wheelchair 150 feet activity     Assist      Assist Level: Dependent - Patient 0%   Blood pressure (!) 128/54, pulse 74, temperature (!) 97.3 F (36.3 C), temperature source Oral, resp. rate 18, height 5' 7 (1.702 m), weight 91.8 kg, SpO2 94%.  Medical Problem List and Plan: 1. Functional deficits secondary to R hip septic arthritis s/p I&D 12/14              -patient may shower with surgical sites covered             -ELOS/Goals:  11/19/24, SPV            -Continue CIR therapies including PT, OT    2.  Antithrombotics: -DVT/anticoagulation:  Mechanical: Sequential compression devices, below knee Bilateral lower extremities Pharmaceutical: Lovenox  40mg  daily             -antiplatelet therapy: N/A   3. Pain Management: Tylenol  1000 mg 3 times daily, gabapentin  300 mg 3 times daily, lidocaine  5% patch, Robaxin  500 mg 3 times daily.  PRN: Tylenol  650 mg and Oxycodone  5 mg q4h prn-- changed tylenol  to 500mg  BID PRN to avoid overdosing.   -Change Oxy IR to Percocet 7.5 mg q4h prn-- as of 12/27 has oxy IR 2.5mg  q4h prn, percocet 5-325 q4h prn, tylenol  500mg  BID PRN 4. Mood/Behavior/Sleep: LCSW to follow for evaluation and support when available.              -antipsychotic agents: N/A  -trazodone  PRN -11/07/24 pt uses benadryl  at home, will change order so he can use this for sleep, trazodone  left as back up.    12/25 benadryl  not working. He would like to try scheduled trazodone .  -will rx 25mg  at 2100 nightly with back up 25mg  if needed before midnight 5. Neuropsych/cognition: This patient is capable of making decisions on his own behalf.   6. Skin/Wound Care: Routine pressure relief measures. Terbinafine  250mg  weekly for  fungal infx -R hip incision CDI, 8d post op removed Post op dressing    7. Fluids/Electrolytes/Nutrition: Monitor I&O and weight. Follow up labs. Continue vitamins/supplements.  -11/06/24 mild transaminitis at admission here, AST 86/ALT 64, Alk phos 216-- monitor, LFTs added on for Monday. Could be from terbinafine  vs tylenol     12/25 bmet stable today---push fluids  -11/13/24 LFTs still elevated on 12/22, check LFTs again on Monday  8. Right  hip septic arthritis/MSSA bactermia: Afebrile, blood cultures neg from 12/14.  Per ID continue IV cefazolin  course EOT 12/12/2024. PICC line to be placed in CIR 12/18.    -CBC stable 12/25  9. Hypertension: Monitor BP per protocol. Continue losartan -hydrochlorothiazide  50-12.5mg  daily at home.   -12/26-27 BPs under control Vitals:   11/09/24 0641 11/09/24 1444 11/09/24 2030 11/10/24 0607  BP: (!) 131/57 (!) 110/53 (!) 144/62 (!) 132/58   11/10/24 1436 11/10/24 2119 11/11/24 0502 11/11/24 1932  BP: (!) 120/57 (!) 146/61 136/60 (!) 143/59   11/12/24 0548 11/12/24 1609 11/12/24 2029 11/13/24 0554  BP: 133/64 120/60 (!) 131/56 (!) 128/54      10.OSA: Continue CPAP nightly.    11. Hyponatremia: resolved   12. HLD: Simvastatin  40mg  nightly  13. Bowel management: colace 100mg  daily only - miralax  BID, senokot 2 tabs BID changed to PRN -LBM 12/27  14. Urinary urgency/dribbling: -UA neg, likely BPH, consider flomax  trial. No PVR's being checked  -12/25 voiding continently. PVR's 0 -12/26 has been using urinal but decided to let it go into diaper this am   discussed infection risk of soiled diaper  -11/13/24 discussed options of meds, hold off for now, might f/up with urology outpatient  LOS: 9 days A FACE TO [facility] EVALUATION WAS PERFORMED  [address] 11/13/2024, 10:46 AM     "

## 2024-11-14 NOTE — Plan of Care (Signed)
" °  Problem: Consults Goal: RH GENERAL PATIENT EDUCATION Description: See Patient Education module for education specifics. Outcome: Progressing   Problem: RH BOWEL ELIMINATION Goal: RH STG MANAGE BOWEL WITH ASSISTANCE Description: STG Manage Bowel with mod I Assistance. Outcome: Progressing Goal: RH STG MANAGE BOWEL W/MEDICATION W/ASSISTANCE Description: STG Manage Bowel with Medication with mod I Assistance. Outcome: Progressing   Problem: RH SAFETY Goal: RH STG ADHERE TO SAFETY PRECAUTIONS W/ASSISTANCE/DEVICE Description: STG Adhere to Safety Precautions With cues  Assistance/Device. Outcome: Progressing   Problem: RH PAIN MANAGEMENT Goal: RH STG PAIN MANAGED AT OR BELOW PT'S PAIN GOAL Description: Pain < 4 with prns Outcome: Progressing   Problem: RH KNOWLEDGE DEFICIT GENERAL Goal: RH STG INCREASE KNOWLEDGE OF SELF CARE AFTER HOSPITALIZATION Description: Patient and wife will be able to manage care at discharge using educational resources for medications skin care, dietary modification independently Outcome: Progressing   "

## 2024-11-14 NOTE — Progress Notes (Signed)
 Occupational Therapy Session Note  Patient Details  Name: Duane Greene MRN: [id number] Date of Birth: 18-Aug-1951  Today's Date: 11/14/2024 OT Individual Time: 0915-1030 OT Individual Time Calculation (min): 75 min    Short Term Goals: Week 1:  OT Short Term Goal 1 (Week 1): pt will be able to don pants over feet using reacher if needed with supervision. OT Short Term Goal 1 - Progress (Week 1): Met OT Short Term Goal 2 (Week 1): Pt will be able to rise to stand with min A to CGA to prepare for toileting needs. OT Short Term Goal 2 - Progress (Week 1): Met OT Short Term Goal 3 (Week 1): pt will be able to ambulate 20 ft from bed to toilet with RW with CGA. OT Short Term Goal 3 - Progress (Week 1): Met  Skilled Therapeutic Interventions/Progress Updates:    Initial Encounter:  Patient seated at w/c LOF with family present at the time of treatment. The pt reported a pain response of 4 on 0-10 for R LE. The pt went on to say that nursing was able to provide medication to address his pain. The pt reported  having challenges resting during the night. The pt was in agreement with working on UB strength to improve upon functional Ind.   UB Exercise: The pt was transported to the gym and was able to complete the [employer] for in duration. The pt  went on to complete sit to stands using the RW and the arm of the w/c 4x for a count of 10 with 3 restbreaks. The pt went on to complete UB exercises using the 3lb dumb bell 2 sets of 13 for bicep curl, shld flexion, horizontal abduction, and elbow extension.  The pt required 3 rest breaks. The pt was transported to his room and was able to complete UB exercises using the 1lb dowel 2 sets of 13 for shld rotation and lg circles with 2 rest breaks.  BADL Related Task:  The pt completed the session with performing a simulated task in LB dressing  using theraband for threading his feet into the opening and bring the band over his hips and bottome 3x  at [facility]  with rest breaks as needed, the pt required 1 rest breaks.  At the end of the session, the pt remained at w/c LOF with the call light and bedside table within reach. The patients feet were supported. All additional needs were addressed prior to me exiting the room.   Therapy Documentation Precautions:  Precautions Precautions: Fall Restrictions Weight Bearing Restrictions Per Provider Order: Yes RLE Weight Bearing Per Provider Order: Weight bearing as tolerated  Therapy/Group: Individual Therapy  [name] 11/14/2024, 6:24 PM

## 2024-11-14 NOTE — Progress Notes (Signed)
 Physical Therapy Session Note  Patient Details  Name: Duane Greene MRN: [id number] Date of Birth: 1951/06/24  Today's Date: 11/14/2024 PT Individual Time: 0200-0245 PT Individual Time Calculation (min): 45 min   Short Term Goals: Week 1:  PT Short Term Goal 1 (Week 1): Pt will perform bed mobility w/ min A on flat bed. PT Short Term Goal 1 - Progress (Week 1): Met PT Short Term Goal 2 (Week 1): Pt will transfer sit to stand w/ min A consistently. PT Short Term Goal 2 - Progress (Week 1): Met PT Short Term Goal 3 (Week 1): Pt will amb x 50' w/ RW and CGA PT Short Term Goal 3 - Progress (Week 1): Met PT Short Term Goal 4 (Week 1): PT to assess stairs. PT Short Term Goal 4 - Progress (Week 1): Met  Skilled Therapeutic Interventions/Progress Updates:     Pt received sitting in wheelchair with IV pole attached. Pt agreeable to PT tx and reports no pain. Pt reports fatigue.  Therex: Gt with RW CGA x 24 ft with pt reports of fatigue but able to perform with good cadence and control. STS x 5 from w/c CGA to SBA. Squats with usage of RW x 15 with good quality control. LAQ x 15; iso LAQ with ankle pump x 15 with sustained hold x 1-2 sec each for each pump for calf stretching; seated windshield wiper x 15 each LE. Standing UE flex/ext x 1 minute with PT CGA for safety; standing alternating shoulder punches x 1 min with PT Cga for safety; standing EC x 1 min without UE A with PT light CGA with minimal sway. Pt limited by fatigue from therapies today; did not want to walk longer than the above.   Pt left in wheelchair; no complaints of pain, all needs within reach.   Therapy Documentation Precautions:  Precautions Precautions: Fall Restrictions Weight Bearing Restrictions Per Provider Order: Yes RLE Weight Bearing Per Provider Order: Weight bearing as tolerated   Therapy/Group: Individual Therapy  [name] 11/14/2024, 7:17 AM

## 2024-11-14 NOTE — Progress Notes (Signed)
Patient placed himself on home CPAP unit for the night.  

## 2024-11-14 NOTE — Progress Notes (Signed)
 Occupational Therapy Session Note  Patient Details  Name: Teyon L Geiselman MRN: [id number] Date of Birth: [birth year]  {CHL IP REHAB OT TIME CALCULATIONS:[id number]}   Short Term Goals: Week 2:  OT Short Term Goal 1 (Week 2): STG=LTG  Skilled Therapeutic Interventions/Progress Updates:    Patient agreeable to participate in OT session. Reports *** pain level.   Patient participated in skilled OT session focusing on ***. Therapist facilitated/assessed/developed/educated/integrated/elicited *** in order to improve/facilitate/promote    Therapy Documentation Precautions:  Precautions Precautions: Fall Restrictions Weight Bearing Restrictions Per Provider Order: Yes RLE Weight Bearing Per Provider Order: Weight bearing as tolerated   Therapy/Group: Individual Therapy  Leita Howell, OTR/L,CBIS  Supplemental OT - [facility] and [facility] Secure Chat Preferred   11/14/2024, 9:19 PM

## 2024-11-14 NOTE — Progress Notes (Signed)
 "                                                        PROGRESS NOTE   Subjective/Complaints:  Pt doing ok but didn't sleep well d/t sweating a lot last night. Will try using a towel under him tonight to see if he'll stay more dry. No fevers or chills though.  Pain well managed overall, LBM yesterday, urinating ok about the same as he has been. No other complaints or concerns.   ROS: as per HPI. Denies CP, SOB, abd pain, N/V/D/C, or any other complaints at this time.    Objective:   No results found.  No results for input(s): WBC, HGB, HCT, PLT in the last 72 hours.  No results for input(s): NA, K, CL, CO2, GLUCOSE, BUN, CREATININE, CALCIUM in the last 72 hours.   Intake/Output Summary (Last 24 hours) at 11/14/2024 1114 Last data filed at 11/14/2024 0827 Gross per 24 hour  Intake 720 ml  Output --  Net 720 ml         Physical Exam: Vital Signs Blood pressure (!) 111/54, pulse 65, temperature 98.1 F (36.7 C), resp. rate 18, height 5' 7 (1.702 m), weight 91.8 kg, SpO2 97%.   General: No acute distress, sitting in w/c working in OT, comfortable appearing.  Mood and affect are appropriate Heart: Regular rate and rhythm no rubs murmurs or extra sounds Lungs: Clear to auscultation, breathing unlabored, no rales or wheezes Abdomen: Positive bowel sounds, soft nontender to palpation, nondistended Extremities: No clubbing, cyanosis, or edema  PRIOR EXAMS: Skin: No evidence of breakdown, no evidence of rash. Incision CDI Neurologic: Cranial nerves II through XII intact, motor strength is 5/5 in bilateral deltoid, bicep, tricep, grip,5/5  Left , (pain inhibition) 3-/5 Right hip flexor, knee extensors,4  ankle dorsiflexor and plantar flexor Sensory exam normal sensation to light touch and proprioception in bilateral upper and lower extremities  Musculoskeletal:right hip pain still.     Assessment/Plan: 1. Functional deficits which require 3+ hours  per day of interdisciplinary therapy in a comprehensive inpatient rehab setting. Physiatrist is providing close team supervision and 24 hour management of active medical problems listed below. Physiatrist and rehab team continue to assess barriers to discharge/monitor patient progress toward functional and medical goals  Care Tool:  Bathing    Body parts bathed by patient: Right arm, Left arm, Chest, Abdomen, Front perineal area, Face, Left upper leg, Right upper leg   Body parts bathed by helper: Right lower leg, Left lower leg, Buttocks     Bathing assist Assist Level: Moderate Assistance - Patient 50 - 74%     Upper Body Dressing/Undressing Upper body dressing   What is the patient wearing?: Pull over shirt    Upper body assist Assist Level: Contact Guard/Touching assist    Lower Body Dressing/Undressing Lower body dressing      What is the patient wearing?: Underwear/pull up, Pants     Lower body assist Assist for lower body dressing: Moderate Assistance - Patient 50 - 74% (ModA for simulated task in LB dressing using theraband and the RW for donning the theraband over his hips and bottom.)     Toileting Toileting    Toileting assist Assist for toileting: Moderate Assistance - Patient 50 - 74%  Transfers Chair/bed transfer  Transfers assist     Chair/bed transfer assist level: Supervision/Verbal cueing     Locomotion Ambulation   Ambulation assist      Assist level: Supervision/Verbal cueing Assistive device: Walker-rolling Max distance: 150'+   Walk 10 feet activity   Assist     Assist level: Supervision/Verbal cueing Assistive device: Walker-rolling   Walk 50 feet activity   Assist Walk 50 feet with 2 turns activity did not occur: Safety/medical concerns  Assist level: Supervision/Verbal cueing Assistive device: Walker-rolling    Walk 150 feet activity   Assist Walk 150 feet activity did not occur: Safety/medical  concerns  Assist level: Supervision/Verbal cueing Assistive device: Walker-rolling    Walk 10 feet on uneven surface  activity   Assist Walk 10 feet on uneven surfaces activity did not occur: Safety/medical concerns         Wheelchair     Assist Is the patient using a wheelchair?: Yes Type of Wheelchair: Manual    Wheelchair assist level: Supervision/Verbal cueing Max wheelchair distance: 40    Wheelchair 50 feet with 2 turns activity    Assist        Assist Level: Minimal Assistance - Patient > 75%   Wheelchair 150 feet activity     Assist      Assist Level: Dependent - Patient 0%   Blood pressure (!) 111/54, pulse 65, temperature 98.1 F (36.7 C), resp. rate 18, height 5' 7 (1.702 m), weight 91.8 kg, SpO2 97%.  Medical Problem List and Plan: 1. Functional deficits secondary to R hip septic arthritis s/p I&D 12/14              -patient may shower with surgical sites covered             -ELOS/Goals:  11/19/24, SPV-- d/c goal 1/2            -Continue CIR therapies including PT, OT    2.  Antithrombotics: -DVT/anticoagulation:  Mechanical: Sequential compression devices, below knee Bilateral lower extremities Pharmaceutical: Lovenox  40mg  daily             -antiplatelet therapy: N/A   3. Pain Management: Tylenol  1000 mg 3 times daily, gabapentin  300 mg 3 times daily, lidocaine  5% patch, Robaxin  500 mg 3 times daily.  PRN: Tylenol  650 mg and Oxycodone  5 mg q4h prn-- changed tylenol  to 500mg  BID PRN to avoid overdosing.   -Change Oxy IR to Percocet 7.5 mg q4h prn-- as of 12/27 has oxy IR 2.5mg  q4h prn, percocet 5-325 q4h prn, tylenol  500mg  BID PRN  4. Mood/Behavior/Sleep: LCSW to follow for evaluation and support when available.              -antipsychotic agents: N/A  -trazodone  PRN -11/07/24 pt uses benadryl  at home, will change order so he can use this for sleep, trazodone  left as back up.    12/25 benadryl  not working. He would like to try  scheduled trazodone .  -will rx 25mg  at 2100 nightly with back up 25mg  if needed before midnight  5. Neuropsych/cognition: This patient is capable of making decisions on his own behalf.   6. Skin/Wound Care: Routine pressure relief measures. Terbinafine  250mg  weekly for fungal infx -R hip incision CDI, 8d post op removed Post op dressing    7. Fluids/Electrolytes/Nutrition: Monitor I&O and weight. Follow up labs. Continue vitamins/supplements.  -11/06/24 mild transaminitis at admission here, AST 86/ALT 64, Alk phos 216-- monitor, LFTs added on for Monday. Could be from  terbinafine  vs tylenol     12/25 bmet stable today---push fluids  -11/13/24 LFTs still elevated on 12/22, check LFTs again on Monday  8. Right hip septic arthritis/MSSA bactermia: Afebrile, blood cultures neg from 12/14.  Per ID continue IV cefazolin  course EOT 12/12/2024. PICC line to be placed in CIR 12/18.    -CBC stable 12/25  9. Hypertension: Monitor BP per protocol. Continue losartan -hydrochlorothiazide  50-12.5mg  daily at home.  -11/14/24 BPs a little soft, but fairly stable. Might need to reduce meds if continued lower reads Vitals:   11/10/24 0607 11/10/24 1436 11/10/24 2119 11/11/24 0502  BP: (!) 132/58 (!) 120/57 (!) 146/61 136/60   11/11/24 1932 11/12/24 0548 11/12/24 1609 11/12/24 2029  BP: (!) 143/59 133/64 120/60 (!) 131/56   11/13/24 0554 11/13/24 1618 11/13/24 2012 11/14/24 0607  BP: (!) 128/54 (!) 116/56 (!) 129/54 (!) 111/54      10.OSA: Continue CPAP nightly.    11. Hyponatremia: resolved   12. HLD: Simvastatin  40mg  nightly  13. Bowel management: colace 100mg  daily only - miralax  BID, senokot 2 tabs BID changed to PRN -LBM 12/27  14. Urinary urgency/dribbling: -UA neg, likely BPH, consider flomax  trial. No PVR's being checked  -12/25 voiding continently. PVR's 0 -12/26 has been using urinal but decided to let it go into diaper this am   discussed infection risk of soiled diaper  -11/13/24  discussed options of meds, hold off for now, might f/up with urology outpatient  LOS: 10 days A FACE TO [facility] EVALUATION WAS PERFORMED  [address] 11/14/2024, 11:14 AM     "

## 2024-11-14 NOTE — Progress Notes (Signed)
 Physical Therapy Session Note  Patient Details  Name: Duane Greene MRN: [id number] Date of Birth: 26-Sep-1951  Today's Date: 11/14/2024 PT Individual Time: 8952-8841 PT Individual Time Calculation (min): 71 min   Short Term Goals: Week 1:  PT Short Term Goal 1 (Week 1): Pt will perform bed mobility w/ min A on flat bed. PT Short Term Goal 1 - Progress (Week 1): Met PT Short Term Goal 2 (Week 1): Pt will transfer sit to stand w/ min A consistently. PT Short Term Goal 2 - Progress (Week 1): Met PT Short Term Goal 3 (Week 1): Pt will amb x 50' w/ RW and CGA PT Short Term Goal 3 - Progress (Week 1): Met PT Short Term Goal 4 (Week 1): PT to assess stairs. PT Short Term Goal 4 - Progress (Week 1): Met  Skilled Therapeutic Interventions/Progress Updates:     Pt seated in [facility] upon arrival, wife in room sleeping. Pt reports 3/10 pain in R hip, premedicated. Agreeable to therapy. Session emphasized functional strengthening, endurance/activity tolerance, standing balance/NMR, and tolerance with R LE WB during ambulation, transfers, and stair negotiation. Pt practiced car transfer using RW with CGA for R LE into/out of car. Pt amb toward main gym ~75 ft using RW with close supervision before requiring rest break due to fatigue. Pt asc/desc 4-6 inch steps x 2 trials with heavy use of B HR with CGA going up and light CGA coming down. Pt required seated rest break between trials due to fatigue. Pt able to recall correct sequencing without cueing. Pt instructed in and performed the following standing exercises with B UE support for improved B LE strength and endurance: 1x10 mini squats, 2x10 hip abduction with 2# ankle weights, and 1x10 marching on R LE/1x5 marching on L LE due to weakness/pain in R LE. Pt required CGA to min A during exercises and manual blocking of R knee at times to prevent buckling. Pt required seated rest breaks throughout session due to fatigue and pain. Once back in room, pt remained  seated in [facility], wife present, and all needs in reach at end of session.  Therapy Documentation Precautions:  Precautions Precautions: Fall Restrictions Weight Bearing Restrictions Per Provider Order: Yes RLE Weight Bearing Per Provider Order: Weight bearing as tolerated  Therapy/Group: Individual Therapy  [name], PT, DPT 11/14/2024, 7:32 AM

## 2024-11-15 LAB — BASIC METABOLIC PANEL WITH GFR
Anion gap: 8 (ref 5–15)
BUN: 25 mg/dL — ABNORMAL HIGH (ref 8–23)
CO2: 29 mmol/L (ref 22–32)
Calcium: 8.9 mg/dL (ref 8.9–10.3)
Chloride: 100 mmol/L (ref 98–111)
Creatinine, Ser: 0.64 mg/dL (ref 0.61–1.24)
GFR, Estimated: >60 mL/min
Glucose, Bld: 106 mg/dL — ABNORMAL HIGH (ref 70–99)
Potassium: 3.8 mmol/L (ref 3.5–5.1)
Sodium: 137 mmol/L (ref 135–145)

## 2024-11-15 LAB — CBC WITH DIFFERENTIAL/PLATELET
Abs Immature Granulocytes: 0.08 K/uL — ABNORMAL HIGH (ref 0.00–0.07)
Basophils Absolute: 0.1 K/uL (ref 0.0–0.1)
Basophils Relative: 1 %
Eosinophils Absolute: 0.2 K/uL (ref 0.0–0.5)
Eosinophils Relative: 2 %
HCT: 34.7 % — ABNORMAL LOW (ref 39.0–52.0)
Hemoglobin: 11.8 g/dL — ABNORMAL LOW (ref 13.0–17.0)
Immature Granulocytes: 1 %
Lymphocytes Relative: 23 %
Lymphs Abs: 1.8 K/uL (ref 0.7–4.0)
MCH: 32.8 pg (ref 26.0–34.0)
MCHC: 34 g/dL (ref 30.0–36.0)
MCV: 96.4 fL (ref 80.0–100.0)
Monocytes Absolute: 1 K/uL (ref 0.1–1.0)
Monocytes Relative: 12 %
Neutro Abs: 4.9 K/uL (ref 1.7–7.7)
Neutrophils Relative %: 61 %
Platelets: 334 K/uL (ref 150–400)
RBC: 3.6 MIL/uL — ABNORMAL LOW (ref 4.22–5.81)
RDW: 13.3 % (ref 11.5–15.5)
WBC: 8 K/uL (ref 4.0–10.5)
nRBC: 0 % (ref 0.0–0.2)

## 2024-11-15 LAB — HEPATIC FUNCTION PANEL
ALT: 44 U/L (ref 0–44)
AST: 63 U/L — ABNORMAL HIGH (ref 15–41)
Albumin: 2.8 g/dL — ABNORMAL LOW (ref 3.5–5.0)
Alkaline Phosphatase: 176 U/L — ABNORMAL HIGH (ref 38–126)
Bilirubin, Direct: 0.2 mg/dL (ref 0.0–0.2)
Indirect Bilirubin: 0.3 mg/dL (ref 0.3–0.9)
Total Bilirubin: 0.5 mg/dL (ref 0.0–1.2)
Total Protein: 7.2 g/dL (ref 6.5–8.1)

## 2024-11-15 MED ORDER — CEFAZOLIN IV (FOR PTA / DISCHARGE USE ONLY): 2.0000 g | Freq: Three times a day (TID) | INTRAVENOUS | 0 refills | Status: AC

## 2024-11-15 MED ORDER — TRAZODONE HCL 50 MG PO TABS
50.0000 mg | ORAL_TABLET | Freq: Every day | ORAL | Status: DC
  Administered 2024-11-15 – 2024-11-18 (×4): 50 mg via ORAL
  Filled 2024-11-15 (×4): qty 1

## 2024-11-15 MED ORDER — DICLOFENAC SODIUM 1 % EX GEL
2.0000 g | Freq: Four times a day (QID) | CUTANEOUS | Status: DC
  Administered 2024-11-15 – 2024-11-19 (×13): 2 g via TOPICAL
  Filled 2024-11-15: qty 100

## 2024-11-15 NOTE — Progress Notes (Signed)
 Orthopedic Tech Progress Note Patient Details:  Duane Greene March 12, 1951 [id number]  Ortho Devices Type of Ortho Device: Knee Sleeve Ortho Device/Splint Location: RLE Ortho Device/Splint Interventions: Ordered, Application  Fitted patient with a LARGE knee sleeve. XL was a little to big so I did let patient and wife know if he feels like he wants the bigger size let me know. Large fit him well  Post Interventions Patient Tolerated: Well Instructions Provided: Care of device  [name] Pac 11/15/2024, 8:48 AM

## 2024-11-15 NOTE — Progress Notes (Signed)
 Physical Therapy Session Note  Patient Details  Name: Duane Greene MRN: [id number] Date of Birth: January 14, 1951  Today's Date: 11/15/2024 PT Individual Time: 0920-0945 PT Individual Time Calculation (min): 25 min   Short Term Goals: Week 1:  PT Short Term Goal 1 (Week 1): Pt will perform bed mobility w/ min A on flat bed. PT Short Term Goal 1 - Progress (Week 1): Met PT Short Term Goal 2 (Week 1): Pt will transfer sit to stand w/ min A consistently. PT Short Term Goal 2 - Progress (Week 1): Met PT Short Term Goal 3 (Week 1): Pt will amb x 50' w/ RW and CGA PT Short Term Goal 3 - Progress (Week 1): Met PT Short Term Goal 4 (Week 1): PT to assess stairs. PT Short Term Goal 4 - Progress (Week 1): Met   Skilled Therapeutic Interventions/Progress Updates:    Pt presents in room in [facility], agreeable to PT. Pt reports pain in R knee, states having received neoprene sleeve this AM from ortho tech but concerned about size due to being tight. Therapist adjusts and knee brace noted to be too tight. Therapist reached out to MD for new order for XL knee brace. Pt transported to day room dependently for time management, completes stand step transfer to EOM. Pt then ambualtes 87' with RW with CGA/close supervision with therapist managing IV, pt wife bringing WC to manage for pt fatigue. Pt requires seated rest break following gait trial, does report pain in knee with weightbearing. Therapist provides education on hip strength and knee strength for reducing knee pain. Pt instructed in completing LAQs outside of therapy sessions to decrease stiffness, improve mobility, and increase quad strength with pt verbalizing understanding. Pt returns to room and remains seated in [facility] with all needs within reach, cal light in place at end of session.   Therapy Documentation Precautions:  Precautions Precautions: Fall Restrictions Weight Bearing Restrictions Per Provider Order: Yes RLE Weight Bearing Per Provider Order:  Weight bearing as tolerated  Therapy/Group: Individual Therapy  Reche Ohara PT, DPT 11/15/2024, 4:36 PM

## 2024-11-15 NOTE — Progress Notes (Signed)
 Occupational Therapy Session Note  Patient Details  Name: Duane Greene MRN: [id number] Date of Birth: 1951/07/26  Today's Date: 11/15/2024 OT Individual Time: 1006-1101+1322-1349 OT Individual Time Calculation (min): 55 min  18 mins missed for toileting   Short Term Goals: Week 2:  OT Short Term Goal 1 (Week 2): STG=LTG  Skilled Therapeutic Interventions/Progress Updates:  Session 1: Pt greeted seated in w/c, pt agreeable to OT intervention.    Unrated pain reported during session, rest breaks provided as needed.   Transfers/bed mobility/functional mobility:  Pt completed all sit>stands with RW with supervision. Pt completed all stand pivot transfers with RW and supervision.   ADLs:   Transfers: talked through shower transfer at home to walkin shower. Pt able to step backwards over shower frame and pivot to simulated corner seat with RW and CGA. Wife able to return demo appropriate level of assist. Discussed recommendation of grab bars to be installed and use of hh shower head.    Exercises:  Pt completed various core exercises from EOM with a focus on improving oblique and deep intrinsic core strength. Pt completed all therex with supervision for safety and MIN - mod verbal cues for set- up and technique. Pt needed 2-3 rest breaks during 3 intervals.                 Ended session with pt seated in w/c with all needs within reach and wife present.         Session 2: Pt greeted in pursuit to bathroom with wife assist, ( wife is checked off on transfers), pt requested time to toilet. Returned 18 mins later with pt agreeable to OT intervention. No pain reported during session.   Transfers/bed mobility/functional mobility:  Pt completed all sit>stands and stand pivot transfers with RW and supervision.    Exercises:  Worked on core strength from EOM with pt instructed to toss ball to rebounder and then complete modified crunch. Pt completed 2x1 min of task for improved core  strength and endurance. Pt completed task with supervision.   Pt completed Standing balance task with pt instructed to stand with no UE support while tossing ball to rebounder to challenge reactive balance. Pt completed task with CGA, no LOB.   Worked on seated hip/knee flexion to simulate elevating RLE over car floor board or tub shower from TTB. Pt completed x10 reps with great effort but overall supervision, MIN verbal cues for set- up and technique.  Pt completed x5 sit>stands from EOM while holding ball to challenge LB strength, pt completed task with great effort but CGA.                  Passed pt off directly to PT for next session.               Therapy Documentation Precautions:  Precautions Precautions: Fall Restrictions Weight Bearing Restrictions Per Provider Order: Yes RLE Weight Bearing Per Provider Order: Weight bearing as tolerated    Therapy/Group: Individual Therapy  [name] 11/15/2024, 12:20 PM

## 2024-11-15 NOTE — Progress Notes (Signed)
 "                                                        PROGRESS NOTE   Subjective/Complaints:  No issues overnite except cont poor sleep discussed increased trazodone  RIght knee pain , hx OA not using diclofenac  gel ,  would consider sleeve  ROS: as per HPI. Denies CP, SOB, abd pain, N/V/D/C, or any other complaints at this time.    Objective:   No results found.  Recent Labs    11/15/24 0416  WBC 8.0  HGB 11.8*  HCT 34.7*  PLT 334    Recent Labs    11/15/24 0416  NA 137  K 3.8  CL 100  CO2 29  GLUCOSE 106*  BUN 25*  CREATININE 0.64  CALCIUM 8.9     Intake/Output Summary (Last 24 hours) at 11/15/2024 0804 Last data filed at 11/15/2024 0753 Gross per 24 hour  Intake 1320 ml  Output --  Net 1320 ml         Physical Exam: Vital Signs Blood pressure (!) 129/52, pulse 75, temperature 98.6 F (37 C), temperature source Oral, resp. rate 18, height 5' 7 (1.702 m), weight 91.8 kg, SpO2 94%.   General: No acute distress, sitting in w/c working in OT, comfortable appearing.  Mood and affect are appropriate Heart: Regular rate and rhythm no rubs murmurs or extra sounds Lungs: Clear to auscultation, breathing unlabored, no rales or wheezes Abdomen: Positive bowel sounds, soft nontender to palpation, nondistended Extremities: No clubbing, cyanosis, or edema  PRIOR EXAMS: Skin: No evidence of breakdown, no evidence of rash. Incision CDI Neurologic: Cranial nerves II through XII intact, motor strength is 5/5 in bilateral deltoid, bicep, tricep, grip,5/5  Left , (pain inhibition) 3-/5 Right hip flexor, knee extensors,4  ankle dorsiflexor and plantar flexor Sensory exam normal sensation to light touch and proprioception in bilateral upper and lower extremities  Musculoskeletal:right hip pain still.     Assessment/Plan: 1. Functional deficits which require 3+ hours per day of interdisciplinary therapy in a comprehensive inpatient rehab setting. Physiatrist is  providing close team supervision and 24 hour management of active medical problems listed below. Physiatrist and rehab team continue to assess barriers to discharge/monitor patient progress toward functional and medical goals  Care Tool:  Bathing    Body parts bathed by patient: Right arm, Left arm, Chest, Abdomen, Front perineal area, Face, Left upper leg, Right upper leg   Body parts bathed by helper: Right lower leg, Left lower leg, Buttocks     Bathing assist Assist Level: Moderate Assistance - Patient 50 - 74%     Upper Body Dressing/Undressing Upper body dressing   What is the patient wearing?: Pull over shirt    Upper body assist Assist Level: Contact Guard/Touching assist    Lower Body Dressing/Undressing Lower body dressing      What is the patient wearing?: Underwear/pull up, Pants     Lower body assist Assist for lower body dressing: Moderate Assistance - Patient 50 - 74% (during simulated task using theraband)     Toileting Toileting    Toileting assist Assist for toileting: Moderate Assistance - Patient 50 - 74%     Transfers Chair/bed transfer  Transfers assist     Chair/bed transfer assist level: Supervision/Verbal cueing  Locomotion Ambulation   Ambulation assist      Assist level: Supervision/Verbal cueing Assistive device: Walker-rolling Max distance: 150'+   Walk 10 feet activity   Assist     Assist level: Supervision/Verbal cueing Assistive device: Walker-rolling   Walk 50 feet activity   Assist Walk 50 feet with 2 turns activity did not occur: Safety/medical concerns  Assist level: Supervision/Verbal cueing Assistive device: Walker-rolling    Walk 150 feet activity   Assist Walk 150 feet activity did not occur: Safety/medical concerns  Assist level: Supervision/Verbal cueing Assistive device: Walker-rolling    Walk 10 feet on uneven surface  activity   Assist Walk 10 feet on uneven surfaces activity did  not occur: Safety/medical concerns         Wheelchair     Assist Is the patient using a wheelchair?: Yes Type of Wheelchair: Manual    Wheelchair assist level: Supervision/Verbal cueing Max wheelchair distance: 40    Wheelchair 50 feet with 2 turns activity    Assist        Assist Level: Minimal Assistance - Patient > 75%   Wheelchair 150 feet activity     Assist      Assist Level: Dependent - Patient 0%   Blood pressure (!) 129/52, pulse 75, temperature 98.6 F (37 C), temperature source Oral, resp. rate 18, height 5' 7 (1.702 m), weight 91.8 kg, SpO2 94%.  Medical Problem List and Plan: 1. Functional deficits secondary to R hip septic arthritis s/p I&D 12/14              -patient may shower with surgical sites covered             -ELOS/Goals:  11/19/24, SPV-- d/c goal 1/2            -Continue CIR therapies including PT, OT    2.  Antithrombotics: -DVT/anticoagulation:  Mechanical: Sequential compression devices, below knee Bilateral lower extremities Pharmaceutical: Lovenox  40mg  daily             -antiplatelet therapy: N/A   3. Pain Management: Tylenol  1000 mg 3 times daily, gabapentin  300 mg 3 times daily, lidocaine  5% patch, Robaxin  500 mg 3 times daily.  PRN: Tylenol  650 mg and Oxycodone  5 mg q4h prn-- changed tylenol  to 500mg  BID PRN to avoid overdosing.   -Change Oxy IR to Percocet 7.5 mg q4h prn-- as of 12/27 but has only used 2.5 mg daily in addition to scheduled tylenol  Add diclofenac  gel for RIght knee pain   4. Mood/Behavior/Sleep: LCSW to follow for evaluation and support when available.              -antipsychotic agents: N/A  -trazodone  PRN -11/07/24 pt uses benadryl  at home, will increase trazodone  ot 50mg     5. Neuropsych/cognition: This patient is capable of making decisions on his own behalf.   6. Skin/Wound Care: Routine pressure relief measures. Terbinafine  250mg  weekly for fungal infx -R hip incision CDI, 8d post op removed Post  op dressing    7. Fluids/Electrolytes/Nutrition: Monitor I&O and weight. Follow up labs. Continue vitamins/supplements.  -11/06/24 mild transaminitis at admission here, AST 86/ALT 64, Alk phos 216-- monitor, LFTs added on for Monday. Could be from terbinafine  vs tylenol     12/25 bmet stable today---push fluids  -11/13/24 LFTs still elevated on 12/22, check LFTs again on Monday  8. Right hip septic arthritis/MSSA bactermia: Afebrile, blood cultures neg from 12/14.  Per ID continue IV cefazolin  course EOT 12/12/2024. PICC line to  be placed in CIR 12/18.    -CBC stable 12/25  9. Hypertension: Monitor BP per protocol. Continue losartan -hydrochlorothiazide  50-12.5mg  daily at home.  -11/14/24 BPs a little soft, but fairly stable. Might need to reduce meds if continued lower reads Vitals:   11/11/24 0502 11/11/24 1932 11/12/24 0548 11/12/24 1609  BP: 136/60 (!) 143/59 133/64 120/60   11/12/24 2029 11/13/24 0554 11/13/24 1618 11/13/24 2012  BP: (!) 131/56 (!) 128/54 (!) 116/56 (!) 129/54   11/14/24 0607 11/14/24 1530 11/14/24 2001 11/15/24 0507  BP: (!) 111/54 122/60 (!) 118/54 (!) 129/52      10.OSA: Continue CPAP nightly.    11. Hyponatremia: resolved   12. HLD: Simvastatin  40mg  nightly  13. Bowel management: colace 100mg  daily only - miralax  BID, senokot 2 tabs BID changed to PRN -LBM 12/27  14. Urinary urgency/dribbling: -UA neg, likely BPH, no current c/os, occ incont  LOS: 11 days A FACE TO FACE EVALUATION WAS PERFORMED  Duane Greene 11/15/2024, 8:04 AM     "

## 2024-11-15 NOTE — Progress Notes (Signed)
 Physical Therapy Session Note  Patient Details  Name: Duane Greene MRN: [id number] Date of Birth: [birth year]  Today's Date: 11/15/2024 PT Individual Time: 1345-1430 PT Individual Time Calculation (min): 45 min   Short Term Goals: Week 2:     Skilled Therapeutic Interventions/Progress Updates:    Pt recd in gym as hand off from OT, No complaint of pain at rest but up to 4/10 with mobility, rest and positioning as needed. Pt propelled w/c with BUE ~120 ft, transported remaining distance for time.  Pt participated in standing cornhole activity for standing tolerance and standing balance. Progressed to squatting to retrieve bean bags from low surface, pt able to perform 6-8 before needing rest break for fatigue and pain management. Supervision-CGA throughout with UUE support on RW. Pt then stood without UE support to clean bean bags with supervision.   Pt then ambulated ~40 ft back toward room with close supervision, transported remaining distance d/t fatigue. Pt remained in w/c, was left with all needs in reach and alarm active.   Therapy Documentation Precautions:  Precautions Precautions: Fall Restrictions Weight Bearing Restrictions Per Provider Order: Yes RLE Weight Bearing Per Provider Order: Weight bearing as tolerated General:     Therapy/Group: Individual Therapy  Schuyler JAYSON Batter 11/15/2024, 2:11 PM

## 2024-11-16 NOTE — Progress Notes (Addendum)
 "                                                        PROGRESS NOTE   Subjective/Complaints:  No issues overnite except cont poor sleep discussed increased trazodone  RIght knee pain , large sleeve did not fit yesterday , now using diclofenac  gel Slept a bit better on Trazodone  50mg    ROS: as per HPI. Denies CP, SOB, abd pain, N/V/D/C, or any other complaints at this time.    Objective:   No results found.  Recent Labs    11/15/24 0416  WBC 8.0  HGB 11.8*  HCT 34.7*  PLT 334    Recent Labs    11/15/24 0416  NA 137  K 3.8  CL 100  CO2 29  GLUCOSE 106*  BUN 25*  CREATININE 0.64  CALCIUM 8.9     Intake/Output Summary (Last 24 hours) at 11/16/2024 0819 Last data filed at 11/16/2024 0507 Gross per 24 hour  Intake 840 ml  Output --  Net 840 ml         Physical Exam: Vital Signs Blood pressure (!) 113/56, pulse 73, temperature 98.5 F (36.9 C), temperature source Oral, resp. rate 19, height 5' 7 (1.702 m), weight 91.8 kg, SpO2 98%.   General: No acute distress, sitting in w/c working in OT, comfortable appearing.  Mood and affect are appropriate Heart: Regular rate and rhythm no rubs murmurs or extra sounds Lungs: Clear to auscultation, breathing unlabored, no rales or wheezes Abdomen: Positive bowel sounds, soft nontender to palpation, nondistended Extremities: No clubbing, cyanosis, or edema  PRIOR EXAMS: Skin: No evidence of breakdown, no evidence of rash. Incision CDI Neurologic: Cranial nerves II through XII intact, motor strength is 5/5 in bilateral deltoid, bicep, tricep, grip,5/5  Left , (pain inhibition) 3-/5 Right hip flexor, knee extensors,4  ankle dorsiflexor and plantar flexor Sensory exam normal sensation to light touch and proprioception in bilateral upper and lower extremities  Musculoskeletal:right hip pain still.     Assessment/Plan: 1. Functional deficits which require 3+ hours per day of interdisciplinary therapy in a  comprehensive inpatient rehab setting. Physiatrist is providing close team supervision and 24 hour management of active medical problems listed below. Physiatrist and rehab team continue to assess barriers to discharge/monitor patient progress toward functional and medical goals  Care Tool:  Bathing    Body parts bathed by patient: Right arm, Left arm, Chest, Abdomen, Front perineal area, Face, Left upper leg, Right upper leg   Body parts bathed by helper: Right lower leg, Left lower leg, Buttocks     Bathing assist Assist Level: Moderate Assistance - Patient 50 - 74%     Upper Body Dressing/Undressing Upper body dressing   What is the patient wearing?: Pull over shirt    Upper body assist Assist Level: Contact Guard/Touching assist    Lower Body Dressing/Undressing Lower body dressing      What is the patient wearing?: Underwear/pull up, Pants     Lower body assist Assist for lower body dressing: Moderate Assistance - Patient 50 - 74% (during simulated task using theraband)     Toileting Toileting    Toileting assist Assist for toileting: Moderate Assistance - Patient 50 - 74%     Transfers Chair/bed transfer  Transfers assist     Chair/bed  transfer assist level: Supervision/Verbal cueing     Locomotion Ambulation   Ambulation assist      Assist level: Supervision/Verbal cueing Assistive device: Walker-rolling Max distance: 150'+   Walk 10 feet activity   Assist     Assist level: Supervision/Verbal cueing Assistive device: Walker-rolling   Walk 50 feet activity   Assist Walk 50 feet with 2 turns activity did not occur: Safety/medical concerns  Assist level: Supervision/Verbal cueing Assistive device: Walker-rolling    Walk 150 feet activity   Assist Walk 150 feet activity did not occur: Safety/medical concerns  Assist level: Supervision/Verbal cueing Assistive device: Walker-rolling    Walk 10 feet on uneven surface   activity   Assist Walk 10 feet on uneven surfaces activity did not occur: Safety/medical concerns         Wheelchair     Assist Is the patient using a wheelchair?: Yes Type of Wheelchair: Manual    Wheelchair assist level: Supervision/Verbal cueing Max wheelchair distance: 40    Wheelchair 50 feet with 2 turns activity    Assist        Assist Level: Minimal Assistance - Patient > 75%   Wheelchair 150 feet activity     Assist      Assist Level: Dependent - Patient 0%   Blood pressure (!) 113/56, pulse 73, temperature 98.5 F (36.9 C), temperature source Oral, resp. rate 19, height 5' 7 (1.702 m), weight 91.8 kg, SpO2 98%.  Medical Problem List and Plan: 1. Functional deficits secondary to R hip septic arthritis s/p I&D 12/14              -patient may shower with surgical sites covered             -ELOS/Goals:  11/19/24, SPV-- d/c goal 1/2            -Continue CIR therapies including PT, OT    2.  Antithrombotics: -DVT/anticoagulation:  Mechanical: Sequential compression devices, below knee Bilateral lower extremities Pharmaceutical: Lovenox  40mg  daily             -antiplatelet therapy: N/A   3. Pain Management: Tylenol  1000 mg 3 times daily, gabapentin  300 mg 3 times daily, lidocaine  5% patch, Robaxin  500 mg 3 times daily.  PRN: Tylenol  650 mg and Oxycodone  5 mg q4h prn-- changed tylenol  to 500mg  BID PRN to avoid overdosing.   -Change Oxy IR to 2.5 mg q4h prn-- as of 12/27 but has only used 2.5 mg daily in addition to scheduled tylenol  Add diclofenac  gel for RIght knee pain   4. Mood/Behavior/Sleep: LCSW to follow for evaluation and support when available.              -antipsychotic agents: N/A  -trazodone  PRN -11/07/24 pt uses benadryl  at home, will increase trazodone  ot 50mg     5. Neuropsych/cognition: This patient is capable of making decisions on his own behalf.   6. Skin/Wound Care: Routine pressure relief measures. Terbinafine  250mg  weekly  for fungal infx -R hip incision CDI, 8d post op removed Post op dressing    7. Fluids/Electrolytes/Nutrition: Monitor I&O and weight. Follow up labs. Continue vitamins/supplements.  -11/06/24 mild transaminitis at admission here, ALT now normal and AST as well as Alk P  only mildly elevated   8. Right hip septic arthritis/MSSA bactermia: Afebrile, blood cultures neg from 12/14.  Per ID continue IV cefazolin  course EOT 12/12/2024. PICC line to be placed in CIR 12/18.    -CBC stable 12/25  9. Hypertension: Monitor  BP per protocol. Continue losartan -hydrochlorothiazide  50-12.5mg  daily at home.  -11/16/24 BPs stable and in range  Vitals:   11/12/24 2029 11/13/24 0554 11/13/24 1618 11/13/24 2012  BP: (!) 131/56 (!) 128/54 (!) 116/56 (!) 129/54   11/14/24 0607 11/14/24 1530 11/14/24 2001 11/15/24 0507  BP: (!) 111/54 122/60 (!) 118/54 (!) 129/52   11/15/24 1441 11/15/24 2004 11/16/24 0504 11/16/24 0811  BP: (!) 108/54 (!) 128/55 (!) 120/53 (!) 113/56      10.OSA: Continue CPAP nightly.    11. Hyponatremia: resolved   12. HLD: Simvastatin  40mg  nightly  13. Bowel management: colace 100mg  daily only - miralax  BID, senokot 2 tabs BID changed to PRN -LBM 12/27  14. Urinary urgency/dribbling: -UA neg, likely BPH, no current c/os, occ incont  LOS: 12 days A FACE TO FACE EVALUATION WAS PERFORMED  Duane Greene 11/16/2024, 8:19 AM     "

## 2024-11-16 NOTE — Progress Notes (Signed)
 Occupational Therapy Session Note  Patient Details  Name: Duane Greene MRN: [id number] Date of Birth: 1951/11/08  Today's Date: 11/16/2024 OT Individual Time: 0815-0928 OT Individual Time Calculation (min): 73 min    Short Term Goals: Week 2:  OT Short Term Goal 1 (Week 2): STG=LTG  Skilled Therapeutic Interventions/Progress Updates:    Skilled OT intervention with focus on sit<>stand from varying heights, standing balance, bed mobility, discharge planning, and activity tolerance to increase independence with bADLs. Pt practiced bed mobility on therapy mat with supervision. Pt also practiced bed mobility on std bed in ADL apartment. Pt able to lift RLE onto bed with increased effort. Supine>sit EOB with supervision. Pt practiced sit<>stand from std chair height of 17 with CGA. Standing task placing and removing clothes pins from basketball net without UE support. Pt returned to room and remained in w/c with wife present. All needs within reach.   Therapy Documentation Precautions:  Precautions Precautions: Fall Restrictions Weight Bearing Restrictions Per Provider Order: Yes RLE Weight Bearing Per Provider Order: Weight bearing as tolerated Pain: Pt c/o 3/10 Rt hip pain; meds admin prior to therapy and rest as appropriate   Therapy/Group: Individual Therapy  [name] 11/16/2024, 11:54 AM

## 2024-11-16 NOTE — Progress Notes (Signed)
 Physical Therapy Session Note  Patient Details  Name: Duane Greene MRN: [id number] Date of Birth: 09/15/1951  Today's Date: 11/16/2024 PT Individual Time: 9048-8952; 1420 - 1525 PT Individual Time Calculation (min): 56 min; 65 min   Short Term Goals: Week 1:  PT Short Term Goal 1 (Week 1): Pt will perform bed mobility w/ min A on flat bed. PT Short Term Goal 1 - Progress (Week 1): Met PT Short Term Goal 2 (Week 1): Pt will transfer sit to stand w/ min A consistently. PT Short Term Goal 2 - Progress (Week 1): Met PT Short Term Goal 3 (Week 1): Pt will amb x 50' w/ RW and CGA PT Short Term Goal 3 - Progress (Week 1): Met PT Short Term Goal 4 (Week 1): PT to assess stairs. PT Short Term Goal 4 - Progress (Week 1): Met  SESSION 1 Skilled Therapeutic Interventions/Progress Updates: Patient sitting in [facility] with wife present on entrance to room. Patient alert and agreeable to PT session.   Patient reported unrated pain in R knee with rest breaks required as needed.  - Pt ambulated roughly 95' prior to stair navigation with supervision due to R knee pain increasing (pt endorses decrease in R hip pain this session vs previous sessions).  - Pt navigated 4 (6) steps with BHR and step to with cue to ascend with L LE, and to descend with R. Pt attempted with one railing as pt could not recall if BHR at home is reachable. Pt unable to do so with one railing without increase in pain in R knee. PTA discussed with pt and pt wife about railing at home with PTA showing them stairway railing distance and they agreed it was roughly the same width.  - PT navigated 4 (7) steps in stairway with B UE on railing with overall supervision ascending, and close supervision descending due to increase in step height. Pt pivoted on 4th step to descend with CGA for safety - Pt sitting edge of mat and stepping over orange cone with R LE (adduction + hip flexion then abduction + hip flexion) to carryover mechanics/strength  to advance R LE onto bed. Mass practice performed with rest breaks required  - Pt progressed to 15 bowling pin with added knee extension. Mass practice performed with rest breaks required - Pt performed bed mobility at end of session with bed height increased to 26.5 to simulate height at home. Pt cued to scoot posterior on bed at angle towards HOB and to lean posteriorly onto L elbow with R UE also behind to increase hip flexor stretch on R LE and to also increase leverage. Pt performed task with supervision and agreed this was best option to get into bed without assistance (pt also to have railing at home so PTA also suggested use of railing if needed to stabilize).   Patient semi-reclined in bed at end of session with brakes locked, wife present and all needs within reach.  SESSION 2 Skilled Therapeutic Interventions/Progress Updates: Patient sitting EOB with wife present on entrance to room. Patient alert and agreeable to PT session.   Patient reported some pain during session while ambulating in R knee, but tolerable (rest breaks as needed). Most of session focused on PTA supervising pt wife wife assisting pt with shower. PTA donned plastic covering over PICC line in R UE with education provided to wife on ensuring tape fully wraps over plastic and skin to avoid water leak. Pt performed all sit<>stand during session  with supervision. Pt ambulated with supervision as well in RW in room to shower and back to recliner, as well as from room<day room gym following shower (100'+). Pt provided with VC on how to use extended shower sponge with handle to reach lower back. Pt wife safely assisted pt as needed during shower routine with no issue. Pt sat edge of recliner and donned personal pants with reacher and required minA to set up LE into leg of pants and stood and donned up to waist with supervision.  PTA provided pt with rollator and pt ambulated less than 100' with 2 rest breaks required and  demonstrative cues on locking mechanism and to safely stabilize rollator when sitting and to avoid increasing WB/forward flexed posture to avoid anterior LOB. Further practice will be needed for pt to safely use rollator prior to d/c if that is deemed appropriate.   Patient sitting in WC at end of session with brakes locked, wife present, and all needs within reach.       Therapy Documentation Precautions:  Precautions Precautions: Fall Restrictions Weight Bearing Restrictions Per Provider Order: Yes RLE Weight Bearing Per Provider Order: Weight bearing as tolerated  Therapy/Group: Individual Therapy  Justice Milliron PTA 11/16/2024, 12:38 PM

## 2024-11-16 NOTE — Progress Notes (Signed)
 Orthopedic Tech Progress Note Patient Details:  Duane Greene 20-Jan-1951 [id number]  Floor RN will apply ACE WRAP to patient   Patient ID: Franz L Felling, male   DOB: [birth year], 73 y.o.   MRN: [id number]  [name] Pac 11/16/2024, 10:55 AM

## 2024-11-17 MED ORDER — GERHARDT'S BUTT CREAM
1.0000 | TOPICAL_CREAM | Freq: Two times a day (BID) | CUTANEOUS | Status: DC
  Administered 2024-11-17 – 2024-11-19 (×5): 1 via TOPICAL
  Filled 2024-11-17 (×2): qty 60

## 2024-11-17 MED ORDER — ZINC SULFATE 220 (50 ZN) MG PO CAPS
220.0000 mg | ORAL_CAPSULE | Freq: Once | ORAL | Status: DC
  Filled 2024-11-17: qty 1

## 2024-11-17 NOTE — Progress Notes (Addendum)
 Orthopedic Surgery Progress Note   Assessment: Patient is a 73 y.o. male with right hip septic arthritis, right gluteus musculature myositis   S/p incision and drainage of right hip   Plan: -Orthopedic operative plans complete -Is still getting ancef  -Staples removed today on the floor -Okay to let soap/water run over incision but do not submerge -Weight bearing status: as tolerated  -PT evaluate and treat -Pain control -Dispo: per primary  ___________________________________________________________________________  Subjective: Patient has noticed improvement in his pain.  He still has some pain in the posterior hip.  Not having any groin pain.  Has been taking less narcotics.  Is now taking 2.5 mg of oxycodone  prior to working with physical therapy but is otherwise not needing any pain medications.  He has not noticed any redness or drainage around his incision.  He has been walking longer distances with physical therapy.  He feels that he has been improving.   Physical Exam:  General: no acute distress, appears stated age Neurologic: alert, answering questions appropriately, following commands Respiratory: unlabored breathing on room air, symmetric chest rise Psychiatric: appropriate affect, normal cadence to speech  MSK:    -Right lower extremity  Was able to do sit to stand on his own without assistance EHL/TA/GSC intact Plantarflexes and dorsiflexes toes Sensation intact to light touch in sural, saphenous, tibial, deep peroneal, and superficial peroneal nerve distributions Foot warm and well perfused   Yesterday's total administered Morphine  Milligram Equivalents: 3.75   Patient name: Duane Greene Patient MRN: [id number] Date: 11/17/2024

## 2024-11-17 NOTE — Progress Notes (Signed)
 Physical Therapy Discharge Summary  Patient Details  Name: Duane Greene MRN: [id number] Date of Birth: 06/22/1951  Date of Discharge from PT service:{Time; dates multiple:304500300}  {CHL IP REHAB PT TIME CALCULATION:304800500}   Patient has met {NUMBERS 0-12:18577} of {NUMBERS 0-12:18577} long term goals due to {due un:6958322}.  Patient to discharge at [place] level {LOA:3049010}.   Patient's care partner {care partner:3041650} to provide the necessary {assistance:3041652} assistance at discharge.  Reasons goals not met: ***  Recommendation:  Patient will benefit from ongoing skilled PT services in {setting:3041680} to continue to advance safe functional mobility, address ongoing impairments in ***, and minimize fall risk.  Equipment: {equipment:3041657}  Reasons for discharge: {Reason for discharge:3049018}  Patient/family agrees with progress made and goals achieved: {Pt/Family agree with progress/goals:3049020}  PT Discharge Precautions/Restrictions Precautions Precautions: Fall Restrictions Weight Bearing Restrictions Per Provider Order: Yes RLE Weight Bearing Per Provider Order: Weight bearing as tolerated Vital Signs   Pain   Pain Interference   Vision/Perception  Vision - History Ability to See in Adequate Light: 0 Adequate  Cognition Overall Cognitive Status: Within Functional Limits for tasks assessed Arousal/Alertness: Awake/alert Orientation Level: Oriented X4 Awareness: Appears intact Safety/Judgment: Appears intact Sensation Sensation Light Touch: Appears Intact Hot/Cold: Appears Intact Proprioception: Appears Intact Coordination Gross Motor Movements are Fluid and Coordinated: No Coordination and Movement Description: pt limited by pain in R hip (has improved since evaluation) and R knee pain (has increased since evaluation) Motor  Motor Motor: Other (comment) Motor - Discharge Observations: decreased 2/2 R LE pain and general  decreased strength  Mobility Bed Mobility Bed Mobility: Rolling Left;Sit to Supine;Supine to Sit Rolling Right: Supervision/verbal cueing Rolling Left: Supervision/Verbal cueing Supine to Sit: Supervision/Verbal cueing Sit to Supine: Supervision/Verbal cueing Transfers Transfers: Sit to Stand;Stand to Sit;Stand Pivot Transfers Sit to Stand: Supervision/Verbal cueing Stand to Sit: Supervision/Verbal cueing Stand Pivot Transfers: Supervision/Verbal cueing Transfer (Assistive device): Rolling walker Locomotion  Gait Ambulation: Yes Gait Assistance: Supervision/Verbal cueing Gait Distance (Feet): 150 Feet Assistive device: Rolling walker Gait Assistance Details: Other (comment) Gait Assistance Details: Verbal cues for posture Gait Gait: Yes Gait Pattern: Step-through pattern;Step-to pattern;Decreased stance time - right;Decreased step length - left;Poor foot clearance - left;Poor foot clearance - right;Trunk flexed Gait velocity: decreased but has improved since evaluation Stairs / Additional Locomotion Stairs: Yes Stairs Assistance: Supervision/Verbal cueing Stair Management Technique: Two rails Number of Stairs: 4 Height of Stairs: 6 Pick up small object from the floor assist level: Supervision/Verbal cueing Pick up small object from the floor assistive device: Chief Operating Officer Mobility: Yes Wheelchair Assistance: Doctor, General Practice: Both upper extremities Wheelchair Parts Management: Needs assistance Distance: 150  Trunk/Postural Assessment  Cervical Assessment Cervical Assessment: Within Functional Limits Thoracic Assessment Thoracic Assessment: Within Functional Limits Lumbar Assessment Lumbar Assessment: Exceptions to [facility] (posterior pelvic tilt) Postural Control Postural Control: Deficits on evaluation Righting Reactions: delayed. but has improved  Balance   Extremity Assessment            Duane A  Greene 11/17/2024, 12:53 PM

## 2024-11-17 NOTE — Progress Notes (Signed)
 Physical Therapy Session Note  Patient Details  Name: Duane Greene MRN: [id number] Date of Birth: Oct 27, 1951  Today's Date: 11/17/2024 PT Individual Time: 9166-9086; 1035 - 1115; 1418 - 1457 PT Individual Time Calculation (min): 40 min; 40 min; 39 min   Short Term Goals: Week 2:     SESSION 1 Skilled Therapeutic Interventions/Progress Updates: Patient sitting in WC on entrance to room. Patient alert and agreeable to PT session.   Patient reported unrated pain in R knee throughout session (rest breaks required)  Therapeutic Activity: Transfers: Pt performed sit<>stand transfers throughout session with supervision/modI. Provided VC for locking rollator.  Pt ambulated from room<day room in rollator with supervision and cue to decrease WB on UE's. Pt required seated rest x 2. Pt also required frequent use of brakes to decrease speed as pt also has forward flexed posture which increases risk of this and anterior LOB. Pt sitting edge of mat performing hip flexion + adduction/abduction and knee flexion raising LE over bowling pin to carry over to advance LE onto bed (mass practice) with pt showing improved ROM this session vs previous.  Five times Sit to Stand Test (FTSS) Method: Use a straight back chair with a solid seat that is 16-18 high. Ask participant to sit on the chair with arms folded across their chest.   Instructions: Stand up and sit down as quickly as possible 5 times, keeping your arms folded across your chest.   Measurement: Stop timing when the participant stands the 5th time.   TIME: _41.36 (in seconds) - 46.47s - 39.99s - 37.61s   Times > 13.6 seconds is associated with increased disability and morbidity (Guralnik, 2000) Times > 15 seconds is predictive of recurrent falls in healthy individuals aged 28 and older (Buatois, et al., 2008) Normal performance values in community dwelling individuals aged 9 and older (Bohannon, 2006): 60-69 years: 11.4  seconds 70-79 years: 12.6 seconds 80-89 years: 14.8 seconds   MCID: >= 2.3 seconds for Vestibular Disorders Ary, 2006)  Patient sitting in [facility] at end of session with brakes locked, wife present, and all needs within reach.  SESSION 2 Skilled Therapeutic Interventions/Progress Updates: Patient sitting in WC on entrance to room. Patient alert and agreeable to PT session.   Patient reported unrated pain in R knee. PTA reached out to hanger rep regarding Velcro sleeve to assist with compression and pain (hanger rep to arrive during the day to talk to pt). Pt ambulated from room<day room gym with rollator and same cues as previous session to decrease speed, increase upright posture and to decrease WB on B UE's to avoid anterior LOB. Pt required increased time to cover distance and frequently pressed brakes to avoid rollator sliding out in front. Pt required seated rest break and cues to pivot full 180* with back of legs touching seat of rollator. Pt ambulated rest of distance to mat in day room. CGA for rollator for safety. Pt stood and tossed bean bags to avaya x 2 rounds to work on standing endurance. Pt with close supervision throughout (up to 3 minutes until all bags were tossed). Pt ambulated in RW (rollator deferred due to pt's decreased safety and tolerance as it increase R knee pain). Pt close supervision then CGA towards end to ambulate back to room due to pain in R knee increase and pt requiring frequent standing rest breaks.  Patient sitting in WC at end of session with brakes locked, wife present, and all needs within reach.  SESSION 3  Skilled Therapeutic Interventions/Progress Updates: Patient sitting in WC on entrance to room. Patient alert and agreeable to PT session.   Patient reported decrease in pain during session with new compression sleeve donned on R knee. Pt ambulated from room<ortho gym and required 4 seated rest breaks due to fatigue. Pt with decreased gait velocity  and required cues to increase upright posture. Pt performed car transfer to heightened elevation to simulate care at d/c. Pt performed with supervision and use of R UE to assist with elevating R LE onto car. Pt transported back to room in [facility] for end of session due to fatigue. PTA, pt and pt wife discussed transport chair after d/c use for longer distances with pt and pt wife thinking about asking for one tomorrow.  Patient sitting in WC at end of session with brakes locked, family present, and all needs within reach.        Therapy Documentation Precautions:  Precautions Precautions: Fall Restrictions Weight Bearing Restrictions Per Provider Order: Yes RLE Weight Bearing Per Provider Order: Weight bearing as tolerated  Therapy/Group: Individual Therapy  Wilmot Quevedo PTA 11/17/2024, 9:23 AM

## 2024-11-17 NOTE — Progress Notes (Signed)
 Patient ID: Duane Greene, male   DOB: 11-Mar-1951, 73 y.o.   MRN: [id number]  This SW covering for primary SW, Becky Dupree.   SW met with pt and pt wife in room to provide updates from team conference, and review discharge.Pt wife confirms meeting with Pam Chandler/[facility] about IV abx. [facility]- [facility]. States PT said rollator is not safe at this time. SW will confirm. Wife reports she has a RW.   Graeme Jude, MSW, LCSW Office: [phone] Cell: [phone] Fax: [phone]

## 2024-11-17 NOTE — Plan of Care (Signed)
" °  Problem: Consults Goal: RH GENERAL PATIENT EDUCATION Description: See Patient Education module for education specifics. Outcome: Progressing   Problem: RH BOWEL ELIMINATION Goal: RH STG MANAGE BOWEL WITH ASSISTANCE Description: STG Manage Bowel with mod I Assistance. Outcome: Progressing Goal: RH STG MANAGE BOWEL W/MEDICATION W/ASSISTANCE Description: STG Manage Bowel with Medication with mod I Assistance. Outcome: Progressing   Problem: RH PAIN MANAGEMENT Goal: RH STG PAIN MANAGED AT OR BELOW PT'S PAIN GOAL Description: Pain < 4 with prns Outcome: Progressing   Problem: RH KNOWLEDGE DEFICIT GENERAL Goal: RH STG INCREASE KNOWLEDGE OF SELF CARE AFTER HOSPITALIZATION Description: Patient and wife will be able to manage care at discharge using educational resources for medications skin care, dietary modification independently Outcome: Progressing   "

## 2024-11-17 NOTE — Progress Notes (Signed)
 "                                                        PROGRESS NOTE   Subjective/Complaints:  No problems overnite , appreciate ortho note   ROS: as per HPI. Denies CP, SOB, abd pain, N/V/D/C, or any other complaints at this time.    Objective:   No results found.  Recent Labs    11/15/24 0416  WBC 8.0  HGB 11.8*  HCT 34.7*  PLT 334    Recent Labs    11/15/24 0416  NA 137  K 3.8  CL 100  CO2 29  GLUCOSE 106*  BUN 25*  CREATININE 0.64  CALCIUM 8.9     Intake/Output Summary (Last 24 hours) at 11/17/2024 0928 Last data filed at 11/17/2024 0900 Gross per 24 hour  Intake 1066 ml  Output --  Net 1066 ml         Physical Exam: Vital Signs Blood pressure (!) 120/57, pulse 68, temperature 98.1 F (36.7 C), temperature source Oral, resp. rate 18, height 5' 7 (1.702 m), weight 91.8 kg, SpO2 93%.   General: No acute distress, sitting in w/c working in OT, comfortable appearing.  Mood and affect are appropriate Heart: Regular rate and rhythm no rubs murmurs or extra sounds Lungs: Clear to auscultation, breathing unlabored, no rales or wheezes Abdomen: Positive bowel sounds, soft nontender to palpation, nondistended Extremities: No clubbing, cyanosis, or edema  PRIOR EXAMS: Skin: No evidence of breakdown, no evidence of rash. Incision CDI Neurologic: Cranial nerves II through XII intact, motor strength is 5/5 in bilateral deltoid, bicep, tricep, grip,5/5  Left , (pain inhibition) 3-/5 Right hip flexor, knee extensors,4  ankle dorsiflexor and plantar flexor Sensory exam normal sensation to light touch and proprioception in bilateral upper and lower extremities  Musculoskeletal:right hip pain still.     Assessment/Plan: 1. Functional deficits which require 3+ hours per day of interdisciplinary therapy in a comprehensive inpatient rehab setting. Physiatrist is providing close team supervision and 24 hour management of active medical problems listed  below. Physiatrist and rehab team continue to assess barriers to discharge/monitor patient progress toward functional and medical goals  Care Tool:  Bathing    Body parts bathed by patient: Right arm, Left arm, Chest, Abdomen, Front perineal area, Face, Left upper leg, Right upper leg   Body parts bathed by helper: Right lower leg, Left lower leg, Buttocks     Bathing assist Assist Level: Moderate Assistance - Patient 50 - 74%     Upper Body Dressing/Undressing Upper body dressing   What is the patient wearing?: Pull over shirt    Upper body assist Assist Level: Contact Guard/Touching assist    Lower Body Dressing/Undressing Lower body dressing      What is the patient wearing?: Underwear/pull up, Pants     Lower body assist Assist for lower body dressing: Moderate Assistance - Patient 50 - 74% (during simulated task using theraband)     Toileting Toileting    Toileting assist Assist for toileting: Moderate Assistance - Patient 50 - 74%     Transfers Chair/bed transfer  Transfers assist     Chair/bed transfer assist level: Supervision/Verbal cueing     Locomotion Ambulation   Ambulation assist      Assist level: Supervision/Verbal cueing Assistive  device: Walker-rolling Max distance: 150'+   Walk 10 feet activity   Assist     Assist level: Supervision/Verbal cueing Assistive device: Walker-rolling   Walk 50 feet activity   Assist Walk 50 feet with 2 turns activity did not occur: Safety/medical concerns  Assist level: Supervision/Verbal cueing Assistive device: Walker-rolling    Walk 150 feet activity   Assist Walk 150 feet activity did not occur: Safety/medical concerns  Assist level: Supervision/Verbal cueing Assistive device: Walker-rolling    Walk 10 feet on uneven surface  activity   Assist Walk 10 feet on uneven surfaces activity did not occur: Safety/medical concerns         Wheelchair     Assist Is the patient  using a wheelchair?: Yes Type of Wheelchair: Manual    Wheelchair assist level: Supervision/Verbal cueing Max wheelchair distance: 40    Wheelchair 50 feet with 2 turns activity    Assist        Assist Level: Minimal Assistance - Patient > 75%   Wheelchair 150 feet activity     Assist      Assist Level: Dependent - Patient 0%   Blood pressure (!) 120/57, pulse 68, temperature 98.1 F (36.7 C), temperature source Oral, resp. rate 18, height 5' 7 (1.702 m), weight 91.8 kg, SpO2 93%.  Medical Problem List and Plan: 1. Functional deficits secondary to R hip septic arthritis s/p I&D 12/14              -patient may shower with surgical sites covered             -ELOS/Goals:  11/19/24, SPV-- d/c goal 1/2            -Continue CIR therapies including PT, OT    2.  Antithrombotics: -DVT/anticoagulation:  Mechanical: Sequential compression devices, below knee Bilateral lower extremities Pharmaceutical: Lovenox  40mg  daily             -antiplatelet therapy: N/A   3. Pain Management: Tylenol  1000 mg 3 times daily, gabapentin  300 mg 3 times daily, lidocaine  5% patch, Robaxin  500 mg 3 times daily.  PRN: Tylenol  650 mg and Oxycodone  5 mg q4h prn-- changed tylenol  to 500mg  BID PRN to avoid overdosing.   -Change Oxy IR to 2.5 mg q4h prn-- as of 12/27 but has only used 2.5 mg daily in addition to scheduled tylenol  Add diclofenac  gel for RIght knee pain   4. Mood/Behavior/Sleep: LCSW to follow for evaluation and support when available.              -antipsychotic agents: N/A  -trazodone  PRN -11/07/24 pt uses benadryl  at home, will increase trazodone  ot 50mg     5. Neuropsych/cognition: This patient is capable of making decisions on his own behalf.   6. Skin/Wound Care: Routine pressure relief measures. Terbinafine  250mg  weekly for fungal infx -R hip incision CDI, 8d post op removed Post op dressing    7. Fluids/Electrolytes/Nutrition: Monitor I&O and weight. Follow up labs.  Continue vitamins/supplements.  -11/06/24 mild transaminitis at admission here, ALT now normal and AST as well as Alk P  only mildly elevated   8. Right hip septic arthritis/MSSA bactermia: Afebrile, blood cultures neg from 12/14.  Per ID continue IV cefazolin  course EOT 12/12/2024. PICC line to be placed in CIR 12/18.    -CBC stable 12/25  9. Hypertension: Monitor BP per protocol. Continue losartan -hydrochlorothiazide  50-12.5mg  daily at home.  -11/16/24 BPs stable and in range  Vitals:   11/13/24 1618 11/13/24 2012 11/14/24  9392 11/14/24 1530  BP: (!) 116/56 (!) 129/54 (!) 111/54 122/60   11/14/24 2001 11/15/24 0507 11/15/24 1441 11/15/24 2004  BP: (!) 118/54 (!) 129/52 (!) 108/54 (!) 128/55   11/16/24 0504 11/16/24 0811 11/16/24 2022 11/17/24 0512  BP: (!) 120/53 (!) 113/56 (!) 123/52 (!) 120/57      10.OSA: Continue CPAP nightly.    11. Hyponatremia: resolved   12. HLD: Simvastatin  40mg  nightly  13. Bowel management: colace 100mg  daily only - miralax  BID, senokot 2 tabs BID changed to PRN -LBM 12/27  14. Urinary urgency/dribbling: -UA neg, likely BPH, no current c/os, occ incont  LOS: 13 days A FACE TO FACE EVALUATION WAS PERFORMED  Prentice FORBES Compton 11/17/2024, 9:28 AM     "

## 2024-11-17 NOTE — Patient Care Conference (Signed)
 Inpatient RehabilitationTeam Conference and Plan of Care Update Date: 11/17/2024   Time: 10:09 AM    Patient Name: Duane Greene      Medical Record Number: [id number]  Date of Birth: December 26, 1950 Sex: Male         Room/Bed: 5T92R/5T92R-98 Payor Info: Payor: [employer] / Plan: [employer] PPO / Product Type: *No Product type* /    Admit Date/Time:  11/04/2024  2:24 PM  Primary Diagnosis:  Septic arthritis [facility])  Hospital Problems: Principal Problem:   Septic arthritis (HCC) Active Problems:   MSSA bacteremia   Endocarditis of mitral valve    Expected Discharge Date: Expected Discharge Date: 11/19/24  Team Members Present: Physician leading conference: Dr. Prentice Compton Social Worker Present: Graeme Jude, LCSW Nurse Present: Barnie Ronde, RN PT Present: Sherlean Perks, PT;Dominic Freddi, PTA OT Present: Recardo Maxwell, OT SLP Present: Joane Fuss, SLP     Current Status/Progress Goal Weekly Team Focus  Bowel/Bladder   Patient is incontinent of bowel and bladder   To have decreased incident of incontinent B&B   Achieving bowel and bladder control    Swallow/Nutrition/ Hydration               ADL's   close to goal level CGA overall, min A LB self care   CGA for toileting, bathing, and LB dressing, supervision toilet transfers and tub transfers   ADL training, pt/fam education, activity tolerance    Mobility   overall modI for transfers and bed mobility. Supervision for ambulation and stairs, mostly due to R knee pain and needing to sit for rest   supervision  pt/family ed, endurance, therex, bed mobility, dynamic standing balance    Communication                Safety/Cognition/ Behavioral Observations               Pain   Pain is control by scheduled Tylenol    To be painfree   Afequate pain control    Skin   Incision with staples clean, dry and intact. Groin area and penis  appears red   To prevent breakdown and  infection of incision and decrease redness in affected area  Proper skincare      Discharge Planning:  Wife stays here with him and is very involved in his care. Hedda is set up for follow up and IV antibiotics. Has DME from past admissions. Holley Herring will need to do education prior to DC home   Team Discussion: Patient admitted with debility post septic arthritis. Limited by stiffness in hip, pain in right lower extremity.   Patient on target to meet rehab goals: yes, currently pain is managed but progress limited by poor endurance.  Currently at goal level and able to ambulate up to 200' using a RW with flexed forward posturing wearing knee sleeve.  *See Care Plan and progress notes for long and short-term goals.   Revisions to Treatment Plan:  Rollator trial   Teaching Needs: Safety, IV abx, medications, skin care, transfers, toileting, etc.   Current Barriers to Discharge: Decreased caregiver support, Home enviroment access/layout, and IV antibiotics  Possible Resolutions to Barriers: Family education HH follow up services     Medical Summary Current Status: Kurtis are out, appreciate orthopedic evaluation, urinary frequency improving, pain issues improving  Barriers to Discharge: Infection/IV Antibiotics   Possible Resolutions to Barriers/Weekly Focus: Home on IV antibiotics with home health and then transition to outpatient, Ortho follow-up will  be needed   Continued Need for Acute Rehabilitation Level of Care: The patient requires daily medical management by a physician with specialized training in physical medicine and rehabilitation for the following reasons: Direction of a multidisciplinary physical rehabilitation program to maximize functional independence : Yes Medical management of patient stability for increased activity during participation in an intensive rehabilitation regime.: Yes Analysis of laboratory values and/or radiology reports with any subsequent  need for medication adjustment and/or medical intervention. : Yes   I attest that I was present, lead the team conference, and concur with the assessment and plan of the team.   Fredericka Sober B 11/17/2024, 1:35 PM

## 2024-11-17 NOTE — Progress Notes (Signed)
 Occupational Therapy Session Note  Patient Details  Name: Duane Greene MRN: [id number] Date of Birth: [birth year]  Today's Date: 11/17/2024 OT Individual Time: 0920-0959 OT Individual Time Calculation (min): 39 min    Short Term Goals: Week 1:  OT Short Term Goal 1 (Week 1): pt will be able to don pants over feet using reacher if needed with supervision. OT Short Term Goal 1 - Progress (Week 1): Met OT Short Term Goal 2 (Week 1): Pt will be able to rise to stand with min A to CGA to prepare for toileting needs. OT Short Term Goal 2 - Progress (Week 1): Met OT Short Term Goal 3 (Week 1): pt will be able to ambulate 20 ft from bed to toilet with RW with CGA. OT Short Term Goal 3 - Progress (Week 1): Met Week 2:  OT Short Term Goal 1 (Week 2): STG=LTG  Skilled Therapeutic Interventions/Progress Updates:    Pt received in wc dressed and ready for the day.  Pt's wife present. Reviewed his progress in rehab.  Both stated they are feeling ready for discharge and feeling comfortable with all aspects of self care and mobility to be performed at home. Pt feels he needs to continue to work on activity and standing tolerance to be able to stand at the sink to shave.   Pt ambulated with his 4 WW with S to rehab room without a break with his wife doing a w/c follow.  Pt sat to rest and then stood for 4 1/2 min engaging in a pill box activity.    Pt then felt fatigued and opted to sit in w/c.   Time running short so transported pt back to room via w/c. Pt in room with wife and all needs met.  Therapy Documentation Precautions:  Precautions Precautions: Fall Restrictions Weight Bearing Restrictions Per Provider Order: Yes RLE Weight Bearing Per Provider Order: Weight bearing as tolerated    Vital Signs: Therapy Vitals Temp: 98.1 F (36.7 C) Temp Source: Oral Pulse Rate: 68 Resp: 18 BP: (!) 120/57 Patient Position (if appropriate): Lying Oxygen Therapy SpO2: 93 % Pain: Pain  Assessment Pain Scale: 0-10 Pain Score: 5  Pain Location: Hip Pain Intervention(s): Medication (See eMAR);Repositioned     Therapy/Group: Individual Therapy  Payden Bonus 11/17/2024, 8:56 AM

## 2024-11-18 LAB — CBC WITH DIFFERENTIAL/PLATELET
Abs Immature Granulocytes: 0.05 K/uL (ref 0.00–0.07)
Basophils Absolute: 0.1 K/uL (ref 0.0–0.1)
Basophils Relative: 1 %
Eosinophils Absolute: 0.1 K/uL (ref 0.0–0.5)
Eosinophils Relative: 2 %
HCT: 34.5 % — ABNORMAL LOW (ref 39.0–52.0)
Hemoglobin: 11.5 g/dL — ABNORMAL LOW (ref 13.0–17.0)
Immature Granulocytes: 1 %
Lymphocytes Relative: 30 %
Lymphs Abs: 2 K/uL (ref 0.7–4.0)
MCH: 32.1 pg (ref 26.0–34.0)
MCHC: 33.3 g/dL (ref 30.0–36.0)
MCV: 96.4 fL (ref 80.0–100.0)
Monocytes Absolute: 0.9 K/uL (ref 0.1–1.0)
Monocytes Relative: 14 %
Neutro Abs: 3.7 K/uL (ref 1.7–7.7)
Neutrophils Relative %: 52 %
Platelets: 251 K/uL (ref 150–400)
RBC: 3.58 MIL/uL — ABNORMAL LOW (ref 4.22–5.81)
RDW: 13.2 % (ref 11.5–15.5)
WBC: 6.8 K/uL (ref 4.0–10.5)
nRBC: 0 % (ref 0.0–0.2)

## 2024-11-18 LAB — BASIC METABOLIC PANEL WITH GFR
Anion gap: 8 (ref 5–15)
BUN: 29 mg/dL — ABNORMAL HIGH (ref 8–23)
CO2: 29 mmol/L (ref 22–32)
Calcium: 9.1 mg/dL (ref 8.9–10.3)
Chloride: 101 mmol/L (ref 98–111)
Creatinine, Ser: 0.67 mg/dL (ref 0.61–1.24)
GFR, Estimated: >60 mL/min
Glucose, Bld: 97 mg/dL (ref 70–99)
Potassium: 3.9 mmol/L (ref 3.5–5.1)
Sodium: 138 mmol/L (ref 135–145)

## 2024-11-18 NOTE — Progress Notes (Signed)
 Physical Therapy Session Note  Patient Details  Name: Duane Greene MRN: [id number] Date of Birth: 01-01-1951  Today's Date: 11/18/2024 PT Individual Time: 1120-1200; 1334 - 1414 PT Individual Time Calculation (min): 40 min; 40 min   Short Term Goals: Week 2:     SESSION 1 Skilled Therapeutic Interventions/Progress Updates: Patient siting in [facility] with wife present on entrance to room. Patient alert and agreeable to PT session.   Patient reported no pain during session. Pt performed all sit<>stand transfers with RW with modI. Session focused on pt's grad day and updating outcome measures (see d/c for summary). Pt transported to day room gym for time management. Pt ambulated back to room with RW with supervision and PTA managing IV pole and cues required for pt to increase upright standing posture. Pt stated he feels like he is roughly 40% of level of function compared to reason for admission.   TUG (avg = 42.2s) in RW with supervision and PTA managing IV pole  - 37.92s  - 41.08s  - 47.60s  Patient sitting in WC at end of session with brakes locked, wife present, and all needs within reach.  SESSION 2 Skilled Therapeutic Interventions/Progress Updates: Patient sitting in [facility] with wife present on entrance to room. Patient alert and agreeable to PT session.   Patient reported no pain during session. Session focused on providing pt with HEP and having pt perform to ensure proper cuing for mechanics and set up. Pt also performed stair navigation with B HR per home set up with supervision and pt recalling to ascend with L LE, and to descend with R LE (pt initially attempted to navigate steps in stairway as it closely resembles width of hand rails at home but R knee buckled due to set up of railing on L as it does not start until second step).   Therapeutic Activity: Transfers: Pt performed sit<>stand transfers throughout session with modI and use RW (pt also donned personal pants standing with  supervision for safety while in RW).  Access Code: [email or web address] URL: [email or web address] Date: 11/18/2024 Prepared by: Delinda Bertrand  Exercises - Sit to Stand with Counter Support  - 1 x daily - 7 x weekly - 3 sets - 10 reps - Standing Hip Abduction with Counter Support  - 1 x daily - 7 x weekly - 3 sets - 10 reps - Seated March with Resistance  - 1 x daily - 7 x weekly - 3 sets - 10 reps - Seated Hip Abduction with Resistance  - 1 x daily - 7 x weekly - 3 sets - 10 reps  Patient sitting in WC at end of session with brakes locked, wife present, and all needs within reach.       Therapy Documentation Precautions:  Precautions Precautions: Fall Restrictions Weight Bearing Restrictions Per Provider Order: Yes RLE Weight Bearing Per Provider Order: Weight bearing as tolerated   Therapy/Group: Individual Therapy  Lucious Zou PTA 11/18/2024, 12:12 PM

## 2024-11-18 NOTE — Progress Notes (Signed)
 Occupational Therapy Discharge Summary  Patient Details  Name: Duane Greene MRN: [id number] Date of Birth: 03-05-1951  Date of Discharge from OT service:November 18, 2024  Patient has met 7 of 7 long term goals due to improved activity tolerance, improved balance, and ability to compensate for deficits.  Patient to discharge at overall Supervision level.  Patient's care partner is independent to provide the necessary physical assistance at discharge.    Reasons goals not met: n/a  Recommendation:  Patient will benefit from ongoing skilled OT services in home health setting to continue to advance functional skills in the area of BADL and iADL.  Equipment: No equipment provided  Reasons for discharge: treatment goals met  Patient/family agrees with progress made and goals achieved: Yes  OT Discharge Precautions/Restrictions   fall ADL ADL Eating: Independent Where Assessed-Eating: Wheelchair Grooming: Independent Where Assessed-Grooming: Wheelchair Upper Body Bathing: Supervision/safety Where Assessed-Upper Body Bathing: Shower Lower Body Bathing: Contact guard Where Assessed-Lower Body Bathing: Shower Upper Body Dressing: Modified independent (Device) Where Assessed-Upper Body Dressing: Wheelchair Lower Body Dressing: Supervision/safety Where Assessed-Lower Body Dressing: Wheelchair Toileting: Supervision/safety Where Assessed-Toileting: Teacher, Adult Education: Distant supervision Statistician Method: Proofreader: Grab bars, Raised toilet seat Film/video Editor: Close supervision Film/video Editor Method: Designer, Industrial/product: Emergency planning/management officer, Grab bars Vision Baseline Vision/History: 1 Wears glasses Patient Visual Report: No change from baseline Vision Assessment?: No apparent visual deficits Perception  Perception: Within Functional Limits Praxis Praxis: WFL Cognition Brief Interview for Mental Status  (BIMS) Repetition of Three Words (First Attempt): 3 Temporal Orientation: Year: Correct Temporal Orientation: Month: Accurate within 5 days Temporal Orientation: Day: Correct Recall: Sock: Yes, no cue required Recall: Blue: Yes, no cue required Recall: Bed: Yes, no cue required BIMS Summary Score: 15 Sensation Sensation Light Touch: Appears Intact Proprioception: Appears Intact Stereognosis: Not tested Coordination Gross Motor Movements are Fluid and Coordinated: No Motor  Motor Motor - Discharge Observations: decreased 2/2 R LE pain and general decreased strength Mobility  Bed Mobility Bed Mobility: Rolling Left;Sit to Supine;Supine to Sit Rolling Right: Supervision/verbal cueing Rolling Left: Supervision/Verbal cueing Supine to Sit: Supervision/Verbal cueing Sit to Supine: Supervision/Verbal cueing Transfers Sit to Stand: Supervision/Verbal cueing Stand to Sit: Supervision/Verbal cueing  Trunk/Postural Assessment  Cervical Assessment Cervical Assessment: Within Functional Limits Thoracic Assessment Thoracic Assessment: Within Functional Limits Lumbar Assessment Lumbar Assessment: Exceptions to [facility] (posterior pelvic tilt) Postural Control Postural Control: Deficits on evaluation Righting Reactions: delayed. but has improved  Balance Static Sitting Balance Static Sitting - Balance Support: Feet supported Static Sitting - Level of Assistance: 7: Independent Dynamic Sitting Balance Dynamic Sitting - Balance Support: Feet supported Dynamic Sitting - Level of Assistance: 7: Independent Static Standing Balance Static Standing - Balance Support: Bilateral upper extremity supported Static Standing - Level of Assistance: 6: Modified independent (Device/Increase time) Dynamic Standing Balance Dynamic Standing - Balance Support: Bilateral upper extremity supported Dynamic Standing - Level of Assistance: Other (comment) (supervision) Extremity/Trunk Assessment RUE  Assessment RUE Assessment: Exceptions to [facility] Active Range of Motion (AROM) Comments: sh 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

## 2024-11-18 NOTE — Progress Notes (Signed)
 "                                                        PROGRESS NOTE   Subjective/Complaints:  Reviewed labs still with mild anemia but stable  Pt's wife had questions regarding toenail trimming, advised DPM   ROS: as per HPI. Denies CP, SOB, abd pain, N/V/D/C, or any other complaints at this time.    Objective:   No results found.  Recent Labs    11/18/24 0412  WBC 6.8  HGB 11.5*  HCT 34.5*  PLT 251    Recent Labs    11/18/24 0412  NA 138  K 3.9  CL 101  CO2 29  GLUCOSE 97  BUN 29*  CREATININE 0.67  CALCIUM 9.1     Intake/Output Summary (Last 24 hours) at 11/18/2024 1014 Last data filed at 11/18/2024 0700 Gross per 24 hour  Intake 356 ml  Output --  Net 356 ml         Physical Exam: Vital Signs Blood pressure (!) 120/59, pulse 66, temperature (!) 97.5 F (36.4 C), temperature source Oral, resp. rate 16, height 5' 7 (1.702 m), weight 91.8 kg, SpO2 95%.   General: No acute distress, sitting in w/c working in OT, comfortable appearing.  Mood and affect are appropriate Heart: Regular rate and rhythm no rubs murmurs or extra sounds Lungs: Clear to auscultation, breathing unlabored, no rales or wheezes Abdomen: Positive bowel sounds, soft nontender to palpation, nondistended Extremities: No clubbing, cyanosis, or edema  PRIOR EXAMS: Skin: No evidence of breakdown, no evidence of rash. Incision CDI Neurologic: Cranial nerves II through XII intact, motor strength is 5/5 in bilateral deltoid, bicep, tricep, grip,5/5  Left , (pain inhibition) 3-/5 Right hip flexor, 4/5 knee extensors,4  ankle dorsiflexor and plantar flexor Sensory exam normal sensation to light touch and proprioception in bilateral upper and lower extremities  Musculoskeletal:right hip pain still.     Assessment/Plan: 1. Functional deficits which require 3+ hours per day of interdisciplinary therapy in a comprehensive inpatient rehab setting. Physiatrist is providing close team supervision  and 24 hour management of active medical problems listed below. Physiatrist and rehab team continue to assess barriers to discharge/monitor patient progress toward functional and medical goals  Care Tool:  Bathing    Body parts bathed by patient: Right arm, Left arm, Chest, Abdomen, Front perineal area, Face, Left upper leg, Right upper leg   Body parts bathed by helper: Right lower leg, Left lower leg, Buttocks     Bathing assist Assist Level: Moderate Assistance - Patient 50 - 74%     Upper Body Dressing/Undressing Upper body dressing   What is the patient wearing?: Pull over shirt    Upper body assist Assist Level: Contact Guard/Touching assist    Lower Body Dressing/Undressing Lower body dressing      What is the patient wearing?: Underwear/pull up, Pants     Lower body assist Assist for lower body dressing: Moderate Assistance - Patient 50 - 74% (during simulated task using theraband)     Toileting Toileting    Toileting assist Assist for toileting: Moderate Assistance - Patient 50 - 74%     Transfers Chair/bed transfer  Transfers assist     Chair/bed transfer assist level: Supervision/Verbal cueing     Locomotion Ambulation  Ambulation assist      Assist level: Supervision/Verbal cueing Assistive device: Walker-rolling Max distance: 150   Walk 10 feet activity   Assist     Assist level: Supervision/Verbal cueing Assistive device: Walker-rolling   Walk 50 feet activity   Assist Walk 50 feet with 2 turns activity did not occur: Safety/medical concerns  Assist level: Supervision/Verbal cueing Assistive device: Walker-rolling    Walk 150 feet activity   Assist Walk 150 feet activity did not occur: Safety/medical concerns  Assist level: Supervision/Verbal cueing Assistive device: Walker-rolling    Walk 10 feet on uneven surface  activity   Assist Walk 10 feet on uneven surfaces activity did not occur: Safety/medical  concerns   Assist level: Supervision/Verbal cueing Assistive device: Walker-rolling   Wheelchair     Assist Is the patient using a wheelchair?: Yes Type of Wheelchair: Manual    Wheelchair assist level: Supervision/Verbal cueing Max wheelchair distance: 150    Wheelchair 50 feet with 2 turns activity    Assist        Assist Level: Supervision/Verbal cueing   Wheelchair 150 feet activity     Assist      Assist Level: Supervision/Verbal cueing   Blood pressure (!) 120/59, pulse 66, temperature (!) 97.5 F (36.4 C), temperature source Oral, resp. rate 16, height 5' 7 (1.702 m), weight 91.8 kg, SpO2 95%.  Medical Problem List and Plan: 1. Functional deficits secondary to R hip septic arthritis s/p I&D 12/14              -patient may shower with surgical sites covered             -ELOS/Goals:  11/19/24, SPV-- d/c goal 1/2            -Continue CIR therapies including PT, OT    2.  Antithrombotics: -DVT/anticoagulation:  Mechanical: Sequential compression devices, below knee Bilateral lower extremities Pharmaceutical: Lovenox  40mg  daily             -antiplatelet therapy: N/A   3. Pain Management: Tylenol  1000 mg 3 times daily, gabapentin  300 mg 3 times daily, lidocaine  5% patch, Robaxin  500 mg 3 times daily.  PRN: Tylenol  650 mg and Oxycodone  5 mg q4h prn-- changed tylenol  to 500mg  BID PRN to avoid overdosing.   -Change Oxy IR to 2.5 mg q4h prn-- as of 12/27 but has only used 2.5 mg daily in addition to scheduled tylenol  Add diclofenac  gel for RIght knee pain Now has RIght knee orthosis   4. Mood/Behavior/Sleep: LCSW to follow for evaluation and support when available.              -antipsychotic agents: N/A  -trazodone  PRN -11/07/24 pt uses benadryl  at home, will increase trazodone  ot 50mg     5. Neuropsych/cognition: This patient is capable of making decisions on his own behalf.   6. Skin/Wound Care: Routine pressure relief measures. Onychomyosis  Terbinafine  250mg  weekly for fungal infx- toenail hygiene per DPM     7. Fluids/Electrolytes/Nutrition: Monitor I&O and weight. Follow up labs. Continue vitamins/supplements.  -  8. Right hip septic arthritis/MSSA bactermia: Afebrile, blood cultures neg from 12/14.  Per ID continue IV cefazolin  course EOT 12/12/2024. PICC line to be placed in CIR 12/18.    -CBC stable 12/25  9. Hypertension: Monitor BP per protocol. Continue losartan -hydrochlorothiazide  50-12.5mg  daily at home.  -11/16/24 BPs stable and in range  Vitals:   11/14/24 1530 11/14/24 2001 11/15/24 0507 11/15/24 1441  BP: 122/60 (!) 118/54 (!) 129/52 ROLLEN)  108/54   11/15/24 2004 11/16/24 0504 11/16/24 0811 11/16/24 2022  BP: (!) 128/55 (!) 120/53 (!) 113/56 (!) 123/52   11/17/24 0512 11/17/24 1641 11/17/24 2020 11/18/24 0413  BP: (!) 120/57 (!) 111/57 (!) 123/56 (!) 120/59      10.OSA: Continue CPAP nightly.    11. Hyponatremia: resolved   12. HLD: Simvastatin  40mg  nightly 11/06/24 mild transaminitis at admission here, ALT now normal and AST as well as Alk P  only mildly elevated , remains on simvastatin  , CT abd/pelvis 11/02/24 unremarkable  13. Bowel management: colace 100mg  daily only - miralax  BID, senokot 2 tabs BID changed to PRN -LBM 12/27  14. Urinary urgency/dribbling: -UA neg, likely BPH, no current c/os, occ incont   LOS: 14 days A FACE TO FACE EVALUATION WAS PERFORMED  Duane Greene 11/18/2024, 10:14 AM     "

## 2024-11-18 NOTE — Plan of Care (Signed)
  Problem: RH Balance Goal: LTG Patient will maintain dynamic standing with ADLs (OT) Description: LTG:  Patient will maintain dynamic standing balance with assist during activities of daily living (OT)  Outcome: Completed/Met   Problem: Sit to Stand Goal: LTG:  Patient will perform sit to stand in prep for activites of daily living with assistance level (OT) Description: LTG:  Patient will perform sit to stand in prep for activites of daily living with assistance level (OT) Outcome: Completed/Met   Problem: RH Bathing Goal: LTG Patient will bathe all body parts with assist levels (OT) Description: LTG: Patient will bathe all body parts with assist levels (OT) Outcome: Completed/Met   Problem: RH Dressing Goal: LTG Patient will perform lower body dressing w/assist (OT) Description: LTG: Patient will perform lower body dressing with assist, with/without cues in positioning using equipment (OT) Outcome: Completed/Met   Problem: RH Toileting Goal: LTG Patient will perform toileting task (3/3 steps) with assistance level (OT) Description: LTG: Patient will perform toileting task (3/3 steps) with assistance level (OT)  Outcome: Completed/Met   Problem: RH Toilet Transfers Goal: LTG Patient will perform toilet transfers w/assist (OT) Description: LTG: Patient will perform toilet transfers with assist, with/without cues using equipment (OT) Outcome: Completed/Met   Problem: RH Tub/Shower Transfers Goal: LTG Patient will perform tub/shower transfers w/assist (OT) Description: LTG: Patient will perform tub/shower transfers with assist, with/without cues using equipment (OT) Outcome: Completed/Met   

## 2024-11-18 NOTE — Progress Notes (Signed)
 Inpatient Rehabilitation Care Coordinator Discharge Note   Patient Details  Name: Duane Greene MRN: [id number] Date of Birth: [birth year]   Discharge location: D/c to home  Length of Stay: 14 days  Discharge activity level: Supervision to CGA  Home/community participation: Limited  Patient response un:Yzjouy Literacy - How often do you need to have someone help you when you read instructions, pamphlets, or other written material from your doctor or pharmacy?: Never  Patient response un:Dnrpjo Isolation - How often do you feel lonely or isolated from those around you?: Never  Services provided included: MD, RD, PT, OT, RN, TR, Pharmacy, Neuropsych, SW, CM  Financial Services:  Field Seismologist Utilized: Banker  Choices offered to/list presented to: patient and wife  Follow-up services arranged:  Home Health, DME Home Health Agency: [facility] for HHPT/OT/SN    DME : [facility]; has all DME needs; added transport chair/[facility] [phone]    Patient response to transportation need: Is the patient able to respond to transportation needs?: Yes In the past 12 months, has lack of transportation kept you from medical appointments or from getting medications?: No In the past 12 months, has lack of transportation kept you from meetings, work, or from getting things needed for daily living?: No   Patient/Family verbalized understanding of follow-up arrangements:  Yes  Individual responsible for coordination of the follow-up plan: contact pt wife  Confirmed correct DME delivered: Graeme DELENA Jude 11/18/2024    Comments (or additional information):fam edu completed  Summary of Stay    Date/Time Discharge Planning CSW  11/15/24 9176 Wife stays here with him and is very involved in his care. [facility] is set up for follow up and IV antibiotics. Has DME from past admissions. Holley Herring will need to do education prior to DC  home AAC  11/12/24 1018 Wife stays here with him and is very involved in his care. [facility] is set up for follow up and IV antibiotics. Has DME from past admissions. Holley Herring will need to do education prior to DC home RGD  11/09/24 1406 Home with wife who stays here with him and has attended therapies with him. Have set up [facility] health for therapies and IV antibiotics. Await DME recommendations RGD       Farren Landa A Jude

## 2024-11-18 NOTE — Progress Notes (Addendum)
 Patient ID: Duane Greene, male   DOB: [birth year], 74 y.o.   MRN: [id number]  Family requests handicap placard. Sw waiting on handicap placard to be signed.   SW informed by PT they would like a transport chair because they are unsure on what condition the loaner chair they would receive from a friend.    SW ordered with [employer] [facility] via parachute.    Graeme Jude, MSW, LCSW Office: [phone] Cell: [phone] Fax: [phone]

## 2024-11-18 NOTE — Progress Notes (Signed)
Patient placed himself on his home cpap.

## 2024-11-18 NOTE — Progress Notes (Signed)
 Occupational Therapy Session Note  Patient Details  Name: Duane Greene MRN: [id number] Date of Birth: 01-Jun-1951  Today's Date: 11/18/2024 OT Individual Time: 9049-8954 OT Individual Time Calculation (min): 55 min    Short Term Goals: Week 1:  OT Short Term Goal 1 (Week 1): pt will be able to don pants over feet using reacher if needed with supervision. OT Short Term Goal 1 - Progress (Week 1): Met OT Short Term Goal 2 (Week 1): Pt will be able to rise to stand with min A to CGA to prepare for toileting needs. OT Short Term Goal 2 - Progress (Week 1): Met OT Short Term Goal 3 (Week 1): pt will be able to ambulate 20 ft from bed to toilet with RW with CGA. OT Short Term Goal 3 - Progress (Week 1): Met Week 2:  OT Short Term Goal 1 (Week 2): STG=LTG  Skilled Therapeutic Interventions/Progress Updates:    Pt received in wc ready for therapy with wife present.  Pt wanted to continue to work on activity and standing tolerance.  His wife provided the supervision for pt as he ambulated to gym with RW as therapist followed behind with wc.  Pt needed 1 seated rest break.  Once in gym, pt stood at table for over 7 min to engage in a board game demonstrating improved tolerance.   Sat to rest.  Discussed discharge plans and pt stated he is feeling much stronger today.  Ambulated back to room.  Pt resting in wc with all needs met and ready to go home tomorrow.   Therapy Documentation Precautions:  Precautions Precautions: Fall Restrictions Weight Bearing Restrictions Per Provider Order: Yes RLE Weight Bearing Per Provider Order: Weight bearing as tolerated   Pain: Pain Assessment Pain Scale: 0-10 Pain Score: 2  Pain Location: Hip Pain Intervention(s): Medication (See eMAR)  Therapy/Group: Individual Therapy  Libni Fusaro 11/18/2024, 8:31 AM

## 2024-11-18 NOTE — Progress Notes (Signed)
 Occupational Therapy Session Note  Patient Details  Name: Duane Greene MRN: [id number] Date of Birth: May 02, 1951  Today's Date: 11/18/2024 OT Individual Time: 9258-9163 OT Individual Time Calculation (min): 55 min    Short Term Goals: Week 2:  OT Short Term Goal 1 (Week 2): STG=LTG  Skilled Therapeutic Interventions/Progress Updates:  Pt greeted in bathroom with wife present assisting with toileting tasks, pt agreeable to OT intervention.    Wife reports knee sleeve is too tight, adjusted knee sleeve and donned top straps first. Pt then able to don knee sleeve.   Transfers/bed mobility/functional mobility:  Practiced functional ambulation in apt with pt able to ambulate around furniture with supervision with RW. Pt demonstrated ability to complete supine>sit to flat HOB on L side with supervision, pt does need increased time d/t pain and recommended using foot stool to assist with scooting back in bed prior to transitioning to supine. Pt agreeable.  Pt also able to stand from low recliner with only MINA, pt reports his recliner at home is taller.  Pt also completed simulated shower transfer to walkin shower with pt able to step backwards into shower stall with BUE support from Rw and supervision assist.                  Ended session with pt seated in w/c with all needs within reach and wife present.   Therapy Documentation Precautions:  Precautions Precautions: Fall Restrictions Weight Bearing Restrictions Per Provider Order: Yes RLE Weight Bearing Per Provider Order: Weight bearing as tolerated  Pain: unrated pain reported in RLE, rest breaks provided as neeeded.     Therapy/Group: Individual Therapy  Ronal Gift [facility] 11/18/2024, 12:04 PM

## 2024-11-19 ENCOUNTER — Other Ambulatory Visit (HOSPITAL_COMMUNITY): Payer: Self-pay

## 2024-11-19 DIAGNOSIS — M1711 Unilateral primary osteoarthritis, right knee: Secondary | ICD-10-CM

## 2024-11-19 MED ORDER — GERHARDT'S BUTT CREAM
1.0000 | TOPICAL_CREAM | Freq: Two times a day (BID) | CUTANEOUS | 0 refills | Status: AC
  Filled 2024-11-19: qty 60, 30d supply, fill #0

## 2024-11-19 MED ORDER — GABAPENTIN 100 MG PO CAPS
200.0000 mg | ORAL_CAPSULE | Freq: Three times a day (TID) | ORAL | 0 refills | Status: AC
  Filled 2024-11-19 – 2024-11-20 (×2): qty 180, 30d supply, fill #0

## 2024-11-19 MED ORDER — POLYETHYLENE GLYCOL 3350 17 G PO PACK: 17.0000 g | PACK | Freq: Every day | ORAL | Status: AC | PRN

## 2024-11-19 MED ORDER — SIMVASTATIN 40 MG PO TABS
40.0000 mg | ORAL_TABLET | Freq: Every day | ORAL | 0 refills | Status: AC
  Filled 2024-11-19: qty 30, 30d supply, fill #0

## 2024-11-19 MED ORDER — LIDOCAINE 5 % EX PTCH
1.0000 | MEDICATED_PATCH | CUTANEOUS | 0 refills | Status: AC
  Filled 2024-11-19: qty 30, 30d supply, fill #0

## 2024-11-19 MED ORDER — OXYCODONE HCL 5 MG PO TABS
2.5000 mg | ORAL_TABLET | ORAL | 0 refills | Status: AC | PRN
  Filled 2024-11-19: qty 30, 10d supply, fill #0

## 2024-11-19 MED ORDER — HEPARIN SOD (PORK) LOCK FLUSH 100 UNIT/ML IV SOLN
250.0000 [IU] | INTRAVENOUS | Status: AC | PRN
  Administered 2024-11-19: 250 [IU]

## 2024-11-19 MED ORDER — SENNOSIDES-DOCUSATE SODIUM 8.6-50 MG PO TABS: 2.0000 | ORAL_TABLET | Freq: Two times a day (BID) | ORAL | Status: AC | PRN

## 2024-11-19 MED ORDER — DICLOFENAC SODIUM 1 % EX GEL: 2.0000 g | Freq: Four times a day (QID) | CUTANEOUS | Status: AC

## 2024-11-19 MED ORDER — TRAZODONE HCL 50 MG PO TABS
50.0000 mg | ORAL_TABLET | Freq: Every day | ORAL | 0 refills | Status: AC
  Filled 2024-11-19: qty 30, 30d supply, fill #0

## 2024-11-19 MED ORDER — VITAMIN D 25 MCG (1000 UNIT) PO TABS
1000.0000 [IU] | ORAL_TABLET | Freq: Every day | ORAL | 0 refills | Status: AC
  Filled 2024-11-19: qty 30, 30d supply, fill #0

## 2024-11-19 MED ORDER — DOCUSATE SODIUM 100 MG PO CAPS
100.0000 mg | ORAL_CAPSULE | Freq: Every day | ORAL | 0 refills | Status: AC
  Filled 2024-11-19: qty 10, 10d supply, fill #0

## 2024-11-19 MED ORDER — ACETAMINOPHEN 500 MG PO TABS
1000.0000 mg | ORAL_TABLET | Freq: Three times a day (TID) | ORAL | 0 refills | Status: AC
  Filled 2024-11-19: qty 30, 5d supply, fill #0

## 2024-11-19 MED ORDER — METHOCARBAMOL 500 MG PO TABS
500.0000 mg | ORAL_TABLET | Freq: Three times a day (TID) | ORAL | 0 refills | Status: AC
  Filled 2024-11-19: qty 90, 30d supply, fill #0

## 2024-11-19 NOTE — Progress Notes (Signed)
 Inpatient Rehabilitation Discharge Medication Review by a Pharmacist  A complete drug regimen review was completed for this patient to identify any potential clinically significant medication issues.  High Risk Drug Classes Is patient taking? Indication by Medication  Antipsychotic No   Anticoagulant No   Antibiotic Yes, as an intravenous medication Cefazolin : bacteremia Lamisil : onychomycosis  Opioid Yes OxyIR: pain  Antiplatelet No   Hypoglycemics/insulin No   Vasoactive Medication Yes Losartan /hydrochlorothiazide : HTN  Chemotherapy No   Other Yes Tylenol , Voltaren  gel, Lidoderm : pain Trazodone : sleep Gabapentin : neuropathic pain Robaxin : muscle spasms Simvastatin : HLD Gerhardt's butt cream: barrier cream Docusate, Miralax , senna-docusate: constipation MVI, vit D: supplement     Type of Medication Issue Identified Description of Issue Recommendation(s)  Drug Interaction(s) (clinically significant)     Duplicate Therapy     Allergy     No Medication Administration End Date     Incorrect Dose     Additional Drug Therapy Needed     Significant med changes from prior encounter (inform family/care partners about these prior to discharge). bASA held until resumed by PCP   Other       Clinically significant medication issues were identified that warrant physician communication and completion of prescribed/recommended actions by midnight of the next day:  No   Time spent performing this drug regimen review (minutes):  30   Delon Sax, PharmD, BCPS Clinical Pharmacist 11/19/2024 7:07 AM

## 2024-11-19 NOTE — Discharge Summary (Signed)
 Physician Discharge Summary  Patient ID: Duane Greene MRN: [id number] DOB/AGE: [birth year] 75 y.o.  Admit date: 11/04/2024 Discharge date: 11/19/2024  Discharge Diagnoses:  Principal Problem:   Septic arthritis (HCC) Active Problems:   MSSA bacteremia   Endocarditis of mitral valve   Discharged Condition: stable  Significant Diagnostic Studies: US  EKG SITE RITE Result Date: 11/04/2024 If Site Rite image not attached, placement could not be confirmed due to current cardiac rhythm.  ECHO TEE Result Date: 11/03/2024    TRANSESOPHOGEAL ECHO REPORT   Patient Name:   Duane Greene Date of Exam: 11/03/2024 Medical Rec #:  [id number]      Height:       67.0 in Accession #:    [id number]     Weight:       198.0 lb Date of Birth:  [birth year]      BSA:          2.014 m Patient Age:    73 years       BP:           124/55 mmHg Patient Gender: M              HR:           77 bpm. Exam Location:  Inpatient Procedure: Transesophageal Echo, 3D Echo, Color Doppler and Cardiac Doppler            (Both Spectral and Color Flow Doppler were utilized during            procedure). Indications:     Endocarditis  History:         Patient has prior history of Echocardiogram examinations, most                  recent 11/01/2024. Risk Factors:Hypertension, Dyslipidemia and                  Sleep Apnea.  Sonographer:     Koleen Popper RDCS Referring Phys:  [id number] [facility] Diagnosing Phys: Kardie Tobb DO PROCEDURE: After discussion of the risks and benefits of a TEE, an informed consent was obtained from the patient. The transesophogeal probe was passed without difficulty through the esophogus of the patient. Imaged were obtained with the patient in a left lateral decubitus position. Sedation performed by different physician. The patient was monitored while under deep sedation. Anesthestetic sedation was provided intravenously by Anesthesiology: 111mg  of Propofol . Image quality was good. The patient's  vital signs;  including heart rate, blood pressure, and oxygen saturation; remained stable throughout the procedure. The patient developed no complications during the procedure.  IMPRESSIONS  1. Left ventricular ejection fraction, by estimation, is 55 to 60%. The left ventricle has normal function.  2. Right ventricular systolic function is normal. The right ventricular size is normal.  3. No left atrial/left atrial appendage thrombus was detected. The LAA emptying velocity was 47 cm/s.  4. Very small echodense mobile mass on the ventricular side of the anterior mitral leaflet. The mitral valve is abnormal. Mild mitral valve regurgitation. Mild mitral stenosis.  5. The aortic valve is tricuspid. Aortic valve regurgitation is trivial. No aortic stenosis is present.  6. 3D performed of the mitral valve. Conclusion(s)/Recommendation(s): Findings are concerning for vegetation/infective endocarditis as detailed above. Findings concerning for mitral valve vegetation. FINDINGS  Left Ventricle: Left ventricular ejection fraction, by estimation, is 55 to 60%. The left ventricle has normal function. The left ventricular internal cavity size was normal in size. Right Ventricle: The  right ventricular size is normal. Right ventricular systolic function is normal. Left Atrium: Left atrial size was not well visualized. No left atrial/left atrial appendage thrombus was detected. The LAA emptying velocity was 47 cm/s. Right Atrium: Right atrial size was not well visualized. Pericardium: There is no evidence of pericardial effusion. Presence of epicardial fat layer. Mitral Valve: Very small echodense mobile mass on the ventricular side of the anterior mitral leaflet. The mitral valve is abnormal. Mild mitral valve regurgitation. Mild mitral valve stenosis. Tricuspid Valve: The tricuspid valve is normal in structure. Tricuspid valve regurgitation is not demonstrated. No evidence of tricuspid stenosis. Aortic Valve: The aortic valve is tricuspid.  Aortic valve regurgitation is trivial. No aortic stenosis is present. Pulmonic Valve: The pulmonic valve was not well visualized. Pulmonic valve regurgitation is trivial. No evidence of pulmonic stenosis. Aorta: The aortic root and ascending aorta are structurally normal, with no evidence of dilitation. There is minimal (Grade I) layered plaque. Venous: The left upper pulmonary vein, left lower pulmonary vein, right upper pulmonary vein and right lower pulmonary vein are normal. A normal flow pattern is recorded from the right upper pulmonary vein. IAS/Shunts: No atrial level shunt detected by color flow Doppler. Additional Comments: Spectral Doppler performed.  AORTA Ao Asc diam: 3.20 cm Dub Tobb DO Electronically signed by Dub Huntsman DO Signature Date/Time: 11/03/2024/3:20:41 PM    Final    EP STUDY Result Date: 11/03/2024 See surgical note for result.  CT HEAD WO CONTRAST ( ) Result Date: 11/02/2024 EXAM: CT HEAD WITHOUT CONTRAST 11/02/2024 03:36:33 PM TECHNIQUE: CT of the head was performed without the administration of intravenous contrast. Automated exposure control, iterative reconstruction, and/or weight based adjustment of the mA/kV was utilized to reduce the radiation dose to as low as reasonably achievable. COMPARISON: None available. CLINICAL HISTORY: Waxing and waning confusion FINDINGS: BRAIN AND VENTRICLES: No acute hemorrhage. No evidence of acute infarct. No hydrocephalus. No extra-axial collection. No mass effect or midline shift. Periventricular and subcortical white matter hypoattenuation, consistent with moderate chronic ischemic microvascular disease. Calcified atherosclerotic plaque within cavernous/supraclinoid internal carotid arteries. ORBITS: No acute abnormality. SINUSES: No acute abnormality. SOFT TISSUES AND SKULL: No acute soft tissue abnormality. No skull fracture. IMPRESSION: 1. No acute intracranial abnormality. Electronically signed by: Donnice Mania MD 11/02/2024  09:20 PM EST RP Workstation: [id number]   ECHOCARDIOGRAM COMPLETE Result Date: 11/01/2024    ECHOCARDIOGRAM REPORT   Patient Name:   Duane Greene Date of Exam: 11/01/2024 Medical Rec #:  [id number]      Height:       70.0 in Accession #:    [id number]     Weight:       215.0 lb Date of Birth:  1951-08-08      BSA:          2.152 m Patient Age:    73 years       BP:           105/51 mmHg Patient Gender: M              HR:           65 bpm. Exam Location:  Inpatient Procedure: 2D Echo, Cardiac Doppler and Color Doppler (Both Spectral and Color            Flow Doppler were utilized during procedure). Indications:    Bacteremia R78.81  History:        Patient has prior history of Echocardiogram examinations, most  recent 02/01/2023. Signs/Symptoms:Hypertensive Heart Disease.  Sonographer:    Duane Devonshire Referring Phys: [id number] MICHAEL A MOORE IMPRESSIONS  1. Left ventricular ejection fraction, by estimation, is 55 to 60%. Left ventricular ejection fraction by 2D MOD biplane is 59.5 %. The left ventricle has normal function. The left ventricle has no regional wall motion abnormalities. Left ventricular diastolic parameters were normal.  2. Right ventricular systolic function is normal. The right ventricular size is normal. There is normal pulmonary artery systolic pressure.  3. The mitral valve is normal in structure. No evidence of mitral valve regurgitation. No evidence of mitral stenosis.  4. The aortic valve is normal in structure. Aortic valve regurgitation is not visualized. No aortic stenosis is present. Comparison(s): No significant change from prior study. FINDINGS  Left Ventricle: Left ventricular ejection fraction, by estimation, is 55 to 60%. Left ventricular ejection fraction by 2D MOD biplane is 59.5 %. The left ventricle has normal function. The left ventricle has no regional wall motion abnormalities. The left ventricular internal cavity size was normal in size. There is no left  ventricular hypertrophy. Left ventricular diastolic parameters were normal. Right Ventricle: The right ventricular size is normal. No increase in right ventricular wall thickness. Right ventricular systolic function is normal. There is normal pulmonary artery systolic pressure. The tricuspid regurgitant velocity is 2.71 m/s, and  with an assumed right atrial pressure of 3 mmHg, the estimated right ventricular systolic pressure is 32.4 mmHg. Left Atrium: Left atrial size was normal in size. Right Atrium: Right atrial size was normal in size. Pericardium: There is no evidence of pericardial effusion. Mitral Valve: The mitral valve is normal in structure. No evidence of mitral valve regurgitation. No evidence of mitral valve stenosis. Tricuspid Valve: The tricuspid valve is normal in structure. Tricuspid valve regurgitation is not demonstrated. No evidence of tricuspid stenosis. Aortic Valve: The aortic valve is normal in structure. Aortic valve regurgitation is not visualized. No aortic stenosis is present. Aortic valve mean gradient measures 3.0 mmHg. Aortic valve peak gradient measures 7.0 mmHg. Aortic valve area, by VTI measures 3.03 cm. Pulmonic Valve: The pulmonic valve was not well visualized. Pulmonic valve regurgitation is not visualized. No evidence of pulmonic stenosis. Aorta: The aortic root is normal in size and structure. Venous: The inferior vena cava was not well visualized. IAS/Shunts: The interatrial septum was not well visualized.  LEFT VENTRICLE PLAX 2D                        Biplane EF (MOD) LVIDd:         5.10 cm         LV Biplane EF:   Left LVIDs:         3.40 cm                          ventricular LV PW:         1.00 cm                          ejection LV IVS:        0.90 cm                          fraction by LVOT diam:     2.10 cm  2D MOD LV SV:         80                               biplane is LV SV Index:   37                               59.5 %. LVOT Area:      3.46 cm LV IVRT:       77 msec         Diastology                                LV e' medial:    9.25 cm/s                                LV E/e' medial:  9.7 LV Volumes (MOD)               LV e' lateral:   9.14 cm/s LV vol d, MOD    93.1 ml       LV E/e' lateral: 9.8 A2C: LV vol d, MOD    68.6 ml A4C: LV vol s, MOD    33.9 ml A2C: LV vol s, MOD    29.5 ml A4C: LV SV MOD A2C:   59.2 ml LV SV MOD A4C:   68.6 ml LV SV MOD BP:    47.4 ml RIGHT VENTRICLE RV Basal diam:  3.50 cm     PULMONARY VEINS RV S prime:     17.10 cm/s  Diastolic Velocity: 29.00 cm/s TAPSE (M-mode): 2.7 cm      S/D Velocity:       1.60                             Systolic Velocity:  45.30 cm/s LEFT ATRIUM             Index        RIGHT ATRIUM           Index LA diam:        3.80 cm 1.77 cm/m   RA Area:     17.70 cm LA Vol (A2C):   44.5 ml 20.67 ml/m  RA Volume:   48.80 ml  22.67 ml/m LA Vol (A4C):   37.2 ml 17.28 ml/m LA Biplane Vol: 40.7 ml 18.91 ml/m  AORTIC VALVE AV Area (Vmax):    2.91 cm AV Area (Vmean):   3.16 cm AV Area (VTI):     3.03 cm AV Vmax:           132.00 cm/s AV Vmean:          78.000 cm/s AV VTI:            0.264 m AV Peak Grad:      7.0 mmHg AV Mean Grad:      3.0 mmHg LVOT Vmax:         111.00 cm/s LVOT Vmean:        71.200 cm/s LVOT VTI:          0.231 m LVOT/AV VTI ratio: 0.88  AORTA Ao Root diam: 3.50 cm Ao Asc diam:  2.90 cm MITRAL  VALVE               TRICUSPID VALVE MV Area (PHT): 3.34 cm    TR Peak grad:   29.4 mmHg MV Decel Time: 227 msec    TR Vmax:        271.00 cm/s MV E velocity: 89.50 cm/s MV A velocity: 93.40 cm/s  SHUNTS MV E/A ratio:  0.96        Systemic VTI:  0.23 m                            Systemic Diam: 2.10 cm Joelle Cedars Tonleu Electronically signed by Joelle Cedars Tonleu Signature Date/Time: 11/01/2024/10:35:36 AM    Final    DG FL ASP/INJ MAJOR (SHOULDER, HIP, KNEE) Addendum Date: 10/30/2024 ADDENDUM REPORT: 10/30/2024 12:37 ADDENDUM: CLINICAL DATA: 74 year old male with right hip  pain, concerning for septic arthritis. IR requested for diagnostic right hip intra-articular aspiration. Electronically Signed   By: Ester Sides M.D.   On: 10/30/2024 12:37   Result Date: 10/30/2024 CLINICAL DATA:  74 year old male with right hip pain, concerning for septic arthritis. IR requested for diagnostic right hip intra-articular aspiration. EXAM: RIGHT HIP INJECTION AND ASPIRATION UNDER FLUOROSCOPY COMPARISON:  MR Hip Right w w/o cm on 10/29/2024. FLUOROSCOPY: Radiation Exposure Index (as provided by the fluoroscopic device): 7.0 mGy Kerma PROCEDURE: Overlying skin prepped with Betadine, draped in the usual sterile fashion, and infiltrated locally with buffered Lidocaine . Straight 20 gauge spinal needle advanced to the superolateral margin of the right femoral head, with immediate return of turbid, straw color fluid in needle hub, which was aspirated. Diagnostic injection of iodinated contrast demonstrates intra-articular spread without intravascular component. Subsequent aspiration of fluid was attempted post contrast injection, with total of 1 mL of fluid aspirated. Needle was then removed.  No immediate complication. IMPRESSION: Technically successful right hip injection and aspiration under fluoroscopy. Procedure performed by: Carlin Griffon, PA-C. Electronically Signed: By: Ester Sides M.D. On: 10/30/2024 12:34   MR HIP RIGHT W WO CONTRAST Result Date: 10/30/2024 CLINICAL DATA:  Chronic right hip pain and groin pain EXAM: MRI OF THE RIGHT HIP WITHOUT AND WITH CONTRAST TECHNIQUE: Multiplanar, multisequence MR imaging was performed both before and after administration of intravenous contrast. CONTRAST:  9mL GADAVIST  GADOBUTROL  1 MMOL/ML IV SOLN COMPARISON:  Hip x-ray 10/26/2024 FINDINGS: Bones: No hip fracture, dislocation or avascular necrosis. No periosteal reaction or bone destruction. No aggressive osseous lesion. Normal sacrum and sacroiliac joints. No SI joint widening or erosive  changes. Lower lumbar spine spondylosis. Articular cartilage and labrum Articular cartilage: High-grade partial is cartilage loss of the right anterosuperior femoral head and acetabulum. Labrum:  Nondisplaced right anterosuperior labral tear. Joint or bursal effusion Joint effusion: Large right hip joint effusion. No left hip joint effusion. No SI joint effusion. Bursae:  No bursal fluid. Muscles and tendons Flexors: Normal. Extensors: Normal. Abductors: Normal. Adductors: Normal. Gluteals: Severe muscle edema in the right gluteus minimus muscle. Mild muscle edema in the abductor musculature and perifascial edema. Small fluid collection in the right gluteus minimus muscle measuring 5.9 x 0.9 x 3.5 cm. Hamstrings: Normal. Other findings No pelvic free fluid. No fluid collection or hematoma. No inguinal lymphadenopathy. No inguinal hernia. IMPRESSION: 1. Large right hip joint effusion with severe muscle edema in the right gluteus minimus muscle and mild muscle edema in the abductor musculature and perifascial edema. Small abscess in the right gluteus minimus muscle measuring 5.9 x 0.9  x 3.5 cm. Findings most concerning for septic arthritis and myositis. 2. High-grade partial is cartilage loss of the right anterosuperior femoral head and acetabulum. 3. Nondisplaced right anterosuperior labral tear. Electronically Signed   By: Julaine Blanch M.D.   On: 10/30/2024 08:15   CT ABDOMEN PELVIS W CONTRAST Result Date: 10/29/2024 EXAM: CT ABDOMEN AND PELVIS WITH CONTRAST 10/29/2024 05:52:43 PM TECHNIQUE: CT of the abdomen and pelvis was performed with the administration of 75 mL of iohexol  (OMNIPAQUE ) 350 MG/ML injection. Multiplanar reformatted images are provided for review. Automated exposure control, iterative reconstruction, and/or weight-based adjustment of the mA/kV was utilized to reduce the radiation dose to as low as reasonably achievable. COMPARISON: 10/01/2019 CLINICAL HISTORY: Sepsis FINDINGS: LOWER CHEST: There  is atelectasis in the lung bases. LIVER: The liver is unremarkable. GALLBLADDER AND BILE DUCTS: Gallbladder surgically absent. No biliary ductal dilatation. SPLEEN: No acute abnormality. PANCREAS: No acute abnormality. ADRENAL GLANDS: No acute abnormality. KIDNEYS, URETERS AND BLADDER: There is a punctate calculus in the inferior pole of the left kidney. There is mild nonspecific bilateral perinephric vascular stranding. There is some scarring in the lateral left kidney. No hydronephrosis. Urinary bladder is unremarkable. GI AND BOWEL: Stomach demonstrates no acute abnormality. There is diffuse colonic diverticulosis. The appendix appears normal. There is no bowel obstruction. PERITONEUM AND RETROPERITONEUM: No ascites. No free air. VASCULATURE: Aorta is normal in caliber. There are atherosclerotic calcifications of the aorta. LYMPH NODES: No lymphadenopathy. REPRODUCTIVE ORGANS: No acute abnormality. BONES AND SOFT TISSUES: Degenerative changes affect the spine. There is no focal osseous lesion. There is a very small fat containing left inguinal hernia seen best on coronal view. IMPRESSION: 1. No acute findings in the abdomen or pelvis. 2. Cholecystectomy without complication. 3. Diffuse colonic diverticulosis without evidence of acute diverticulitis. 4. Aortic atherosclerosis without aneurysm. 5. Nonobstructing punctate left inferior pole renal calculus. 6. Bilateral mild perinephric stranding and left renal cortical scarring, likely chronic. Electronically signed by: Greig Pique MD 10/29/2024 06:52 PM EST RP Workstation: [id number]   DG Hip Unilat W or Wo Pelvis 2-3 Views Right Result Date: 10/26/2024 EXAM: 2 or 3 VIEW(S) XRAY OF THE RIGHT HIP 10/26/2024 07:09:00 AM COMPARISON: None available. CLINICAL HISTORY: pain pain FINDINGS: BONES AND JOINTS: Mild osteoarthritis of the right hip. No acute fracture. No malalignment. No evidence of acute traumatic injury. SOFT TISSUES: Vascular calcifications are present.  LUMBAR SPINE: Chronic degenerative changes within the lower lumbar spine. IMPRESSION: 1. No evidence of acute traumatic injury. 2. Mild osteoarthritis of the right hip. Electronically signed by: Evalene Coho MD 10/26/2024 07:20 AM EST RP Workstation: [id number]    Labs:  Basic Metabolic Panel: Recent Labs  Lab 11/15/24 0416 11/18/24 0412  NA 137 138  K 3.8 3.9  CL 100 101  CO2 29 29  GLUCOSE 106* 97  BUN 25* 29*  CREATININE 0.64 0.67  CALCIUM 8.9 9.1    CBC: Recent Labs  Lab 11/15/24 0416 11/18/24 0412  WBC 8.0 6.8  NEUTROABS 4.9 3.7  HGB 11.8* 11.5*  HCT 34.7* 34.5*  MCV 96.4 96.4  PLT 334 251      Brief HPI:   Duane Greene is a 74 y.o. male  with PMHx of HTN, HLD, OSA on CPAP, pulmonary embolism no longer on anticoagulation, and OA bilateral hips followed by [facility] .  Patient presented to [facility] ED on 10/26/2024 with complaints of right hip and groin pain. He was s/p US  guided steroid injection 12/2.  In the  ED xray consistent for OA, he was discharged after receiving IV Toradol  and instructed to use p.o. oxycodone  and to follow-up with Ortho. Dr. Delane evaluated with MRI for possible avascular necrosis.  At home he became febrile his pain was unbearable.  Patient returned to the ED on 12/13 with temp 100.4 and labs notable for WBC 12.9, lactic acid 2, and CRP 27.6.  UA with rare bacteria and WBC 0-5.  Blood cultures were obtained and showed S. Aureus.  Orthopedic surgery and ID consulted for admission and placed on IV cefazolin .    Patient underwent right hip aspiration on 12/13 in IR and subsequently underwent I&D of the hip joint on 12/14.  Intraoperative cultures showed gram-positive cocci.  ID recommended TTE that showed LVEF of 55 to 60% with no regional wall motion abnormality and no mention of vegetation.  Cardiology consulted, patient then underwent TEE on 11/04/2024 with vegetation on the mitral valve.  Blood culture from 10/31/2024 have been  negative for 4 days. ID continues to follow for course of treatment.    Prior to arrival he was active and independent, driving in the community with use of DME. He lives with his spouse in a once level home with 3 stairs to enter. He currently requires moderate assistance, +2 for physical assistance, +2 for safety/equipment with ADLs  and bed mobility. Min assist to transfer with RW.  Therapy evaluations completed due to patient decreased functional mobility was admitted for a comprehensive rehab program.    Inpatient Rehabilitation Course: Duane Greene was admitted to rehab 11/04/2024 for inpatient therapies to consist of PT, ST and OT at least three hours five days a week. Past admission physiatrist, therapy team and rehab RN have worked together to provide customized collaborative inpatient rehab.   -Pain Management: Tylenol  500 mg BID and Oxycodone  2.5 mg q4 hr as needed. Diclofenac  gel for right knee pain d/t knee orthosis.   -Mood/Behavior/Sleep: Trazodone  prn   -Skin/Wound Care: Onychomyosis Terbinafine  250mg  weekly for fungal infx- toenail hygiene per DPM. Wound care/signs and symptoms of infection discussed at discharge.    -Right hip septic arthritis/MSSA bactermia: Afebrile, blood cultures neg from 12/14.  Per ID continue IV cefazolin  course EOT 12/12/2024. PICC line to be placed in CIR 12/18. Discharge instructions for management of access given. Appreciate OPAT RN guidance on IV antibiotics and home health orders.    -OSA: Continue CPAP nightly.     -Hypertension: Continue losartan -hydrochlorothiazide  50-12.5mg  daily at home.   -Hyperlipidemia: Simvastatin  40mg  nightly. ALT and AST mildly elevated. CT abd/pelvis completed, unremarkable.    -Urinary urgency: resolved.   -Bowel management: miralax  BID, senokot 2 tabs BID changed to PRN    Planned Outpatient Follow-Up:  -Orthopedic Surgery -PCP -PM&R -Podiatry      Rehab course: During patient's stay in rehab weekly  team conferences were held to monitor patient's progress, set goals and discuss barriers to discharge. At admission, patient required moderate assistance, +2 for physical assistance, +2 for safety/equipment with ADLs  and bed mobility. Min assist to transfer with RW.    Occupational Therapy: Patient has met ALL long term goals due to improved activity tolerance, improved balance, and ability to compensate for deficits.   Patient to discharge at overall supervision level, with good understanding and adherence to precautions. Patient's care partner is independent to provide the necessary physical assistance at discharge.  He will benefit from ongoing OT in home health setting to continue to advance functional skills in the area of  BADL.   Physical Therapy: Patient has met ALL long term goals due to improved activity tolerance, improved balance, improved postural control, increased strength, decreased pain, ability to compensate for deficits, improved attention, improved awareness, and improved coordination.  Patient to discharge at an Supervision level.   He will benefit from ongoing skilled PT services in home health setting to continue to advance safe functional mobility, address ongoing impairments in strength, ROM, balance, endurance, gait, and minimize fall risk.    Discharge plan was discussed with patient and wife and they verbalized understanding and agreed with it.      Disposition: Discharge disposition: 06-Home-Health Care Svc        Diet: Regular   Special Instructions:  -IV antibiotic infusion instructions provided. See below.   -No driving or operating heavy machinery until cleared by provider  -No smoking or alcohol or illicit drug use    Discharge Instructions     [facility] pharmacist to adjust dose for Vancomycin , Aminoglycosides and other anti-infective therapies as requested by physician.   Complete by: As directed    [facility] to provide  Cath Flo 2mg    Complete by: As directed    Administer for PICC line occlusion and as ordered by physician for other access device issues.   Anaphylaxis Kit: Provided to treat any anaphylactic reaction to the medication being provided to the patient if First Dose or when requested by physician   Complete by: As directed    Epinephrine  1mg /ml vial / amp: Administer 0.3mg  (0.87ml) subcutaneously once for moderate to severe anaphylaxis, nurse to call physician and pharmacy when reaction occurs and call 911 if needed for immediate care   Diphenhydramine  50mg /ml IV vial: Administer 25-50mg  IV/IM PRN for first dose reaction, rash, itching, mild reaction, nurse to call physician and pharmacy when reaction occurs   Sodium Chloride  0.9% NS 500ml IV: Administer if needed for hypovolemic blood pressure drop or as ordered by physician after call to physician with anaphylactic reaction   Change dressing on IV access line weekly and PRN   Complete by: As directed    Flush IV access with Sodium Chloride  0.9% and Heparin  10 units/ml or 100 units/ml   Complete by: As directed    Home infusion instructions - [facility]   Complete by: As directed    Instructions: Flush IV access with Sodium Chloride  0.9% and Heparin  10units/ml or 100units/ml   Change dressing on IV access line: Weekly and PRN   Instructions Cath Flo 2mg : Administer for PICC Line occlusion and as ordered by physician for other access device   [facility] pharmacist to adjust dose for: Vancomycin , Aminoglycosides and other anti-infective therapies as requested by physician   Method of administration may be changed at the discretion of home infusion pharmacist based upon assessment of the patient and/or caregivers ability to self-administer the medication ordered   Complete by: As directed       Allergies as of 11/19/2024   No Known Allergies      Medication List     PAUSE taking these medications    aspirin  EC 81 MG  tablet Wait to take this until your doctor or other care provider tells you to start again. Take 81 mg by mouth at bedtime. Swallow whole.       STOP taking these medications    enoxaparin  40 MG/0.4ML injection Commonly known as: LOVENOX    triamcinolone  cream 0.1 % Commonly known as: KENALOG   TAKE these medications    acetaminophen  500 MG tablet Commonly known as: TYLENOL  Take 2 tablets (1,000 mg total) by mouth 3 (three) times daily.   ceFAZolin  IVPB Commonly known as: ANCEF  Inject 2 g into the vein every 8 (eight) hours. 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   cholecalciferol  25 MCG (1000 UNIT) tablet Commonly known as: VITAMIN D3 Take 1 tablet (1,000 Units total) by mouth daily.   diclofenac  Sodium 1 % Gel Commonly known as: VOLTAREN  Apply 2 g topically 4 (four) times daily.   docusate sodium  100 MG capsule Commonly known as: COLACE Take 1 capsule (100 mg total) by mouth daily. Start taking on: November 20, 2024   gabapentin  100 MG capsule Commonly known as: NEURONTIN  Take 2 capsules (200 mg total) by mouth 3 (three) times daily. What changed:  medication strength how much to take   lidocaine  5 % Commonly known as: LIDODERM  Place 1 patch onto the skin daily. Remove & Discard patch within 12 hours or as directed by MD   losartan -hydrochlorothiazide  50-12.5 MG tablet Commonly known as: HYZAAR  Take 1 tablet by mouth daily.   methocarbamol  500 MG tablet Commonly known as: ROBAXIN  Take 1 tablet (500 mg total) by mouth 3 (three) times daily.   multivitamin with minerals tablet Take 1 tablet by mouth at  bedtime.   oxyCODONE  5 MG immediate release tablet Commonly known as: Oxy IR/ROXICODONE  Take 0.5 tablets (2.5 mg total) by mouth every 4 (four) hours as needed for severe pain (pain score 7-10) (.). What changed:  how much to take when to take this reasons to take this   polyethylene glycol 17 g packet Commonly known as: MIRALAX  / GLYCOLAX  Take 17 g by mouth daily as needed for mild constipation. What changed:  when to take this reasons to take this   senna-docusate 8.6-50 MG tablet Commonly known as: Senokot-S Take 2 tablets by mouth every 12 (twelve) hours as needed for moderate constipation. What changed:  when to take this reasons to take this   simvastatin  40 MG tablet Commonly known as: ZOCOR  Take 1 tablet (40 mg total) by mouth at bedtime.   terbinafine  250 MG tablet Commonly known as: LAMISIL  Take 1 tablet (250 mg total) by mouth every 7 (seven) days to prevent onychomycosis. What changed: when to take this   traZODone  50 MG tablet Commonly known as: DESYREL  Take 1 tablet (50 mg total) by mouth at bedtime.               Discharge Care Instructions  (From admission, onward)           Start     Ordered   11/15/24 0000  Change dressing on IV access line weekly and PRN  (Home infusion instructions - [facility] )        11/15/24 0930            Follow-up Information     [name] LABOR, MD Follow up.   Specialty: Internal Medicine Why: Call for an appointment. Contact information: [address] [place] [address] [phone]         Dennise Kingsley,  MD Follow up.   Specialty: Infectious Diseases Why: Appointment 12/02/2024 9:30 am Contact information: [address] [place] [address] [phone]         Georgina Ozell LABOR, MD Follow up.   Specialty: Orthopedic Surgery Why: Appointment 11/23/2024 10:30 am Contact information: [address] [place]  [address] [phone]                 Signed: Daphne LOISE Satterfield 11/19/2024, 9:40 AM

## 2024-11-19 NOTE — Progress Notes (Addendum)
 "                                                        PROGRESS NOTE   Subjective/Complaints:  Feels ready to go, pain well controlled in hip PICC site without pain  Discussed f/u with ID and ortho scheduled Needs to call PCP for f/u   ROS: as per HPI. Denies CP, SOB, abd pain, N/V/D/C, or any other complaints at this time.    Objective:   No results found.  Recent Labs    11/18/24 0412  WBC 6.8  HGB 11.5*  HCT 34.5*  PLT 251    Recent Labs    11/18/24 0412  NA 138  K 3.9  CL 101  CO2 29  GLUCOSE 97  BUN 29*  CREATININE 0.67  CALCIUM 9.1     Intake/Output Summary (Last 24 hours) at 11/19/2024 0854 Last data filed at 11/18/2024 2019 Gross per 24 hour  Intake 358 ml  Output 150 ml  Net 208 ml         Physical Exam: Vital Signs Blood pressure 137/63, pulse 66, temperature 98 F (36.7 C), temperature source Oral, resp. rate 17, height 5' 7 (1.702 m), weight 91.8 kg, SpO2 98%.   General: No acute distress, sitting in w/c working in OT, comfortable appearing.  Mood and affect are appropriate Heart: Regular rate and rhythm no rubs murmurs or extra sounds Lungs: Clear to auscultation, breathing unlabored, no rales or wheezes Abdomen: Positive bowel sounds, soft nontender to palpation, nondistended Extremities: No clubbing, cyanosis, or edema Skin- RIght hip incision well healed no bruising or drainage Right PICC site upper arm non erythematous , no tenderness PRIOR EXAMS: Skin: No evidence of breakdown, no evidence of rash. Incision CDI Neurologic: Cranial nerves II through XII intact, motor strength is 5/5 in bilateral deltoid, bicep, tricep, grip,5/5  Left ,  3-/5 Right hip flexor, 4/5 knee extensors,4  ankle dorsiflexor and plantar flexor Sensory exam normal sensation to light touch and proprioception in bilateral upper and lower extremities  Musculoskeletal:right hip pain still.     Assessment/Plan: 1. Functional deficits due to Right septic hip  arthritis Stable for D/C today F/u PCP in 3-4 weeks F/u ID 12/03/24 F/u Ortho 11/24/23, ortho will need to clear for driving  No PMR f/u needed  See D/C summary See D/C instructions   Care Tool:  Bathing    Body parts bathed by patient: Right arm, Left arm, Chest, Abdomen, Front perineal area, Face, Left upper leg, Right upper leg, Buttocks, Left lower leg, Right lower leg   Body parts bathed by helper: Right lower leg, Left lower leg, Buttocks     Bathing assist Assist Level: Contact Guard/Touching assist     Upper Body Dressing/Undressing Upper body dressing   What is the patient wearing?: Pull over shirt    Upper body assist Assist Level: Independent    Lower Body Dressing/Undressing Lower body dressing      What is the patient wearing?: Underwear/pull up, Pants     Lower body assist Assist for lower body dressing: Contact Guard/Touching assist     Toileting Toileting    Toileting assist Assist for toileting: Supervision/Verbal cueing     Transfers Chair/bed transfer  Transfers assist     Chair/bed transfer assist level: Supervision/Verbal cueing  Locomotion Ambulation   Ambulation assist      Assist level: Supervision/Verbal cueing Assistive device: Walker-rolling Max distance: 150   Walk 10 feet activity   Assist     Assist level: Supervision/Verbal cueing Assistive device: Walker-rolling   Walk 50 feet activity   Assist Walk 50 feet with 2 turns activity did not occur: Safety/medical concerns  Assist level: Supervision/Verbal cueing Assistive device: Walker-rolling    Walk 150 feet activity   Assist Walk 150 feet activity did not occur: Safety/medical concerns  Assist level: Supervision/Verbal cueing Assistive device: Walker-rolling    Walk 10 feet on uneven surface  activity   Assist Walk 10 feet on uneven surfaces activity did not occur: Safety/medical concerns   Assist level: Supervision/Verbal  cueing Assistive device: Walker-rolling   Wheelchair     Assist Is the patient using a wheelchair?: Yes Type of Wheelchair: Manual    Wheelchair assist level: Supervision/Verbal cueing Max wheelchair distance: 150    Wheelchair 50 feet with 2 turns activity    Assist        Assist Level: Supervision/Verbal cueing   Wheelchair 150 feet activity     Assist      Assist Level: Supervision/Verbal cueing   Blood pressure 137/63, pulse 66, temperature 98 F (36.7 C), temperature source Oral, resp. rate 17, height 5' 7 (1.702 m), weight 91.8 kg, SpO2 98%.  Medical Problem List and Plan: 1. Functional deficits secondary to R hip septic arthritis s/p I&D 12/14               -ELOS/Goals:  11/19/24, SPV-- d/c goal 1/2            -Continue CIR therapies including PT, OT    2.  Antithrombotics: -DVT/anticoagulation:  Mechanical: Sequential compression devices, below knee Bilateral lower extremities Pharmaceutical: Lovenox  40mg  daily             -antiplatelet therapy: N/A   3. Pain Management: Tylenol  1000 mg 3 times daily, gabapentin  300 mg 3 times daily, lidocaine  5% patch, Robaxin  500 mg 3 times daily.  PRN: Tylenol  650 mg and Oxycodone  5 mg q4h prn-- changed tylenol  to 500mg  BID PRN to avoid overdosing.   -Change Oxy IR to 2.5 mg q4h prn-- as of 12/27 but has only used 2.5 mg daily in addition to scheduled tylenol  Add diclofenac  gel for RIght knee pain Now has RIght knee orthosis   4. Mood/Behavior/Sleep: LCSW to follow for evaluation and support when available.              -antipsychotic agents: N/A  -trazodone  PRN -11/07/24 pt uses benadryl  at home, will increase trazodone  ot 50mg     5. Neuropsych/cognition: This patient is capable of making decisions on his own behalf.   6. Skin/Wound Care: Routine pressure relief measures. Onychomyosis Terbinafine  250mg  weekly for fungal infx- toenail hygiene per DPM     7. Fluids/Electrolytes/Nutrition: Monitor I&O and  weight. Follow up labs. Continue vitamins/supplements.  -  8. Right hip septic arthritis/MSSA bactermia: Afebrile, blood cultures neg from 12/14.  Per ID continue IV cefazolin  course EOT 12/12/2024. PICC line to be placed in CIR 12/18.    -CBC stable 12/25  9. Hypertension: Monitor BP per protocol. Continue losartan -hydrochlorothiazide  50-12.5mg  daily at home.  -11/16/24 BPs stable and in range  Vitals:   11/15/24 0507 11/15/24 1441 11/15/24 2004 11/16/24 0504  BP: (!) 129/52 (!) 108/54 (!) 128/55 (!) 120/53   11/16/24 0811 11/16/24 2022 11/17/24 0512 11/17/24 1641  BP: ROLLEN)  113/56 (!) 123/52 (!) 120/57 (!) 111/57   11/17/24 2020 11/18/24 0413 11/18/24 2016 11/19/24 0601  BP: (!) 123/56 (!) 120/59 (!) 134/55 137/63      10.OSA: Continue CPAP nightly.    11. Hyponatremia: resolved   12. HLD: Simvastatin  40mg  nightly 11/06/24 mild transaminitis at admission here, ALT now normal and AST as well as Alk P  only mildly elevated , remains on simvastatin  , CT abd/pelvis 11/02/24 unremarkable  13. Bowel management: colace 100mg  daily only - miralax  BID, senokot 2 tabs BID changed to PRN -LBM 12/27  14. Urinary urgency/dribbling: -UA neg, likely BPH, no current c/os, occ incont   LOS: 15 days A FACE TO FACE EVALUATION WAS PERFORMED  Prentice FORBES Compton 11/19/2024, 8:54 AM     "

## 2024-11-20 ENCOUNTER — Other Ambulatory Visit (HOSPITAL_BASED_OUTPATIENT_CLINIC_OR_DEPARTMENT_OTHER): Payer: Self-pay

## 2024-11-20 ENCOUNTER — Other Ambulatory Visit (HOSPITAL_COMMUNITY): Payer: Self-pay

## 2024-11-23 ENCOUNTER — Ambulatory Visit: Admitting: Orthopedic Surgery

## 2024-11-23 DIAGNOSIS — M25551 Pain in right hip: Secondary | ICD-10-CM

## 2024-11-23 NOTE — Progress Notes (Signed)
 Orthopedic Surgery Progress Note     Assessment: Patient is a 74 y.o. male with right hip septic arthritis, right gluteus musculature myositis    S/p incision and drainage of right hip     Plan: -Operative plans complete -He is still receiving ancef  (end date of 1/25) -WBC count has normalized -Will order ESR/CRP at our next visit -Okay to let soap/water run over incision but do not submerge -Weight bearing status: as tolerated  -Continue to work on further ambulation and more activity.  Explained this will be a longer recovery process since he was essentially bedbound for a week prior to presentation to the hospital and was doing less activity than normal even prior to that -Pain control - Patient should follow-up in the office in approximately 6 weeks, x-rays at next visit: AP/lateral right hip   ___________________________________________________________________________   Subjective: Patient has noticed continued improvement in his pain.  He has been able to ambulate longer distances.  He is still ambulating with a walker.  He states that walking from the car to the office appointment is the longest distance he has done so far.  He is rarely using any medications to control pain.  He did take a half a oxycodone  last night as he was having pain in the right buttock last night.  He has not noticed any redness or drainage around his incision.  He has been leaving the incision open to the air.    Physical Exam:   General: no acute distress, appears stated age Neurologic: alert, answering questions appropriately, following commands Respiratory: unlabored breathing on room air, symmetric chest rise Psychiatric: appropriate affect, normal cadence to speech Skin: Incision over the right hip is well-approximated with no erythema, induration, active/expressible drainage   MSK:      -Right lower extremity             Walking with walker, did sit to stand twice while with me in office, non  antalgic gait, was able to externally rotate to 30 degrees without pain, internal rotation to 5 degrees without pain, flexion to 90 degrees without pain EHL/TA/GSC intact Plantarflexes and dorsiflexes toes Sensation intact to light touch in sural, saphenous, tibial, deep peroneal, and superficial peroneal nerve distributions Foot warm and well perfused     Patient name: Duane Greene Patient MRN: [id number] Date: 11/23/2024

## 2024-11-30 ENCOUNTER — Other Ambulatory Visit: Payer: Self-pay

## 2024-11-30 ENCOUNTER — Ambulatory Visit: Admitting: Infectious Diseases

## 2024-11-30 ENCOUNTER — Encounter: Payer: Self-pay | Admitting: Infectious Diseases

## 2024-11-30 VITALS — BP 127/51 | HR 93 | Temp 97.8°F | Ht 67.0 in | Wt 186.2 lb

## 2024-11-30 DIAGNOSIS — I059 Rheumatic mitral valve disease, unspecified: Secondary | ICD-10-CM | POA: Diagnosis not present

## 2024-11-30 NOTE — Progress Notes (Signed)
" ° °  Subjective:    Patient ID: Duane Greene, male  DOB: Oct 01, 1951, 74 y.o.        MRN: [id number]   HPI 74 y.o. M with pmhx significant for hypertension hyperlipidemia, obstructive sleep apnea on CPAP, and OA bilateral hips s/p US  guided steroid injection on 10/19/24.  Subsequently, patient also saw orthopedics in outpatient MRI was ordered.  Patient then went home but then despite taking medications for pain it was unbearable so presented to the ED.   In the ED 10-30-24, he had low grade temp 100.4 deg F x1.  WBC 12.9-->12.3, lactic acid 2-->1.1, and CRP 27.6.  BCx S. Aureus (pan-sens).  During hospitalization, patient was seen by orthopedics and underwent irrigation and debridement of the right hip for septic arthritis.  Subsequently underwent TEE with findings of mitral valve vegetation.  He was treated with IV ancef  (end date 12-12-24) and was d/c to CIR.  He feels much better today- decreased pain (especially in R hip). Using a walker (for now).  He has blood has been done every Monday.  He was d/c from CIR 11-19-24.  Saw ortho 11-23-24: incision doing well.  No problems with PIC, dressings changed yesterday.    Health Maintenance  Topic Date Due   Hepatitis C Screening  Never done   DTaP/Tdap/Td (1 - Tdap) Never done   Zoster Vaccines- Shingrix (1 of 2) 12/14/1968   Colonoscopy  Never done   COVID-19 Vaccine (4 - 2025-26 season) 07/19/2024   Medicare Annual Wellness (AWV)  10/12/2024   Pneumococcal Vaccine: 50+ Years  Completed   Influenza Vaccine  Completed   Meningococcal B Vaccine  Aged Out      Review of Systems  Constitutional:  Negative for chills, fever and weight loss.  Musculoskeletal:  Positive for joint pain.    Please see HPI. All other systems reviewed and negative.     Objective:  Physical Exam Vitals reviewed.  Constitutional:      General: He is not in acute distress.    Appearance: Normal appearance.  HENT:     Mouth/Throat:     Mouth: Mucous  membranes are moist.     Pharynx: No oropharyngeal exudate.  Eyes:     Extraocular Movements: Extraocular movements intact.     Pupils: Pupils are equal, round, and reactive to light.  Cardiovascular:     Rate and Rhythm: Normal rate and regular rhythm.     Heart sounds: No murmur heard. Pulmonary:     Effort: Pulmonary effort is normal.     Breath sounds: Normal breath sounds.  Abdominal:     General: Bowel sounds are normal. There is no distension.     Palpations: Abdomen is soft.     Tenderness: There is no abdominal tenderness.  Musculoskeletal:        General: Normal range of motion.     Cervical back: Normal range of motion. No rigidity.     Right lower leg: No edema.     Left lower leg: No edema.     Comments: RUE PIC is clean, no erythema, non-tender.   Lymphadenopathy:     Cervical: No cervical adenopathy.  Neurological:     General: No focal deficit present.     Mental Status: He is alert.           Assessment & Plan:  "

## 2024-11-30 NOTE — Addendum Note (Signed)
 Addended by: Shaunae Sieloff C on: 11/30/2024 04:56 PM   Modules accepted: Orders

## 2024-11-30 NOTE — Assessment & Plan Note (Addendum)
 Will plan to pull his pic on 12-12-24 [place] Need his labs (sent to [facility]?) He needs repeat TEE to monitor his valve fxn  Will have him see CV Needs repeat BCx 1 week after his pic is pulled to assure clearance Will see him back in 1 month.

## 2024-12-01 ENCOUNTER — Encounter: Payer: Self-pay | Admitting: Infectious Diseases

## 2024-12-02 ENCOUNTER — Inpatient Hospital Stay: Payer: Self-pay | Admitting: Internal Medicine

## 2024-12-07 ENCOUNTER — Telehealth: Payer: Self-pay

## 2024-12-07 NOTE — Telephone Encounter (Unsigned)
 Copied from CRM [id number]. Topic: Clinical - Request for Lab/Test Order >> Dec 07, 2024  2:29 PM Susanna ORN wrote: Reason for CRM: Duane Greene, nurse with [facility], called to get a message to Dr. Eben. She states that he ordered for the patient to have blood cultures done but states he will need to write for the patient to go to [facility] to have the blood drawn because they do not have the bottles for this. For further questions or concerns, please give her a call on [phone] & can leave a message on this secured line.

## 2024-12-07 NOTE — Telephone Encounter (Signed)
 Received voicemail from [place], RN with [facility] stating they received orders from Dr. Loa office for blood cultures. They are not able to do blood cultures as they do not have the supplies.   Called Beth back, provided her with phone number to Dr. Loa office.   Beth [phone]  Duwaine Lowe, BSN, RN

## 2024-12-08 ENCOUNTER — Other Ambulatory Visit: Payer: Self-pay | Admitting: Infectious Diseases

## 2024-12-08 DIAGNOSIS — I059 Rheumatic mitral valve disease, unspecified: Secondary | ICD-10-CM

## 2024-12-09 NOTE — Telephone Encounter (Signed)
 Called and talked to Duane Greene who stated Dr Eben had called her last night to discuss PICC line and blood cx's. I talked to the Duane Greene who stated he already has an appt to see Dr Eben on 12/21/24. I will ask Dr Eben if he wants blood cxs on this day or Monday 12/20/24.

## 2024-12-10 NOTE — Telephone Encounter (Signed)
 Dr Eben stated blood cxs can be done the day of the appt.; pt was called and informed.

## 2024-12-21 ENCOUNTER — Encounter: Payer: Self-pay | Admitting: Cardiology

## 2024-12-21 ENCOUNTER — Ambulatory Visit: Admitting: Cardiology

## 2024-12-21 ENCOUNTER — Ambulatory Visit: Payer: Self-pay | Admitting: Infectious Diseases

## 2024-12-21 VITALS — BP 130/82 | HR 75 | Ht 69.0 in | Wt 187.2 lb

## 2024-12-21 VITALS — BP 138/72 | HR 70 | Temp 97.8°F | Ht 68.0 in

## 2024-12-21 DIAGNOSIS — I059 Rheumatic mitral valve disease, unspecified: Secondary | ICD-10-CM

## 2024-12-21 DIAGNOSIS — R7881 Bacteremia: Secondary | ICD-10-CM | POA: Diagnosis not present

## 2024-12-21 DIAGNOSIS — B9561 Methicillin susceptible Staphylococcus aureus infection as the cause of diseases classified elsewhere: Secondary | ICD-10-CM | POA: Diagnosis not present

## 2024-12-21 DIAGNOSIS — Z01818 Encounter for other preprocedural examination: Secondary | ICD-10-CM | POA: Diagnosis not present

## 2024-12-21 DIAGNOSIS — M00051 Staphylococcal arthritis, right hip: Secondary | ICD-10-CM

## 2024-12-21 DIAGNOSIS — Z01812 Encounter for preprocedural laboratory examination: Secondary | ICD-10-CM

## 2024-12-21 DIAGNOSIS — E782 Mixed hyperlipidemia: Secondary | ICD-10-CM | POA: Diagnosis not present

## 2024-12-21 DIAGNOSIS — I10 Essential (primary) hypertension: Secondary | ICD-10-CM

## 2024-12-21 DIAGNOSIS — I349 Nonrheumatic mitral valve disorder, unspecified: Secondary | ICD-10-CM

## 2024-12-21 DIAGNOSIS — G4733 Obstructive sleep apnea (adult) (pediatric): Secondary | ICD-10-CM

## 2024-12-21 NOTE — Patient Instructions (Addendum)
 Medication Instructions:  Your physician recommends that you continue on your current medications as directed. Please refer to the Current Medication list given to you today.  *If you need a refill on your cardiac medications before your next appointment, please call your pharmacy*  Lab Work: CBC, CMET, Mag If you have labs (blood work) drawn today and your tests are completely normal, you will receive your results only by: MyChart Message (if you have MyChart) OR A paper copy in the mail If you have any lab test that is abnormal or we need to change your treatment, we will call you to review the results.  Testing/Procedures:     You are scheduled for a TEE (Transesophageal Echocardiogram) on Thursday, March 5 with Dr. Sheena.  Please arrive at the [facility] (Main Entrance A) at [facility]: [address] [place], [place] [address] at 6:30 AM (This time is 1 hour(s) before your procedure to ensure your preparation).   Free valet parking service is available. You will check in at ADMITTING.   *Please Note: You will receive a call the day before your procedure to confirm the appointment time. That time may have changed from the original time based on the schedule for that day.*    DIET:  Nothing to eat or drink after midnight except a sip of water with medications (see medication instructions below)  MEDICATION INSTRUCTIONS: !!IF ANY NEW MEDICATIONS ARE STARTED AFTER TODAY, PLEASE NOTIFY YOUR PROVIDER AS SOON AS POSSIBLE!!  FYI: Medications such as Semaglutide (Ozempic, Wegovy), Tirzepatide (Mounjaro, Zepbound), Dulaglutide (Trulicity), etc (GLP1 agonists) AND Canagliflozin (Invokana), Dapagliflozin (Farxiga), Empagliflozin (Jardiance), Ertugliflozin (Steglatro), Bexagliflozin [employer]) or any combination with one of these drugs such as Invokamet (Canagliflozin/Metformin), Synjardy (Empagliflozin/Metformin), etc (SGLT2 inhibitors) must be held around the time of a procedure.  This is not a comprehensive list of all of these drugs. Please review all of your medications and talk to your provider if you take any one of these. If you are not sure, ask your provider.   LABS:   Come to the lab at the [facility] ([address] [place], 1st Floor) between the hours of 8:00 am and 4:30 pm. You do NOT have to be fasting.  FYI:  For your safety, and to allow us  to monitor your vital signs accurately during the surgery/procedure we request: If you have artificial nails, gel coating, SNS etc, please have those removed prior to your surgery/procedure. Not having the nail coverings /polish removed may result in cancellation or delay of your surgery/procedure.  Your support person will be asked to wait in the waiting room during your procedure.  It is OK to have someone drop you off and come back when you are ready to be discharged.  You cannot drive after the procedure and will need someone to drive you home.  Bring your insurance cards.  *Special Note: Every effort is made to have your procedure done on time. Occasionally there are emergencies that occur at the hospital that may cause delays. Please be patient if a delay does occur.   Follow-Up: At [facility], you and your health needs are our priority.  As part of our continuing mission to provide you with exceptional heart care, our providers are all part of one team.  This team includes your primary Cardiologist (physician) and Advanced Practice Providers or APPs (Physician Assistants and Nurse Practitioners) who all work together to provide you with the care  you need, when you need it.  Your next appointment:   6 month(s)  Provider:   Kardie Tobb, DO

## 2024-12-21 NOTE — Addendum Note (Signed)
 Addended by: ANTONE DWAYNE SAILOR on: 12/21/2024 03:44 PM   Modules accepted: Orders

## 2024-12-22 LAB — CBC
Hematocrit: 39.2 % (ref 37.5–51.0)
Hemoglobin: 13.3 g/dL (ref 13.0–17.7)
MCH: 32.4 pg (ref 26.6–33.0)
MCHC: 33.9 g/dL (ref 31.5–35.7)
MCV: 95 fL (ref 79–97)
Platelets: 241 10*3/uL (ref 150–450)
RBC: 4.11 x10E6/uL — ABNORMAL LOW (ref 4.14–5.80)
RDW: 13.6 % (ref 11.6–15.4)
WBC: 6.5 10*3/uL (ref 3.4–10.8)

## 2024-12-22 LAB — C-REACTIVE PROTEIN: CRP: 10 mg/L (ref 0–10)

## 2024-12-22 LAB — SEDIMENTATION RATE: Sed Rate: 34 mm/h — ABNORMAL HIGH (ref 0–30)

## 2024-12-22 LAB — BASIC METABOLIC PANEL WITH GFR
BUN/Creatinine Ratio: 24 (ref 10–24)
BUN: 21 mg/dL (ref 8–27)
CO2: 24 mmol/L (ref 20–29)
Calcium: 9.4 mg/dL (ref 8.6–10.2)
Chloride: 102 mmol/L (ref 96–106)
Creatinine, Ser: 0.89 mg/dL (ref 0.76–1.27)
Glucose: 92 mg/dL (ref 70–99)
Potassium: 3.7 mmol/L (ref 3.5–5.2)
Sodium: 142 mmol/L (ref 134–144)
eGFR: 89 mL/min/{1.73_m2}

## 2024-12-22 LAB — MAGNESIUM: Magnesium: 2 mg/dL (ref 1.6–2.3)

## 2024-12-23 ENCOUNTER — Telehealth: Payer: Self-pay | Admitting: Infectious Diseases

## 2024-12-23 ENCOUNTER — Other Ambulatory Visit (HOSPITAL_BASED_OUTPATIENT_CLINIC_OR_DEPARTMENT_OTHER): Payer: Self-pay

## 2024-12-23 DIAGNOSIS — M00051 Staphylococcal arthritis, right hip: Secondary | ICD-10-CM

## 2024-12-23 DIAGNOSIS — R7881 Bacteremia: Secondary | ICD-10-CM

## 2024-12-23 MED ORDER — CEPHALEXIN 500 MG PO CAPS
500.0000 mg | ORAL_CAPSULE | Freq: Two times a day (BID) | ORAL | 3 refills | Status: AC
  Filled 2024-12-23: qty 180, 90d supply, fill #0

## 2024-12-23 NOTE — Telephone Encounter (Signed)
 Called pt and let him know his CRP was better however his ESR remained up. 6-12 months of keflex  500mg  bid.   Will put him on prolonged po due to the presence of a retained prosthetic hip.  His repeat BCx from 2-3 are ngtd preliminary We discussed that I will call him when these are final/if they are positive.

## 2024-12-24 LAB — CULTURE, BLOOD (SINGLE)

## 2024-12-27 ENCOUNTER — Encounter: Admitting: Orthopedic Surgery

## 2025-01-20 ENCOUNTER — Ambulatory Visit (HOSPITAL_COMMUNITY): Admit: 2025-01-20 | Admitting: Cardiology

## 2025-01-20 ENCOUNTER — Encounter (HOSPITAL_COMMUNITY): Payer: Self-pay
